# Patient Record
Sex: Male | Born: 1935
Health system: Southern US, Community
[De-identification: ages and names within clinical notes are randomized; demographics above are authoritative.]

## PROBLEM LIST (undated history)

## (undated) DIAGNOSIS — I495 Sick sinus syndrome: Secondary | ICD-10-CM

## (undated) DIAGNOSIS — R112 Nausea with vomiting, unspecified: Secondary | ICD-10-CM

## (undated) DIAGNOSIS — L02219 Cutaneous abscess of trunk, unspecified: Secondary | ICD-10-CM

## (undated) DIAGNOSIS — I10 Essential (primary) hypertension: Secondary | ICD-10-CM

## (undated) DIAGNOSIS — M1990 Unspecified osteoarthritis, unspecified site: Secondary | ICD-10-CM

## (undated) DIAGNOSIS — J309 Allergic rhinitis, unspecified: Secondary | ICD-10-CM

## (undated) DIAGNOSIS — K219 Gastro-esophageal reflux disease without esophagitis: Secondary | ICD-10-CM

## (undated) DIAGNOSIS — I4821 Permanent atrial fibrillation: Secondary | ICD-10-CM

## (undated) DIAGNOSIS — Z9889 Other specified postprocedural states: Secondary | ICD-10-CM

## (undated) DIAGNOSIS — D126 Benign neoplasm of colon, unspecified: Secondary | ICD-10-CM

## (undated) DIAGNOSIS — K5790 Diverticulosis of intestine, part unspecified, without perforation or abscess without bleeding: Secondary | ICD-10-CM

## (undated) DIAGNOSIS — E119 Type 2 diabetes mellitus without complications: Secondary | ICD-10-CM

## (undated) DIAGNOSIS — I2510 Atherosclerotic heart disease of native coronary artery without angina pectoris: Secondary | ICD-10-CM

## (undated) DIAGNOSIS — C4490 Unspecified malignant neoplasm of skin, unspecified: Secondary | ICD-10-CM

## (undated) DIAGNOSIS — E785 Hyperlipidemia, unspecified: Secondary | ICD-10-CM

## (undated) DIAGNOSIS — L03319 Cellulitis of trunk, unspecified: Secondary | ICD-10-CM

## (undated) DIAGNOSIS — I499 Cardiac arrhythmia, unspecified: Secondary | ICD-10-CM

## (undated) DIAGNOSIS — Z950 Presence of cardiac pacemaker: Secondary | ICD-10-CM

## (undated) DIAGNOSIS — L570 Actinic keratosis: Secondary | ICD-10-CM

## (undated) HISTORY — DX: Cutaneous abscess of trunk, unspecified: L02.219

## (undated) HISTORY — DX: Benign neoplasm of colon, unspecified: D12.6

## (undated) HISTORY — DX: Diverticulosis of intestine, part unspecified, without perforation or abscess without bleeding: K57.90

## (undated) HISTORY — DX: Type 2 diabetes mellitus without complications: E11.9

## (undated) HISTORY — DX: Permanent atrial fibrillation: I48.21

## (undated) HISTORY — DX: Allergic rhinitis, unspecified: J30.9

## (undated) HISTORY — DX: Essential (primary) hypertension: I10

## (undated) HISTORY — DX: Atherosclerotic heart disease of native coronary artery without angina pectoris: I25.10

## (undated) HISTORY — DX: Actinic keratosis: L57.0

## (undated) HISTORY — DX: Sick sinus syndrome: I49.5

## (undated) HISTORY — DX: Hyperlipidemia, unspecified: E78.5

## (undated) HISTORY — DX: Unspecified osteoarthritis, unspecified site: M19.90

## (undated) HISTORY — DX: Unspecified malignant neoplasm of skin, unspecified: C44.90

## (undated) HISTORY — PX: CYSTECTOMY: SUR359

## (undated) HISTORY — DX: Cellulitis of trunk, unspecified: L03.319

---

## 1997-11-15 ENCOUNTER — Encounter: Admission: RE | Admit: 1997-11-15 | Discharge: 1998-02-13 | Payer: Self-pay | Admitting: Neurosurgery

## 1998-06-04 HISTORY — PX: BACK SURGERY: SHX140

## 2002-11-12 ENCOUNTER — Encounter: Payer: Self-pay | Admitting: Cardiology

## 2002-11-12 ENCOUNTER — Ambulatory Visit (HOSPITAL_COMMUNITY): Admission: RE | Admit: 2002-11-12 | Discharge: 2002-11-13 | Payer: Self-pay | Admitting: Cardiology

## 2004-04-06 ENCOUNTER — Ambulatory Visit: Payer: Self-pay | Admitting: Internal Medicine

## 2004-04-21 ENCOUNTER — Ambulatory Visit: Payer: Self-pay | Admitting: Internal Medicine

## 2004-05-11 ENCOUNTER — Ambulatory Visit: Payer: Self-pay | Admitting: Internal Medicine

## 2004-06-07 ENCOUNTER — Ambulatory Visit: Payer: Self-pay | Admitting: Internal Medicine

## 2004-07-10 ENCOUNTER — Ambulatory Visit: Payer: Self-pay | Admitting: Internal Medicine

## 2004-08-03 ENCOUNTER — Ambulatory Visit: Payer: Self-pay | Admitting: Internal Medicine

## 2004-08-21 ENCOUNTER — Ambulatory Visit: Payer: Self-pay | Admitting: Internal Medicine

## 2004-09-21 ENCOUNTER — Ambulatory Visit: Payer: Self-pay | Admitting: Internal Medicine

## 2004-10-19 ENCOUNTER — Ambulatory Visit: Payer: Self-pay | Admitting: Internal Medicine

## 2004-11-20 ENCOUNTER — Ambulatory Visit: Payer: Self-pay | Admitting: Internal Medicine

## 2004-11-29 ENCOUNTER — Ambulatory Visit: Payer: Self-pay | Admitting: Internal Medicine

## 2004-12-19 ENCOUNTER — Ambulatory Visit: Payer: Self-pay | Admitting: Internal Medicine

## 2005-01-19 ENCOUNTER — Ambulatory Visit: Payer: Self-pay | Admitting: Internal Medicine

## 2005-02-12 ENCOUNTER — Ambulatory Visit: Payer: Self-pay | Admitting: Internal Medicine

## 2005-03-14 ENCOUNTER — Ambulatory Visit: Payer: Self-pay | Admitting: Internal Medicine

## 2005-04-16 ENCOUNTER — Ambulatory Visit: Payer: Self-pay | Admitting: Internal Medicine

## 2005-05-15 ENCOUNTER — Ambulatory Visit: Payer: Self-pay | Admitting: Internal Medicine

## 2005-06-18 ENCOUNTER — Ambulatory Visit: Payer: Self-pay | Admitting: Internal Medicine

## 2005-07-20 ENCOUNTER — Ambulatory Visit: Payer: Self-pay | Admitting: Internal Medicine

## 2005-08-22 ENCOUNTER — Ambulatory Visit: Payer: Self-pay | Admitting: Internal Medicine

## 2005-08-31 ENCOUNTER — Ambulatory Visit: Payer: Self-pay | Admitting: Internal Medicine

## 2005-09-18 ENCOUNTER — Ambulatory Visit: Payer: Self-pay | Admitting: Internal Medicine

## 2005-10-18 ENCOUNTER — Ambulatory Visit: Payer: Self-pay | Admitting: Internal Medicine

## 2005-11-05 ENCOUNTER — Ambulatory Visit: Payer: Self-pay | Admitting: Gastroenterology

## 2005-11-14 ENCOUNTER — Encounter (INDEPENDENT_AMBULATORY_CARE_PROVIDER_SITE_OTHER): Payer: Self-pay | Admitting: Specialist

## 2005-11-14 ENCOUNTER — Ambulatory Visit: Payer: Self-pay | Admitting: Gastroenterology

## 2005-11-15 ENCOUNTER — Ambulatory Visit: Payer: Self-pay | Admitting: Internal Medicine

## 2005-11-30 ENCOUNTER — Ambulatory Visit: Payer: Self-pay | Admitting: Internal Medicine

## 2005-12-31 ENCOUNTER — Ambulatory Visit: Payer: Self-pay | Admitting: Internal Medicine

## 2006-01-31 ENCOUNTER — Ambulatory Visit: Payer: Self-pay | Admitting: Internal Medicine

## 2006-02-28 ENCOUNTER — Ambulatory Visit (HOSPITAL_COMMUNITY): Admission: RE | Admit: 2006-02-28 | Discharge: 2006-02-28 | Payer: Self-pay | Admitting: Ophthalmology

## 2006-02-28 ENCOUNTER — Encounter: Payer: Self-pay | Admitting: Vascular Surgery

## 2006-03-04 ENCOUNTER — Ambulatory Visit: Payer: Self-pay | Admitting: Internal Medicine

## 2006-04-04 ENCOUNTER — Ambulatory Visit: Payer: Self-pay | Admitting: Internal Medicine

## 2006-05-30 ENCOUNTER — Ambulatory Visit: Payer: Self-pay | Admitting: Internal Medicine

## 2006-06-04 HISTORY — PX: LEAD REVISION: SHX5945

## 2006-06-04 HISTORY — PX: CORONARY ANGIOPLASTY WITH STENT PLACEMENT: SHX49

## 2006-06-04 HISTORY — PX: PACEMAKER INSERTION: SHX728

## 2006-07-01 ENCOUNTER — Ambulatory Visit: Payer: Self-pay | Admitting: Internal Medicine

## 2006-07-30 ENCOUNTER — Ambulatory Visit: Payer: Self-pay | Admitting: Internal Medicine

## 2006-08-29 ENCOUNTER — Ambulatory Visit: Payer: Self-pay | Admitting: Internal Medicine

## 2006-10-03 ENCOUNTER — Ambulatory Visit: Payer: Self-pay | Admitting: Internal Medicine

## 2006-11-07 ENCOUNTER — Ambulatory Visit: Payer: Self-pay | Admitting: Internal Medicine

## 2006-12-02 DIAGNOSIS — I10 Essential (primary) hypertension: Secondary | ICD-10-CM

## 2006-12-02 DIAGNOSIS — K219 Gastro-esophageal reflux disease without esophagitis: Secondary | ICD-10-CM | POA: Insufficient documentation

## 2006-12-02 DIAGNOSIS — J309 Allergic rhinitis, unspecified: Secondary | ICD-10-CM

## 2006-12-09 ENCOUNTER — Ambulatory Visit: Payer: Self-pay | Admitting: Internal Medicine

## 2007-01-09 ENCOUNTER — Ambulatory Visit: Payer: Self-pay | Admitting: Internal Medicine

## 2007-01-09 LAB — CONVERTED CEMR LAB: Prothrombin Time: 16.8 s

## 2007-02-11 ENCOUNTER — Ambulatory Visit: Payer: Self-pay | Admitting: Internal Medicine

## 2007-02-11 DIAGNOSIS — I4821 Permanent atrial fibrillation: Secondary | ICD-10-CM | POA: Insufficient documentation

## 2007-02-11 DIAGNOSIS — I4891 Unspecified atrial fibrillation: Secondary | ICD-10-CM

## 2007-02-24 ENCOUNTER — Ambulatory Visit: Payer: Self-pay | Admitting: Internal Medicine

## 2007-02-24 ENCOUNTER — Telehealth: Payer: Self-pay | Admitting: *Deleted

## 2007-02-24 DIAGNOSIS — R079 Chest pain, unspecified: Secondary | ICD-10-CM | POA: Insufficient documentation

## 2007-02-24 LAB — CONVERTED CEMR LAB: Prothrombin Time: 21.6 s

## 2007-03-04 ENCOUNTER — Encounter: Payer: Self-pay | Admitting: Internal Medicine

## 2007-03-04 ENCOUNTER — Ambulatory Visit: Payer: Self-pay | Admitting: Cardiology

## 2007-03-04 ENCOUNTER — Inpatient Hospital Stay (HOSPITAL_COMMUNITY): Admission: AD | Admit: 2007-03-04 | Discharge: 2007-03-17 | Payer: Self-pay | Admitting: Cardiology

## 2007-03-04 ENCOUNTER — Ambulatory Visit: Payer: Self-pay

## 2007-03-11 DIAGNOSIS — Z950 Presence of cardiac pacemaker: Secondary | ICD-10-CM | POA: Insufficient documentation

## 2007-03-12 ENCOUNTER — Encounter: Payer: Self-pay | Admitting: Cardiology

## 2007-03-20 ENCOUNTER — Ambulatory Visit: Payer: Self-pay | Admitting: Cardiology

## 2007-03-20 ENCOUNTER — Ambulatory Visit: Payer: Self-pay

## 2007-03-20 LAB — CONVERTED CEMR LAB
CO2: 29 meq/L (ref 19–32)
Chloride: 103 meq/L (ref 96–112)
Creatinine, Ser: 1.3 mg/dL (ref 0.4–1.5)
Eosinophils Absolute: 0.3 10*3/uL (ref 0.0–0.6)
Eosinophils Relative: 2.5 % (ref 0.0–5.0)
GFR calc non Af Amer: 58 mL/min
Glucose, Bld: 116 mg/dL — ABNORMAL HIGH (ref 70–99)
HCT: 36.4 % — ABNORMAL LOW (ref 39.0–52.0)
MCV: 90.5 fL (ref 78.0–100.0)
Neutrophils Relative %: 70.6 % (ref 43.0–77.0)
RBC: 4.03 M/uL — ABNORMAL LOW (ref 4.22–5.81)
Sodium: 139 meq/L (ref 135–145)
WBC: 10.6 10*3/uL — ABNORMAL HIGH (ref 4.5–10.5)

## 2007-03-21 ENCOUNTER — Observation Stay (HOSPITAL_COMMUNITY): Admission: RE | Admit: 2007-03-21 | Discharge: 2007-03-22 | Payer: Self-pay | Admitting: Internal Medicine

## 2007-03-21 ENCOUNTER — Ambulatory Visit: Payer: Self-pay | Admitting: Internal Medicine

## 2007-03-25 ENCOUNTER — Ambulatory Visit: Payer: Self-pay | Admitting: Cardiovascular Disease

## 2007-03-25 ENCOUNTER — Ambulatory Visit: Payer: Self-pay

## 2007-03-25 ENCOUNTER — Encounter: Payer: Self-pay | Admitting: Cardiology

## 2007-03-31 ENCOUNTER — Ambulatory Visit: Payer: Self-pay | Admitting: Cardiology

## 2007-03-31 ENCOUNTER — Encounter: Payer: Self-pay | Admitting: Cardiology

## 2007-04-16 ENCOUNTER — Ambulatory Visit: Payer: Self-pay | Admitting: Cardiology

## 2007-04-30 ENCOUNTER — Ambulatory Visit: Payer: Self-pay

## 2007-04-30 ENCOUNTER — Ambulatory Visit: Payer: Self-pay | Admitting: Cardiology

## 2007-04-30 ENCOUNTER — Encounter: Payer: Self-pay | Admitting: Cardiology

## 2007-05-14 ENCOUNTER — Ambulatory Visit: Payer: Self-pay | Admitting: Cardiovascular Disease

## 2007-05-15 ENCOUNTER — Telehealth: Payer: Self-pay | Admitting: Internal Medicine

## 2007-05-22 ENCOUNTER — Ambulatory Visit: Payer: Self-pay | Admitting: Cardiology

## 2007-06-02 ENCOUNTER — Ambulatory Visit: Payer: Self-pay | Admitting: Cardiology

## 2007-06-12 ENCOUNTER — Ambulatory Visit: Payer: Self-pay | Admitting: Cardiology

## 2007-06-23 ENCOUNTER — Ambulatory Visit: Payer: Self-pay | Admitting: Cardiology

## 2007-06-30 ENCOUNTER — Ambulatory Visit: Payer: Self-pay

## 2007-06-30 ENCOUNTER — Ambulatory Visit: Payer: Self-pay | Admitting: Cardiology

## 2007-06-30 ENCOUNTER — Encounter: Payer: Self-pay | Admitting: Cardiology

## 2007-07-09 ENCOUNTER — Ambulatory Visit: Payer: Self-pay | Admitting: Cardiology

## 2007-07-09 LAB — CONVERTED CEMR LAB
ALT: 21 units/L (ref 0–53)
Alkaline Phosphatase: 67 units/L (ref 39–117)
LDL Cholesterol: 43 mg/dL (ref 0–99)
Total CHOL/HDL Ratio: 3.2
Total Protein: 6.9 g/dL (ref 6.0–8.3)
VLDL: 21 mg/dL (ref 0–40)

## 2007-08-06 ENCOUNTER — Ambulatory Visit: Payer: Self-pay | Admitting: Internal Medicine

## 2007-09-03 ENCOUNTER — Ambulatory Visit: Payer: Self-pay | Admitting: Cardiology

## 2007-09-23 ENCOUNTER — Ambulatory Visit: Payer: Self-pay | Admitting: Cardiology

## 2007-10-01 ENCOUNTER — Ambulatory Visit: Payer: Self-pay | Admitting: Cardiology

## 2007-10-29 ENCOUNTER — Ambulatory Visit: Payer: Self-pay | Admitting: Cardiology

## 2007-11-19 ENCOUNTER — Ambulatory Visit: Payer: Self-pay | Admitting: Cardiology

## 2007-12-10 ENCOUNTER — Ambulatory Visit: Payer: Self-pay | Admitting: Cardiovascular Disease

## 2007-12-22 ENCOUNTER — Telehealth (INDEPENDENT_AMBULATORY_CARE_PROVIDER_SITE_OTHER): Payer: Self-pay | Admitting: *Deleted

## 2007-12-25 ENCOUNTER — Ambulatory Visit: Payer: Self-pay | Admitting: Cardiology

## 2008-01-07 ENCOUNTER — Ambulatory Visit: Payer: Self-pay | Admitting: Internal Medicine

## 2008-02-04 ENCOUNTER — Ambulatory Visit: Payer: Self-pay | Admitting: Cardiology

## 2008-03-08 ENCOUNTER — Ambulatory Visit: Payer: Self-pay | Admitting: Internal Medicine

## 2008-03-10 ENCOUNTER — Ambulatory Visit: Payer: Self-pay | Admitting: Internal Medicine

## 2008-03-10 DIAGNOSIS — E1169 Type 2 diabetes mellitus with other specified complication: Secondary | ICD-10-CM | POA: Insufficient documentation

## 2008-03-10 DIAGNOSIS — E785 Hyperlipidemia, unspecified: Secondary | ICD-10-CM

## 2008-03-10 DIAGNOSIS — T887XXA Unspecified adverse effect of drug or medicament, initial encounter: Secondary | ICD-10-CM

## 2008-03-10 LAB — CONVERTED CEMR LAB
AST: 23 units/L (ref 0–37)
Alkaline Phosphatase: 74 units/L (ref 39–117)
BUN: 14 mg/dL (ref 6–23)
Bilirubin, Direct: 0.2 mg/dL (ref 0.0–0.3)
Chloride: 109 meq/L (ref 96–112)
Eosinophils Absolute: 0.3 10*3/uL (ref 0.0–0.7)
Eosinophils Relative: 3.8 % (ref 0.0–5.0)
GFR calc Af Amer: 85 mL/min
GFR calc non Af Amer: 70 mL/min
HCT: 43.1 % (ref 39.0–52.0)
HDL: 34.5 mg/dL — ABNORMAL LOW (ref >39.0)
MCV: 91.9 fL (ref 78.0–100.0)
Monocytes Absolute: 0.6 10*3/uL (ref 0.1–1.0)
Monocytes Relative: 8.7 % (ref 3.0–12.0)
Neutrophils Relative %: 62.2 % (ref 43.0–77.0)
Platelets: 169 10*3/uL (ref 150–400)
Potassium: 4.6 meq/L (ref 3.5–5.1)
RDW: 12.9 % (ref 11.5–14.6)
Sodium: 148 meq/L — ABNORMAL HIGH (ref 135–145)
Total CHOL/HDL Ratio: 3.3
Triglycerides: 109 mg/dL (ref 0–149)
VLDL: 22 mg/dL (ref 0–40)
WBC: 7.1 10*3/uL (ref 4.5–10.5)

## 2008-03-15 ENCOUNTER — Ambulatory Visit: Payer: Self-pay | Admitting: Cardiology

## 2008-04-07 ENCOUNTER — Ambulatory Visit: Payer: Self-pay | Admitting: Internal Medicine

## 2008-04-07 ENCOUNTER — Ambulatory Visit: Payer: Self-pay | Admitting: Cardiology

## 2008-04-07 ENCOUNTER — Encounter: Payer: Self-pay | Admitting: Internal Medicine

## 2008-04-07 DIAGNOSIS — L919 Hypertrophic disorder of the skin, unspecified: Secondary | ICD-10-CM

## 2008-04-07 DIAGNOSIS — C44300 Unspecified malignant neoplasm of skin of unspecified part of face: Secondary | ICD-10-CM | POA: Insufficient documentation

## 2008-04-07 DIAGNOSIS — L570 Actinic keratosis: Secondary | ICD-10-CM

## 2008-04-07 DIAGNOSIS — Z85828 Personal history of other malignant neoplasm of skin: Secondary | ICD-10-CM

## 2008-04-07 DIAGNOSIS — L909 Atrophic disorder of skin, unspecified: Secondary | ICD-10-CM | POA: Insufficient documentation

## 2008-04-08 ENCOUNTER — Telehealth: Payer: Self-pay | Admitting: Internal Medicine

## 2008-05-05 ENCOUNTER — Ambulatory Visit: Payer: Self-pay | Admitting: Cardiology

## 2008-06-02 ENCOUNTER — Ambulatory Visit: Payer: Self-pay | Admitting: Cardiology

## 2008-06-30 ENCOUNTER — Encounter: Payer: Self-pay | Admitting: Cardiology

## 2008-06-30 ENCOUNTER — Ambulatory Visit: Payer: Self-pay | Admitting: Cardiology

## 2008-07-01 ENCOUNTER — Encounter: Payer: Self-pay | Admitting: Cardiology

## 2008-07-02 ENCOUNTER — Ambulatory Visit: Payer: Self-pay | Admitting: Cardiology

## 2008-07-02 LAB — CONVERTED CEMR LAB
AST: 19 units/L (ref 0–37)
Alkaline Phosphatase: 59 units/L (ref 39–117)
Basophils Relative: 0.2 % (ref 0.0–3.0)
Bilirubin, Direct: 0.2 mg/dL (ref 0.0–0.3)
Eosinophils Absolute: 0.3 10*3/uL (ref 0.0–0.7)
Eosinophils Relative: 3.5 % (ref 0.0–5.0)
HDL: 27.4 mg/dL — ABNORMAL LOW (ref >39.0)
LDL Cholesterol: 49 mg/dL (ref 0–99)
Lymphocytes Relative: 25.7 % (ref 12.0–46.0)
MCV: 91.5 fL (ref 78.0–100.0)
Monocytes Relative: 7.9 % (ref 3.0–12.0)
Neutrophils Relative %: 62.7 % (ref 43.0–77.0)
Platelets: 160 10*3/uL (ref 150–400)
RBC: 4.67 M/uL (ref 4.22–5.81)
Total CHOL/HDL Ratio: 3.6
Total Protein: 6.6 g/dL (ref 6.0–8.3)
VLDL: 23 mg/dL (ref 0–40)
WBC: 7.8 10*3/uL (ref 4.5–10.5)

## 2008-08-02 ENCOUNTER — Ambulatory Visit: Payer: Self-pay | Admitting: Cardiovascular Disease

## 2008-09-01 ENCOUNTER — Ambulatory Visit: Payer: Self-pay | Admitting: Cardiology

## 2008-09-10 ENCOUNTER — Ambulatory Visit: Payer: Self-pay | Admitting: Internal Medicine

## 2008-09-13 LAB — CONVERTED CEMR LAB
HDL: 30.1 mg/dL — ABNORMAL LOW (ref >39.00)
Total CHOL/HDL Ratio: 4

## 2008-09-27 ENCOUNTER — Ambulatory Visit: Payer: Self-pay | Admitting: Cardiology

## 2008-09-27 DIAGNOSIS — I2510 Atherosclerotic heart disease of native coronary artery without angina pectoris: Secondary | ICD-10-CM

## 2008-10-06 ENCOUNTER — Ambulatory Visit: Payer: Self-pay | Admitting: Cardiology

## 2008-11-03 ENCOUNTER — Ambulatory Visit: Payer: Self-pay | Admitting: Cardiology

## 2008-11-03 ENCOUNTER — Encounter: Payer: Self-pay | Admitting: *Deleted

## 2008-11-03 LAB — CONVERTED CEMR LAB
POC INR: 2.2
Protime: 18.1

## 2008-12-01 ENCOUNTER — Ambulatory Visit: Payer: Self-pay | Admitting: Cardiology

## 2008-12-01 LAB — CONVERTED CEMR LAB: POC INR: 3

## 2008-12-08 ENCOUNTER — Encounter: Payer: Self-pay | Admitting: *Deleted

## 2008-12-29 ENCOUNTER — Ambulatory Visit: Payer: Self-pay | Admitting: Cardiovascular Disease

## 2009-01-13 ENCOUNTER — Encounter: Payer: Self-pay | Admitting: Cardiology

## 2009-01-19 ENCOUNTER — Encounter (INDEPENDENT_AMBULATORY_CARE_PROVIDER_SITE_OTHER): Payer: Self-pay | Admitting: *Deleted

## 2009-01-26 ENCOUNTER — Ambulatory Visit: Payer: Self-pay | Admitting: Cardiovascular Disease

## 2009-02-23 ENCOUNTER — Ambulatory Visit: Payer: Self-pay | Admitting: Cardiology

## 2009-02-23 ENCOUNTER — Ambulatory Visit: Payer: Self-pay | Admitting: Cardiovascular Disease

## 2009-02-23 LAB — CONVERTED CEMR LAB: POC INR: 2.6

## 2009-03-16 ENCOUNTER — Ambulatory Visit: Payer: Self-pay | Admitting: Internal Medicine

## 2009-03-24 ENCOUNTER — Ambulatory Visit: Payer: Self-pay | Admitting: Cardiology

## 2009-03-24 ENCOUNTER — Ambulatory Visit: Payer: Self-pay | Admitting: Cardiovascular Disease

## 2009-03-24 LAB — CONVERTED CEMR LAB: INR: 2.7

## 2009-04-21 ENCOUNTER — Ambulatory Visit: Payer: Self-pay | Admitting: Internal Medicine

## 2009-04-21 LAB — CONVERTED CEMR LAB: POC INR: 2.1

## 2009-05-23 ENCOUNTER — Ambulatory Visit: Payer: Self-pay | Admitting: Cardiology

## 2009-05-23 LAB — CONVERTED CEMR LAB: POC INR: 2.3

## 2009-06-20 ENCOUNTER — Ambulatory Visit: Payer: Self-pay | Admitting: Cardiology

## 2009-06-20 ENCOUNTER — Encounter (INDEPENDENT_AMBULATORY_CARE_PROVIDER_SITE_OTHER): Payer: Self-pay | Admitting: Cardiology

## 2009-06-20 LAB — CONVERTED CEMR LAB: POC INR: 2.3

## 2009-07-18 ENCOUNTER — Ambulatory Visit: Payer: Self-pay | Admitting: Internal Medicine

## 2009-07-18 LAB — CONVERTED CEMR LAB: POC INR: 2

## 2009-08-15 ENCOUNTER — Ambulatory Visit: Payer: Self-pay | Admitting: Internal Medicine

## 2009-09-01 ENCOUNTER — Telehealth: Payer: Self-pay | Admitting: Internal Medicine

## 2009-09-05 ENCOUNTER — Ambulatory Visit: Payer: Self-pay | Admitting: Internal Medicine

## 2009-09-05 DIAGNOSIS — B356 Tinea cruris: Secondary | ICD-10-CM | POA: Insufficient documentation

## 2009-09-05 DIAGNOSIS — L02219 Cutaneous abscess of trunk, unspecified: Secondary | ICD-10-CM

## 2009-09-05 DIAGNOSIS — L03319 Cellulitis of trunk, unspecified: Secondary | ICD-10-CM

## 2009-09-12 ENCOUNTER — Ambulatory Visit: Payer: Self-pay | Admitting: Internal Medicine

## 2009-10-13 ENCOUNTER — Ambulatory Visit: Payer: Self-pay | Admitting: Cardiology

## 2009-10-13 ENCOUNTER — Ambulatory Visit: Payer: Self-pay | Admitting: Internal Medicine

## 2009-11-07 ENCOUNTER — Ambulatory Visit: Payer: Self-pay | Admitting: Internal Medicine

## 2009-11-15 ENCOUNTER — Ambulatory Visit: Payer: Self-pay | Admitting: Internal Medicine

## 2009-11-15 ENCOUNTER — Ambulatory Visit: Payer: Self-pay | Admitting: Cardiology

## 2009-11-15 LAB — CONVERTED CEMR LAB: POC INR: 2.3

## 2009-11-29 LAB — CONVERTED CEMR LAB
ALT: 21 units/L (ref 0–53)
Cholesterol: 113 mg/dL (ref 0–200)
HDL: 31.3 mg/dL — ABNORMAL LOW (ref >39.00)
Total Protein: 6.7 g/dL (ref 6.0–8.3)
VLDL: 18.6 mg/dL (ref 0.0–40.0)

## 2009-12-14 ENCOUNTER — Ambulatory Visit: Payer: Self-pay | Admitting: Cardiology

## 2009-12-14 LAB — CONVERTED CEMR LAB: POC INR: 2.6

## 2010-01-11 ENCOUNTER — Ambulatory Visit: Payer: Self-pay | Admitting: Cardiology

## 2010-01-11 LAB — CONVERTED CEMR LAB: POC INR: 2.8

## 2010-02-08 ENCOUNTER — Ambulatory Visit: Payer: Self-pay | Admitting: Cardiovascular Disease

## 2010-03-08 ENCOUNTER — Ambulatory Visit: Payer: Self-pay | Admitting: Cardiovascular Disease

## 2010-03-29 ENCOUNTER — Encounter: Payer: Self-pay | Admitting: Internal Medicine

## 2010-03-29 ENCOUNTER — Ambulatory Visit: Payer: Self-pay | Admitting: Cardiology

## 2010-03-30 ENCOUNTER — Telehealth (INDEPENDENT_AMBULATORY_CARE_PROVIDER_SITE_OTHER): Payer: Self-pay | Admitting: *Deleted

## 2010-04-03 ENCOUNTER — Encounter: Payer: Self-pay | Admitting: Cardiology

## 2010-04-05 ENCOUNTER — Ambulatory Visit: Payer: Self-pay | Admitting: Cardiology

## 2010-04-05 ENCOUNTER — Ambulatory Visit: Payer: Self-pay | Admitting: Internal Medicine

## 2010-05-03 ENCOUNTER — Ambulatory Visit: Payer: Self-pay | Admitting: Cardiology

## 2010-05-26 ENCOUNTER — Ambulatory Visit: Payer: Self-pay | Admitting: Cardiology

## 2010-05-26 ENCOUNTER — Ambulatory Visit: Payer: Self-pay | Admitting: Internal Medicine

## 2010-06-04 HISTORY — PX: CATARACT EXTRACTION W/ INTRAOCULAR LENS  IMPLANT, BILATERAL: SHX1307

## 2010-06-22 ENCOUNTER — Ambulatory Visit: Admission: RE | Admit: 2010-06-22 | Discharge: 2010-06-22 | Payer: Self-pay | Source: Home / Self Care

## 2010-07-03 ENCOUNTER — Telehealth: Payer: Self-pay | Admitting: Internal Medicine

## 2010-07-04 NOTE — Medication Information (Signed)
Summary: rov/eac  Anticoagulant Therapy  Managed by: Bethena Midget, RN, BSN Referring MD: Lewayne Bunting PCP: Stacie Glaze MD Supervising MD: Tenny Craw MD, Gunnar Fusi Indication 1: Atrial Fibrillation (ICD-427.31) Lab Used: [facility] Site: [facility] INR POC 2.1 INR RANGE 2.0-3.0  Dietary changes: no    Health status changes: no    Bleeding/hemorrhagic complications: no    Recent/future hospitalizations: no    Any changes in medication regimen? no    Recent/future dental: no  Any missed doses?: no       Is patient compliant with meds? yes       Allergies (verified): No Known Drug Allergies  Anticoagulation Management History:      The patient is taking warfarin and comes in today for a routine follow up visit.  Positive risk factors for bleeding include an age of 74 years or older.  The bleeding index is 'intermediate risk'.  Positive CHADS2 values include History of HTN.  Negative CHADS2 values include Age > 39 years old.  The start date was 03/11/2007.  His last INR was 2.7.  Anticoagulation responsible provider: Tenny Craw MD, Gunnar Fusi.  INR POC: 2.1.  Cuvette Lot#: [id number].  Exp: 10/2010.    Anticoagulation Management Assessment/Plan:      The patient's current anticoagulation dose is Coumadin 2.5 mg tabs: Use as directed by anticoagulation clinic..  The target INR is 2.0-3.0.  The next INR is due 09/12/2009.  Anticoagulation instructions were given to patient.  Results were reviewed/authorized by Bethena Midget, RN, BSN.  He was notified by Bethena Midget, RN, BSN.         Prior Anticoagulation Instructions: INR 2.0  Continue current dosing schedule.  Take 2 tablets on Monday, Wednesday and Friday, and take 1 tablet all other days. Return to clinic in 4 weeks.   Current Anticoagulation Instructions: INR 2.1 Continue 2.5mg s daily except 5mg s on Mondays, Wednesdays and Fridays. Recheck in 4 weeks.

## 2010-07-04 NOTE — Medication Information (Signed)
Summary: Shawn [name]  Anticoagulant Therapy  Managed by: Weston Brass, PharmD Referring MD: Lewayne Bunting PCP: Stacie Glaze MD Supervising MD: Johney Frame MD, Fayrene Fearing Indication 1: Atrial Fibrillation (ICD-427.31) Lab Used: [facility] Site: [facility] INR POC 2.7 INR RANGE 2.0-3.0  Dietary changes: no    Health status changes: no    Bleeding/hemorrhagic complications: no    Recent/future hospitalizations: no    Any changes in medication regimen? no    Recent/future dental: no  Any missed doses?: no       Is patient compliant with meds? yes       Allergies: No Known Drug Allergies  Anticoagulation Management History:      The patient is taking warfarin and comes in today for a routine follow up visit.  Positive risk factors for bleeding include an age of 74 years or older.  The bleeding index is 'intermediate risk'.  Positive CHADS2 values include History of HTN.  Negative CHADS2 values include Age > 74 years old.  The start date was 03/11/2007.  His last INR was 2.7.  Anticoagulation responsible provider: Aryam Zhan MD, Fayrene Fearing.  INR POC: 2.7.  Cuvette Lot#: [id number].  Exp: 01/2011.    Anticoagulation Management Assessment/Plan:      The patient's current anticoagulation dose is Coumadin 2.5 mg tabs: Use as directed by anticoagulation clinic..  The target INR is 2.0-3.0.  The next INR is due 11/15/2009.  Anticoagulation instructions were given to patient.  Results were reviewed/authorized by Weston Brass, PharmD.  He was notified by Weston Brass PharmD.         Prior Anticoagulation Instructions: INR 2.3  Continue on same dosage 1 tablet daily except 2 tablets on Mondays, Wednesdays, and Fridays.  Recheck in 4 weeks.    Current Anticoagulation Instructions: INR 2.7  Continue same dose of 1 tablet every day except 2 tablets on Monday, Wednesday and Friday

## 2010-07-04 NOTE — Progress Notes (Signed)
  Phone Note Other Incoming   Caller: Marijean Niemann Reason for Call: Get patient information Request: Send information Action Taken: Information Sent Summary of Call: Recieved a call from [facility] requesting the last ekg, stress test, echo and office note. All requested records were sent. Initial call taken by: Dixie Dials

## 2010-07-04 NOTE — Medication Information (Signed)
Summary: rov/sp  Anticoagulant Therapy  Managed by: Weston Brass, PharmD Referring MD: Lewayne Bunting PCP: Stacie Glaze MD Supervising MD: Shirlee Latch MD, Dalton Indication 1: Atrial Fibrillation (ICD-427.31) Lab Used: [facility] Site: [facility] INR POC 2.6 INR RANGE 2.0-3.0  Dietary changes: no    Health status changes: no    Bleeding/hemorrhagic complications: no    Recent/future hospitalizations: no    Any changes in medication regimen? no    Recent/future dental: no  Any missed doses?: no       Is patient compliant with meds? yes       Allergies: No Known Drug Allergies  Anticoagulation Management History:      The patient is taking warfarin and comes in today for a routine follow up visit.  Positive risk factors for bleeding include an age of 74 years or older.  The bleeding index is 'intermediate risk'.  Positive CHADS2 values include History of HTN.  Negative CHADS2 values include Age > 74 years old.  The start date was 03/11/2007.  His last INR was 2.7.  Anticoagulation responsible provider: Shirlee Latch MD, Dalton.  INR POC: 2.6.  Cuvette Lot#: [id number].  Exp: 02/2011.    Anticoagulation Management Assessment/Plan:      The patient's current anticoagulation dose is Coumadin 2.5 mg tabs: Use as directed by anticoagulation clinic..  The target INR is 2.0-3.0.  The next INR is due 01/11/2010.  Anticoagulation instructions were given to patient.  Results were reviewed/authorized by Weston Brass, PharmD.  He was notified by Dillard Cannon.         Prior Anticoagulation Instructions: INR 2.3  Continue same dose of 1 tablet every day except 2 tablets on Monday, Wednesday and Friday   Current Anticoagulation Instructions: INR 2.6  Continue same regimen of 2 tabs on Monday, Wednesday, and Friday and 1 tab on Sunday, Tuesday, Thursday, and Saturday.  Re-check INR in 4 weeks.

## 2010-07-04 NOTE — Assessment & Plan Note (Signed)
Summary: pt has pacer maker implanted by dr hochrien needs to est with...   Visit Type:  Follow-up Primary Provider:  Stacie Glaze MD   History of Present Illness: The patient presents today for routine pacemaker followup. He previously had a pacemaker implanted by Dr Antoine Poche for tachycardia- bradycardia syndrome.  He is followed by Dr Antoine Poche for his pacemaker and by Dr Riley Kill for routine cardiology.  He reports doing very well since last being seen in our clinic.  His exercise tolerance is preserved.  He plays golf and mowes his own lawn frequently.   The patient denies symptoms of palpitations, chest pain, shortness of breath, orthopnea, PND, lower extremity edema, dizziness, presyncope, syncope, or neurologic sequela. The patient is tolerating medications without difficulties and is otherwise without complaint today.   Current Medications (verified): 1)  Baby Aspirin 81 Mg  Chew (Aspirin) .... .qd 2)  Fluticasone Propionate 50 Mcg/act Susp (Fluticasone Propionate) .... Two Sprys in Each Nostril Daily 3)  Flomax 0.4 Mg  Cp24 (Tamsulosin Hcl) .... One Daily 4)  Metoprolol Succinate 50 Mg Xr24h-Tab (Metoprolol Succinate) .... Take 1 and 1/2 Tablet Daily 5)  Cardizem La 240 Mg Xr24h-Tab (Diltiazem Hcl Coated Beads) .Marland Kitchen.. 1 Once Daily 6)  Prilosec 20 Mg Cpdr (Omeprazole) .Marland Kitchen.. 1 Once Daily As Needed 7)  Multivitamins  Caps (Multiple Vitamin) .Marland Kitchen.. 1 Once Daily 8)  Clotrimazole-Betamethasone 1-0.05 % Crea (Clotrimazole-Betamethasone) .... Appply To Rash Prn 9)  Lipitor 20 Mg Tabs (Atorvastatin Calcium) .... Take 1 Tablet By Mouth Once A Day 10)  Coumadin 2.5 Mg Tabs (Warfarin Sodium) .... Use As Directed By Anticoagulation Clinic. 11)  Mobic 7.5 Mg Tabs (Meloxicam) .... Daily 12)  Nitrostat 0.4 Mg Subl (Nitroglycerin) .Marland Kitchen.. 1 Tablet Under Tongue At Onset of Chest Pain; You May Repeat Every 5 Minutes For Up To 3 Doses.  Allergies (verified): No Known Drug Allergies  Past History:  Past  Medical History: Allergic rhinitis GERD Hypertension BPH Hyperlipidemia Persistent (probably permanent) Atrial fibrillation Sick sinus syndrome with bradycardia S/P AV pacemaker implant CAD (LAD 70% stenosis, AV circumflex free of     disease, right coronary artery had a previous stent, 30-40%     narrowing followed by subtotal occlusion, 95% mid vessel stenosis.     The vessel was stented with a non-drug-eluting stent by Dr.     Riley Kill).  Past Surgical History: Reviewed history from 02/23/2009 and no changes required. Pacemaker implant with lead revision and microperforation. Prior back surgery Cyst removed from R face  Family History: Reviewed history from 12/02/2006 and no changes required. Family History Other cancer-Colon  Social History: Retired and lives in [place] Married Never Smoked Alcohol use-no 2 children  Review of Systems       All systems are reviewed and negative except as listed in the HPI.   Vital Signs:  Patient profile:   76 year old male Height:      68 inches Weight:      203 pounds BMI:     30.98 Pulse rate:   63 / minute BP sitting:   112 / 70  (left arm)  Vitals Entered By: Laurance Flatten CMA (November 07, 2009 2:41 PM)  Physical Exam  General:  Well developed, well nourished, in no acute distress. Head:  normocephalic and atraumatic Eyes:  PERRLA/EOM intact; conjunctiva and lids normal. Mouth:  Teeth, gums and palate normal. Oral mucosa normal. Neck:  supple,  JVP 8cm Chest Wall:  pacemaker site is well healed Lungs:  Clear bilaterally to auscultation and percussion. Heart:  PMI non displaced.  Normal S1 and S2.  Reg rhythm (paced).  No rub. Abdomen:  Bowel sounds positive; abdomen soft and non-tender without masses, organomegaly, or hernias noted. No hepatosplenomegaly. Msk:  Back normal, normal gait. Muscle strength and tone normal. Pulses:  pulses normal in all 4 extremities Extremities:  No clubbing or cyanosis. Neurologic:   Alert and oriented x 3. Skin:  Intact without lesions or rashes. Psych:  Normal affect.   PPM Specifications Following MD:  Rollene Rotunda, MD     PPM Vendor:  [employer]     PPM Model Number:  [id number]     PPM Serial Number:  [id number] PPM DOI:  03/11/2007     PPM Implanting MD:  Rollene Rotunda, MD  Lead 1    Location: [facility]     DOI: 03/11/2007     Model #: [id number]     Serial #: [id number]     Status: active Lead 2    Location: RV     DOI: 03/11/2007     Model #: [id number]     Serial #: [id number]     Status: active  Magnet Response Rate:  BOL 85 ERI 65  Indications:  TACHY BRADY SYNDROME   PPM Follow Up Remote Check?  No Battery Voltage:  2.97 V     Pacer Dependent:  Yes       PPM Device Measurements Atrium  Amplitude: 0.3 mV, Impedance: 480 ohms,  Right Ventricle  Amplitude: 4.9 mV, Impedance: 465 ohms, Threshold: 1.0 V at 0.4 msec  Episodes Percent Mode Switch:  98.4%     Coumadin:  Yes Ventricular High Rate:  6     Atrial Pacing:  1.6%     Ventricular Pacing:  39.6%  Parameters Mode:  DDIR     Lower Rate Limit:  60     Upper Rate Limit:  130 Paced AV Delay:  180     Sensed AV Delay:  150 Next Cardiology Appt Due:  05/04/2010 Tech Comments:  Atrial lead impedence variable, lifetime range 200-794 ohms.  Changed DDIR today because of persistant afib.  Pt prefers office visits to [facility].  ROV 6 months JH. Vella Kohler  November 07, 2009 2:54 PM  MD Comments:  agree,  heart rates appear controlled  Impression & Recommendations:  Problem # 1:  PACEMAKER, PERMANENT (ICD-V45.01) S/p PPM implanted by Dr Antoine Poche for tachycardia/ bradycardia syndrome.  He is followed by Dr Antoine Poche for his pacemaker.  His pacemaker function appears to be stable today.  Though his atrial lead impedance is labile, he appears to have permanent atrial fibrillation.  He is reprogrammed DDIR today.  Problem # 2:  ATRIAL FIBRILLATION (ICD-427.31) stable rate controlled continue coumadin for chronic  anticoagulation we discussed pradaxa as an alternative to coumadin today.  Given stable INRs, he wishes to continue coumadin at this time  Problem # 3:  CAD, NATIVE VESSEL (ICD-414.01) stable no changes today  Patient Instructions: 1)  Your physician recommends that you schedule a follow-up appointment in: [facility] IN 6 MONTHS

## 2010-07-04 NOTE — Medication Information (Signed)
Summary: rov/sp  Anticoagulant Therapy  Managed by: Weston Brass, PharmD Referring MD: Lewayne Bunting PCP: Stacie Glaze MD Supervising MD: Gala Romney MD, Reuel Boom Indication 1: Atrial Fibrillation (ICD-427.31) Lab Used: [facility] Site: [facility] INR POC 2.3 INR RANGE 2.0-3.0  Dietary changes: no    Health status changes: no    Bleeding/hemorrhagic complications: no    Recent/future hospitalizations: no    Any changes in medication regimen? no    Recent/future dental: no  Any missed doses?: no       Is patient compliant with meds? yes       Allergies: No Known Drug Allergies  Anticoagulation Management History:      The patient is taking warfarin and comes in today for a routine follow up visit.  Positive risk factors for bleeding include an age of 74 years or older.  The bleeding index is 'intermediate risk'.  Positive CHADS2 values include History of HTN.  Negative CHADS2 values include Age > 3 years old.  The start date was 03/11/2007.  His last INR was 2.7.  Anticoagulation responsible provider: Bensimhon MD, Reuel Boom.  INR POC: 2.3.  Cuvette Lot#: [id number].  Exp: 01/2011.    Anticoagulation Management Assessment/Plan:      The patient's current anticoagulation dose is Coumadin 2.5 mg tabs: Use as directed by anticoagulation clinic..  The target INR is 2.0-3.0.  The next INR is due 12/12/2009.  Anticoagulation instructions were given to patient.  Results were reviewed/authorized by Weston Brass, PharmD.  He was notified by Weston Brass PharmD.         Prior Anticoagulation Instructions: INR 2.7  Continue same dose of 1 tablet every day except 2 tablets on Monday, Wednesday and Friday   Current Anticoagulation Instructions: INR 2.3  Continue same dose of 1 tablet every day except 2 tablets on Monday, Wednesday and Friday

## 2010-07-04 NOTE — Medication Information (Signed)
Summary: rov/sel  Anticoagulant Therapy  Managed by: Weston Brass, PharmD Referring MD: Lewayne Bunting PCP: Stacie Glaze MD Supervising MD: nahser Indication 1: Atrial Fibrillation (ICD-427.31) Lab Used: [facility] Site: [facility] INR POC 2.4 INR RANGE 2.0-3.0  Dietary changes: no    Health status changes: no    Bleeding/hemorrhagic complications: no    Recent/future hospitalizations: no    Any changes in medication regimen? no    Recent/future dental: yes     Details: Cataract surgery on 31st  Any missed doses?: no       Is patient compliant with meds? yes       Allergies: No Known Drug Allergies  Anticoagulation Management History:      Positive risk factors for bleeding include an age of 70 years or older.  The bleeding index is 'intermediate risk'.  Positive CHADS2 values include History of HTN.  Negative CHADS2 values include Age > 17 years old.  The start date was 03/11/2007.  His last INR was 2.7.  Anticoagulation responsible provider: nahser.  INR POC: 2.4.  Cuvette Lot#: [id number].  Exp: 03/2011.    Anticoagulation Management Assessment/Plan:      The patient's current anticoagulation dose is Coumadin 2.5 mg tabs: Use as directed by anticoagulation clinic..  The target INR is 2.0-3.0.  The next INR is due 04/05/2010.  Anticoagulation instructions were given to patient.  Results were reviewed/authorized by Weston Brass, PharmD.  He was notified by Ilean Skill D candidate.         Prior Anticoagulation Instructions: INR 2.3  Continue taking 1 tablet every day except take 2 tablets on Mon, Wed, and Fri. Re-check INR in 4 weeks.   Current Anticoagulation Instructions: INR 2.4  Continue taking 1 tablet everyday except 2 tablets on Monday, Wednesday, and Friday. Recheck in 4 weeks.

## 2010-07-04 NOTE — Medication Information (Signed)
Summary: Shawn [name]  Anticoagulant Therapy  Managed by: Cloyde Reams, RN, BSN Referring MD: Lewayne Bunting PCP: Stacie Glaze MD Supervising MD: Tenny Craw MD, Gunnar Fusi Indication 1: Atrial Fibrillation (ICD-427.31) Lab Used: [facility] Site: [facility] INR POC 2.3 INR RANGE 2.0-3.0  Dietary changes: no    Health status changes: no    Bleeding/hemorrhagic complications: no    Recent/future hospitalizations: no    Any changes in medication regimen? no    Recent/future dental: no  Any missed doses?: no       Is patient compliant with meds? yes       Allergies (verified): No Known Drug Allergies  Anticoagulation Management History:      The patient is taking warfarin and comes in today for a routine follow up visit.  Positive risk factors for bleeding include an age of 74 years or older.  The bleeding index is 'intermediate risk'.  Positive CHADS2 values include History of HTN.  Negative CHADS2 values include Age > 39 years old.  The start date was 03/11/2007.  His last INR was 2.7.  Anticoagulation responsible provider: Tenny Craw MD, Gunnar Fusi.  INR POC: 2.3.  Cuvette Lot#: [id number].  Exp: 10/2010.    Anticoagulation Management Assessment/Plan:      The patient's current anticoagulation dose is Coumadin 2.5 mg tabs: Use as directed by anticoagulation clinic..  The target INR is 2.0-3.0.  The next INR is due 10/13/2009.  Anticoagulation instructions were given to patient.  Results were reviewed/authorized by Cloyde Reams, RN, BSN.  He was notified by Cloyde Reams RN.         Prior Anticoagulation Instructions: INR 2.1 Continue 2.5mg s daily except 5mg s on Mondays, Wednesdays and Fridays. Recheck in 4 weeks.   Current Anticoagulation Instructions: INR 2.3  Continue on same dosage 1 tablet daily except 2 tablets on Mondays, Wednesdays, and Fridays.  Recheck in 4 weeks.

## 2010-07-04 NOTE — Medication Information (Signed)
Summary: rov/tm  Anticoagulant Therapy  Managed by: Eda Keys, PharmD Referring MD: Lewayne Bunting PCP: Stacie Glaze MD Supervising MD: Shirlee Latch MD, Kenyata Guess Indication 1: Atrial Fibrillation (ICD-427.31) Lab Used: [facility] Site: [facility] INR POC 2.0 INR RANGE 2.0-3.0  Dietary changes: no    Health status changes: no    Bleeding/hemorrhagic complications: no    Recent/future hospitalizations: no    Any changes in medication regimen? no    Recent/future dental: no  Any missed doses?: no       Is patient compliant with meds? yes       Allergies: No Known Drug Allergies  Anticoagulation Management History:      The patient is taking warfarin and comes in today for a routine follow up visit.  Positive risk factors for bleeding include an age of 14 years or older.  The bleeding index is 'intermediate risk'.  Positive CHADS2 values include History of HTN.  Negative CHADS2 values include Age > 43 years old.  The start date was 03/11/2007.  His last INR was 2.7.  Anticoagulation responsible provider: Shirlee Latch MD, Mazal Ebey.  INR POC: 2.0.  Cuvette Lot#: [id number].  Exp: 09/2010.    Anticoagulation Management Assessment/Plan:      The patient's current anticoagulation dose is Coumadin 2.5 mg tabs: Use as directed by anticoagulation clinic..  The target INR is 2.0-3.0.  The next INR is due 08/15/2009.  Anticoagulation instructions were given to patient.  Results were reviewed/authorized by Eda Keys, PharmD.  He was notified by Eda Keys.         Prior Anticoagulation Instructions: INR 2.3  Continue 1 tab daily except 2 tabs each Monday, Wednesday, and Friday.   Recheck in 4 weeks.    Current Anticoagulation Instructions: INR 2.0  Continue current dosing schedule.  Take 2 tablets on Monday, Wednesday and Friday, and take 1 tablet all other days. Return to clinic in 4 weeks.

## 2010-07-04 NOTE — Medication Information (Signed)
Summary: rov/sel  Anticoagulant Therapy  Managed by: Weston Brass, PharmD Referring MD: Lewayne Bunting PCP: Stacie Glaze MD Supervising MD: Shirlee Latch MD, Dalton Indication 1: Atrial Fibrillation (ICD-427.31) Lab Used: [facility] Site: [facility] INR POC 2.4 INR RANGE 2.0-3.0  Dietary changes: no    Health status changes: yes       Details: catarac removed on 04/03/10  Bleeding/hemorrhagic complications: no    Recent/future hospitalizations: no    Any changes in medication regimen? no    Recent/future dental: no  Any missed doses?: no       Is patient compliant with meds? yes       Allergies: No Known Drug Allergies  Anticoagulation Management History:      The patient is taking warfarin and comes in today for a routine follow up visit.  Positive risk factors for bleeding include an age of 74 years or older.  The bleeding index is 'intermediate risk'.  Positive CHADS2 values include History of HTN.  Negative CHADS2 values include Age > 74 years old.  The start date was 03/11/2007.  His last INR was 2.7.  Anticoagulation responsible provider: Shirlee Latch MD, Dalton.  INR POC: 2.4.  Cuvette Lot#: [id number].  Exp: 04/2011.    Anticoagulation Management Assessment/Plan:      The patient's current anticoagulation dose is Coumadin 2.5 mg tabs: Use as directed by anticoagulation clinic..  The target INR is 2.0-3.0.  The next INR is due 05/03/2010.  Anticoagulation instructions were given to patient.  Results were reviewed/authorized by Weston Brass, PharmD.  He was notified by Hoy Register, PharmD Candidate.         Prior Anticoagulation Instructions: INR 2.4  Continue taking 1 tablet everyday except 2 tablets on Monday, Wednesday, and Friday. Recheck in 4 weeks.   Current Anticoagulation Instructions: INR 2.4  Continue previous dose of 1 tablet on Sunday, Tuesday, Thursday and Saturday and 2 tablets on Monday, Wednesday and Friday.  Recheck INR in 4 weeks.

## 2010-07-04 NOTE — Medication Information (Signed)
Summary: rov/jj  Anticoagulant Therapy  Managed by: Eda Keys, PharmD Referring MD: Lewayne Bunting PCP: Stacie Glaze MD Supervising MD: Eden Emms MD, Theron Arista Indication 1: Atrial Fibrillation (ICD-427.31) Lab Used: [facility] Site: [facility] INR POC 2.3 INR RANGE 2.0-3.0  Dietary changes: no    Health status changes: no    Bleeding/hemorrhagic complications: no    Recent/future hospitalizations: no    Any changes in medication regimen? no    Recent/future dental: no  Any missed doses?: no       Is patient compliant with meds? yes       Allergies: No Known Drug Allergies  Anticoagulation Management History:      The patient is taking warfarin and comes in today for a routine follow up visit.  Positive risk factors for bleeding include an age of 74 years or older.  The bleeding index is 'intermediate risk'.  Positive CHADS2 values include History of HTN.  Negative CHADS2 values include Age > 25 years old.  The start date was 03/11/2007.  His last INR was 2.7.  Anticoagulation responsible provider: Eden Emms MD, Theron Arista.  INR POC: 2.3.  Cuvette Lot#: [id number].  Exp: 03/2011.    Anticoagulation Management Assessment/Plan:      The patient's current anticoagulation dose is Coumadin 2.5 mg tabs: Use as directed by anticoagulation clinic..  The target INR is 2.0-3.0.  The next INR is due 03/08/2010.  Anticoagulation instructions were given to patient.  Results were reviewed/authorized by Eda Keys, PharmD.  He was notified by Harrel Carina, PharmD canidate.         Prior Anticoagulation Instructions: INR 2.8  Continue taking 1 tablet (2.5mg ) every day except take 2 tablets (5mg ) on Monday, Wednesday, and Friday. Recheck in 4 weeks.   Current Anticoagulation Instructions: INR 2.3  Continue taking 1 tablet every day except take 2 tablets on Mon, Wed, and Fri. Re-check INR in 4 weeks.

## 2010-07-04 NOTE — Letter (Signed)
Summary: [facility]   Imported By: Marylou Mccoy 04/17/2010 16:18:35  _____________________________________________________________________  External Attachment:    Type:   Image     Comment:   External Document

## 2010-07-04 NOTE — Assessment & Plan Note (Signed)
Summary: 6 month rov   Visit Type:  6 months follow up Primary Provider:  Stacie Glaze MD  CC:  No complains.  History of Present Illness: Overall doing well .  Planting a garden , mowing grass, playing golf.  Has a bad back.  Has had previous surgery.  Patient stable on coumdain.  Current Medications (verified): 1)  Baby Aspirin 81 Mg  Chew (Aspirin) .... .qd 2)  Fluticasone Propionate 50 Mcg/act Susp (Fluticasone Propionate) .... Two Sprys in Each Nostril Daily 3)  Flomax 0.4 Mg  Cp24 (Tamsulosin Hcl) .... One Daily 4)  Metoprolol Succinate 50 Mg Xr24h-Tab (Metoprolol Succinate) .... Take 1 and 1/2 Tablet Daily 5)  Cardizem La 240 Mg Xr24h-Tab (Diltiazem Hcl Coated Beads) .Marland Kitchen.. 1 Once Daily 6)  Prilosec 20 Mg Cpdr (Omeprazole) .Marland Kitchen.. 1 Once Daily As Needed 7)  Multivitamins  Caps (Multiple Vitamin) .Marland Kitchen.. 1 Once Daily 8)  Clotrimazole-Betamethasone 1-0.05 % Crea (Clotrimazole-Betamethasone) .... Appply To Rash Prn 9)  Lipitor 20 Mg Tabs (Atorvastatin Calcium) .... Take 1 Tablet By Mouth Once A Day 10)  Coumadin 2.5 Mg Tabs (Warfarin Sodium) .... Use As Directed By Anticoagulation Clinic. 11)  Mobic 7.5 Mg Tabs (Meloxicam) .... Daily  Allergies (verified): No Known Drug Allergies  Vital Signs:  Patient profile:   76 year old male Height:      68 inches Weight:      204 pounds BMI:     31.13 Pulse rate:   69 / minute Pulse rhythm:   irregular Resp:     18 per minute BP sitting:   100 / 74  (left arm) Cuff size:   large  Vitals Entered By: Vikki Ports (Oct 13, 2009 11:43 AM) CC: No complains Comments INR today 2.7   Physical Exam  General:  Well developed, well nourished, in no acute distress. Head:  normocephalic and atraumatic Eyes:  PERRLA/EOM intact; conjunctiva and lids normal. Lungs:  Clear bilaterally to auscultation and percussion. Heart:  PMI non displaced.  Normal S1 and S2.  Reg rhythm.  No rub. Abdomen:  Bowel sounds positive; abdomen soft and non-tender  without masses, organomegaly, or hernias noted. No hepatosplenomegaly. Pulses:  pulses normal in all 4 extremities Extremities:  No clubbing or cyanosis. trace edema. Neurologic:  Alert and oriented x 3.   EKG  Procedure date:  10/13/2009  Findings:      atrial fib baseline.  Paced rhtyhm.  Some native beats without acute changes.  PPM Specifications Following MD:  Rollene Rotunda, MD     PPM Vendor:  Med[employer]     PPM Model Number:  [id number]     PPM Serial Number:  [id number] PPM DOI:  03/11/2007     PPM Implanting MD:  Rollene Rotunda, MD  Lead 1    Location: [facility]     DOI: 03/11/2007     Model #: 4098     Serial #: [id number]     Status: active Lead 2    Location: [facility]     DOI: 03/11/2007     Model #: 6213     Serial #: [id number]     Status: active  Magnet Response Rate:  BOL 85 ERI 65  Indications:  TACHY BRADY SYNDROME   PPM Follow Up Pacer Dependent:  Yes      Episodes Coumadin:  Yes  Parameters Mode:  DDD+     Lower Rate Limit:  60     Upper Rate Limit:  130 Paced AV Delay:  180     Sensed AV Delay:  150  Impression & Recommendations:  Problem # 1:  CAD, NATIVE VESSEL (ICD-414.01)  stable at present.  No recurrent symptoms.  Very active. His updated medication list for this problem includes:    Baby Aspirin 81 Mg Chew (Aspirin) ..... Marland Kitchenqd    Metoprolol Succinate 50 Mg Xr24h-tab (Metoprolol succinate) .Marland Kitchen... Take 1 and 1/2 tablet daily    Cardizem La 240 Mg Xr24h-tab (Diltiazem hcl coated beads) .Marland Kitchen... 1 once daily    Coumadin 2.5 Mg Tabs (Warfarin sodium) ..... Use as directed by anticoagulation clinic.    Nitrostat 0.4 Mg Subl (Nitroglycerin) .Marland Kitchen... 1 tablet under tongue at onset of chest pain; you may repeat every 5 minutes for up to 3 doses.  His updated medication list for this problem includes:    Baby Aspirin 81 Mg Chew (Aspirin) ..... Marland Kitchenqd    Metoprolol Succinate 50 Mg Xr24h-tab (Metoprolol succinate) .Marland Kitchen... Take 1 and 1/2 tablet daily    Cardizem La 240 Mg Xr24h-tab  (Diltiazem hcl coated beads) .Marland Kitchen... 1 once daily    Coumadin 2.5 Mg Tabs (Warfarin sodium) ..... Use as directed by anticoagulation clinic.  Orders: EKG w/ Interpretation (93000) EP Referral (Cardiology EP Ref )  Problem # 2:  PACEMAKER, PERMANENT (ICD-V45.01) Needs EP followup.  Problem # 3:  ATRIAL FIBRILLATION (ICD-427.31)  baseline with pacing.  On warfarin.  His updated medication list for this problem includes:    Baby Aspirin 81 Mg Chew (Aspirin) ..... Marland Kitchenqd    Metoprolol Succinate 50 Mg Xr24h-tab (Metoprolol succinate) .Marland Kitchen... Take 1 and 1/2 tablet daily    Coumadin 2.5 Mg Tabs (Warfarin sodium) ..... Use as directed by anticoagulation clinic.  His updated medication list for this problem includes:    Baby Aspirin 81 Mg Chew (Aspirin) ..... Marland Kitchenqd    Metoprolol Succinate 50 Mg Xr24h-tab (Metoprolol succinate) .Marland Kitchen... Take 1 and 1/2 tablet daily    Coumadin 2.5 Mg Tabs (Warfarin sodium) ..... Use as directed by anticoagulation clinic.  Orders: EKG w/ Interpretation (93000) EP Referral (Cardiology EP Ref )  Problem # 4:  HYPERLIPIDEMIA (ICD-272.4)  overdue for lipid and liver profile. His updated medication list for this problem includes:    Lipitor 20 Mg Tabs (Atorvastatin calcium) .Marland Kitchen... Take 1 tablet by mouth once a day  His updated medication list for this problem includes:    Lipitor 20 Mg Tabs (Atorvastatin calcium) .Marland Kitchen... Take 1 tablet by mouth once a day  Patient Instructions: 1)  Your physician recommends that you return for a FASTING LIPID and LIVER Profile (414.01, 427.31, 272.0)--Nothing to eat or drink after midnight  2)  Your physician recommends that you continue on your current medications as directed. Please refer to the Current Medication list given to you today. 3)  Your physician wants you to follow-up in:  1 YEAR.  You will receive a reminder letter in the mail two months in advance. If you don't receive a letter, please call our office to schedule the follow-up  appointment. 4)  You have been referred to EP for Pacemaker follow-up.   Prescriptions: NITROSTAT 0.4 MG SUBL (NITROGLYCERIN) 1 tablet under tongue at onset of chest pain; you may repeat every 5 minutes for up to 3 doses.  #25 x 2   Entered by:   Julieta Gutting, RN, BSN   Authorized by:   Ronaldo Miyamoto, MD, [facility]   Signed by:   Julieta Gutting, RN, BSN on 10/13/2009   Method used:  Electronically to        [facility] [phone]* (retail)       [address]       [place], [place]  [address]       Ph: [phone] or [phone]       Fax: [phone]   RxID:   [id number]

## 2010-07-04 NOTE — Letter (Signed)
Summary: Handout Printed  Printed Handout:  - Coumadin Instructions-w/out Meds 

## 2010-07-04 NOTE — Cardiovascular Report (Signed)
Summary: [facility]  [facility]   Imported By: Kassie Mends 06/16/2009 12:01:47  _____________________________________________________________________  External Attachment:    Type:   Image     Comment:   External Document

## 2010-07-04 NOTE — Medication Information (Signed)
Summary: rov/nb  Anticoagulant Therapy  Managed by: Weston Brass, PharmD Referring MD: Lewayne Bunting PCP: Stacie Glaze MD Supervising MD: Shirlee Latch MD, Dalton Indication 1: Atrial Fibrillation (ICD-427.31) Lab Used: [facility]  Site: [facility] INR POC 2.6 INR RANGE 2.0-3.0  Dietary changes: no    Health status changes: no    Bleeding/hemorrhagic complications: no    Recent/future hospitalizations: no    Any changes in medication regimen? no    Recent/future dental: no  Any missed doses?: no       Is patient compliant with meds? yes       Allergies: No Known Drug Allergies  Anticoagulation Management History:      The patient is taking warfarin and comes in today for a routine follow up visit.  Positive risk factors for bleeding include an age of 74 years or older.  The bleeding index is 'intermediate risk'.  Positive CHADS2 values include History of HTN.  Negative CHADS2 values include Age > 74 years old.  The start date was 03/11/2007.  His last INR was 2.7.  Anticoagulation responsible provider: Shirlee Latch MD, [place].  INR POC: 2.6.  Cuvette Lot#: [id number].  Exp: 05/2011.    Anticoagulation Management Assessment/Plan:      The patient's current anticoagulation dose is Coumadin 2.5 mg tabs: Use as directed by anticoagulation clinic..  The target INR is 2.0-3.0.  The next INR is due 05/26/2010.  Anticoagulation instructions were given to patient.  Results were reviewed/authorized by Weston Brass, PharmD.  He was notified by Hoy Register, PharmD Candidate.         Prior Anticoagulation Instructions: INR 2.4  Continue previous dose of 1 tablet on Sunday, Tuesday, Thursday and Saturday and 2 tablets on Monday, Wednesday and Friday.  Recheck INR in 4 weeks.   Current Anticoagulation Instructions: INR 2.6 Contine previous dose of 1 tablet everyday except 2 tablets on Monday, Wednesday, and Friday Recheck INR in 4 weeks

## 2010-07-04 NOTE — Procedures (Signed)
Summary: pacer check/medtronic   Current Medications (verified): 1)  Baby Aspirin 81 Mg  Chew (Aspirin) .... .qd 2)  Fluticasone Propionate 50 Mcg/act Susp (Fluticasone Propionate) .... Two Sprys in Each Nostril Daily 3)  Flomax 0.4 Mg  Cp24 (Tamsulosin Hcl) .... One Daily 4)  Metoprolol Succinate 50 Mg Xr24h-Tab (Metoprolol Succinate) .... Take 1 and 1/2 Tablet Daily 5)  Cardizem La 240 Mg Xr24h-Tab (Diltiazem Hcl Coated Beads) .Marland Kitchen.. 1 Once Daily 6)  Prilosec 20 Mg Cpdr (Omeprazole) .Marland Kitchen.. 1 Once Daily As Needed 7)  Multivitamins  Caps (Multiple Vitamin) .Marland Kitchen.. 1 Once Daily 8)  Clotrimazole-Betamethasone 1-0.05 % Crea (Clotrimazole-Betamethasone) .... Appply To Rash Prn 9)  Lipitor 20 Mg Tabs (Atorvastatin Calcium) .... Take 1 Tablet By Mouth Once A Day 10)  Coumadin 2.5 Mg Tabs (Warfarin Sodium) .... Use As Directed By Anticoagulation Clinic. 11)  Mobic 7.5 Mg Tabs (Meloxicam) .... Daily 12)  Nitrostat 0.4 Mg Subl (Nitroglycerin) .Marland Kitchen.. 1 Tablet Under Tongue At Onset of Chest Pain; You May Repeat Every 5 Minutes For Up To 3 Doses.  Allergies (verified): No Known Drug Allergies  PPM Specifications Following MD:  Rollene Rotunda, MD     PPM Vendor:  [employer]     PPM Model Number:  [id number]     PPM Serial Number:  [id number] PPM DOI:  03/11/2007     PPM Implanting MD:  Rollene Rotunda, MD  Lead 1    Location: [facility]     DOI: 03/11/2007     Model #: 4098     Serial #: [id number]     Status: active Lead 2    Location: [facility]     DOI: 03/11/2007     Model #: 6213     Serial #: [id number]     Status: active  Magnet Response Rate:  BOL 85 ERI 65  Indications:  TACHY BRADY SYNDROME   PPM Follow Up Remote Check?  No Battery Voltage:  2.97 V     Pacer Dependent:  No       PPM Device Measurements Atrium  Amplitude: 1.3 mV, Impedance: 448 ohms,  Right Ventricle  Amplitude: 4.2 mV, Impedance: 440 ohms, Threshold: 1.0 V at 0.4 msec  Episodes MS Episodes:  1     Percent Mode Switch:  100%     Coumadin:   Yes Atrial Pacing:  3.5%     Ventricular Pacing:  49%  Parameters Mode:  DDIR     Lower Rate Limit:  60     Upper Rate Limit:  130 Paced AV Delay:  180     Sensed AV Delay:  150 Next Cardiology Appt Due:  05/26/2010 Tech Comments:  No parameter changes.  Device function normal.  A-fib, + coumadin.  He was seen today for pre - cataract surgery scheduled for 04/03/10. Faxed to Soy[facility]   [phone].   Follow up scheduled with Dr. Johney Frame 05/26/10. Altha Harm, LPN  March 29, 2010 2:35 PM

## 2010-07-04 NOTE — Assessment & Plan Note (Signed)
Summary: flu shot/njr pt in route/ok per bonnie/njr  Nurse Visit   Allergies: No Known Drug Allergies  Review of Systems       Flu Vaccine Consent Questions     Do you have a history of severe allergic reactions to this vaccine? no    Any prior history of allergic reactions to egg and/or gelatin? no    Do you have a sensitivity to the preservative Thimersol? no    Do you have a past history of Guillan-Barre Syndrome? no    Do you currently have an acute febrile illness? no    Have you ever had a severe reaction to latex? no    Vaccine information given and explained to patient? yes    Are you currently pregnant? no    Lot Number:[id number]   Exp Date:12/02/2010   Site Given  Left Deltoid IM    Orders Added: 1)  Flu Vaccine 6yrs + MEDICARE PATIENTS [Q2039] 2)  Administration Flu vaccine - MCR [G0008]

## 2010-07-04 NOTE — Medication Information (Signed)
Summary: rov.mlw  Anticoagulant Therapy  Managed by: Shelby Dubin, PharmD, BCPS, CPP Referring MD: Lewayne Bunting PCP: Stacie Glaze MD Supervising MD: Jens Som MD, Arlys John Indication 1: Atrial Fibrillation (ICD-427.31) Lab Used: [facility] Site: [facility] INR POC 2.3 INR RANGE 2.0-3.0  Dietary changes: no    Health status changes: no    Bleeding/hemorrhagic complications: no    Recent/future hospitalizations: no    Any changes in medication regimen? no    Recent/future dental: no  Any missed doses?: no       Is patient compliant with meds? yes       Current Medications (verified): 1)  Baby Aspirin 81 Mg  Chew (Aspirin) .... .qd 2)  Fluticasone Propionate 50 Mcg/act Susp (Fluticasone Propionate) .... Two Sprys in Each Nostril Daily 3)  Flomax 0.4 Mg  Cp24 (Tamsulosin Hcl) .... One Daily 4)  Metoprolol Succinate 50 Mg Xr24h-Tab (Metoprolol Succinate) .... Take 1 and 1/2 Tablet Daily 5)  Cardizem La 240 Mg Xr24h-Tab (Diltiazem Hcl Coated Beads) .Marland Kitchen.. 1 Once Daily 6)  Prilosec 20 Mg Cpdr (Omeprazole) .Marland Kitchen.. 1 Once Daily As Needed 7)  Multivitamins  Caps (Multiple Vitamin) .Marland Kitchen.. 1 Once Daily 8)  Clotrimazole-Betamethasone 1-0.05 % Crea (Clotrimazole-Betamethasone) .... Appply To Rash Prn 9)  Lipitor 20 Mg Tabs (Atorvastatin Calcium) .... Take 1 Tablet By Mouth Once A Day 10)  Coumadin 2.5 Mg Tabs (Warfarin Sodium) .... Use As Directed By Anticoagulation Clinic. 11)  Mobic 7.5 Mg Tabs (Meloxicam) .... Daily  Allergies (verified): No Known Drug Allergies  Anticoagulation Management History:      The patient is taking warfarin and comes in today for a routine follow up visit.  Positive risk factors for bleeding include an age of 74 years or older.  The bleeding index is 'intermediate risk'.  Positive CHADS2 values include History of HTN.  Negative CHADS2 values include Age > 74 years old.  The start date was 03/11/2007.  His last INR was 2.7.  Anticoagulation responsible provider:  Jens Som MD, Arlys John.  INR POC: 2.3.  Cuvette Lot#: [id number].  Exp: 09/2010.    Anticoagulation Management Assessment/Plan:      The patient's current anticoagulation dose is Coumadin 2.5 mg tabs: Use as directed by anticoagulation clinic..  The target INR is 2.0-3.0.  The next INR is due 07/18/2009.  Anticoagulation instructions were given to patient.  Results were reviewed/authorized by Shelby Dubin, PharmD, BCPS, CPP.  He was notified by Shelby Dubin PharmD, BCPS, CPP.         Prior Anticoagulation Instructions: INR 2.3   Continue same regimen of 2 tabs (5 mg) on Mon, Wed, and Fri and 1 tab (2.5 mg) on Tues, Thurs, Sat, and Sun.   Check in 4 weeks.     Current Anticoagulation Instructions: INR 2.3  Continue 1 tab daily except 2 tabs each Monday, Wednesday, and Friday.   Recheck in 4 weeks.

## 2010-07-04 NOTE — Progress Notes (Signed)
Summary: hernia check  Phone Note Call from Patient Call back at Home Phone [phone] Call back at [phone]   Reason for Call: Acute Illness Summary of Call: wANTS APPT TOM OR mON for hernia navel belt area.  Has a little fever & is a little red.  Is a little sore.  No pain, stings certain way he moves.  Hasn't checked temperature.  Noticed it on Fri.  New & getting more swollen & red each day since Mon. (?confusion) Only bothers him when he belts pants or moves.  1 1/2 -2 by 1. Initial call taken by: Rudy Jew, RN,  September 01, 2009 10:44 AM  Follow-up for Phone Call        please give him an ov in the same day spot on monday-thanks Follow-up by: Willy Eddy, LPN,  September 01, 2009 11:27 AM  Additional Follow-up for Phone Call Additional follow up Details #1::        I tried called pt, but there was no answer at home number and no vm. I will try again later.  Additional Follow-up by: Lucy Antigua,  September 01, 2009 12:01 PM    Additional Follow-up for Phone Call Additional follow up Details #2::     appt 09-05-2009 315pm Follow-up by: Heron Sabins,  September 01, 2009 12:25 PM

## 2010-07-04 NOTE — Cardiovascular Report (Signed)
Summary: Office Visit   Office Visit   Imported By: Roderic Ovens 04/07/2010 09:46:43  _____________________________________________________________________  External Attachment:    Type:   Image     Comment:   External Document

## 2010-07-04 NOTE — Assessment & Plan Note (Signed)
Summary: ?hernia/njr   Vital Signs:  Patient profile:   76 year old male Height:      68 inches Weight:      202 pounds BMI:     30.83 Temp:     98.2 degrees F oral Pulse rate:   72 / minute Resp:     14 per minute BP sitting:   140 / 80  (left arm)  Vitals Entered By: Willy Eddy, LPN (September 06, 2954 3:40 PM) CC: c/o possible inguinal hernia with stinging at times   Primary Care Provider:  Stacie Glaze MD  CC:  c/o possible inguinal hernia with stinging at times.  History of Present Illness: pt has a mass on abd wall that he though might be a hernia. 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  (ICD-173.3) 4)  Actinic Keratosis  (ICD-702.0) 5)  Skin Tag  (ICD-701.9) 6)  Uns Advrs Eff Uns Rx Medicinal&biological Sbstnc  (ICD-995.20) 7)  Hyperlipidemia  (ICD-272.4) 8)  Chest Pain, Acute  (ICD-786.50) 9)  Atrial Fibrillation  (ICD-427.31) 10)  Hypertension  (ICD-401.9) 11)  Gerd  (ICD-530.81) 12)  Allergic Rhinitis  (ICD-477.9)  Current Problems (verified): 1)  Pacemaker, Permanent  (ICD-V45.01) 2)  Cad, Native Vessel  (ICD-414.01) 3)  Oth Malig Neoplasm Skin Oth&unspec Parts Face  (ICD-173.3) 4)  Actinic Keratosis  (ICD-702.0) 5)  Skin Tag  (ICD-701.9) 6)  Uns Advrs Eff Uns Rx Medicinal&biological Sbstnc  (ICD-995.20) 7)  Hyperlipidemia  (ICD-272.4) 8)  Chest Pain, Acute  (ICD-786.50) 9)  Atrial Fibrillation  (ICD-427.31) 10)  Hypertension  (ICD-401.9) 11)  Gerd  (ICD-530.81) 12)  Allergic Rhinitis  (ICD-477.9)  Medications Prior to Update: 1)  Baby Aspirin 81 Mg  Chew (Aspirin) .... .qd 2)   Fluticasone Propionate 50 Mcg/act Susp (Fluticasone Propionate) .... Two Sprys in Each Nostril Daily 3)  Flomax 0.4 Mg  Cp24 (Tamsulosin Hcl) .... One Daily 4)  Metoprolol Succinate 50 Mg Xr24h-Tab (Metoprolol Succinate) .... Take 1 and 1/2 Tablet Daily 5)  Cardizem La 240 Mg Xr24h-Tab (Diltiazem Hcl Coated Beads) .Marland Kitchen.. 1 Once Daily 6)  Prilosec 20 Mg Cpdr (Omeprazole) .Marland Kitchen.. 1 Once Daily As Needed 7)  Multivitamins  Caps (Multiple Vitamin) .Marland Kitchen.. 1 Once Daily 8)  Clotrimazole-Betamethasone 1-0.05 % Crea (Clotrimazole-Betamethasone) .... Appply To Rash Prn 9)  Lipitor 20 Mg Tabs (Atorvastatin Calcium) .... Take 1 Tablet By Mouth Once A Day 10)  Coumadin 2.5 Mg Tabs (Warfarin Sodium) .... Use As Directed By Anticoagulation Clinic. 11)  Mobic 7.5 Mg Tabs (Meloxicam) .... Daily  Current Medications (verified): 1)  Baby Aspirin 81 Mg  Chew (Aspirin) .... .qd 2)  Fluticasone Propionate 50 Mcg/act Susp (Fluticasone Propionate) .... Two Sprys in Each Nostril Daily 3)  Flomax 0.4 Mg  Cp24 (Tamsulosin Hcl) .... One Daily 4)  Metoprolol Succinate 50 Mg Xr24h-Tab (Metoprolol Succinate) .... Take 1 and 1/2 Tablet Daily 5)  Cardizem La 240 Mg Xr24h-Tab (Diltiazem Hcl Coated Beads) .Marland Kitchen.. 1 Once Daily 6)  Prilosec 20 Mg Cpdr (Omeprazole) .Marland Kitchen.. 1 Once Daily As Needed 7)  Multivitamins  Caps (Multiple Vitamin) .Marland Kitchen.. 1 Once Daily  8)  Clotrimazole-Betamethasone 1-0.05 % Crea (Clotrimazole-Betamethasone) .... Appply To Rash Prn 9)  Lipitor 20 Mg Tabs (Atorvastatin Calcium) .... Take 1 Tablet By Mouth Once A Day 10)  Coumadin 2.5 Mg Tabs (Warfarin Sodium) .... Use As Directed By Anticoagulation Clinic. 11)  Mobic 7.5 Mg Tabs (Meloxicam) .... Daily 12)  Cephalexin 500 Mg Tabs (Cephalexin) .... One Po Four Times A Day For 7 Days  Allergies (verified): No Known Drug Allergies  Past History:  Family History: Last updated: 12/02/2006 Family History Other cancer-Colon  Social History: Last updated:  07/04/2008 Retired Married Never Smoked Alcohol use-no 2 children  Risk Factors: Smoking Status: never (09/05/2009)  Past medical, surgical, family and social histories (including risk factors) reviewed, and no changes noted (except as noted below).  Past Medical History: Reviewed history from 02/23/2009 and no changes required. Allergic rhinitis GERD Hypertension BPH Hyperlipidemia Atrial fibrillation Sick sinus syndrome with bradycardia S/P AV pacemaker implant CAD (LAD 70% stenosis, AV circumflex free of     disease, right coronary artery had a previous stent, 30-40%     narrowing followed by subtotal occlusion, 95% mid vessel stenosis.     The vessel was stented with a non-drug-eluting stent by Dr.     Riley Kill).  Past Surgical History: Reviewed history from 02/23/2009 and no changes required. Pacemaker implant with lead revision and microperforation. Prior back surgery Cyst removed from R face  Family History: Reviewed history from 12/02/2006 and no changes required. Family History Other cancer-Colon  Social History: Reviewed history from 07/04/2008 and no changes required. Retired Married Never Smoked Alcohol use-no 2 children  Review of Systems  The patient denies anorexia, fever, weight loss, weight gain, vision loss, decreased hearing, hoarseness, chest pain, syncope, dyspnea on exertion, peripheral edema, prolonged cough, headaches, hemoptysis, abdominal pain, melena, hematochezia, severe indigestion/heartburn, hematuria, incontinence, genital sores, muscle weakness, suspicious skin lesions, transient blindness, difficulty walking, depression, unusual weight change, abnormal bleeding, enlarged lymph nodes, angioedema, and breast masses.    Physical Exam  General:  Well developed, well nourished, in no acute distress. Head:  atraumatic and male-pattern balding.   Eyes:  pupils equal and pupils round.   Ears:  R ear normal and L ear normal.   Mouth:   Teeth, gums and palate normal. Oral mucosa normal. Neck:  Neck supple, no JVD. No masses, thyromegaly or abnormal cervical nodes. Lungs:  Clear bilaterally to auscultation and percussion. Heart:  Irregularly, irregular.  No murmur.   Abdomen:  Bowel sounds positive; abdomen soft and 3cm cyst at base of right abdominla wall tender, red with crusting over surfacewithout masses, organomegaly, or hernias noted. No hepatosplenomegaly. Skin:  tinea cruis Cervical Nodes:  No lymphadenopathy noted Axillary Nodes:  No palpable lymphadenopathy   Impression & Recommendations:  Problem # 1:  CELLULITIS AND ABSCESS OF TRUNK (ICD-682.2) Assessment New  warm compresses and take all antibiotic consider lancing if enlarges His updated medication list for this problem includes:    Cephalexin 500 Mg Tabs (Cephalexin) ..... One po four times a day for 7 days  Elevate affected area. Warm moist compresses for 20 minutes every 2 hours while awake. Take antibiotics as directed and take acetaminophen as needed. To be seen in 48-72 hours if no improvement, sooner if worse.  Problem # 2:  TINEA CRURIS (ICD-110.3) Assessment: New lotrimen ultra to site two times a day  Complete Medication List: 1)  Baby Aspirin 81 Mg Chew (Aspirin) .... .qd 2)  Fluticasone Propionate 50 Mcg/act Susp (Fluticasone propionate) .Marland KitchenMarland KitchenMarland Kitchen  Two sprys in each nostril daily 3)  Flomax 0.4 Mg Cp24 (Tamsulosin hcl) .... One daily 4)  Metoprolol Succinate 50 Mg Xr24h-tab (Metoprolol succinate) .... Take 1 and 1/2 tablet daily 5)  Cardizem La 240 Mg Xr24h-tab (Diltiazem hcl coated beads) .Marland Kitchen.. 1 once daily 6)  Prilosec 20 Mg Cpdr (Omeprazole) .Marland Kitchen.. 1 once daily as needed 7)  Multivitamins Caps (Multiple vitamin) .Marland Kitchen.. 1 once daily 8)  Clotrimazole-betamethasone 1-0.05 % Crea (Clotrimazole-betamethasone) .... Appply to rash prn 9)  Lipitor 20 Mg Tabs (Atorvastatin calcium) .... Take 1 tablet by mouth once a day 10)  Coumadin 2.5 Mg Tabs  (Warfarin sodium) .... Use as directed by anticoagulation clinic. 11)  Mobic 7.5 Mg Tabs (Meloxicam) .... Daily 12)  Cephalexin 500 Mg Tabs (Cephalexin) .... One po four times a day for 7 days  Patient Instructions: 1)  Lotrimen ultra cream apply to groin every night befor bed 2)  for the cyst warm compressed twice a day to site and take the antibiotic 3)  Take your antibiotic as prescribed until ALL of it is gone, but stop if you develop a rash or swelling and contact our office as soon as possible. Prescriptions: CEPHALEXIN 500 MG TABS (CEPHALEXIN) one po four times a day for 7 days  #28 x 0   Entered and Authorized by:   Stacie Glaze MD   Signed by:   Stacie Glaze MD on 09/05/2009   Method used:   Print then Give to Patient   RxID:   [id number] LIPITOR 20 MG TABS (ATORVASTATIN CALCIUM) Take 1 tablet by mouth once a day  #90 x 3   Entered by:   Willy Eddy, LPN   Authorized by:   Stacie Glaze MD   Signed by:   Willy Eddy, LPN on 30/86/5784   Method used:   Electronically to        [facility]* (mail-order)             ,          Ph: [phone]       Fax: [phone]   RxID:   [id number] CARDIZEM LA 240 MG XR24H-TAB (DILTIAZEM HCL COATED BEADS) 1 once daily  #90 x 3   Entered by:   Willy Eddy, LPN   Authorized by:   Stacie Glaze MD   Signed by:   Willy Eddy, LPN on 38/75/6433   Method used:   Electronically to        [facility]* (mail-order)             ,          Ph: [phone]       Fax: [phone]   RxID:   [id number]

## 2010-07-04 NOTE — Medication Information (Signed)
Summary: rov/ln  Anticoagulant Therapy  Managed by: Weston Brass, PharmD Referring MD: Lewayne Bunting PCP: Stacie Glaze MD Supervising MD: Shirlee Latch MD, [place] Indication 1: Atrial Fibrillation (ICD-427.31) Lab Used: [facility] Site: [facility] INR POC 2.8 INR RANGE 2.0-3.0  Dietary changes: no    Health status changes: no    Bleeding/hemorrhagic complications: no    Recent/future hospitalizations: no    Any changes in medication regimen? no    Recent/future dental: no  Any missed doses?: no       Is patient compliant with meds? yes       Allergies: No Known Drug Allergies  Anticoagulation Management History:      The patient is taking warfarin and comes in today for a routine follow up visit.  Positive risk factors for bleeding include an age of 27 years or older.  The bleeding index is 'intermediate risk'.  Positive CHADS2 values include History of HTN.  Negative CHADS2 values include Age > 61 years old.  The start date was 03/11/2007.  His last INR was 2.7.  Anticoagulation responsible provider: Shirlee Latch MD, [place].  INR POC: 2.8.  Cuvette Lot#: [id number].  Exp: 02/2011.    Anticoagulation Management Assessment/Plan:      The patient's current anticoagulation dose is Coumadin 2.5 mg tabs: Use as directed by anticoagulation clinic..  The target INR is 2.0-3.0.  The next INR is due 02/08/2010.  Anticoagulation instructions were given to patient.  Results were reviewed/authorized by Weston Brass, PharmD.  He was notified by Gweneth Fritter, PharmD [place].         Prior Anticoagulation Instructions: INR 2.6  Continue same regimen of 2 tabs on Monday, Wednesday, and Friday and 1 tab on Sunday, Tuesday, Thursday, and Saturday.  Re-check INR in 4 weeks.  Current Anticoagulation Instructions: INR 2.8  Continue taking 1 tablet (2.5mg ) every day except take 2 tablets (5mg ) on Monday, Wednesday, and Friday. Recheck in 4 weeks.

## 2010-07-06 NOTE — Assessment & Plan Note (Signed)
Summary: PER CHECK OUT/SF   Primary Provider:  Stacie Glaze MD   History of Present Illness: The patient presents today for routine pacemaker followup. He previously had a pacemaker implanted by Dr Antoine Poche for tachycardia- bradycardia syndrome.  He reports doing very well since last being seen in our clinic.  His exercise tolerance is preserved.  He plays golf and mowes his own lawn frequently.   The patient denies symptoms of palpitations, chest pain, shortness of breath, orthopnea, PND, lower extremity edema, dizziness, presyncope, syncope, or neurologic sequela. The patient is tolerating medications without difficulties and is otherwise without complaint today.   Current Medications (verified): 1)  Baby Aspirin 81 Mg  Chew (Aspirin) .... .qd 2)  Fluticasone Propionate 50 Mcg/act Susp (Fluticasone Propionate) .... Two Sprys in Each Nostril Daily 3)  Flomax 0.4 Mg  Cp24 (Tamsulosin Hcl) .... One Daily 4)  Metoprolol Succinate 50 Mg Xr24h-Tab (Metoprolol Succinate) .... Take 1 and 1/2 Tablet Daily 5)  Cardizem La 240 Mg Xr24h-Tab (Diltiazem Hcl Coated Beads) .Marland Kitchen.. 1 Once Daily 6)  Prilosec 20 Mg Cpdr (Omeprazole) .Marland Kitchen.. 1 Once Daily As Needed 7)  Multivitamins  Caps (Multiple Vitamin) .Marland Kitchen.. 1 Once Daily 8)  Clotrimazole-Betamethasone 1-0.05 % Crea (Clotrimazole-Betamethasone) .... Appply To Rash Prn 9)  Lipitor 20 Mg Tabs (Atorvastatin Calcium) .... Take 1 Tablet By Mouth Once A Day 10)  Coumadin 2.5 Mg Tabs (Warfarin Sodium) .... Use As Directed By Anticoagulation Clinic. 11)  Mobic 7.5 Mg Tabs (Meloxicam) .... Daily 12)  Nitrostat 0.4 Mg Subl (Nitroglycerin) .Marland Kitchen.. 1 Tablet Under Tongue At Onset of Chest Pain; You May Repeat Every 5 Minutes For Up To 3 Doses.  Allergies (verified): No Known Drug Allergies  Past History:  Past Medical History: Allergic rhinitis GERD Hypertension BPH Hyperlipidemia Permanent Atrial fibrillation Sick sinus syndrome with bradycardia S/P AV pacemaker  implant CAD (LAD 70% stenosis, AV circumflex free of     disease, right coronary artery had a previous stent, 30-40%     narrowing followed by subtotal occlusion, 95% mid vessel stenosis.     The vessel was stented with a non-drug-eluting stent by Dr.     Riley Kill).  Past Surgical History: Reviewed history from 02/23/2009 and no changes required. Pacemaker implant with lead revision and microperforation. Prior back surgery Cyst removed from R face  Vital Signs:  Patient profile:   76 year old male Height:      68 inches Weight:      206 pounds BMI:     31.44 Pulse rate:   64 / minute BP sitting:   120 / 70  (left arm)  Vitals Entered By: Laurance Flatten CMA (May 26, 2010 9:27 AM)  Physical Exam  General:  Well developed, well nourished, in no acute distress. Head:  normocephalic and atraumatic Eyes:  PERRLA/EOM intact; conjunctiva and lids normal. Mouth:  Teeth, gums and palate normal. Oral mucosa normal. Neck:  supple,  JVP 8cm Chest Wall:  pacemaker site is well healed Lungs:  Clear bilaterally to auscultation and percussion. Heart:  PMI non displaced.  Normal S1 and S2.  Reg rhythm (paced).  No rub. Abdomen:  Bowel sounds positive; abdomen soft and non-tender without masses, organomegaly, or hernias noted. No hepatosplenomegaly. Msk:  Back normal, normal gait. Muscle strength and tone normal. Extremities:  No clubbing or cyanosis. Neurologic:  Alert and oriented x 3.   PPM Specifications Following MD:  Rollene Rotunda, MD     PPM Vendor:  [employer]  PPM Model Number:  [id number]     PPM Serial Number:  [id number] PPM DOI:  03/11/2007     PPM Implanting MD:  Rollene Rotunda, MD  Lead 1    Location: [facility]     DOI: 03/11/2007     Model #: [id number]     Serial #: [id number]     Status: active Lead 2    Location: RV     DOI: 03/11/2007     Model #: [id number]     Serial #: [id number]     Status: active  Magnet Response Rate:  BOL 85 ERI 65  Indications:  TACHY BRADY SYNDROME   PPM  Follow Up Remote Check?  No Battery Voltage:  2.97 V     Pacer Dependent:  No       PPM Device Measurements Atrium  Amplitude: 2.0 mV, Impedance: 528 ohms,  Right Ventricle  Amplitude: 6.9 mV, Impedance: 440 ohms, Threshold: 0.5 V at 0.4 msec  Episodes Percent Mode Switch:  100%     Coumadin:  Yes Atrial Pacing:  1.9%     Ventricular Pacing:  41.1%  Parameters Mode:  DDIR     Lower Rate Limit:  60     Upper Rate Limit:  130 Paced AV Delay:  180     Sensed AV Delay:  150 Next Cardiology Appt Due:  11/03/2010 Tech Comments:  No parameter changes.  Device function normal.  A-fib 100%, + coumadin.  ROV 6 months clinic. Altha Harm, LPN  May 26, 2010 9:53 AM  MD Comments:  agree  Impression & Recommendations:  Problem # 1:  ATRIAL FIBRILLATION (ICD-427.31) stable and well rate controlled continue coumadin (goal INR 2-3)  Problem # 2:  PACEMAKER, PERMANENT (ICD-V45.01) bradycardia is controlled no changes today  Patient Instructions: 1)  Your physician wants you to follow-up in:  6 months with device clinic You will receive a reminder letter in the mail two months in advance. If you don't receive a letter, please call our office to schedule the follow-up appointment.

## 2010-07-06 NOTE — Medication Information (Signed)
Summary: rov/nb  Anticoagulant Therapy  Managed by: Weston Brass, PharmD Referring MD: Lewayne Bunting PCP: Stacie Glaze MD Supervising MD: Myrtis Ser MD, Tinnie Gens Indication 1: Atrial Fibrillation (ICD-427.31) Lab Used: [facility] Site: [facility] INR POC 1.9 INR RANGE 2.0-3.0  Dietary changes: no    Health status changes: no    Bleeding/hemorrhagic complications: no    Recent/future hospitalizations: no    Any changes in medication regimen? no    Recent/future dental: no  Any missed doses?: no       Is patient compliant with meds? yes       Allergies: No Known Drug Allergies  Anticoagulation Management History:      The patient is taking warfarin and comes in today for a routine follow up visit.  Positive risk factors for bleeding include an age of 74 years or older.  The bleeding index is 'intermediate risk'.  Positive CHADS2 values include History of HTN.  Negative CHADS2 values include Age > 74 years old.  The start date was 03/11/2007.  His last INR was 2.7.  Anticoagulation responsible provider: Myrtis Ser MD, Tinnie Gens.  INR POC: 1.9.  Cuvette Lot#: [id number].  Exp: 07/2011.    Anticoagulation Management Assessment/Plan:      The patient's current anticoagulation dose is Coumadin 2.5 mg tabs: Use as directed by anticoagulation clinic..  The target INR is 2.0-3.0.  The next INR is due 06/23/2010.  Anticoagulation instructions were given to patient.  Results were reviewed/authorized by Weston Brass, PharmD.  He was notified by Weston Brass PharmD.         Prior Anticoagulation Instructions: INR 2.6 Contine previous dose of 1 tablet everyday except 2 tablets on Monday, Wednesday, and Friday Recheck INR in 4 weeks  Current Anticoagulation Instructions: INR 1.9  Take 2 tablets today and tomorrow then resume same dose of 1 tablet every day except 2 tablets on Monday, Wednesday and Friday.  Recheck INR in 4 weeks.

## 2010-07-06 NOTE — Medication Information (Signed)
Summary: rov/sp  Anticoagulant Therapy  Managed by: Bethena Midget, RN, BSN Referring MD: Lewayne Bunting PCP: Stacie Glaze MD Supervising MD: Tenny Craw MD, Gunnar Fusi Indication 1: Atrial Fibrillation (ICD-427.31) Lab Used: [facility] Site: [facility] INR POC 2.5 INR RANGE 2.0-3.0  Dietary changes: no    Health status changes: no    Bleeding/hemorrhagic complications: no    Recent/future hospitalizations: no    Any changes in medication regimen? no    Recent/future dental: no  Any missed doses?: no       Is patient compliant with meds? yes       Allergies: No Known Drug Allergies  Anticoagulation Management History:      The patient is taking warfarin and comes in today for a routine follow up visit.  Positive risk factors for bleeding include an age of 74 years or older.  The bleeding index is 'intermediate risk'.  Positive CHADS2 values include History of HTN.  Negative CHADS2 values include Age > 74 years old.  The start date was 03/11/2007.  His last INR was 2.7.  Anticoagulation responsible provider: Tenny Craw MD, Gunnar Fusi.  INR POC: 2.5.  Cuvette Lot#: [id number].  Exp: 07/2011.    Anticoagulation Management Assessment/Plan:      The patient's current anticoagulation dose is Coumadin 2.5 mg tabs: Use as directed by anticoagulation clinic..  The target INR is 2.0-3.0.  The next INR is due 07/20/2010.  Anticoagulation instructions were given to patient.  Results were reviewed/authorized by Bethena Midget, RN, BSN.  He was notified by Bethena Midget, RN, BSN.         Prior Anticoagulation Instructions: INR 1.9  Take 2 tablets today and tomorrow then resume same dose of 1 tablet every day except 2 tablets on Monday, Wednesday and Friday.  Recheck INR in 4 weeks.   Current Anticoagulation Instructions: INR 2.5 Continue 2.5mg s everyday except 5mg s on Mondays, Wednesdays and Fridays. Recheck in 4 weeks.

## 2010-07-10 ENCOUNTER — Other Ambulatory Visit: Payer: Self-pay | Admitting: Internal Medicine

## 2010-07-10 ENCOUNTER — Telehealth: Payer: Self-pay | Admitting: Internal Medicine

## 2010-07-10 DIAGNOSIS — I4891 Unspecified atrial fibrillation: Secondary | ICD-10-CM

## 2010-07-10 MED ORDER — WARFARIN SODIUM 2.5 MG PO TABS: 2.5000 mg | ORAL_TABLET | Freq: Every day | ORAL | Status: DC

## 2010-07-10 NOTE — Telephone Encounter (Signed)
Pt called and said that his pharmacy sent a refill req over to Dr Lovell Sheehan this morning for pts coumedin. Pls call in to [facility] [phone].  Pt out of med.

## 2010-07-11 NOTE — Telephone Encounter (Signed)
meds called into pharmacy

## 2010-07-12 NOTE — Progress Notes (Signed)
Summary: Refill  Phone Note Refill Request Message from:  Patient  Refills Requested: Medication #1:  FLOMAX 0.4 MG  CP24 one daily Please send to [facility] [phone]  Initial call taken by: Georgian Co,  July 03, 2010 10:46 AM    Prescriptions: FLOMAX 0.4 MG  CP24 (TAMSULOSIN HCL) one daily  #90 x 3   Entered by:   Willy Eddy, LPN   Authorized by:   Stacie Glaze MD   Signed by:   Willy Eddy, LPN on 91/47/8295   Method used:   Telephoned to ...       [facility] (retail)       [address]       [place], [place]  [address]       Ph: [phone]       Fax: [phone]   RxID:   [id number]

## 2010-07-19 ENCOUNTER — Other Ambulatory Visit: Payer: Self-pay | Admitting: Internal Medicine

## 2010-07-20 ENCOUNTER — Encounter (INDEPENDENT_AMBULATORY_CARE_PROVIDER_SITE_OTHER): Payer: Medicare Other

## 2010-07-20 ENCOUNTER — Encounter: Payer: Self-pay | Admitting: Cardiology

## 2010-07-20 DIAGNOSIS — Z7901 Long term (current) use of anticoagulants: Secondary | ICD-10-CM

## 2010-07-20 DIAGNOSIS — I4891 Unspecified atrial fibrillation: Secondary | ICD-10-CM

## 2010-07-26 NOTE — Medication Information (Signed)
Summary: ccr/tmj  Anticoagulant Therapy  Managed by: Windell Hummingbird, RN Referring MD: Lewayne Bunting PCP: Stacie Glaze MD Supervising MD: Myrtis Ser MD, Tinnie Gens Indication 1: Atrial Fibrillation (ICD-427.31) Lab Used: [facility] Site: [facility] INR RANGE 2.0-3.0  Dietary changes: no    Health status changes: no    Bleeding/hemorrhagic complications: no    Recent/future hospitalizations: no    Any changes in medication regimen? no    Recent/future dental: no  Any missed doses?: no       Is patient compliant with meds? yes       Allergies: No Known Drug Allergies  Anticoagulation Management History:      The patient is taking warfarin and comes in today for a routine follow up visit.  Positive risk factors for bleeding include an age of 74 years or older.  The bleeding index is 'intermediate risk'.  Positive CHADS2 values include History of HTN.  Negative CHADS2 values include Age > 58 years old.  The start date was 03/11/2007.  His last INR was 2.7.  Anticoagulation responsible provider: Myrtis Ser MD, Tinnie Gens.  Cuvette Lot#: [id number].  Exp: 06/2011.    Anticoagulation Management Assessment/Plan:      The patient's current anticoagulation dose is Coumadin 2.5 mg tabs: Use as directed by anticoagulation clinic..  The target INR is 2.0-3.0.  The next INR is due 08/17/2010.  Anticoagulation instructions were given to patient.  Results were reviewed/authorized by Windell Hummingbird, RN.  He was notified by Windell Hummingbird, RN.         Prior Anticoagulation Instructions: INR 2.5 Continue 2.5mg s everyday except 5mg s on Mondays, Wednesdays and Fridays. Recheck in 4 weeks.   Current Anticoagulation Instructions: INR 3.1 Skip today's dose.  Then continue taking 1 tablet everyday, except take 2 tablets on Mondays, Wednesdays, and Fridays. Recheck in 4 weeks.

## 2010-08-15 ENCOUNTER — Encounter: Payer: Self-pay | Admitting: Cardiology

## 2010-08-15 DIAGNOSIS — I4891 Unspecified atrial fibrillation: Secondary | ICD-10-CM

## 2010-08-17 ENCOUNTER — Encounter: Payer: Self-pay | Admitting: Internal Medicine

## 2010-08-17 ENCOUNTER — Encounter (INDEPENDENT_AMBULATORY_CARE_PROVIDER_SITE_OTHER): Payer: Medicare Other

## 2010-08-17 DIAGNOSIS — I4891 Unspecified atrial fibrillation: Secondary | ICD-10-CM

## 2010-08-17 DIAGNOSIS — Z7901 Long term (current) use of anticoagulants: Secondary | ICD-10-CM

## 2010-08-17 LAB — CONVERTED CEMR LAB: POC INR: 2.8

## 2010-08-22 NOTE — Medication Information (Signed)
Summary: rov/pc  Anticoagulant Therapy  Managed by: Cloyde Reams, RN, BSN Referring MD: Lewayne Bunting PCP: Stacie Glaze MD Supervising MD: Tenny Craw MD, Gunnar Fusi Indication 1: Atrial Fibrillation (ICD-427.31) Lab Used: [facility] Site: [facility] INR POC 2.8 INR RANGE 2.0-3.0  Dietary changes: no    Health status changes: no    Bleeding/hemorrhagic complications: no    Recent/future hospitalizations: no    Any changes in medication regimen? no    Recent/future dental: no  Any missed doses?: no       Is patient compliant with meds? yes       Allergies: No Known Drug Allergies  Anticoagulation Management History:      The patient is taking warfarin and comes in today for a routine follow up visit.  Positive risk factors for bleeding include an age of 74 years or older.  The bleeding index is 'intermediate risk'.  Positive CHADS2 values include History of HTN.  Negative CHADS2 values include Age > 29 years old.  The start date was 03/11/2007.  His last INR was 2.7.  Anticoagulation responsible provider: Tenny Craw MD, Gunnar Fusi.  INR POC: 2.8.  Cuvette Lot#: [id number].  Exp: 08/2011.    Anticoagulation Management Assessment/Plan:      The patient's current anticoagulation dose is Coumadin 2.5 mg tabs: Use as directed by anticoagulation clinic..  The target INR is 2.0-3.0.  The next INR is due 09/14/2010.  Anticoagulation instructions were given to patient.  Results were reviewed/authorized by Cloyde Reams, RN, BSN.  He was notified by Cloyde Reams RN.         Prior Anticoagulation Instructions: INR 3.1 Skip today's dose.  Then continue taking 1 tablet everyday, except take 2 tablets on Mondays, Wednesdays, and Fridays. Recheck in 4 weeks.  Current Anticoagulation Instructions: INR 2.8  Continue on same dosage 1 tablet daily except 2 tablets on Mondays, Wednesdays, and Fridays.  Recheck in 4 weeks.

## 2010-09-14 ENCOUNTER — Ambulatory Visit (INDEPENDENT_AMBULATORY_CARE_PROVIDER_SITE_OTHER): Payer: Medicare Other | Admitting: *Deleted

## 2010-09-14 DIAGNOSIS — Z7901 Long term (current) use of anticoagulants: Secondary | ICD-10-CM

## 2010-09-14 DIAGNOSIS — I4891 Unspecified atrial fibrillation: Secondary | ICD-10-CM

## 2010-09-14 LAB — POCT INR: INR: 2.6

## 2010-10-08 ENCOUNTER — Encounter: Payer: Self-pay | Admitting: Cardiology

## 2010-10-10 ENCOUNTER — Ambulatory Visit (INDEPENDENT_AMBULATORY_CARE_PROVIDER_SITE_OTHER): Payer: Medicare Other | Admitting: *Deleted

## 2010-10-10 ENCOUNTER — Encounter: Payer: Self-pay | Admitting: Cardiology

## 2010-10-10 ENCOUNTER — Ambulatory Visit (INDEPENDENT_AMBULATORY_CARE_PROVIDER_SITE_OTHER): Payer: Medicare Other | Admitting: Cardiology

## 2010-10-10 ENCOUNTER — Other Ambulatory Visit: Payer: Self-pay | Admitting: Cardiology

## 2010-10-10 DIAGNOSIS — I4891 Unspecified atrial fibrillation: Secondary | ICD-10-CM

## 2010-10-10 DIAGNOSIS — E7800 Pure hypercholesterolemia, unspecified: Secondary | ICD-10-CM

## 2010-10-10 DIAGNOSIS — I2510 Atherosclerotic heart disease of native coronary artery without angina pectoris: Secondary | ICD-10-CM

## 2010-10-10 DIAGNOSIS — Z950 Presence of cardiac pacemaker: Secondary | ICD-10-CM

## 2010-10-10 DIAGNOSIS — E785 Hyperlipidemia, unspecified: Secondary | ICD-10-CM

## 2010-10-10 LAB — POCT INR: INR: 2.6

## 2010-10-10 NOTE — Progress Notes (Signed)
Pacer check in clinic  

## 2010-10-10 NOTE — Patient Instructions (Signed)
Your physician wants you to follow-up in: 1 YEAR.  You will receive a reminder letter in the mail two months in advance. If you don't receive a letter, please call our office to schedule the follow-up appointment.  Your physician recommends that you continue on your current medications as directed. Please refer to the Current Medication list given to you today.  Your physician recommends that you return for a FASTING lipid profile and liver profile in 1 MONTH (427.31, 272.0, 414.01)--nothing to eat or drink after midnight, lab opens at 8:30

## 2010-10-11 NOTE — Assessment & Plan Note (Signed)
Remains on warfarin without difficulty.

## 2010-10-11 NOTE — Assessment & Plan Note (Signed)
Under the care of Dr Antoine Poche who placed.

## 2010-10-11 NOTE — Assessment & Plan Note (Signed)
Currently on lipitor followed by  Dr. Lovell Sheehan.

## 2010-10-11 NOTE — Progress Notes (Signed)
HPI:  He is doing great.  No major complaints at present.  He feels good and denies any chest pain at the present time.  Not short of breath.  No bleeding issues.   Current Outpatient Prescriptions  Medication Sig Dispense Refill  . aspirin 81 MG tablet Take 81 mg by mouth daily.        Marland Kitchen atorvastatin (LIPITOR) 20 MG tablet TAKE 1 TABLET ONCE DAILY  90 tablet  2  . fluticasone (FLONASE) 50 MCG/ACT nasal spray 2 sprays by Nasal route as directed.        Marland Kitchen MATZIM LA 240 MG 24 hr tablet TAKE 1 TABLET DAILY  90 tablet  3  . meloxicam (MOBIC) 7.5 MG tablet Take 7.5 mg by mouth daily.        . metoprolol (TOPROL-XL) 50 MG 24 hr tablet Take 75 mg by mouth daily.        . nitroGLYCERIN (NITROSTAT) 0.4 MG SL tablet Place 0.4 mg under the tongue every 5 (five) minutes as needed.        Marland Kitchen omeprazole (PRILOSEC) 20 MG capsule Take 20 mg by mouth daily.        . Tamsulosin HCl (FLOMAX) 0.4 MG CAPS Take 0.4 mg by mouth daily.        Marland Kitchen warfarin (COUMADIN) 2.5 MG tablet Take by mouth as directed.          No Known Allergies  Past Medical History  Diagnosis Date  . Actinic keratosis   . ALLERGIC RHINITIS   . Atrial fibrillation   . CAD, NATIVE VESSEL   . Cellulitis and abscess of trunk   . CHEST PAIN, ACUTE   . HYPERLIPIDEMIA   . HYPERTENSION   . OTH MALIG NEOPLASM SKIN OTH&UNSPEC PARTS FACE   . OTH MALIG NEOPLASM SKIN OTH\T\UNSPEC PARTS FACE   . PACEMAKER, PERMANENT     Past Surgical History  Procedure Date  . Insert / replace / remove pacemaker     with lead rvision and microperforation  . Back surgery   . Cystectomy     from right side of face.    Family History  Problem Relation Age of Onset  . Colon cancer Other     History   Social History  . Marital Status: Married    Spouse Name: N/A    Number of Children: N/A  . Years of Education: N/A   Occupational History  . Not on file.   Social History Main Topics  . Smoking status: Never Smoker   . Smokeless tobacco: Not on  file  . Alcohol Use: No  . Drug Use: No  . Sexually Active: Not on file   Other Topics Concern  . Not on file   Social History Narrative  . No narrative on file    ROS: Please see the HPI.  All other systems reviewed and negative.  PHYSICAL EXAM:  BP 124/68  Pulse 72  Resp 18  Ht 5\' 6"  (1.676 m)  Wt 203 lb 6.4 oz (92.262 kg)  BMI 32.83 kg/m2  General: Well developed, well nourished, in no acute distress. Head:  Normocephalic and atraumatic. Neck: no JVD.  Pacer site looks good. Lungs: Clear to auscultation and percussion. Heart: Normal S1 and S2.  No murmur, rubs or gallops.  Abdomen:  Normal bowel sounds; soft; non tender; no organomegaly Pulses: Pulses normal in all 4 extremities. Extremities: No clubbing or cyanosis. No edema. Neurologic: Alert and oriented x 3.SUMMARY   -  Overall left ventricular systolic function was normal. Left         ventricular ejection fraction was estimated to be 60 %. There         was no diagnostic evidence of left ventricular regional wall         motion abnormalities.   -  The pulmonary veins were grossly normal.   -  The right ventricle was mildly dilated. Right ventricular         systolic function was mildly reduced. There was the         appearance of a catheter or pacing wire in the right         ventricle.   -  There was a trivial pericardial effusion posterior to the heart.   EKG:  atiral fib and v pacing.  ECHO:  2009   ASSESSMENT AND PLAN:

## 2010-10-11 NOTE — Assessment & Plan Note (Signed)
No recurrent angina 

## 2010-10-17 NOTE — Cardiovascular Report (Signed)
NAME:  Shawn [name], Shawn [name] NO.:  [id number]   MEDICAL RECORD NO.:  [id number]          PATIENT TYPE:  INP   LOCATION:  3708                         FACILITY:  [facility]   PHYSICIAN:  Arturo Morton. Riley Kill, MD, FACCDATE OF BIRTH:  11-25-1934   DATE OF PROCEDURE:  03/07/2007  DATE OF DISCHARGE:                            CARDIAC CATHETERIZATION   INDICATIONS:  Mr. Balingit is a 76 year old gentleman who presents with  an episode of chest discomfort about 2 weeks ago.  Radionuclide imaging  study demonstrated inferior ischemia.  He has had two prior stents  placed in the right coronary artery.  He also had an EKG that suggested  an out-of-hospital MI.  Dr. Daleen Squibb admitted him and set him up for cardiac  catheterization.  Shortly after hospital admission, he was noted to be  bradycardic on his combination of Toprol and diltiazem.  The diltiazem  dose was reduced.  He developed recurrent atrial fibrillation with a  modestly fast ventricular response.  His Coumadin was withheld.  He was  started on a heparin drip.  He was brought to the laboratory today for  further evaluation.   DESCRIPTION OF PROCEDURE:  1. Left heart catheterization.  2. Selective coronary arteriography.  3. Selective left ventriculography.   DESCRIPTION OF PROCEDURE:  The patient was brought to the cath lab and  prepped and draped in usual fashion.  Shortly after going on the table,  the patient did have some left upper quadrant discomfort.  The plan was  to proceed with the procedure, and actually catheterization was  performed without complication.  The discomfort in the left upper  quadrant persisted.  The patient had been coming off of his  anticoagulation for atrial fib.  Whether this represented some sort of  embolic event was unclear, such as a bowel and/or spleen.  As a result,  any percutaneous intervention was deferred.  The patient was taken off  the table and into the holding area.  Sheath was  removed.  The upper  quadrant discomfort completely resolved.  The patient was stable without  recurrent symptoms.  The case was then reviewed with Dr. Graciela Husbands in  detail.  The patient has tachy-brady syndrome with a bradycardia, on  fairly low-dose medication, and rapid ventricular response, with slight  reduction in the dose.  The patient also has two drug-eluting stents in  the right coronary artery but has a new lesion in the mid-right  coronary.  He and I discussed the case.  Tentatively, the plan would be  for a non drug-eluting stent in the mid-right coronary artery, followed  by implantation of permanent pacemaker the following day.  After this,  the patient to be restarted on warfarin anticoagulation, with good rate  control medications.  He then can be treated with aspirin and Plavix for  4 weeks.  This will be the potential plan.   HEMODYNAMIC DATA:  1. Central aortic pressure 150/84, mean 114.  2. Left ventricular pressure 154/15.  3. No gradient pullback across the aortic valve.   ANGIOGRAPHIC DATA:  1. The left main was free of critical disease.  2. The LAD has about a 60% to 70% area of focal stenosis, after the      takeoff of the intermediate vessel.  Importantly, this area appears      to be hypodense in the LAO view, suggesting that the lesion may be      a pancake lesion and tighter than appears in all other views.  It      slightly progressed from the previous study.  The mid and distal      LAD are without critical disease.  The large first intermediate or      diagonal branch is also without critical narrowing.  3. The AV circumflex provides basically one marginal branch and is      free of critical disease.  4. The right coronary artery is stented at the ostium.  There is no      significant residual stenosis.  The vessel opens up, and there is      30% to 40% narrowing and a sub-total occlusion of 95% the mid-      vessel.  The distal vessel was stented as  well.  There is antegrade      flow into the posterior descending and posterolateral branch.  On      left injections, there is some faint collateralization of the      distal right coronary.  5. Ventriculography in the RAO projection reveals preserved global      systolic function with only hypokinesis of the inferior segment.   CONCLUSIONS:  1. Continued patency of the RCA stents with a new high-grade lesion in      the mid-right coronary and question recent hospital MI.  2. Recurrent atrial fibrillation with relatively rapid ventricular      response at the present time, with heart rates just over 100.  3. Left upper quadrant pain during the procedure, subsequently      resolved after taking him off the table.  4. Bradycardia, on combination Toprol, diltiazem.   PLAN:  1. We will start aspirin and Plavix.  2. Within approximately 12 hours, we will restart his heparin      anticoagulation over the weekend.  3. Percutaneous stenting of the right coronary artery on Monday,      accompanied by intravascular ultrasound of the LAD and possible      stenting of the LAD, as well.  4. Permanent pacemaker will be planned for Tuesday, followed by      resumption of Coumadin anticoagulation, shortly thereafter.   This has been explained to the patient and family.  They are agreeable  to proceed.      Arturo Morton. Riley Kill, MD, [facility]  Electronically Signed     TDS/MEDQ  D:  03/07/2007  T:  03/09/2007  Job:  [id number]   cc:   Arturo Morton. Riley Kill, MD, [facility]  Duke Salvia, MD, [facility]  CV Laboratory  Patient Medical Record

## 2010-10-17 NOTE — H&P (Signed)
NAME:  Shawn [name], Shawn [name] NO.:  [id number]   MEDICAL RECORD NO.:  [id number]          PATIENT TYPE:  INP   LOCATION:  3737                         FACILITY:  [facility]   PHYSICIAN:  Jesse Sans. Wall, MD, FACCDATE OF BIRTH:  09/09/1934   DATE OF ADMISSION:  03/04/2007  DATE OF DISCHARGE:                              HISTORY & PHYSICAL   CHIEF COMPLAINT:  Chest discomfort and breaking out in a sweat.   HISTORY OF PRESENT ILLNESS:  Shawn [name] is a 76 year old,  married, white male who came to the office today for a stress Myoview.  His EKG showed some new changes in the inferior leads, with T-wave  inversion in leads III and AVF since our last visit with him in 2004.  He did not report any active chest discomfort.   With the adenosine, he had no symptoms other than feeling tired.  His EF  was 63%, and he has inferior lateral ischemia on his preliminary scan.  I was asked to see him by the nuclear staff.   Talking to his wife and him today, this event occurred a week ago  Monday.  He had aching in his epigastric area with nausea and breaking  out in a sweat.  It lasted for several hours.  He did not seek medical  attention, and his wife said it scared her to death.   Since then, he has had a couple of episodes of epigastric discomfort.   PAST MEDICAL HISTORY:  Significant for coronary disease.  He has had two  stents placed in the right coronary artery in 2004.  These were drug-  eluting Taxus stents.  At that time, he had nonobstructive disease in  the LAD and the ramus intermedius.  His EF was 65%.  He has not been  seen since then.   He also has a history of hypertension, obesity, and paroxysmal atrial  flutter.  Of note, he is on Coumadin.   ALLERGIES:  His past medical history is significant also for no known  drug allergies.   CURRENT MEDICATIONS:  1. Mobic 7.5 mg a day.  2. Diltiazem 240 mg a day.  3. Coumadin as directed.  4. Toprol-XL 50 mg a  day.  5. Lipitor 80 mg a day.  6. Flomax 0.4 mg a day.  7. Multivitamin.  8. Prilosec 20 mg over the counter daily.  9. Aspirin 81 mg a day.   SOCIAL HISTORY:  His past medical history is also significant for no  tobacco use, no alcohol use.  He enjoys playing golf, mowing and working  in his garden.  He is retired.   PAST SURGICAL HISTORY:  He has had previous back surgery in 2000.  Removal of a cyst on the right side of his face in 1985.   FAMILY HISTORY:  Negative for premature coronary disease.   SOCIAL HISTORY:  Retired.  He is married.  He has 2 children.   REVIEW OF SYSTEMS:  Negative other than the HPI.   PHYSICAL EXAMINATION:  GENERAL:  A very pleasant gentleman, no acute  distress.  VITAL SIGNS:  His blood pressure is 136/78, his pulse is 56 and regular.  Weight 198.  HEENT:  Normocephalic, atraumatic.  PERRLA.  Extraocular movements are  intact.  Sclerae clear.  Facial symmetry is normal.  His teeth are in  good shape.  NECK:  Carotid upstrokes are equal bilaterally without bruits.  No JVD.  Thyroid is not enlarged.  Trachea is midline.  LUNGS:  Clear.  HEART:  Reveals a soft S1, S2, without murmurs, rubs or gallops.  ABDOMEN:  Protuberant, good bowel sounds.  EXTREMITIES:  With no clubbing, cyanosis or edema.  Pulses are intact.  NEURO:  Intact.  SKIN:  Unremarkable.   ASSESSMENT:  1. Recurrent epigastric discomfort with nausea and diaphoresis, now      with new EKG changes inferiorly and a positive stress Myoview      showing inferior lateral wall ischemia.  Rule out obstructive      coronary disease.  2. Known coronary disease status post Taxus stents x2 in the right      coronary artery in 2004.  3. Other risk factors as mentioned above.   PLAN:  He is on Coumadin.  We will check an INR here in the clinic.  I  am going to admit him anyway with the EKG changes and the rest of his  symptoms.  He may have to wait several days until his Coumadin clears to   have a cath.  He understands the indication, risks, potential benefits  of pursuing admission and intervention.  He and his wife agree to  proceed.      Thomas C. Daleen Squibb, MD, [facility]  Electronically Signed     TCW/MEDQ  D:  03/04/2007  T:  03/04/2007  Job:  [id number]   cc:   Stacie Glaze, MD

## 2010-10-17 NOTE — Op Note (Signed)
NAME:  Shawn [name], Shawn [name] NO.:  [id number]   MEDICAL RECORD NO.:  [id number]          PATIENT TYPE:  OBV   LOCATION:  4731                         FACILITY:  [facility]   PHYSICIAN:  Doylene Canning. Ladona Ridgel, MD    DATE OF BIRTH:  1935-01-08   DATE OF PROCEDURE:  03/21/2007  DATE OF DISCHARGE:  03/22/2007                               OPERATIVE REPORT   PROCEDURE PERFORMED:  Pacemaker atrial and ventricular lead revision.   INDICATIONS:  Status post pacemaker implantation approximately 11 days  ago complicated by microperforation (lead unclear) now referred for  pacemaker lead revision as the patient has has enlarging pericardial  effusion.   INTRODUCTION:  The patient is a 76 year old male with coronary artery  disease status post recent percutaneous intervention placed on Plavix  who had symptomatic tachy-brady syndrome and underwent permanent  pacemaker insertion approximately two weeks ago.  The patient was stable  but was noted initially after his procedure to have pleuritic chest pain  and subsequently has developed a very gradually enlarging pericardial  effusion.  His pleuritic pain has resolved, but because of the need for  Plavix for the next month and because of a concern about worsening  pericardial effusion, he is now referred for pacemaker lead revision.   PROCEDURE:  After informed consent was obtained, the patient was taken  to the diagnostic EP lab in a fasting state.  After the usual  preparation and draping, intravenous fentanyl and midazolam was given  for sedation.  30 mL lidocaine was infiltrated in the old pacemaker  insertion site and a 6 cm incision was carried out over this region.  Electrocautery was utilized to dissect down to the fascial plane.  With  entering the pocket, there was a serous fluid that drained out,  approximately 15-20 mL.  This was suctioned out.  The generator and  leads were removed from their silk suture.  A stylet was advanced  in  both into the atrial and ventricular leads and the helix of each lead  was retracted into the body of the lead.  The leads were both placed  back in the right atrium.  A curved stylet was then advanced into the  ventricular lead which, on advancement of the lead across the tricuspid  valve, placed the lead on the RV septum.  At this location, the R-waves  measured between 5 and 7 mV and there was a nice injury current. The  impedance was 680 ohms and threshold 0.4 volts of 0.5 milliseconds.  10  volts pacing not stimulate diaphragm.  With the ventricular lead in  satisfactory position, attention was then turned to placement of the  atrial lead which was placed in the anterolateral portion of the right  atrial appendage where P-waves measured 5 mV and the pacing threshold  was 1.5 volt at 0.5 milliseconds.  There was a very large injury current  here. The impedance was 600 ohms.  10 volts pacing volts in the atrium  and the ventricle did not stimulate the diaphragm.  With both the atrial  and ventricular leads in satisfactory position, they  were secured to the  subpectoralis fascia with a silk suture.  The sewing sleeve was secured  with a silk suture.  Electrocautery was utilized to enlarge the  subcutaneous pocket.  Kanamycin irrigation was utilized to irrigate the  pocket and electrocautery utilized to assure hemostasis.  The previously  detached Medtronic dual chamber pacemaker was then connected to the  atrial and ventricular leads (this was an Enrhythm device) and placed  back in the subcutaneous pocket.  Additional kanamycin was then utilized  to irrigate the pocket.  The incision was closed with a layer of 2-0  Vicryl followed by a layer of 3-0 Vicryl.  Benzoin was painted on the  skin, Steri-Strips were applied, a pressure dressing was placed.  The  patient was returned to his room in satisfactory condition.   COMPLICATIONS:  There were no immediate procedure  complications.   RESULTS:  This demonstrates successful removal and insertion of a  Medtronic dual chamber pacemaker with both atrial and ventricular lead  revision secondary to microperforation (question which lead).      Doylene Canning. Ladona Ridgel, MD  Electronically Signed     GWT/MEDQ  D:  03/21/2007  T:  03/22/2007  Job:  [id number]   cc:   Rollene Rotunda, MD, [facility]  Stacie Glaze, MD

## 2010-10-17 NOTE — Discharge Summary (Signed)
NAME:  Shawn [name], PAYEUR NO.:  [id number]   MEDICAL RECORD NO.:  [id number]          PATIENT TYPE:  OBV   LOCATION:  4731                         FACILITY:  [facility]   PHYSICIAN:  Doylene Canning. Ladona Ridgel, MD    DATE OF BIRTH:  05/18/1935   DATE OF ADMISSION:  03/21/2007  DATE OF DISCHARGE:  03/22/2007                               DISCHARGE SUMMARY   FINAL DIAGNOSES:  1. Discharging day 1, status post revision of right atrial and right      ventricular pacemaker leads.  2. Day #11, status post implant of a Medtronic EnRhythm dual-chamber      pacemaker.  Postprocedure enlarging pericardial effusion secondary      to a suspected microperforation.   SECONDARY DIAGNOSES:  1. Abnormal Myoview study March 04, 2007.  Ejection fraction of      63% with positive inferolateral ischemia.  2. Left heart catheterization, March 07, 2007.      a.     The left anterior descending 60% to 70% after the ramus       intermediate.  Medical therapy going forward.      b.     Right coronary artery, the ostial stent is patent.  The       distal stent is patent, but there is a mid 0.95% stenosis.      c.     Percutaneous coronary intervention (bare-metal stent) to the       mid right coronary artery, March 11, 2007.  3. Recurrent atrial fibrillation.  Tachybrady syndrome with the      patient bradycardic on low dose of AV node modulators  4. The left anterior descending is not stented, March 11, 2007,      secondary to a negative Myoview in the left anterior descending      distribution.  5. Hypertension  6. Obesity.  7. Echocardiogram, March 12, 2007, ejection fraction 55% to 60%.      Small pericardial effusion.  8. Echocardiogram, March 13, 2007, ejection fraction 55% to 60%.      Moderate pericardial effusion.  No right ventricular compromise.  9. Echocardiogram, March 20, 2007, large pericardial effusion.  Mild      right atrial chamber collapse, mild right ventricular  chamber      collapse.   PROCEDURE:  March 21, 2007, pacemaker lead revision.  Both right  atrial and right ventricular leads were removed and repositioned in a  pacemaker with microperforation and pericardial effusion; Dr. Lewayne Bunting.   BRIEF HISTORY:  [name] is a 76 year old male.  He visited the  office at [facility] on October 16th to follow up on a  pericardial effusion with an echocardiogram.  He had a pacemaker placed  on October 7th without apparent complications.  However, the day  following, he had slight her pleuritic pain.  An echocardiogram did show  a small pericardial effusion.  An echocardiogram on the next day,  October 8th, showed that it was perhaps slightly larger or stable.  The  patient had normal pacing thresholds and was having no symptoms.  He had  no shortness of breath and no particular chest discomfort.  He had no  hypertension and no pulsus paradoxus.  He was discharge from the  hospital.   During that hospitalization, he had a bare-metal stent placed and was on  Plavix.  He also had a pacemaker implanted for tachybrady syndrome.  The  patient had recurrent atrial fibrillation at the time of the  hospitalization for an abnormal Myoview followed by left heart  catheterization.  The patient was discharged from that hospitalization  without Coumadin being restarted.   The patient presents for echocardiogram, October 16th, and followup, and  clearly has a larger pericardial effusion.  It is bordering on large.  It is circumflex.  The patient has no clear respiratory variation across  the mitral valve.  There is some mild right ventricular diastolic  collapse.  The patient is not having any symptoms whatsoever.  He denies  shortness of breath.  He does not have PND or orthopnea.  He has not  been having chest discomfort.  Pacer thresholds were once again re-  interrogated and found to be stable.   The patient has an enlarging pericardial  effusion.  This is presumed to  be secondary to microperforation which has not sealed itself and was  likely exacerbated by Plavix therapy.  The patient will have his leads  repositioned.  Dr. Antoine Poche has discuss this with Dr. Ladona Ridgel.  This will  be done on the morning of October 17th in the electrophysiology lab.  The risks and benefits have been described to the patient.  He wishes to  proceed.   HOSPITAL COURSE:  The patient presented October 17th after an office  visit which showed, by echocardiogram, an enlarging pericardial  effusion.  Under Dr. Lubertha Basque care, both the right atrial and right  ventricular leads were removed and repositioned with successful capture.  The patient then had resuturing of his pacemaker incision.  He has not  noticed, by the way, any pleuritic type pain in the chest over on his  right side as he did after the original pacemaker implantation.  The  patient will have his Plavix held for 48 hours and resumed it on Monday,  October 20th.  He will have Coumadin held for 5 days, and resumed it on  Thursday, October 23rd.  At the time of discharge, the incision is  without hematoma and healing nicely.  The patient has mild discomfort at  the incision site which response to oral analgesics.  He has been  advised to keep his incision dry for the next 7 days, to sponge-bathe  until Friday, October 24th.  He will go home with a prescription for  Keflex 1 tablet 1/2 hour before meals and at bedtime for the next 5  days.  He will also have a prescription for Darvocet-N 100 one to two  tabs every 4-6 hours as needed for pain.  His other medications are as  follows:  1. Plavix 75 mg daily starting Monday, October 20th, but not before.      He will be finished with Plavix on November 7th.  2. Enteric-coated aspirin 325 mg daily.  3. Coumadin to start 5 mg alternating with 2.5 mg every other day      starting Thursday, October 23rd, but not before.  4. Cardizem 240 mg  daily.  5. Toprol XL 75 mg daily, which is one and one-half 50-mg tablets.  6. Lipitor 80 mg daily at bedtime.  7.  Flomax 0.4 mg daily.  8. Prilosec OTC 20 mg daily.  9. Multivitamin daily.  10.Nitroglycerin tabs 0.4 mg 1 tablet under the tongue every 5 minutes      x3 doses as needed for chest pain.   FOLLOWUP:  [facility], [address].  Our office  will call to have him come for an echocardiogram either Monday afternoon  or Tuesday sometime after 9:30 the morning.  The pacer clinic will see  him Monday, October 27th, at 9:20.  He will see Dr. Riley Kill Monday,  October 27th, at 10:00, and he will have a Coumadin Clinic visit Monday,  October 27th, after he sees Dr. Riley Kill.  He will also keep his  appointment with Dr. Antoine Poche ,June 12, 2007, at 11:30.   Laboratory studies this admission include complete blood count which is  hemoglobin 12, hematocrit 34.9, white cells 10.1, platelets at 161.  His  pro time on October 13th was 13.3, INR 1.      Maple Mirza, PA      Doylene Canning. Ladona Ridgel, MD  Electronically Signed    GM/MEDQ  D:  03/21/2007  T:  03/24/2007  Job:  [id number]   cc:   Stacie Glaze, MD  Arturo Morton. Riley Kill, MD, [facility]  Rollene Rotunda, MD, [facility]  Lewayne Bunting, MD

## 2010-10-17 NOTE — Assessment & Plan Note (Signed)
[facility]                            CARDIOLOGY OFFICE NOTE   Shawn [name], Shawn [name]                    MRN:          [id number]  DATE:03/15/2008                            DOB:          [birth year]    PRIMARY CARE PHYSICIAN:  Stacie Glaze, MD   REASON FOR THE PRESENTATION:  Evaluate the patient with pacemaker.   HISTORY OF PRESENT ILLNESS:  The patient is a pleasant 76 year old  gentleman with pacemaker placement with tachybrady syndrome.  This was  complicated by microperforation with pericardial effusion.  He had to  have the pacemaker repositioned.  He did well subsequently.  His last  echo was in January 2009, and there was no significant effusions.  He  has done well from a cardiovascular standpoint.  He denies any  palpitation, presyncope, or syncope.  He is active in his yard, though  he is not exercising as much as I would like.  He does do some walking.  He has not had any chest discomfort, neck or arm discomfort.  Denies any  shortness of breath.   PAST MEDICAL HISTORY:  1. Tachybrady syndrome.  2. Status post pacemaker placement (lead revision, atrial lead for      probable microperforation by Dr. Ladona Ridgel).  3. Paroxysmal atrial fibrillation.  4. Mild anemia.  5. Hypertension.  6. Coronary artery disease (LAD 70% stenosis, AV circumflex free of      disease, right coronary artery had a previous stent, 30-40%      narrowing followed by subtotal occlusion, 95% mid vessel stenosis.      The vessel was stented with a non-drug-eluting stent by Dr.      Riley Kill).  7. Back surgery.  8. Cyst removed from the right side of his face.   ALLERGIES:  None.   MEDICATIONS:  1. Diltiazem 240 mg daily.  2. Coumadin.  3. Lipitor 40 mg daily.  4. Flomax 0.4 mg daily.  5. Multivitamin.  6. Prilosec.  7. Toprol 75 mg daily.  8. Aspirin.  9. Lipitor 40 mg daily.   REVIEW OF SYSTEMS:  As stated in the HPI and otherwise negative for  other  systems.   PHYSICAL EXAMINATION:  GENERAL:  The patient is in no distress.  VITAL SIGNS:  Blood pressure 142/75, heart rate 60 and regular, weight  102 pounds, body mass index 30.  HEENT:  Eyes unremarkable, pupils equal, round, and reactive to light,  fundi not visualized, oral mucosa unremarkable.  NECK:  No jugular venous distention at 45 degrees, carotid upstroke  brisk and symmetrical.  No bruits, no thyromegaly.  LYMPHATICS:  No cervical, axillary, inguinal adenopathy.  LUNGS:  Clear to auscultation bilaterally.  CHEST:  Well-healed left upper ICD pocket.  HEART:  PMI not displaced or sustained, S1 and S2 within normal limits.  No S3, no S4, no clicks, no rubs, no murmurs.  ABDOMEN:  Flat, obese, positive bowel sounds normal in frequency and  pitch, no bruits, no rebound, no guarding, no midline pulsatile mass, no  organomegaly.  SKIN:  No rashes, no nodules.  EXTREMITIES:  Pulses 2+,  no edema.   EKG sinus rhythm, rate 76, occasional atrial pacing.   PACEMAKER INTERROGATION:  The atrial lead amplitude was 4.7, impedance  512, threshold 1 V at 0.4 msec.  The right ventricular amplitude was 5.2  mV, the impedance 464 ohms, threshold 1 V at 0.4 msec, battery voltage  2.97 V.  He had mode switching about 30% of the time.  He was A pacing  46% of the time, V pacing 10% of the time.  There were no changes made.   ASSESSMENT AND PLAN:  1. Tachybrady syndrome.  The patient's pacemaker seems to be      functioning well.  We made no changes.  He will be seen in the      Pacemaker Clinic in 6 months and set up for home monitoring.  I      will see him again in 1 year for his office visit.  2. Atrial fibrillation.  He remains on his Coumadin.  He seems have      reasonable rate control.  Further management per Dr. Riley Kill.  3. Dyslipidemia.  I took this opportunity to review his recent labs      with him.  His LDL is excellent.  His HDL remains low, and we      discussed exercise to  bring that up.  4. Hypertension.  Blood pressure is slightly elevated.  Again, we      discussed need for weight loss and exercise which should bring him      to target.  5. Coronary disease per Dr. Riley Kill.  6. Followup.  We will see him back in 1 year or sooner if needed.     Rollene Rotunda, MD, [facility]  Electronically Signed    JH/MedQ  DD: 03/15/2008  DT: 03/16/2008  Job #: [id number]   cc:   Stacie Glaze, MD

## 2010-10-17 NOTE — Assessment & Plan Note (Signed)
[facility]                         ELECTROPHYSIOLOGY OFFICE NOTE   Shawn, [name]                    MRN:          [id number]  DATE:03/20/2007                            DOB:          1934/09/25    Shawn [name] was seen today as a nonscheduled postoperative visit for  followup of his Medtronic Adapta pacemaker.  His device was implanted by  Dr. Antoine Poche on 03/11/2007 for tachy-brady syndrome.  Interrogation of  his device demonstrates P-waves of 1.4 millivolts  with an atrial  impedance  of 464 ohms and a threshold of 0.5 volts at 0.4 milliseconds.  His R-waves measured 6.2 millivolts  with RV impedance  of 512 ohms and  a threshold of 0.5 volts at 0.4 milliseconds.  His battery voltage was  3.07 volts.  He was in normal sinus rhythm today.  He had had one mode  switch episode which he has a history of.  He was on chronic Coumadin  therapy until after his implant and this is on hold currently secondary  to a pericardial effusion.  He is presently MVP with a lower rate of 60,  upper rate at 130 with a paced AV of 180 msec and a sensed AV of 150  msec.  He will return to clinic in 3 months time with Dr. Antoine Poche. His  steri-strips were removed today.  His wound was without redness or  swelling.      Gypsy Balsam, RN,BSN  Electronically Signed      Rollene Rotunda, MD, [facility]  Electronically Signed   AS/MedQ  DD: 03/20/2007  DT: 03/21/2007  Job #: [id number]

## 2010-10-17 NOTE — Cardiovascular Report (Signed)
NAME:  OLUWADARASIMI, REDMON NO.:  [id number]   MEDICAL RECORD NO.:  [id number]          PATIENT TYPE:  INP   LOCATION:  6522                         FACILITY:  [facility]   PHYSICIAN:  Arturo Morton. Riley Kill, MD, FACCDATE OF BIRTH:  1935/02/25   DATE OF PROCEDURE:  03/11/2007  DATE OF DISCHARGE:                            CARDIAC CATHETERIZATION   INDICATIONS:  Mr. Valenza is a very delightful 76 year old who presents  with marked inferior ischemia. After being hospitalized with an abnormal  myocardial perfusion study, the patient developed paroxysmal atrial  fibrillation.  He is on Coumadin anticoagulation, and this has been  withheld.  Importantly, the patient has had a tachybrady form, with  substantial bradycardia on combination diltiazem and metoprolol.  In  addition, when the diltiazem was reduced in dose, the patient developed  atrial fibrillation that was not well controlled.  Dr. Graciela Husbands and I  discussed his situation in some detail.  He has a critical stenosis in  the right coronary artery and a moderate stenosis in the LAD.  I have  carefully reviewed the films in the LAD, and we will plan to relook at  this with a guiding catheter at the time of percutaneous intervention.   PROCEDURE:  1. Percutaneous stenting of the right coronary artery.  2. Left coronary angiography.   DESCRIPTION OF PROCEDURE:  The patient was brought to the  catheterization lab and prepped and draped in usual fashion after  informed consent.  He was in sinus rhythm at this time. He had been on  Plavix and aspirin.  Through an anterior puncture, the left femoral  artery was entered, and a 6-French sheath was placed.  Following this, a  JR-4 guiding catheter with side holes was utilized to intubate the right  coronary. Scout views were obtained. Bivalirudin was given according to  protocol, and ACT checked and found to be appropriate for PCI.  Following this, a wire was placed down the right  coronary artery.  We  used a traverse wire.  Predilatation was done with a 2.25 x 15 Maverick  balloon.  Stenting was then accomplished using a 2.75 x 24 Liberte  stent. Because of the need for Coumadin anticoagulation, and non-drug-  eluting platform was placed.  This was taken to 15 atmospheres and post  dilated throughout with a 3.25 Quantum Maverick balloon up to 15  atmospheres throughout the stent. The edges were dilated to about 8-10  atmospheres.  There was marked improvement in the appearance of the  artery.  After initial predilatation, there was slow flow associated  with significant ST elevation, but this was resolved with stenting.  Following an angiographic followup, catheter was removed and replaced by  a left guide.  A JL-3 left guide was utilized to intubate the left  coronary. Multiple views of the left coronary artery were then obtained  in multiple angiographic projections, and careful attention to detailed  discussed.  After thorough investigation, we elected to not proceed on  with the LAD in light of the patient's negative perfusion study in this  area.  All catheters and then subsequently removed,  the femoral sheath  was sewn into place, and he was taken to the holding area in  satisfactory clinical condition. Bivalirudin was discontinued.   ANGIOGRAPHIC DATA:  1. The right coronary artery has been previously stented, both at the      ostium and distally. As noted in the diagnostic study, there is a      critical stenosis proximal to the acute margin.  Following      stenting, this is reduced from 99 to 0% with smooth edges and no      evidence of edge tear.  There was zero residual stenosis.  The      distal vessel runoff was excellent.  In the proximal vessel, there      was about a 50-60% area of narrowing, but it appeared to be an      adequate lumen, and we did not feel this should be stented.  2. The left anterior descending artery demonstrates a  previously      demonstrated 70% stenosis.  In the LAO cranial view, this appears      to be somewhat less than this.  In the RAO cranial view, its      appears to be about 70%.  Importantly, there was a side branch that      comes out at the lesion site which represents of small to moderate      diagonal.  There is some ostial narrowing here.   CONCLUSION:  1. Successful percutaneous stenting of the right coronary artery.  2. Angiography of the left anterior descending artery.   DISPOSITION:  The patient will be treated with aspirin and Plavix.  He  will need Coumadin.  Permanent pacing is planned for tomorrow.  We will  reassess the LAD at some point in the near future, either by myocardial  perfusion imaging or possibly catheterization after he is stabilized.  This has been discussed with the family in detail.      Arturo Morton. Riley Kill, MD, [facility]  Electronically Signed     TDS/MEDQ  D:  03/11/2007  T:  03/11/2007  Job:  [id number]

## 2010-10-17 NOTE — Assessment & Plan Note (Signed)
[facility]                            CARDIOLOGY OFFICE NOTE   ALIM, CATTELL                    MRN:          [id number]  DATE:09/23/2007                            DOB:          18-May-1935    Mr. Saville is in for follow-up.  He really feels quite well.  He has  not been having any ongoing chest pain.  He has no dizziness.   His medications include:  1. Diltiazem 240 mg daily.  2. Coumadin.  3. Lipitor 80 mg daily.  4. Flomax 0.4 daily.  5. Multivitamin.  6. Prilosec.  7. Toprol XL 50 mg 1/2 tablet daily.  8. Aspirin 81 mg daily.   Most recent lipid profile in February revealed a total cholesterol of 93  with an LDL of 43.   Overall, the patient is doing well from a clinical standpoint.  His  numbers are excellent, and his liver functions are normal.  EKG today  reveals an irregular rhythm, and he has evidence of ventricular pacing  with underlying atrial fibrillation.  Interrogation of pacemaker, he is  in atrial fibrillation about 20% of the time.  He has a known history of  paroxysmal atrial fibrillation and mode switches at that time.   IMPRESSION:  1. Coronary disease status post percutaneous coronary intervention      with residual left anterior descending artery disease.  2. Hypercholesterolemia and lipid-lowering therapy.  3. Paroxysmal atrial fibrillation, controlled.  4. Status post permanent pacer implantation with perforation,      resolving.  5. Obesity.   PLAN:  1. Return to clinic in 3 months to reassess his pacing status.  2. Continue current medical regimen.  3. Continue follow-up with Dr. Darryll Capers.  4. Continue follow-up with Coumadin Clinic.     Arturo Morton. Riley Kill, MD, [facility]  Electronically Signed    TDS/MedQ  DD: 09/23/2007  DT: 09/23/2007  Job #: [id number]

## 2010-10-17 NOTE — Op Note (Signed)
NAME:  Shawn [name], Shawn [name] NO.:  [id number]   MEDICAL RECORD NO.:  [id number]          PATIENT TYPE:  INP   LOCATION:  6522                         FACILITY:  [facility]   PHYSICIAN:  Rollene Rotunda, MD, FACCDATE OF BIRTH:  1934-08-29   DATE OF PROCEDURE:  03/11/2007  DATE OF DISCHARGE:                               OPERATIVE REPORT   PRIMARY:  Dr. Darryll Capers.   CARDIOLOGIST:  Dr. Bonnee Quin.   PREOPERATIVE DIAGNOSIS:  Tachybrady syndrome.   POSTOPERATIVE DIAGNOSIS:  Tachybrady syndrome.   PROCEDURE:  Implantation of a dual-chamber permanent transvenous  pacemaker.   PROCEDURE NOTE:  Appropriate informed consent was obtained after I  explained all the risks and benefits of the procedure to the patient in  detail.  He was brought to the EP lab.  The right subclavian area was  prepped and draped in a sterile fashion.  The subcutaneous tissue was  anesthetized with 1% lidocaine 25 mL.  A 5 cm incision was made through  the skin.  The incision was carried down to the prepectoral fascia with  predominantly blunt dissection.  A pocket was formed above the  prepectoral fascia.  The pocket was inspected for hemostasis and there  was no frank bleeding though with his Plavix there was some mild diffuse  oozing.  Attention was turned to cannulating the subclavian vein.  This  was done under fluoroscopy.  I did perform a venography which  demonstrated a patent subclavian vein emptying into the superior vena  cava.  Under fluoroscopic guidance the subclavian vein was cannulated  using an anterior wall puncture.  I used two separate punctures for a  retained wire technique.  The wires were inserted modified Seldinger  technique.  Safe sheaths were utilized and placed via the modified  Seldinger technique.  Attention was first turned to the ventricular  lead.  This was a Medtronic 5076, 58 cm lead [id number].  This was  placed under fluoroscopic guidance into the right  ventricular apex.  The  measurements demonstrated ventricular voltage 10.5 mV, impedance 1005  ohms, threshold was 0.9 volts of 0.5 milliseconds, current was 1.0 mA.  There was no diaphragmatic stimulation at 10 volts.  Attention was then  turned to the atrial lead which was inserted into the right atrial  appendage under fluoroscopic guidance.  The P-wave measured 8.34 mV.  Impedance was 937 ohms.  Threshold was 0.8 volts at 0.5 milliseconds.  Current 1.2 mA.  There was no diaphragmatic stimulation at 10 volts.  The entire system was again visualized fluoroscopically with excellent  lead placement.  I then sutured the leads to the prepectoral fascia  using the sewing sleeves and pop-off silk sutures.  I irrigated the  pocket copiously with Kanamycin solution.  Visually there were no frank  bleeders though there was diffuse oozing from the subcutaneous tissue.  I did use Surgicel for hemostasis.  I then attached the leads to an  Enrhythm Medtronic P1501 DR generator, serial number [id number].  This  was demonstrated to function properly.  The generator was then sutured  to the prepectoral fascia using silk  pop-off sutures.  Again I inspected  the pocket and so only superficial oozing.  This was not copious and  looked like it would achieve excellent hemostasis once the pocket was  securely sutured.  I then turned my attention towards suturing the  pocket in three layers.  First was a 2-0 Vicryl in a running  vertical mattress.  The second was the same Vicryl in a running  horizontal mattress.  The third layer subcutaneous was 4-0 Vicryl in a  running horizontal mattress.  The wound was then coated with Benzoin and  Steri-Strips and pressure packing placed.  There were no apparent  complications.  The patient left the lab in stable condition.      Rollene Rotunda, MD, [facility]  Electronically Signed     JH/MEDQ  D:  03/11/2007  T:  03/11/2007  Job:  [id number]   cc:   Stacie Glaze, MD   Arturo Morton. Riley Kill, MD, [facility]

## 2010-10-17 NOTE — Assessment & Plan Note (Signed)
[facility]                            CARDIOLOGY OFFICE NOTE   Shawn, Jeune THEADOR [name]                    MRN:          [id number]  DATE:03/31/2007                            DOB:          11/06/1934    [name] is in for followup.  Last week he had one little episode of  dizziness, but overall he has been feeling well.  He denies any chest  pain, he denies any inspiratory chest pain.  He underwent percutaneous  stenting using a non drug-eluting platform.  Importantly, he then had a  pacemaker placed as backup for bradycardia in the setting of underlying  atrial arrhythmias.  This was placed by Dr. Antoine Poche.  He was thought to  have a microperforation, had a pericardial effusion.  He has gradually  improved.  He unfortunately is on longterm Coumadin anti-coagulation for  atrial fibrillation, and is on triple drug therapy including aspirin,  clopidogrel and Coumadin.   CURRENT MEDICATIONS:  1. Toprol XL 50 mg 1-1/2 tablets daily.  2. Aspirin 325 mg daily.  3. Plavix 75 mg daily.  4. Prilosec 20 mg daily.  5. Multivitamin 1 daily.  6. Flomax 0.4 mg daily.  7. Lipitor 80 mg daily.  8. Coumadin as directed.  9. Diltiazem 240 mg daily.   PHYSICAL:  The blood pressure is 116/60, the pulse is 62.  LUNGS:  Fields are clear.  CARDIAC:  Rhythm is regular.  There is no rub.  There is no extremity edema.  The jugular veins are not distended.   The electrocardiogram demonstrates normal sinus rhythm, no acute changes  are noted.  The T waves are flat.   IMPRESSION:  1. Coronary artery disease with subsequent percutaneous coronary      intervention to the right coronary artery using a non drug-eluting      platform.  2. Coumadin anticoagulation, due to atrial fibrillation.  3. Sick sinus syndrome, status post permanent pacemaker implantation.  4. Microperforation.     Arturo Morton. Riley Kill, MD, [facility]  Electronically Signed    TDS/MedQ  DD:  03/31/2007  DT: 03/31/2007  Job #: [id number]

## 2010-10-17 NOTE — Discharge Summary (Signed)
NAME:  Shawn [name], Shawn [name] NO.:  [id number]   MEDICAL RECORD NO.:  [id number]          PATIENT TYPE:  INP   LOCATION:  3740                         FACILITY:  [facility]   PHYSICIAN:  Rollene Rotunda, MD, FACCDATE OF BIRTH:  1935/01/29   DATE OF ADMISSION:  03/04/2007  DATE OF DISCHARGE:  03/17/2007                               DISCHARGE SUMMARY   PROCEDURES:  1. Cardiac catheterization.  2. Coronary arteriogram.  3. Left ventriculogram.  4. Recatheterization with PTCA and bare metal stent to the RCA.  5. Implantation of a dual-chamber permanent transvenous pacemaker.  6. A 2-D echocardiogram.  7. Relook echocardiogram (follow up pericardial effusion).   FINAL PRIMARY DISCHARGE DIAGNOSIS:  Unstable anginal pain.   SECONDARY DIAGNOSES:  1. Paroxysmal atrial fibrillation with tachy-brady syndrome.  2. Pericardial effusion.  3. Anticoagulation with Coumadin on hold secondary to pericardial      effusion.  4. Mild anemia with a hemoglobin of 12.0 and hematocrit of 34.9 this      admission.  5. Status post PTCA and stent x2 to the RCA in 2004 (Taxus stent).  6. Hypertension.  7. Obesity.  8. History of back surgery and a cyst to the right side of his face      removed.   TIME OF DISCHARGE:  Forty seven minutes.   HOSPITAL COURSE:  Shawn [name] is a 76 year old male with a history of  coronary artery disease.  He came to the hospital on March 04, 2007,  with chest pain associated with diaphoresis and nausea.  He had an  outpatient adenosine Myoview and on the Myoview, he had inferolateral  ischemia.  He was evaluated in the office and admitted for  catheterization and further treatment.   His INR on admission was 2.4.  It was allowed to trend down as he was  not in acute distress.  His INR was subtherapeutic by March 07, 2007,  then he had a cardiac catheterization showing a 60-70% LAD, 95% mid RCA  and patent distal stent.  He had some inferior hypokinesis on  the left  ventriculogram with preserved global systolic function.  Because of some  abdominal pain, the etiology of which was unclear, Dr. Riley Kill felt that  intervention should be staged.  The intervention was performed on  March 11, 2007, with a bare metal stent reducing the stenosis from 99%  to zero.  The other vessels were felt not to be flow limiting and could  be treated medically.   Shawn [name] has a history of paroxysmal atrial fibrillation.  He was  noted to have issues with bradycardia at times and with tachycardia.  He  was evaluated by Dr. Graciela Husbands who felt that a pacemaker was indicated.  A  dual lead Medtronic and rhythm pacemaker was inserted on March 11, 2007, with no apparent complications.  He was on aspirin and Plavix, but  the heparin and Coumadin were held post pacemaker.   A 2-D echo was performed and showed a pericardial effusion.  It was felt  that he did not have Tamponade, and no pericardial drain or window was  indicated.  He had no chest pain or shortness of breath, and no rub was  audible on the heart sounds.  It was felt that he needed to be observed.   Shawn [name] did well over the next 48 hours.  His pacemaker was  rechecked on March 17, 2007 and showed normal device function with no  changes needed.  His pacer site remained nontender and stable.  Dr.  Antoine Poche evaluated him on March 17, 2007, and felt he could be  discharged home with close outpatient follow-up.   DISCHARGE INSTRUCTIONS:  His activity level is to be increased per the  activity sheet.  He is to call our office for any problems with the  incision.  He is to get an echocardiogram on Thursday at 1:00 p.m.  October 15 and get a CBC at that time.  He is to get a pacer check on  October 22 at 9:20 and see Dr. Riley Kill on October 22 at 10:00 a.m..  He  will follow up with Dr. Antoine Poche as well and will be transferred to the  [facility] office for follow-up at his request.   DISCHARGE  MEDICATIONS:  1. Enteric-coated aspirin 325 mg daily for 30 days.  2. Plavix 75 mg daily for 30 days.  3. Coumadin is on hold for now.  4. Lipitor 80 mg q.h.s.  5. Flomax 0.4 mg daily.  6. Centrum Silver daily.  7. Prilosec OTC daily.  8. Cardizem CD 240 mg daily.  9. Toprol XL 50 mg daily.  10.Sublingual nitroglycerin p.r.n.Marland Kitchen  11.Digoxin 0.25 mg q. day.      Theodore Demark, PA-C      Rollene Rotunda, MD, [facility]  Electronically Signed    RB/MEDQ  D:  03/17/2007  T:  03/18/2007  Job:  [id number]   cc:   Stacie Glaze, MD

## 2010-10-17 NOTE — Assessment & Plan Note (Signed)
[facility]                            CARDIOLOGY OFFICE NOTE   Shawn [name], Shawn [name]                    MRN:          [id number]  DATE:06/30/2008                            DOB:          1935-05-26    [name] is in for followup.  In general, he is really doing quite  well.  His INR today is 2.0.  He denies any current or progressive chest  pain.  His last echocardiographic study was in January 2009, at which  time it eventually had shown resolution of his pericardial effusion.  There was mild right ventricular dilatation.  The patient continues to  do well.  Fortunately, his coronary was treated with a 2.75 x 24 Liberte  stent, and thus nondrug-eluting because he is on Coumadin  anticoagulation for his atrial arrhythmia.   Medications include:  1. Diltiazem 240 mg daily.  2. Coumadin as directed.  3. Flomax 0.4 mg daily.  4. Multivitamin daily.  5. Prilosec 20 mg daily.  6. Toprol-XL 50 mg one and a half daily.  7. Aspirin 81 mg daily.  8. Lipitor 40 mg daily.   On physical, he is alert and oriented in no distress today.  The blood  pressure is down a bit to 100/70, pulse is 72.  The lung fields are  clear, and the cardiac rhythm is regular.  When rechecked by me, the  blood pressure was 110 systolic.  The lung fields are actually quite  clear.   EKG reveals atrial fibrillation underlying his ventricular pacing.  This  is different from his last EKG in October, but not unlike what he had in  July.  He was also in atrial fib in April.   Fortunately, his rate is well maintained and he is not all that  symptomatic with this.  He has a long history of paroxysmal atrial  fibrillation and currently this remains stable.  Because the pressure is  down, it would will be worthwhile to recheck a CBC to make sure that he  is not significantly anemic.  Hemoglobin by Dr. Antoine Poche late in 2008  did reveal a hemoglobin of 12.7, so this will need to be  rechecked.     Arturo Morton. Riley Kill, MD, [facility]  Electronically Signed    TDS/MedQ  DD: 07/05/2008  DT: 07/05/2008  Job #: [id number]

## 2010-10-17 NOTE — Assessment & Plan Note (Signed)
[facility]                            CARDIOLOGY OFFICE NOTE   Klein, Willcox PHUC KLUTTZ                    MRN:          [id number]  DATE:05/22/2007                            DOB:          1935-01-09    [name] is in for a followup visit.  He is really doing quite well.  He has not been having any chest pain.  Occasionally he will have a real  sharp, quick chest pain, but nothing that sounds ischemic.   MEDICATIONS:  1. Diltiazem 240 mg daily.  2. Coumadin as directed.  3. Lipitor 80 mg daily.  4. Flomax 0.4 mg daily.  5. Multivitamin daily.  6. Prilosec 20 mg daily.  7. Aspirin 81 mg daily.  8. Toprol XL 50 mg 1-1/2 tablets daily.   PHYSICAL:  He is alert and oriented in no distress.  Blood pressure 110/70, pulse 72.  There is no paradox.  There is no significant jugular venous distension.  I do not appreciate a rub on exam.  The cardiac rhythm is regular with  normal first and second heart sounds.  The lung fields are clear to auscultation and percussion.   At the present time, Shawn [name] is stable from a cardiac standpoint.  I  see no specific suggestion of tamponade at the present time.  We plan to  see him back in followup in about 1 month.  At that time, we will get a  chest x-ray and a repeat 2D echocardiogram to hopefully demonstrate  complete resolution of his pericardial effusion.  Should he have any  problems in the interim, he is to call me.     Arturo Morton. Riley Kill, MD, [facility]  Electronically Signed    TDS/MedQ  DD: 05/22/2007  DT: 05/22/2007  Job #: [id number]

## 2010-10-17 NOTE — Assessment & Plan Note (Signed)
[facility]                            CARDIOLOGY OFFICE NOTE   JARTAVIOUS, MCKIMMY                    MRN:          [id number]  DATE:09/23/2007                            DOB:          01-29-1935    Mr. Klingberg is in for followup.  He generally is feeling quite well.  He  denies any chest pain or significant shortness of breath.   MEDICATIONS:  1. Diltiazem 240 mg daily.  2. Coumadin as directed.  3. Lipitor 80 mg daily.  4. Flomax 0.4 mg daily.  5. Prilosec 20 mg daily.  6. Toprol-XL 50 mg one and a half tablets daily.  7. Aspirin 81 mg daily.   PHYSICAL EXAMINATION:  His weight is 198 pounds.  Blood pressure 127/70  and pulse is 80.  Lung fields clear.  No rub is noted.   Underlying EKG reveals atrial fibrillation about 21% of the time, but  with a ventricular response, which is well controlled.   He will continue on his current medical regimen.  He remains well  controlled on the current drug therapy, and does not go too fast,  despite mode switching.  He is relatively asymptomatic with all of this.  We will continue to follow him.     Arturo Morton. Riley Kill, MD, [facility]  Electronically Signed    TDS/MedQ  DD: 09/27/2007  DT: 09/27/2007  Job #: [id number]

## 2010-10-17 NOTE — Assessment & Plan Note (Signed)
[facility]                            CARDIOLOGY OFFICE NOTE   ROCKLIN, SODERQUIST                    MRN:          [id number]  DATE:04/16/2007                            DOB:          [birth year]    [name] is in for followup, he really is doing quite well.  The  inspiratory chest pain that he had previously is essentially gone.  He  is able to walk up the hill now without significant shortness of breath.  He does have, what he describes, a crick in his neck, and this may be  from arthritis.   EXAMINATION:  Weight is 193, blood pressure 136/70, pulse is 60.  There  is absolutely no jugular venous distention presently.  LUNGS:  Fields are clear to auscultation and percussion.  I can appreciate the heart sounds.  EXTREMITIES:  Do not reveal significant edema.   We talked about getting an echocardiogram next week but the patient  wants to go to the beach.  There is no signs of hemodynamic compromise  at present and his effusion was smaller.  He has been clinically  improved.  We will therefore have him come in 2 weeks when he returns.  I will see him back in followup in about 1 month.  He will continue to  follow up with Dr. Lovell Sheehan.  We will see him in 1-2 months.     Shawn [name]. Riley Kill, MD, [facility]  Electronically Signed    TDS/MedQ  DD: 04/16/2007  DT: 04/17/2007  Job #: [id number]

## 2010-10-17 NOTE — H&P (Signed)
NAME:  ROSSI, BURDO NO.:  [id number]   MEDICAL RECORD NO.:  [id number]          PATIENT TYPE:  INP   LOCATION:  3740                         FACILITY:  [facility]   PHYSICIAN:  Rollene Rotunda, MD, FACCDATE OF BIRTH:  1934/10/04   DATE OF ADMISSION:  03/04/2007  DATE OF DISCHARGE:  03/17/2007                              HISTORY & PHYSICAL   PRIMARY CARE PHYSICIAN:  Dr. Darryll Capers.   REASON FOR PRESENTATION:  Evaluate patient with pericardial effusion  status post pacemaker placement.   HISTORY OF PRESENT ILLNESS:  The patient came back today to have follow-  up of a pericardial effusion with an echocardiogram.  Had a pacemaker  placed on October 7 without apparent complications.  However, the day  following, he had a slight pleuritic pain.  An echocardiogram did  demonstrate small pericardial effusion.  Follow-up echocardiogram the  next day suggested that this was perhaps slightly larger or stable.  He  had normal pacing thresholds, and by that time had had no symptoms.  He  denied any shortness of breath.  He had no chest discomfort.  He was  having no hypertension and no pulses paradox.  Was discharged from the  hospital.  Incidentally, he was on Plavix because during his  hospitalization he had bare mental stent placed.  Indication for the  pacemaker was tachybrady syndrome.  He was in atrial fibrillation with  controlled rate at the time of discharge.  Coumadin was not restarted.  Today he was brought back in for an echocardiogram and clearly has a  larger pericardial effusion.  It is bordering on large.  It is  circumferential.  There is no clear respiratory variation across the  mitral valve.  There is some mild right ventricular diastolic collapse.  However, again, the patient is not having any symptoms.  He has no  pulses paradoxus on physical exam.  He denies any shortness of breath.  Does not have any PND or orthopnea.  He has not been having any  chest  discomfort.  The pacemaker thresholds were again interrogated today and  found to be stable compared to the thresholds at implantation.   PAST MEDICAL HISTORY:  Paroxysmal atrial fibrillation with tachybrady  syndrome, status post total chamber Medtronic pacemaker placement with  active fixation leads and apparent microperforation with resultant  pericardial effusion, mild anemia, hypertension, coronary artery disease  (LAD had a 70% stenosis.  AV circumflex was free of critical disease,  right coronary artery had a previous stent.  There was 30-40% narrowing  followed by subtotal occlusion of 95% in the mid vessel.  The distal  vessel was stented and patent.  The patient had a non-drug eluding stent  per Dr. Riley Kill.)   PAST SURGICAL HISTORY:  1. Back surgery.  2. Cyst removed from right side of the face.   ALLERGIES:  None.   MEDICATIONS:  1. Aspirin 325 mg daily.  2. Plavix 75 mg daily.  3. Lipitor 80 mg q.h.s.  4. Flomax 0.4 mg daily.  5. Centrum Silver.  6. Prilosec.  7. Cardizem CD 240 mg daily.  8. Toprol 50 mg daily.  9. Sublingual nitroglycerin.  10 . Digoxin 0.25 mg daily.   SOCIAL HISTORY:  The patient is married.  He is retired.  He has two  children.   FAMILY HISTORY:  Is noncontributory for early coronary disease.   REVIEW OF SYSTEMS:  As stated in HPI, otherwise, negative for other  systems.   PHYSICAL EXAMINATION:  GENERAL:  The patient is in no distress.  VITAL SIGNS:  Blood pressure 105/70, heart rate 60 and regular,  afebrile.  HEENT:  Eyes unremarkable.  Pupils equal, round, reactive to light.  Fundi not visualized.  Mucosa unremarkable.  NECK:  No jugular distention at 45 days, carotid upstroke brisk and  symmetric, no bruits, thyromegaly.  LUNGS:  Clear to auscultation bilaterally.  BACK:  No costovertebral angle tenderness.  CHEST:  Well-healed pacemaker pocket.  HEART:  S1-S2 within normal.  No clicks, rubs, murmurs.  ABDOMEN:  Obese,  positive bowel sounds, normal in frequency and pitch.  No bruits, rebound, guarding or midline pulsatile mass.  No  organomegaly.  SKIN:  No rashes.  EXTREMITIES:  Pulses 2+, no edema.   ASSESSMENT/PLAN:  1. Pericardial effusion.  The patient has now an enlarging pericardial      effusion.  It is clear that this non-presumed microperforation is      not sealing itself, most likely exacerbated by Plavix.  It is      unclear which lead this might be.  Therefore, he will need to have      a leads repositioned.  I have discussed this with Dr. Ladona Ridgel and      Dr. Graciela Husbands.  Dr. Ladona Ridgel will be available to do this tomorrow      morning in the lab.  The patient understands the risks, benefits      and agrees to proceed.  The fusion we will watch.  If he indeed has      no enlargement of this, we might be able to watch this expectantly      for slow resolution.  There is a slight possibility he may need a      pericardiocentesis.  2. Coronary disease, he will continue on Plavix.  3. Atrial fibrillation, he is back in sinus rhythm.  For now, we will      hold on the Coumadin.      Rollene Rotunda, MD, [facility]  Electronically Signed     JH/MEDQ  D:  03/20/2007  T:  03/21/2007  Job:  [id number]   cc:   Stacie Glaze, MD

## 2010-10-17 NOTE — Letter (Signed)
December 25, 2007    Stacie Glaze, MD  [address] [place], [place] [address]   RE:  DAELAN, GATT  MRN:  [id number]  /  DOB:  Aug 19, 1934   Dear Jonny Ruiz,   I had the pleasure of seeing Mr. Pals in the office today in  followup.  In general, he is doing extremely well.  He mows his grass,  plays golf, and has very little limitation of what he is doing.  He  denies any ongoing chest pain.  He has not had any recurrent symptoms.  As you know in October 2008, he underwent percutaneous stenting of the  right coronary artery.  He does not notice going in and out of atrial  fibrillation.   His medications currently include,  1. Diltiazem 240 mg daily.  2. Coumadin as directed.  3. Lipitor 80 mg daily.  4. Flomax 0.4 daily.  5. Multivitamin daily.  6. Prilosec 20 mg daily.  7. Toprol-XL 50 mg 1-1/2 tablets daily.  8. Aspirin 81 mg daily.   On physical exam, he is alert and oriented in no distress.  Blood  pressure is 122/76 and pulse is 71.  The lung fields are clear.  The  cardiac rhythm is regular without significant rub or murmur.   His EKG reveals atrial fibrillation with controlled ventricular  response, rate 71.  There is occasional ventricular pacing.   Mr. Shuey goes in and out of atrial fibrillation and currently he is  in atrial fib.  In addition, he has recently had his prescription  refilled from your office which was refill as Lipitor 40 as opposed to  80.  In retrospect, I think this will be satisfactory as his LDL  cholesterol on his last check was 43.  Moreover, he is out now almost 2  years from his cardiac event and also he is on diltiazem which makes the  lower dose of Lipitor possibly preferable.  He is doing great, and I  would simply recheck his lipid profile on this as your office has  already called in this prescription.  I confirmed with him today that he  could go down to Lipitor 40.  We will see him back in Cardiology in 6  months' time.  I  appreciate the opportunity of sharing in his care.    Sincerely,      Arturo Morton. Riley Kill, MD, [facility]  Electronically Signed    TDS/MedQ  DD: 12/25/2007  DT: 12/26/2007  Job #: [id number]

## 2010-10-17 NOTE — Assessment & Plan Note (Signed)
[facility]                            CARDIOLOGY OFFICE NOTE   TRUTH, BAROT                    MRN:          [id number]  DATE:06/12/2007                            DOB:          03/22/1935    PRIMARY:  Dr. Darryll Capers   REASON FOR ADMISSION:  Patient with pacemaker and subsequent pericardial  effusion.   HISTORY OF PRESENT ILLNESS:  The patient is a very pleasant, 76 year old  gentleman who had a pacemaker placed on March 11, 2007.  He  subsequently was found to have pericardial effusion and I suspect a  microperforation.  This was placed because of tachybrady syndrome.  He  was on Plavix following a PCI.  He subsequently had to have lead  revision.  He remained asymptomatic with his effusion except for initial  chest discomfort the day after the procedure.   He returns for followup.  He has been doing quite well.  Last ejection  fraction on April 30, 2007 demonstrated a small pericardial effusion  circumferential to the heart.   He is not having any symptoms.  He denies any chest discomfort, neck or  arm discomfort.  He is having no palpitations, presyncope, or syncope.  He has had no fevers, or chills, or cough.   PAST MEDICAL HISTORY:  Tachybrady syndrome.  Status post pacemaker  placement (lead revision per Dr. Ladona Ridgel), paroxysmal atrial  fibrillation, mild anemia, hypertension, coronary artery disease (LAD  70% stenosis, AV circumflex free of disease, right coronary artery had a  previous stent, 30% to 40% narrowing followed by subtotal occlusion, 95%  in the mid vessel.  The vessel was stented with a non drug-eluting stent  per Dr. Riley Kill ), back surgery, cyst removed from the right side of his  face.   ALLERGIES:  NONE.   MEDICATIONS:  1. Diltiazem 240 mg daily.  2. Coumadin.  3. Lipitor 80 mg daily.  4. Flomax.  5. Multivitamins.  6. Prilosec.  7. Aspirin 81 mg daily.  8. Toprol 75 mg daily.   REVIEW OF  SYSTEMS:  As stated in the HPI and otherwise negative for  other systems.   PHYSICAL EXAMINATION:  The patient is no distress.  Blood pressure  133/69.  Heart rate 60 and regular.  HEENT:  Eyes unremarkable.  Pupils equal, round, react to light.  Fundi  not visualized.  Mucosa unremarkable.  NECK:  No jugular venous distension at 45 degrees, carotid upstroke  brisk and symmetric.  No bruits, thyromegaly.  LYMPHATICS:  No nodes.  LUNGS:  Clear to auscultation without wheezing or dullness to  percussion.  CHEST:  Well-healed pacemaker pocket.  HEART:  PMI not displaced or sustained.  S1 and S2 within normal limits.  No S3,  no S4, no clicks, no rubs, no murmurs.  ABDOMEN:  Obese.  Positive bowel sounds.  Normal in frequency, pitch.  No bruits, no rebound.  No guarding.  No midline pulsatile mass, no  organomegaly.  SKIN:  No rash, no nodules.  EXTREMITIES:  2+ pulses, no edema.   EKG, atrial-paced rhythm.   PACEMAKER INTERROGATION:  Atrial  amplitude is 4.5, impedance 472,  threshold 1 volt at 0.4 milliseconds.  The right ventricular amplitude  is 4.5, impedance 488, threshold 0.4 volts at 0.4 milliseconds.  The  patient has mode switching 92% of the time with atrial fibrillation.  The programming changes, the atrial voltage was reduced to 2, the  ventricular voltage reduced to 2.5.   ASSESSMENT AND PLAN:  1. Tachybrady syndrome.  The patient's pacemaker seems to be      functioning well.  We made the changes as above.  He has had no      symptoms related to his pericardial effusion.  It was small at the      last echocardiogram.  He has another echocardiogram ordered by Dr.      Riley Kill.  He is to see Dr. Riley Kill later this month.  I will make      no change to his regimen.  2. Atrial fibrillation.  Remains on the Coumadin.  He has appropriate      mode functioning.  3. Coronary artery disease per Dr. Riley Kill.  4. Followup:  I will see him back in October at the 1 year  anniversary      of his pacemaker.  We will arrange transtelephonic monitoring.     Rollene Rotunda, MD, [facility]  Electronically Signed    JH/MedQ  DD: 06/12/2007  DT: 06/13/2007  Job #: [id number]   cc:   Stacie Glaze, MD

## 2010-10-17 NOTE — Assessment & Plan Note (Signed)
[facility]                            CARDIOLOGY OFFICE NOTE   SLATON, REASER                    MRN:          [id number]  DATE:06/30/2007                            DOB:          June 29, 1934    Mr. Eskew is in for follow-up.  He really is quite stable.  He saw Dr.  Antoine Poche recently had a 2-D echocardiogram today, this does not  demonstrate a significant pericardial effusion.  The study was read by  Dr. Myrtis Ser, and the report has been given to the patient.  In general, the  patient feels well.  He denies chest pain or shortness of breath.  He  had a fair amount of atrial fibrillation noted.   Today the weight is 196 pounds.  Blood pressure 128/78, pulse 64.  The lung fields are actually quite clear.  The cardiac rhythm is regular.  There is not a definite significant  murmur noted.  Importantly, one can appreciate some mild TR in jugular  veins, and this is noted on the 2-D echocardiogram was well along the  pacemaker site.  The extremities do not reveal significant edema.   IMPRESSION:  1. Tachybrady syndrome status post permanent pacer implantation      recently readjusted by Dr. Antoine Poche.  2. Atrial fibrillation under controlled ventricular response.  3. Prior percutaneous coronary intervention of the right coronary      artery.   PLAN:  1. Return to clinic in 4 months.  2. Continued follow-up with Dr. Darryll Capers.     Arturo Morton. Riley Kill, MD, [facility]  Electronically Signed    TDS/MedQ  DD: 06/30/2007  DT: 06/30/2007  Job #: [id number]   cc:   Stacie Glaze, MD

## 2010-10-20 NOTE — Discharge Summary (Signed)
NAME:  Shawn [name], Shawn [name] NO.:  [id number]   MEDICAL RECORD NO.:  [id number]                   PATIENT TYPE:  OIB   LOCATION:  6533                                 FACILITY:  [facility]   PHYSICIAN:  Salvadore Farber, M.D.             DATE OF BIRTH:  March 29, 1935   DATE OF ADMISSION:  11/12/2002  DATE OF DISCHARGE:  11/13/2002                           DISCHARGE SUMMARY - REFERRING   DISCHARGE DIAGNOSES:  1. One vessel coronary artery disease.     A. Status post Taxis stenting to the right coronary artery x2.     B. Residual disease; 25% proximal left main, 25% proximal left anterior        descending. Left ventriculogram with no wall motion abnormalities, no        mitral regurgitation, and ejection fraction of 65%. Distal aortogram        normal.  2. Paroxysmal atrial fibrillation/flutter.  3. Chronic Coumadin therapy.  4. Treated hyperlipidemia.  5. Obesity.   PROCEDURE:  1. Cardiac catheterization by Vida Roller, M.D. on November 12, 2002.  2. Percutaneous coronary intervention by Salvadore Farber, M.D. on November 12, 2002.   HOSPITAL COURSE:  Please see the dictated note by E. Graceann Congress, M.D. on  November 11, 2002, for complete details.  Briefly, this 76 year old gentleman  with a history of paroxysmal atrial fibrillation/flutter was evaluated for  chest discomfort with radiation to his left arm with a Cardiolite. This was  abnormal and he was set up for cardiac catheterization.  Toprol 50 mg and  Lipitor 40 mg were added on June 9.  He reported to [facility] on  November 12, 2002, for cardiac catheterization.  He underwent the procedures  named above. He tolerated the procedures well and had no immediate  complications.  He did have some minor gum-bleeding and he was treated post  catheterization with the Integrelin.  On the morning of November 13, 2002, his  right groin was stable without any hematomas or bruits.  Post  catheterization CK-MB  was negative.  Post catheterization EKG was negative.  He will be discharged to home in stable condition.  His Coumadin will be  restarted on the night of discharge. This will be rechecked with Dr. Lovell Sheehan  early next week.  Once his INR is therapeutic, he can decrease his aspirin  back to 80 mg daily.  He will be on Plavix for at least six months.   LABORATORY DATA:  Hemoglobin 13.5, hematocrit 38.6, platelet count 176,000,  white count 10,500. Sodium 138, potassium 4, BUN 13, creatinine 1.2, glucose  115, chloride 106, CO2 28, INR 1.2.   Admission chest x-ray negative.   DISCHARGE MEDICATIONS:  1. Enteric-coated aspirin 325 mg daily.  2. Plavix 75 mg daily for at least six months.  3. Toprol XL 50 mg daily.  4. Flomax 0.4 mg daily.  5. Ranitidine 150  mg daily.  6. Lipitor 40 mg q.h.s.  7. Critia XT 240 mg daily.  8. Coumadin to be restarted tonight.  9. Nitroglycerin p.r.n. chest pain.   PAIN MANAGEMENT:  Tylenol as needed and nitroglycerin as needed for chest  pain.   He is to call our office or 911 with recurrent chest pain.   ACTIVITY:  No driving, heavy lifting, exertion, sex, or work for three days.  Slowly advance as tolerated.   DIET:  Low fat, low sodium.   WOUND CARE:  The patient is to call our office in [facility] for any groin  swelling, bleeding, or bruising.   FOLLOW UP:  The patient will have his PT/INR checked at Dr. Lovell Sheehan' office  on Monday June 14, at 2 p.m. at the [place] office. He will follow up  with E. Graceann Congress, M.D. on June 22, at 12 noon at the [facility]  office.     Tereso Newcomer, P.A.                        Salvadore Farber, M.D.    SW/MEDQ  D:  11/13/2002  T:  11/13/2002  Job:  [id number]   cc:   Cecil Cranker, M.D.   Stacie Glaze, M.D. [facility]

## 2010-10-20 NOTE — Cardiovascular Report (Signed)
NAME:  Shawn [name], ASHE NO.:  [id number]   MEDICAL RECORD NO.:  [id number]                   PATIENT TYPE:  OIB   LOCATION:  2866                                 FACILITY:  [facility]   PHYSICIAN:  Salvadore Farber, M.D.             DATE OF BIRTH:  1935-01-26   DATE OF PROCEDURE:  11/12/2002  DATE OF DISCHARGE:                              CARDIAC CATHETERIZATION   PROCEDURE:  Stent x2 to the right coronary artery.   INDICATIONS:  The patient is a 76 year old gentleman presenting with recent  exertional angina.  Cardiolite demonstrated inferior ischemia.  He was  referred for cardiac catheterization performed by Vida Roller, M.D.  This  demonstrated mild to moderate disease of the left system.  In the proximal  RCA there was a 70% stenosis.  In the mid portion of the RCA there was a  separate 80% stenosis.  I was asked to perform percutaneous  revascularization.   PROCEDURAL TECHNIQUE:  Informed consent was obtained.  Via the preexisting 6-  French right femoral arterial sheath a JR4 guide was advanced over a wire  and engaged in the ostium of the RCA.  Anticoagulation was initiated with  heparin and eptifibatide to achieve an ACT of greater than 200 seconds.  Further angiography after the administration of nitroglycerin confirmed the  severity of the proximal lesion and ruled out spasm.  A Luge wire was  advanced to the distal RCA without difficulty.  The more distal lesion was  directly stented using a 3.0 x 16 mm Taxus.  It was then post dilated to  3.25 mm using a 3.25 x 15 mm PowerSail at 14 atmospheres.  Attention was  then turned to the lesion in the proximal vessel.  This was directly stented  using a 3.0 x 12 mm Taxus.  The proximal portion of the stent was then post  dilated using a 3.25 x 8 mm PowerSail.  The distal portion of the stent sat  in a slightly aneurysmal segment of the vessel.  It was therefore post  dilated using a 3.75 x 8 mm  PowerSail at 16 atmospheres to flare it to 3.9  mm.  Final angiograms demonstrated no residual stenosis, complete stent  apposition, no dissection, and TIMI 3 flow to the distal vessel.   COMPLICATIONS:  None.   IMPRESSION/RECOMMENDATIONS:  Successful stenting to right coronary artery  lesions resulting in no residual stenosis and TIMI 3 flow to the distal  vasculature.  Sheaths to be removed when the ACT is less than 150 seconds.  Eptifibatide will be continued for 18 hours.  Plavix should be continued for  a minimum of six months given the drug eluding stents utilized.  Aspirin  should be continued indefinitely.  Salvadore Farber, M.D.    WED/MEDQ  D:  11/12/2002  T:  11/13/2002  Job:  [id number]   cc:   Cecil Cranker, M.D.   Stacie Glaze, M.D. [facility]

## 2010-10-20 NOTE — Cardiovascular Report (Signed)
NAME:  Shawn [name], Shawn [name] NO.:  [id number]   MEDICAL RECORD NO.:  [id number]                   PATIENT TYPE:  OIB   LOCATION:  2866                                 FACILITY:  [facility]   PHYSICIAN:  Vida Roller, M.D.                DATE OF BIRTH:  09/24/1934   DATE OF PROCEDURE:  11/12/2002  DATE OF DISCHARGE:                              CARDIAC CATHETERIZATION   PRIMARY CARE Eldonna Neuenfeldt:  Dr. Lovell Sheehan.   PRIMARY CARDIOLOGIST:  Cecil Cranker, M.D.   HISTORY OF PRESENT ILLNESS:  Mr. Lord is a 76 year old male with  hypertension, hyperlipidemia, typical chest pain, and an abnormal perfusion  study with inferior ischemia and normal left ventricular function.  He was  taken to the catheterization lab electively.   DETAILS OF THE PROCEDURE:  After obtaining informed consent, the patient was  brought to the cardiac catheterization laboratory in the fasting state.  There he was prepped and draped in the usual sterile manner.  Local  anesthetic was obtained over the right groin using 1% lidocaine without  epinephrine.  The right femoral artery was cannulated using modified  Seldinger technique with a 6 French 10 cm sheath and left heart  catheterization was performed using a 6 French Judkins left #4 and a 5  [place] Judkins right #4.  Left ventriculography was performed with a 6  French angle pigtail catheter and the pigtail catheter was used also for the  distal aortography.  At the conclusion of the procedure, the sheath was sewn  in place and the patient underwent a percutaneous coronary intervention to  his right coronary artery under the direction of Salvadore Farber, M.D.   RESULTS:  The aortic pressure was measured at 132/68 with a mean arterial  pressure of 96 mmHg.   The left ventricular pressure was measured at 138/10 with an end-diastolic  pressure of 24 mmHg.   CORONARY ANATOMY:  The left main coronary artery was a normal sized vessel  that has mild plaquing in its proximal portion with approximately a 25%  ostial obstruction.   The left anterior descending coronary artery is a moderate caliber vessel  that has a 25% stenosis in its proximal portion with a few small diagonals.   The ramus intermedius artery has 25% stenosis in its proximal portion.   The circumflex coronary artery is a small to moderate caliber vessel that  has a small obtuse marginal and is free of disease.   The right coronary artery is a moderate caliber to small caliber dominant  vessel with a moderate caliber posterior descending coronary artery.  There  are two lesions in the right coronary artery.  There is a proximal 75%  stenosis and a distal 75% stenosis just prior to the crux.   The left ventriculogram reveals normal left ventricular function estimated  at 65% with no mitral regurgitation and no wall motion abnormalities.  ASSESSMENT:  1. Single-vessel coronary artery disease in the appropriate distribution for     the perfusion abnormality that is seen on the perfusion study.  2. Normal renal arteries.  3. Normal left ventricular function.   PLAN:  Proceed with percutaneous coronary intervention of the right coronary  artery.                                               Vida Roller, M.D.    JH/MEDQ  D:  11/12/2002  T:  11/13/2002  Job:  [id number]

## 2010-11-08 ENCOUNTER — Ambulatory Visit (INDEPENDENT_AMBULATORY_CARE_PROVIDER_SITE_OTHER): Payer: Medicare Other | Admitting: *Deleted

## 2010-11-08 ENCOUNTER — Other Ambulatory Visit: Payer: Medicare Other | Admitting: *Deleted

## 2010-11-08 DIAGNOSIS — I4891 Unspecified atrial fibrillation: Secondary | ICD-10-CM

## 2010-11-08 LAB — POCT INR: INR: 2.8

## 2010-11-09 ENCOUNTER — Other Ambulatory Visit (INDEPENDENT_AMBULATORY_CARE_PROVIDER_SITE_OTHER): Payer: Medicare Other | Admitting: *Deleted

## 2010-11-09 DIAGNOSIS — I2510 Atherosclerotic heart disease of native coronary artery without angina pectoris: Secondary | ICD-10-CM

## 2010-11-09 DIAGNOSIS — E7800 Pure hypercholesterolemia, unspecified: Secondary | ICD-10-CM

## 2010-11-09 LAB — LIPID PANEL
HDL: 35 mg/dL — ABNORMAL LOW (ref >39.00)
Triglycerides: 103 mg/dL (ref 0.0–149.0)
VLDL: 20.6 mg/dL (ref 0.0–40.0)

## 2010-11-09 LAB — HEPATIC FUNCTION PANEL
ALT: 28 U/L (ref 0–53)
AST: 23 U/L (ref 0–37)
Albumin: 4.2 g/dL (ref 3.5–5.2)
Total Bilirubin: 0.8 mg/dL (ref 0.3–1.2)
Total Protein: 7.3 g/dL (ref 6.0–8.3)

## 2010-12-07 ENCOUNTER — Ambulatory Visit (INDEPENDENT_AMBULATORY_CARE_PROVIDER_SITE_OTHER): Payer: Medicare Other | Admitting: *Deleted

## 2010-12-07 DIAGNOSIS — I4891 Unspecified atrial fibrillation: Secondary | ICD-10-CM

## 2010-12-07 LAB — POCT INR: INR: 2.9

## 2010-12-15 ENCOUNTER — Other Ambulatory Visit: Payer: Self-pay | Admitting: Internal Medicine

## 2011-01-01 ENCOUNTER — Other Ambulatory Visit: Payer: Self-pay | Admitting: Internal Medicine

## 2011-01-01 MED ORDER — FLUTICASONE PROPIONATE 50 MCG/ACT NA SUSP: 2.0000 | NASAL | Status: DC

## 2011-01-01 NOTE — Telephone Encounter (Signed)
Pt req refill of fluticasone (FLONASE) 50 MCG/ACT nasal spray to [facility] order.

## 2011-01-04 ENCOUNTER — Ambulatory Visit (INDEPENDENT_AMBULATORY_CARE_PROVIDER_SITE_OTHER): Payer: Medicare Other | Admitting: *Deleted

## 2011-01-04 DIAGNOSIS — I4891 Unspecified atrial fibrillation: Secondary | ICD-10-CM

## 2011-01-04 LAB — POCT INR: INR: 2.7

## 2011-01-15 ENCOUNTER — Encounter: Payer: Self-pay | Admitting: Gastroenterology

## 2011-02-02 ENCOUNTER — Ambulatory Visit (INDEPENDENT_AMBULATORY_CARE_PROVIDER_SITE_OTHER): Payer: Medicare Other | Admitting: *Deleted

## 2011-02-02 DIAGNOSIS — I4891 Unspecified atrial fibrillation: Secondary | ICD-10-CM

## 2011-03-01 ENCOUNTER — Ambulatory Visit (INDEPENDENT_AMBULATORY_CARE_PROVIDER_SITE_OTHER): Payer: Medicare Other | Admitting: *Deleted

## 2011-03-01 DIAGNOSIS — I4891 Unspecified atrial fibrillation: Secondary | ICD-10-CM

## 2011-03-14 LAB — CBC
HCT: 32.8 — ABNORMAL LOW
Platelets: 225
RDW: 13.4

## 2011-03-15 LAB — BASIC METABOLIC PANEL
BUN: 10
BUN: 12
BUN: 15
BUN: 13
CO2: 31
Calcium: 8.8
Calcium: 9
Calcium: 9
Calcium: 9
Chloride: 106
Creatinine, Ser: 1.09
Creatinine, Ser: 1.17
Creatinine, Ser: 1.2
Creatinine, Ser: 1.23
GFR calc Af Amer: >60
GFR calc Af Amer: >60
GFR calc non Af Amer: 58 — ABNORMAL LOW
GFR calc non Af Amer: 60 — ABNORMAL LOW
GFR calc non Af Amer: >60
GFR calc non Af Amer: >60
GFR calc non Af Amer: >60
GFR calc non Af Amer: >60
Glucose, Bld: 106 — ABNORMAL HIGH
Glucose, Bld: 123 — ABNORMAL HIGH
Glucose, Bld: 92
Potassium: 3.7
Potassium: 3.8
Potassium: 3.8
Potassium: 4.1
Sodium: 141
Sodium: 142
Sodium: 145

## 2011-03-15 LAB — CK TOTAL AND CKMB (NOT AT ARMC)
CK, MB: 11 — ABNORMAL HIGH
Relative Index: 9.5 — ABNORMAL HIGH
Total CK: 116

## 2011-03-15 LAB — LIPID PANEL
Triglycerides: 130
VLDL: 26

## 2011-03-15 LAB — PROTIME-INR
INR: 0.9
INR: 1
INR: 1
INR: 1
INR: 1
INR: 1
INR: 1
INR: 1
INR: 1.2
INR: 1.9 — ABNORMAL HIGH
INR: 2.4 — ABNORMAL HIGH
Prothrombin Time: 12.7
Prothrombin Time: 13.2
Prothrombin Time: 13.3
Prothrombin Time: 13.4
Prothrombin Time: 13.5
Prothrombin Time: 13.5
Prothrombin Time: 15.8 — ABNORMAL HIGH
Prothrombin Time: 22.3 — ABNORMAL HIGH
Prothrombin Time: 23.8 — ABNORMAL HIGH

## 2011-03-15 LAB — CBC
HCT: 34.9 — ABNORMAL LOW
HCT: 38 — ABNORMAL LOW
HCT: 39
HCT: 45.1
Hemoglobin: 12 — ABNORMAL LOW
Hemoglobin: 12.9 — ABNORMAL LOW
Hemoglobin: 13.4
Hemoglobin: 13.5
Hemoglobin: 13.6
Hemoglobin: 15.5
MCHC: 34.5
MCV: 91.6
Platelets: 150
Platelets: 160
Platelets: 161
Platelets: 175
Platelets: 194
RBC: 4.12 — ABNORMAL LOW
RBC: 4.25
RBC: 4.27
RBC: 4.28
RBC: 4.85
RDW: 12.9
RDW: 13.1
RDW: 13.3
RDW: 13.3
RDW: 13.1
WBC: 10.1
WBC: 10.1
WBC: 10.7 — ABNORMAL HIGH
WBC: 11 — ABNORMAL HIGH
WBC: 7.1
WBC: 8.2
WBC: 8.8

## 2011-03-15 LAB — DIFFERENTIAL
Basophils Absolute: 0
Eosinophils Relative: 2
Lymphocytes Relative: 18
Lymphs Abs: 1.5
Neutro Abs: 6.3
Neutrophils Relative %: 71

## 2011-03-15 LAB — CARDIAC PANEL(CRET KIN+CKTOT+MB+TROPI)
CK, MB: 2
Total CK: 113

## 2011-03-15 LAB — HEPARIN LEVEL (UNFRACTIONATED)
Heparin Unfractionated: 0.17 — ABNORMAL LOW
Heparin Unfractionated: 0.29 — ABNORMAL LOW
Heparin Unfractionated: <0.1 — ABNORMAL LOW

## 2011-03-15 LAB — APTT: aPTT: 43 — ABNORMAL HIGH

## 2011-03-23 ENCOUNTER — Other Ambulatory Visit: Payer: Self-pay | Admitting: Internal Medicine

## 2011-03-26 ENCOUNTER — Telehealth: Payer: Self-pay | Admitting: Internal Medicine

## 2011-03-26 MED ORDER — OMEPRAZOLE 20 MG PO CPDR: 20.0000 mg | DELAYED_RELEASE_CAPSULE | Freq: Every day | ORAL | Status: DC

## 2011-03-26 NOTE — Telephone Encounter (Signed)
Refill prilosec mail order. Please fax to [phone]. Thanks.

## 2011-03-26 NOTE — Telephone Encounter (Signed)
rx sent in electronically 

## 2011-03-29 ENCOUNTER — Ambulatory Visit (INDEPENDENT_AMBULATORY_CARE_PROVIDER_SITE_OTHER): Payer: MEDICARE | Admitting: *Deleted

## 2011-03-29 DIAGNOSIS — I4891 Unspecified atrial fibrillation: Secondary | ICD-10-CM

## 2011-04-16 ENCOUNTER — Encounter: Payer: Self-pay | Admitting: Cardiology

## 2011-04-16 ENCOUNTER — Ambulatory Visit (INDEPENDENT_AMBULATORY_CARE_PROVIDER_SITE_OTHER): Payer: MEDICARE | Admitting: Cardiology

## 2011-04-16 DIAGNOSIS — I4891 Unspecified atrial fibrillation: Secondary | ICD-10-CM

## 2011-04-16 DIAGNOSIS — I2510 Atherosclerotic heart disease of native coronary artery without angina pectoris: Secondary | ICD-10-CM

## 2011-04-16 DIAGNOSIS — Z950 Presence of cardiac pacemaker: Secondary | ICD-10-CM

## 2011-04-16 LAB — PACEMAKER DEVICE OBSERVATION
ATRIAL PACING PM: 3.21
BATTERY VOLTAGE: 2.96 V
BRDY-0002RV: 60 {beats}/min
BRDY-0004RV: 130 {beats}/min
VENTRICULAR PACING PM: 47.23

## 2011-04-16 NOTE — Patient Instructions (Signed)
The current medical regimen is effective;  continue present plan and medications.  Follow up in 1 year with Dr Hochrein.  You will receive a letter in the mail 2 months before you are due.  Please call us when you receive this letter to schedule your follow up appointment.  

## 2011-04-16 NOTE — Progress Notes (Signed)
HPI The patient presents for yearly pacemaker follow up.  He is doing well.  He is not exercising as much as I would like.  He does not feel his heart racing or skipping.  He has no presyncope or syncope.  The patient denies any new symptoms such as chest discomfort, neck or arm discomfort. There has been no new shortness of breath, PND or orthopnea.   No Known Allergies  Current Outpatient Prescriptions  Medication Sig Dispense Refill  . aspirin 81 MG tablet Take 81 mg by mouth daily.        Marland Kitchen atorvastatin (LIPITOR) 20 MG tablet TAKE 1 TABLET ONCE DAILY  90 tablet  1  . fluticasone (FLONASE) 50 MCG/ACT nasal spray Place 2 sprays into the nose as directed.  48 g  3  . MATZIM LA 240 MG 24 hr tablet TAKE 1 TABLET DAILY  90 tablet  3  . meloxicam (MOBIC) 7.5 MG tablet Take 7.5 mg by mouth daily.        . metoprolol (TOPROL-XL) 50 MG 24 hr tablet TAKE ONE AND ONE-HALF (1 & 1/2) TABLETS DAILY  135 tablet  1  . nitroGLYCERIN (NITROSTAT) 0.4 MG SL tablet Place 0.4 mg under the tongue every 5 (five) minutes as needed.        Marland Kitchen omeprazole (PRILOSEC) 20 MG capsule Take 1 capsule (20 mg total) by mouth daily.  90 capsule  3  . Tamsulosin HCl (FLOMAX) 0.4 MG CAPS Take 0.4 mg by mouth daily.        Marland Kitchen warfarin (COUMADIN) 2.5 MG tablet Take by mouth as directed.          Past Medical History  Diagnosis Date  . Actinic keratosis   . ALLERGIC RHINITIS   . Atrial fibrillation   . CAD, NATIVE VESSEL   . Cellulitis and abscess of trunk   . CHEST PAIN, ACUTE   . HYPERLIPIDEMIA   . HYPERTENSION   . OTH MALIG NEOPLASM SKIN OTH&UNSPEC PARTS FACE   . OTH MALIG NEOPLASM SKIN OTH\T\UNSPEC PARTS FACE   . PACEMAKER, PERMANENT     Past Surgical History  Procedure Date  . Insert / replace / remove pacemaker     with lead rvision and microperforation  . Back surgery   . Cystectomy     from right side of face.    ROS:  As stated in the HPI and negative for all other systems.  PHYSICAL EXAM BP 126/78   Pulse 84  Ht 5\' 8"  (1.727 m)  Wt 206 lb (93.441 kg)  BMI 31.32 kg/m2 GENERAL:  Well appearing HEENT:  Pupils equal round and reactive, fundi not visualized, oral mucosa unremarkable NECK:  No jugular venous distention, waveform within normal limits, carotid upstroke brisk and symmetric, no bruits, no thyromegaly LYMPHATICS:  No cervical, inguinal adenopathy LUNGS:  Clear to auscultation bilaterally BACK:  No CVA tenderness CHEST:  Pacer scar HEART:  PMI not displaced or sustained,S1 and S2 within normal limits, no S3, no S4, no clicks, no rubs, no murmurs ABD:  Flat, positive bowel sounds normal in frequency in pitch, no bruits, no rebound, no guarding, no midline pulsatile mass, no hepatomegaly, no splenomegaly EXT:  2 plus pulses throughout, no edema, no cyanosis no clubbing SKIN:  No rashes no nodules NEURO:  Cranial nerves II through XII grossly intact, motor grossly intact throughout PSYCH:  Cognitively intact, oriented to person place and time   ASSESSMENT AND PLAN

## 2011-04-16 NOTE — Assessment & Plan Note (Addendum)
Per Dr. Riley Kill.  I asked him to do more walking.  We will continue with aggressive risk reduction and meds as listed.  Of note I sent a message to Dr. Riley Kill to discuss whether or not to continue the ASA.

## 2011-04-16 NOTE — Assessment & Plan Note (Signed)
The patient  tolerates this rhythm and rate control and anticoagulation. We will continue with the meds as listed.  He is in fib 100% of the time.

## 2011-04-16 NOTE — Assessment & Plan Note (Signed)
This was evaluated today and he has normal pacer function.  We will check him in the office again in one year.

## 2011-04-17 ENCOUNTER — Other Ambulatory Visit: Payer: Self-pay | Admitting: *Deleted

## 2011-04-17 ENCOUNTER — Telehealth: Payer: Self-pay | Admitting: Internal Medicine

## 2011-04-17 MED ORDER — OMEPRAZOLE 20 MG PO CPDR: 20.0000 mg | DELAYED_RELEASE_CAPSULE | Freq: Every day | ORAL | Status: DC

## 2011-04-17 NOTE — Telephone Encounter (Signed)
No ov since 4-11--pt given #30 [facility] adn needs ov- ov made

## 2011-04-17 NOTE — Telephone Encounter (Signed)
Pt  Requesting refill on omeprazole (PRILOSEC) 20 MG capsule  [facility]

## 2011-04-24 ENCOUNTER — Encounter: Payer: Self-pay | Admitting: Internal Medicine

## 2011-04-24 ENCOUNTER — Ambulatory Visit (INDEPENDENT_AMBULATORY_CARE_PROVIDER_SITE_OTHER): Payer: MEDICARE | Admitting: Internal Medicine

## 2011-04-24 ENCOUNTER — Other Ambulatory Visit: Payer: Self-pay | Admitting: *Deleted

## 2011-04-24 VITALS — BP 124/80 | HR 80 | Temp 98.2°F | Resp 16 | Ht 68.0 in | Wt 202.0 lb

## 2011-04-24 DIAGNOSIS — K219 Gastro-esophageal reflux disease without esophagitis: Secondary | ICD-10-CM

## 2011-04-24 DIAGNOSIS — I209 Angina pectoris, unspecified: Secondary | ICD-10-CM

## 2011-04-24 DIAGNOSIS — I208 Other forms of angina pectoris: Secondary | ICD-10-CM

## 2011-04-24 DIAGNOSIS — I2510 Atherosclerotic heart disease of native coronary artery without angina pectoris: Secondary | ICD-10-CM

## 2011-04-24 DIAGNOSIS — Z9912 Encounter for respirator [ventilator] dependence during power failure: Secondary | ICD-10-CM

## 2011-04-24 MED ORDER — OMEPRAZOLE 20 MG PO CPDR: 20.0000 mg | DELAYED_RELEASE_CAPSULE | Freq: Every day | ORAL | Status: DC

## 2011-04-24 NOTE — Patient Instructions (Addendum)
The patient is instructed to continue all medications as prescribed. Schedule followup with check out clerk upon leaving the clinic You will be called to schedule a stress test

## 2011-04-24 NOTE — Progress Notes (Signed)
Subjective:    Patient ID: Shawn [name], male    DOB: 05/12/1935, 76 y.o.   MRN: [id number]  HPI Stable blood pressure Blood pressure Walking and gets a full feeling in stomach that resolves with rest. The pressure is reproducible Has not had any stress testing in "a right good while"  The patient [place] this discomfort [place] had a tight full feeling in the lower abdomen and the feeling went away when he rested.  This feeling has been reproducible he does have a history of CAD and atrial fibrillation and should be evaluated by a rest stress Myoview   Review of Systems  Constitutional: Negative for fever and fatigue.  HENT: Negative for hearing loss, congestion, neck pain and postnasal drip.   Eyes: Negative for discharge, redness and visual disturbance.  Respiratory: Positive for chest tightness and shortness of breath. Negative for cough and wheezing.   Cardiovascular: Negative for leg swelling.  Gastrointestinal: Negative for abdominal pain, constipation and abdominal distention.  Genitourinary: Negative for urgency and frequency.  Musculoskeletal: Negative for joint swelling and arthralgias.  Skin: Negative for color change and rash.  Neurological: Negative for weakness and light-headedness.  Hematological: Negative for adenopathy.  Psychiatric/Behavioral: Negative for behavioral problems.   Past Medical History  Diagnosis Date  . Actinic keratosis   . ALLERGIC RHINITIS   . Atrial fibrillation   . CAD, NATIVE VESSEL   . Cellulitis and abscess of trunk   . CHEST PAIN, ACUTE   . HYPERLIPIDEMIA   . HYPERTENSION   . OTH MALIG NEOPLASM SKIN OTH&UNSPEC PARTS FACE   . OTH MALIG NEOPLASM SKIN OTH\T\UNSPEC PARTS FACE   . PACEMAKER, PERMANENT     History   Social History  . Marital Status: Married    Spouse Name: N/A    Number of Children: N/A  . Years of Education: N/A   Occupational History  . Not on file.   Social History  Main Topics  . Smoking status: Never Smoker   . Smokeless tobacco: Not on file  . Alcohol Use: No  . Drug Use: No  . Sexually Active: Not on file   Other Topics Concern  . Not on file   Social History Narrative  . No narrative on file    Past Surgical History  Procedure Date  . Insert / replace / remove pacemaker     with lead rvision and microperforation  . Back surgery   . Cystectomy     from right side of face.    Family History  Problem Relation Age of Onset  . Colon cancer Other     No Known Allergies  Current Outpatient Prescriptions on File Prior to Visit  Medication Sig Dispense Refill  . aspirin 81 MG tablet Take 81 mg by mouth daily.        Marland Kitchen atorvastatin (LIPITOR) 20 MG tablet TAKE 1 TABLET ONCE DAILY  90 tablet  1  . fluticasone (FLONASE) 50 MCG/ACT nasal spray Place 2 sprays into the nose as directed.  48 g  3  . MATZIM LA 240 MG 24 hr tablet TAKE 1 TABLET DAILY  90 tablet  3  . meloxicam (MOBIC) 7.5 MG tablet Take 7.5 mg by mouth daily.        . metoprolol (TOPROL-XL) 50 MG 24 hr tablet TAKE ONE AND ONE-HALF (1 & 1/2) TABLETS DAILY  135 tablet  1  . nitroGLYCERIN (NITROSTAT) 0.4 MG SL tablet Place  0.4 mg under the tongue every 5 (five) minutes as needed.        . Tamsulosin HCl (FLOMAX) 0.4 MG CAPS Take 0.4 mg by mouth daily.        Marland Kitchen warfarin (COUMADIN) 2.5 MG tablet Take by mouth as directed.          BP 124/80  Pulse 80  Temp 98.2 F (36.8 C)  Resp 16  Ht 5\' 8"  (1.727 m)  Wt 202 lb (91.627 kg)  BMI 30.71 kg/m2       Objective:   Physical Exam  Nursing note and vitals reviewed. Constitutional: He appears well-developed and well-nourished.  HENT:  Head: Normocephalic and atraumatic.  Eyes: Conjunctivae are normal. Pupils are equal, round, and reactive to light.  Neck: Normal range of motion. Neck supple.  Cardiovascular: Normal rate and regular rhythm.   Pulmonary/Chest: Effort normal and breath sounds normal.  Abdominal: Soft. Bowel  sounds are normal.          Assessment & Plan:  We will schedule a nonemergent rest stress Myoview to evaluate this atypical anginal presentation I believe this is significant since his history for hypertension hyperlipidemia and previous CAD puts him at high risk for development of CAD.  If the rest stress is negative will consider a gastric etiology I would recommend that he increase the Prilosec temporarily to one twice a day and see if that affects the pain

## 2011-05-03 ENCOUNTER — Encounter: Payer: Self-pay | Admitting: Speech Pathology

## 2011-05-03 ENCOUNTER — Encounter (HOSPITAL_COMMUNITY): Payer: MEDICARE | Admitting: Radiology

## 2011-05-04 ENCOUNTER — Other Ambulatory Visit: Payer: Self-pay

## 2011-05-04 ENCOUNTER — Telehealth: Payer: Self-pay

## 2011-05-04 ENCOUNTER — Emergency Department (HOSPITAL_COMMUNITY)
Admission: EM | Admit: 2011-05-04 | Discharge: 2011-05-04 | Disposition: A | Discharge status: Home / Self Care | Payer: MEDICARE | Attending: Emergency Medicine | Admitting: Emergency Medicine

## 2011-05-04 ENCOUNTER — Encounter (HOSPITAL_COMMUNITY): Payer: Self-pay | Admitting: Emergency Medicine

## 2011-05-04 DIAGNOSIS — Z7901 Long term (current) use of anticoagulants: Secondary | ICD-10-CM | POA: Insufficient documentation

## 2011-05-04 DIAGNOSIS — I208 Other forms of angina pectoris: Secondary | ICD-10-CM

## 2011-05-04 DIAGNOSIS — I4891 Unspecified atrial fibrillation: Secondary | ICD-10-CM | POA: Insufficient documentation

## 2011-05-04 DIAGNOSIS — I10 Essential (primary) hypertension: Secondary | ICD-10-CM | POA: Insufficient documentation

## 2011-05-04 DIAGNOSIS — Z950 Presence of cardiac pacemaker: Secondary | ICD-10-CM | POA: Insufficient documentation

## 2011-05-04 DIAGNOSIS — Z79899 Other long term (current) drug therapy: Secondary | ICD-10-CM | POA: Insufficient documentation

## 2011-05-04 DIAGNOSIS — I2510 Atherosclerotic heart disease of native coronary artery without angina pectoris: Secondary | ICD-10-CM | POA: Insufficient documentation

## 2011-05-04 DIAGNOSIS — E785 Hyperlipidemia, unspecified: Secondary | ICD-10-CM | POA: Insufficient documentation

## 2011-05-04 DIAGNOSIS — Z7982 Long term (current) use of aspirin: Secondary | ICD-10-CM | POA: Insufficient documentation

## 2011-05-04 DIAGNOSIS — R079 Chest pain, unspecified: Secondary | ICD-10-CM | POA: Insufficient documentation

## 2011-05-04 DIAGNOSIS — I209 Angina pectoris, unspecified: Secondary | ICD-10-CM | POA: Insufficient documentation

## 2011-05-04 DIAGNOSIS — Z85820 Personal history of malignant melanoma of skin: Secondary | ICD-10-CM | POA: Insufficient documentation

## 2011-05-04 LAB — POCT I-STAT, CHEM 8
Calcium, Ion: 1.17 mmol/L (ref 1.12–1.32)
Glucose, Bld: 107 mg/dL — ABNORMAL HIGH (ref 70–99)
HCT: 47 % (ref 39.0–52.0)
Hemoglobin: 16 g/dL (ref 13.0–17.0)
Potassium: 4.4 mEq/L (ref 3.5–5.1)

## 2011-05-04 LAB — PROTIME-INR
INR: 2.12 — ABNORMAL HIGH (ref 0.00–1.49)
Prothrombin Time: 24.1 seconds — ABNORMAL HIGH (ref 11.6–15.2)

## 2011-05-04 LAB — CBC
MCH: 32.3 pg (ref 26.0–34.0)
Platelets: 166 10*3/uL (ref 150–400)
RBC: 4.93 MIL/uL (ref 4.22–5.81)

## 2011-05-04 LAB — DIFFERENTIAL
Basophils Relative: 1 % (ref 0–1)
Eosinophils Absolute: 0.3 10*3/uL (ref 0.0–0.7)
Lymphs Abs: 2.6 10*3/uL (ref 0.7–4.0)
Neutro Abs: 6.1 10*3/uL (ref 1.7–7.7)
Neutrophils Relative %: 60 % (ref 43–77)

## 2011-05-04 LAB — APTT: aPTT: 38 seconds — ABNORMAL HIGH (ref 24–37)

## 2011-05-04 NOTE — ED Notes (Signed)
Pt discharged home. Had no further questions regarding follow up. Vital signs stable. Ambulatory to discharge.

## 2011-05-04 NOTE — ED Notes (Signed)
Vital signs stable. 

## 2011-05-04 NOTE — ED Notes (Signed)
Pt c/o intermittent CP in epigastric area with exertion x several weeks; pt sts hx of CAD and stents; pt denies SOB or nausea; pt sts may be worse after eating

## 2011-05-04 NOTE — ED Notes (Signed)
Family at bedside. 

## 2011-05-04 NOTE — Telephone Encounter (Signed)
Called pt and he states he has SOB when walking; it hurts at his rib cage. Pt is having chest discomfort and leg swelling with some fluid.  Per Dr. Clent Ridges with pt's history of heart problems pt instructed to go to ER.  Pt is aware.

## 2011-05-04 NOTE — ED Notes (Signed)
MD at bedside. 

## 2011-05-04 NOTE — ED Provider Notes (Signed)
History     CSN: [id number] Arrival date & time: 05/04/2011  6:56 PM   First MD Initiated Contact with Patient 05/04/11 2051      Chief Complaint  Patient presents with  . Chest Pain    (Consider location/radiation/quality/duration/timing/severity/associated sxs/prior treatment) Patient is a 76 y.o. male presenting with chest pain. The history is provided by the patient and the spouse.  Chest Pain Pertinent negatives for primary symptoms include no fever, no shortness of breath, no cough, no nausea and no vomiting.  Pertinent negatives for associated symptoms include no weakness.    the patient is a 76 year old male, with known coronary artery disease.  He has 2 stents.  He complains of chest pain, when he goes for a walk.  The chest.  Pain lasts up to about 5 minutes after he rests.  It does not occur every time.  He walks, but it has been occurring frequently for the past 3 weeks.  He saw Dr. Lovell Sheehan about this.  Dr. Lovell Sheehan was concerned that it could be his heart and ask when he had his last treadmill.  He has not had a treadmill and a significant amount of time to Dr. Lovell Sheehan sent up a treadmill test for this coming Monday at 7:45 in the morning.  The patient denies any shortness of breath, nausea, vomiting, or diaphoresis.  When he has his chest.  Pain.  He is asymptomatic now.  He took aspirin today.  A few weeks ago.  He had to take 2 nitroglycerin for his pain, but he rarely takes nitroglycerin.    Past Medical History  Diagnosis Date  . Actinic keratosis   . ALLERGIC RHINITIS   . Atrial fibrillation   . CAD, NATIVE VESSEL   . Cellulitis and abscess of trunk   . CHEST PAIN, ACUTE   . HYPERLIPIDEMIA   . HYPERTENSION   . OTH MALIG NEOPLASM SKIN OTH&UNSPEC PARTS FACE   . OTH MALIG NEOPLASM SKIN OTH\T\UNSPEC PARTS FACE   . PACEMAKER, PERMANENT     Past Surgical History  Procedure Date  . Insert / replace / remove pacemaker     with lead rvision and microperforation  .  Back surgery   . Cystectomy     from right side of face.    Family History  Problem Relation Age of Onset  . Colon cancer Other     History  Substance Use Topics  . Smoking status: Never Smoker   . Smokeless tobacco: Not on file  . Alcohol Use: No      Review of Systems  Constitutional: Negative for fever and chills.  HENT: Negative for congestion.   Eyes: Negative for redness.  Respiratory: Negative for cough and shortness of breath.   Cardiovascular: Positive for chest pain.  Gastrointestinal: Negative for nausea and vomiting.  Musculoskeletal: Negative for back pain.  Skin: Negative for rash.  Neurological: Negative for weakness.  Psychiatric/Behavioral: Negative for confusion.    Allergies  Review of patient's allergies indicates no known allergies.  Home Medications   Current Outpatient Rx  Name Route Sig Dispense Refill  . ASPIRIN 81 MG PO TABS Oral Take 81 mg by mouth daily.      . ATORVASTATIN CALCIUM 20 MG PO TABS Oral Take 20 mg by mouth at bedtime.      Marland Kitchen DILTIAZEM HCL ER COATED BEADS 240 MG PO TB24 Oral Take 240 mg by mouth daily.      Marland Kitchen FLUTICASONE PROPIONATE 50 MCG/ACT NA SUSP  Nasal Place 2 sprays into the nose daily.      . MELOXICAM 7.5 MG PO TABS Oral Take 7.5 mg by mouth daily.     Marland Kitchen METOPROLOL SUCCINATE ER 50 MG PO TB24 Oral Take 75 mg by mouth daily.      Marland Kitchen NITROGLYCERIN 0.4 MG SL SUBL Sublingual Place 0.4 mg under the tongue every 5 (five) minutes as needed.      Marland Kitchen OMEPRAZOLE 20 MG PO CPDR Oral Take 20 mg by mouth daily.      Marland Kitchen TAMSULOSIN HCL 0.4 MG PO CAPS Oral Take 0.4 mg by mouth daily.      . WARFARIN SODIUM 2.5 MG PO TABS Oral Take 2.5-5 mg by mouth daily. Take 5 mg on Monday, Wednesday, and Friday. Take 2.5 mg Tuesday, Thursday, and the weekend      BP 144/78  Pulse 66  Temp(Src) 98.2 F (36.8 C) (Oral)  Resp 16  SpO2 100%  Physical Exam  Constitutional: He is oriented to person, place, and time. He appears well-developed and  well-nourished. No distress.  HENT:  Head: Normocephalic and atraumatic.  Eyes: EOM are normal. Pupils are equal, round, and reactive to light.  Neck: Normal range of motion. Neck supple.  Cardiovascular: Normal rate, regular rhythm and normal heart sounds.   No murmur heard. Pulmonary/Chest: Effort normal and breath sounds normal. No respiratory distress. He has no wheezes. He has no rales.  Abdominal: Soft. Bowel sounds are normal. He exhibits no distension and no mass. There is no tenderness. There is no rebound and no guarding.  Musculoskeletal: Normal range of motion. He exhibits no edema and no tenderness.  Neurological: He is alert and oriented to person, place, and time. No cranial nerve deficit.  Skin: Skin is warm and dry. He is not diaphoretic.  Psychiatric: He has a normal mood and affect. His behavior is normal.    ED Course  Procedures (including critical care time)  Stable angina.  No evidence of damage to his heart.    Labs Reviewed  DIFFERENTIAL - Abnormal; Notable for the following:    Monocytes Absolute 1.1 (*)    All other components within normal limits  PROTIME-INR - Abnormal; Notable for the following:    Prothrombin Time 24.1 (*)    INR 2.12 (*)    All other components within normal limits  APTT - Abnormal; Notable for the following:    aPTT 38 (*)    All other components within normal limits  POCT I-STAT, CHEM 8 - Abnormal; Notable for the following:    Glucose, Bld 107 (*)    All other components within normal limits  CBC  POCT I-STAT TROPONIN I  I-STAT, CHEM 8  I-STAT TROPONIN I   No results found.   stable angina I spoke with the cardiologist on call.  He agrees with discharging the patient to home.  The patient has nitroglycerin at home.  He takes aspirin every day.  He already has an appointment at 0 745 for the patient to have a treadmill test.  He understands that he should come back to the hospital immediately if he has refractory  pain.   MDM   stable angina          Nicholes Stairs, MD 05/04/11 2311

## 2011-05-05 ENCOUNTER — Ambulatory Visit: Payer: MEDICARE | Admitting: Family Medicine

## 2011-05-07 ENCOUNTER — Ambulatory Visit (HOSPITAL_COMMUNITY): Payer: MEDICARE | Attending: Internal Medicine | Admitting: Radiology

## 2011-05-07 DIAGNOSIS — R079 Chest pain, unspecified: Secondary | ICD-10-CM | POA: Insufficient documentation

## 2011-05-07 DIAGNOSIS — I2510 Atherosclerotic heart disease of native coronary artery without angina pectoris: Secondary | ICD-10-CM | POA: Insufficient documentation

## 2011-05-07 DIAGNOSIS — I10 Essential (primary) hypertension: Secondary | ICD-10-CM | POA: Insufficient documentation

## 2011-05-07 DIAGNOSIS — R61 Generalized hyperhidrosis: Secondary | ICD-10-CM | POA: Insufficient documentation

## 2011-05-07 DIAGNOSIS — E669 Obesity, unspecified: Secondary | ICD-10-CM | POA: Insufficient documentation

## 2011-05-07 DIAGNOSIS — R0609 Other forms of dyspnea: Secondary | ICD-10-CM | POA: Insufficient documentation

## 2011-05-07 DIAGNOSIS — R5381 Other malaise: Secondary | ICD-10-CM | POA: Insufficient documentation

## 2011-05-07 DIAGNOSIS — R0989 Other specified symptoms and signs involving the circulatory and respiratory systems: Secondary | ICD-10-CM | POA: Insufficient documentation

## 2011-05-07 DIAGNOSIS — R5383 Other fatigue: Secondary | ICD-10-CM | POA: Insufficient documentation

## 2011-05-07 DIAGNOSIS — I4891 Unspecified atrial fibrillation: Secondary | ICD-10-CM

## 2011-05-07 DIAGNOSIS — Z8249 Family history of ischemic heart disease and other diseases of the circulatory system: Secondary | ICD-10-CM | POA: Insufficient documentation

## 2011-05-07 DIAGNOSIS — R000 Tachycardia, unspecified: Secondary | ICD-10-CM | POA: Insufficient documentation

## 2011-05-07 DIAGNOSIS — K219 Gastro-esophageal reflux disease without esophagitis: Secondary | ICD-10-CM

## 2011-05-07 DIAGNOSIS — I208 Other forms of angina pectoris: Secondary | ICD-10-CM

## 2011-05-07 DIAGNOSIS — E785 Hyperlipidemia, unspecified: Secondary | ICD-10-CM | POA: Insufficient documentation

## 2011-05-07 MED ORDER — TECHNETIUM TC 99M TETROFOSMIN IV KIT
33.0000 | PACK | Freq: Once | INTRAVENOUS | Status: AC | PRN
  Administered 2011-05-07: 33 via INTRAVENOUS

## 2011-05-07 MED ORDER — TECHNETIUM TC 99M TETROFOSMIN IV KIT
11.0000 | PACK | Freq: Once | INTRAVENOUS | Status: AC | PRN
  Administered 2011-05-07: 11 via INTRAVENOUS

## 2011-05-07 MED ORDER — ADENOSINE (DIAGNOSTIC) 3 MG/ML IV SOLN
0.5600 mg/kg | Freq: Once | INTRAVENOUS | Status: AC
  Administered 2011-05-07: 51.9 mg via INTRAVENOUS

## 2011-05-07 NOTE — Progress Notes (Signed)
[facility] SITE 3 NUCLEAR MED [address] [place] [address] [phone]  Cardiology Nuclear Med Study  Shawn [name] is a 76 y.o. male [id number] [birth year]   Nuclear Med Background Indication for Stress Test:  Evaluation for Ischemia and PTCA/Stent Patency  History:  9/08 OZH:[id number] ischemia, EF=63%>Cath=PTCA/Stent-RCA>PTVP; '09 Echo:EF=60%; h/o PAF with RVR Cardiac Risk Factors: Family History - CAD, Hypertension, Lipids and Obesity  Symptoms:  Chest Pressure with and without Exertion (last episode of chest discomfort: none since discharge), Diaphoresis, DOE, Fatigue and Rapid HR   Nuclear Pre-Procedure Caffeine/Decaff Intake:  None NPO After: 10:00pm   Lungs:  Clear.  O2 SAT 96% on RA. IV 0.9% NS with Angio Cath:  22g  IV Site: L Forearm  IV Started by:  Bonnita Levan, RN  Chest Size (in):  44 Cup Size: N/A  Height: 5\' 8"  (1.727 m)  Weight:  204 lb (92.534 kg)  BMI:  Body mass index is 31.02 kg/(m^2). Tech Comments:  Patient held all meds, including Toprol, today    Nuclear Med Study 1 or 2 day study: 1 day  Stress Test Type:  Adenosine  Reading MD: Olga Millers, MD  Order Authorizing Provider:  Darryll Capers, MD  Resting Radionuclide: Technetium 32m Tetrofosmin  Resting Radionuclide Dose: 11.0 mCi   Stress Radionuclide:  Technetium 75m Tetrofosmin  Stress Radionuclide Dose: 33.0 mCi           Stress Protocol Rest HR: 84 Stress HR: 125  Rest BP: 139/79 Stress BP: 137/77  Exercise Time (min): n/a METS: n/a   Predicted Max HR: 146 bpm % Max HR: 85.62 bpm Rate Pressure Product: 96295   Dose of Adenosine (mg):  51.9 Dose of Lexiscan: n/a mg  Dose of Atropine (mg): n/a Dose of Dobutamine: n/a mcg/kg/min (at max HR)  Stress Test Technologist: Smiley Houseman, CMA-N  Nuclear Technologist:  Domenic Polite, CNMT     Rest Procedure:  Myocardial perfusion imaging was performed at rest 45 minutes following the intravenous administration of  Technetium 46m Tetrofosmin.  Rest ECG: Atrial Fibrilliation with occasional PVC's.  Stress Procedure:  The patient received IV adenosine at 140 mcg/kg/min for 4 minutes.  There were no significant changes with infusion, rare PVC.  Technetium 61m Tetrofosmin was injected at the 2 minute mark and quantitative spect images were obtained after a 45 minute delay.  Stress ECG: No significant ST segment change suggestive of ischemia; patient in atrial fibrillation during the study.  QPS Raw Data Images:  Acquisition technically good; normal left ventricular size. Stress Images:  There is decreased uptake in the inferior wall. Rest Images:  There is decreased uptake in the inferior wall, more prominent compared to the stress images. Subtraction (SDS):  These findings are consistent with small prior inferobasal infarct and moderate to severe inferior ischemia. Transient Ischemic Dilatation (Normal <1.22):  1.06 Lung/Heart Ratio (Normal <0.45):  0.42  Quantitative Gated Spect Images QGS EDV:  59 ml QGS ESV:  17 ml QGS cine images:  NL LV Function; NL Wall Motion QGS EF: 71%  Impression Exercise Capacity:  Adenosine study with no exercise. BP Response:  Normal blood pressure response. Clinical Symptoms:  No chest pain. ECG Impression:  No significant ST segment change suggestive of ischemia. Comparison with Prior Nuclear Study: No images to compare  Overall Impression:  Abnormal stress nuclear study with a large, partially reversible inferior defect consistent with small prior inferobasal infarct and moderate to severe inferior ischemia. Olga Millers

## 2011-05-09 ENCOUNTER — Ambulatory Visit (INDEPENDENT_AMBULATORY_CARE_PROVIDER_SITE_OTHER): Payer: MEDICARE | Admitting: Cardiology

## 2011-05-09 ENCOUNTER — Encounter: Payer: Self-pay | Admitting: Cardiology

## 2011-05-09 DIAGNOSIS — I10 Essential (primary) hypertension: Secondary | ICD-10-CM

## 2011-05-09 DIAGNOSIS — I4891 Unspecified atrial fibrillation: Secondary | ICD-10-CM

## 2011-05-09 DIAGNOSIS — I2510 Atherosclerotic heart disease of native coronary artery without angina pectoris: Secondary | ICD-10-CM

## 2011-05-09 LAB — CBC WITH DIFFERENTIAL/PLATELET
Basophils Absolute: 0 10*3/uL (ref 0.0–0.1)
Lymphocytes Relative: 23 % (ref 12.0–46.0)
Monocytes Relative: 7.5 % (ref 3.0–12.0)
Neutrophils Relative %: 66.3 % (ref 43.0–77.0)
Platelets: 169 10*3/uL (ref 150.0–400.0)
RDW: 13.4 % (ref 11.5–14.6)

## 2011-05-09 LAB — PROTIME-INR: Prothrombin Time: 24.2 s — ABNORMAL HIGH (ref 10.2–12.4)

## 2011-05-09 LAB — BASIC METABOLIC PANEL
BUN: 15 mg/dL (ref 6–23)
Calcium: 9.1 mg/dL (ref 8.4–10.5)
GFR: 68.06 mL/min (ref >60.00)
Glucose, Bld: 101 mg/dL — ABNORMAL HIGH (ref 70–99)

## 2011-05-09 MED ORDER — NITROGLYCERIN 0.4 MG SL SUBL: 0.4000 mg | SUBLINGUAL_TABLET | SUBLINGUAL | Status: DC | PRN

## 2011-05-09 NOTE — Assessment & Plan Note (Signed)
Well controlled 

## 2011-05-09 NOTE — Assessment & Plan Note (Signed)
Controlled on warfarin, and rate control.  Backup pacing.

## 2011-05-09 NOTE — Patient Instructions (Signed)
Your physician recommends that you have lab work today: BMP, CBC, PT/INR  Your physician has requested that you have a cardiac catheterization. Cardiac catheterization is used to diagnose and/or treat various heart conditions. Doctors may recommend this procedure for a number of different reasons. The most common reason is to evaluate chest pain. Chest pain can be a symptom of coronary artery disease (CAD), and cardiac catheterization can show whether plaque is narrowing or blocking your heart's arteries. This procedure is also used to evaluate the valves, as well as measure the blood flow and oxygen levels in different parts of your heart. For further information please visit [email or web address]. Please follow instruction sheet, as given.  Your physician recommends that you continue on your current medications as directed. Please refer to the Current Medication list given to you today.   

## 2011-05-09 NOTE — Progress Notes (Signed)
HPI:  Patient is here for follow up.  For the past month he has had exertional chest tightness.  This has not occurred at rest.  He was seen in the ER at [facility] over the weekend and underwent nuclear imaging.  This demonstrates inferior ischemia, in the area of prior stenting.  He has known LAD disease of moderate degree, and has two prior DES prox and distal in 2004 that were patent in 2008.  He had non DES in 2008 as he had new atrial fib requiring warfarin.  He also got a pacer.  He is in afib, and that was stopped yesterday.  I reviewed this in detail, and recommended cath.  He has not had rest pain, and I told him to come to the ER.  We plan cath as soon as possible when INR is less than 1.6    Current Outpatient Prescriptions  Medication Sig Dispense Refill  . aspirin 81 MG tablet Take 81 mg by mouth daily.        Marland Kitchen atorvastatin (LIPITOR) 20 MG tablet Take 20 mg by mouth at bedtime.        Marland Kitchen diltiazem (MATZIM LA) 240 MG 24 hr tablet Take 240 mg by mouth daily.        . fluticasone (FLONASE) 50 MCG/ACT nasal spray Place 2 sprays into the nose daily.        . meloxicam (MOBIC) 7.5 MG tablet Take 7.5 mg by mouth daily.       . metoprolol (TOPROL-XL) 50 MG 24 hr tablet Take 75 mg by mouth daily.        . nitroGLYCERIN (NITROSTAT) 0.4 MG SL tablet Place 0.4 mg under the tongue every 5 (five) minutes as needed.        Marland Kitchen omeprazole (PRILOSEC) 20 MG capsule Take 20 mg by mouth daily.        . Tamsulosin HCl (FLOMAX) 0.4 MG CAPS Take 0.4 mg by mouth daily.        Marland Kitchen warfarin (COUMADIN) 2.5 MG tablet Take 2.5-5 mg by mouth daily. Take 5 mg on Monday, Wednesday, and Friday. Take 2.5 mg Tuesday, Thursday, and the weekend        No Known Allergies  Past Medical History  Diagnosis Date  . Actinic keratosis   . ALLERGIC RHINITIS   . Atrial fibrillation   . CAD, NATIVE VESSEL   . Cellulitis and abscess of trunk   . CHEST PAIN, ACUTE   . HYPERLIPIDEMIA   . HYPERTENSION   . OTH MALIG NEOPLASM SKIN  OTH&UNSPEC PARTS FACE   . OTH MALIG NEOPLASM SKIN OTH\T\UNSPEC PARTS FACE   . PACEMAKER, PERMANENT     Past Surgical History  Procedure Date  . Insert / replace / remove pacemaker     with lead rvision and microperforation  . Back surgery   . Cystectomy     from right side of face.    Family History  Problem Relation Age of Onset  . Colon cancer Other     History   Social History  . Marital Status: Married    Spouse Name: N/A    Number of Children: N/A  . Years of Education: N/A   Occupational History  . Not on file.   Social History Main Topics  . Smoking status: Never Smoker   . Smokeless tobacco: Not on file  . Alcohol Use: No  . Drug Use: No  . Sexually Active: Not on file   Other  Topics Concern  . Not on file   Social History Narrative  . No narrative on file    ROS: Please see the HPI.  All other systems reviewed and negative.  PHYSICAL EXAM:  BP 128/64  Pulse 70  Ht 5\' 8"  (1.727 m)  Wt 92.08 kg (203 lb)  BMI 30.87 kg/m2  General: Well developed, well nourished, in no acute distress. Head:  Normocephalic and atraumatic. Neck: no JVD Lungs: Clear to auscultation and percussion. Heart: Normal S1 and S2.  No murmur, rubs or gallops.  Abdomen:  Normal bowel sounds; soft; non tender; no organomegaly Pulses: Pulses normal in all 4 extremities. Extremities: No clubbing or cyanosis. No edema. Neurologic: Alert and oriented x 3.  EKG:  Atrial fib with controlled ventricular response.  Low voltage QRS.  Cath:  Mar 11, 2007   ANGIOGRAPHIC DATA:   1. The right coronary artery has been previously stented, both at the       ostium and distally. As noted in the diagnostic study, there is a       critical stenosis proximal to the acute margin.  Following       stenting, this is reduced from 99 to 0% with smooth edges and no       evidence of edge tear.  There was zero residual stenosis.  The       distal vessel runoff was excellent.  In the proximal  vessel, there       was about a 50-60% area of narrowing, but it appeared to be an       adequate lumen, and we did not feel this should be stented.   2. The left anterior descending artery demonstrates a previously       demonstrated 70% stenosis.  In the LAO cranial view, this appears       to be somewhat less than this.  In the RAO cranial view, its       appears to be about 70%.  Importantly, there was a side branch that       comes out at the lesion site which represents of small to moderate       diagonal.  There is some ostial narrowing here.      CONCLUSION:   1. Successful percutaneous stenting of the right coronary artery.   2. Angiography of the left anterior descending artery.      DISPOSITION:  The patient will be treated with aspirin and Plavix.  He   will need Coumadin.  Permanent pacing is planned for tomorrow.  We will   reassess the LAD at some point in the near future, either by myocardial   perfusion imaging or possibly catheterization after he is stabilized.   This has been discussed with the family in detail.   Myocardial Perfusion Imaging  (05/07/2011)  Quantitative Gated Spect Images  QGS EDV: 59 ml  QGS ESV: 17 ml  QGS cine images: NL LV Function; NL Wall Motion  QGS EF: 71%  Impression  Exercise Capacity: Adenosine study with no exercise.  BP Response: Normal blood pressure response.  Clinical Symptoms: No chest pain.  ECG Impression: No significant ST segment change suggestive of ischemia.  Comparison with Prior Nuclear Study: No images to compare  Overall Impression: Abnormal stress nuclear study with a large, partially reversible inferior defect consistent with small prior inferobasal infarct and moderate to severe inferior ischemia.  Olga Millers         ASSESSMENT AND PLAN:

## 2011-05-09 NOTE — Assessment & Plan Note (Signed)
He has ischemia by nuclear imaging, and symptoms consistent.  He will need recath when INR is down.   I told him to come to the ER if any rest pain, and he is aware.  We will get labs today, and cath either Friday or Monday.  He will need fresh NTG, and I have instructed him not to drive.  His wife will.  Careful instructions given.

## 2011-05-09 NOTE — Progress Notes (Signed)
Patient ID: Shawn [name], male   DOB: [birth year], 76 y.o.   MRN: [id number]

## 2011-05-10 ENCOUNTER — Ambulatory Visit (INDEPENDENT_AMBULATORY_CARE_PROVIDER_SITE_OTHER): Payer: MEDICARE | Admitting: *Deleted

## 2011-05-10 ENCOUNTER — Encounter: Payer: MEDICARE | Admitting: *Deleted

## 2011-05-10 DIAGNOSIS — I4891 Unspecified atrial fibrillation: Secondary | ICD-10-CM

## 2011-05-10 LAB — POCT INR: INR: 1.8

## 2011-05-11 ENCOUNTER — Encounter (HOSPITAL_COMMUNITY): Payer: Self-pay

## 2011-05-11 NOTE — H&P (Signed)
HPI: Patient is here for follow up. For the past month he has had exertional chest tightness. This has not occurred at rest. He was seen in the ER at [facility] over the weekend and underwent nuclear imaging. This demonstrates inferior ischemia, in the area of prior stenting. He has known LAD disease of moderate degree, and has two prior DES prox and distal in 2004 that were patent in 2008. He had non DES in 2008 as he had new atrial fib requiring warfarin. He also got a pacer. He is in afib, and that was stopped yesterday. I reviewed this in detail, and recommended cath. He has not had rest pain, and I told him to come to the ER. We plan cath as soon as possible when INR is less than 1.6  Current Outpatient Prescriptions   Medication  Sig  Dispense  Refill   .  aspirin 81 MG tablet  Take 81 mg by mouth daily.     Marland Kitchen  atorvastatin (LIPITOR) 20 MG tablet  Take 20 mg by mouth at bedtime.     Marland Kitchen  diltiazem (MATZIM LA) 240 MG 24 hr tablet  Take 240 mg by mouth daily.     .  fluticasone (FLONASE) 50 MCG/ACT nasal spray  Place 2 sprays into the nose daily.     .  meloxicam (MOBIC) 7.5 MG tablet  Take 7.5 mg by mouth daily.     .  metoprolol (TOPROL-XL) 50 MG 24 hr tablet  Take 75 mg by mouth daily.     .  nitroGLYCERIN (NITROSTAT) 0.4 MG SL tablet  Place 0.4 mg under the tongue every 5 (five) minutes as needed.     Marland Kitchen  omeprazole (PRILOSEC) 20 MG capsule  Take 20 mg by mouth daily.     .  Tamsulosin HCl (FLOMAX) 0.4 MG CAPS  Take 0.4 mg by mouth daily.     Marland Kitchen  warfarin (COUMADIN) 2.5 MG tablet  Take 2.5-5 mg by mouth daily. Take 5 mg on Monday, Wednesday, and Friday. Take 2.5 mg Tuesday, Thursday, and the weekend      No Known Allergies  Past Medical History   Diagnosis  Date   .  Actinic keratosis    .  ALLERGIC RHINITIS    .  Atrial fibrillation    .  CAD, NATIVE VESSEL    .  Cellulitis and abscess of trunk    .  CHEST PAIN, ACUTE    .  HYPERLIPIDEMIA    .  HYPERTENSION    .  OTH MALIG NEOPLASM SKIN  OTH&UNSPEC PARTS FACE    .  OTH MALIG NEOPLASM SKIN OTH\T\UNSPEC PARTS FACE    .  PACEMAKER, PERMANENT     Past Surgical History   Procedure  Date   .  Insert / replace / remove pacemaker      with lead rvision and microperforation   .  Back surgery    .  Cystectomy      from right side of face.    Family History   Problem  Relation  Age of Onset   .  Colon cancer  Other     History    Social History   .  Marital Status:  Married     Spouse Name:  N/A     Number of Children:  N/A   .  Years of Education:  N/A    Occupational History   .  Not on file.    Social  History Main Topics   .  Smoking status:  Never Smoker   .  Smokeless tobacco:  Not on file   .  Alcohol Use:  No   .  Drug Use:  No   .  Sexually Active:  Not on file    Other Topics  Concern   .  Not on file    Social History Narrative   .  No narrative on file    ROS:  Please see the HPI. All other systems reviewed and negative.  PHYSICAL EXAM:  BP 128/64  Pulse 70  Ht 5\' 8"  (1.727 m)  Wt 92.08 kg (203 lb)  BMI 30.87 kg/m2  General: Well developed, well nourished, in no acute distress.  Head: Normocephalic and atraumatic.  Neck: no JVD  Lungs: Clear to auscultation and percussion.  Heart: Normal S1 and S2. No murmur, rubs or gallops.  Abdomen: Normal bowel sounds; soft; non tender; no organomegaly  Pulses: Pulses normal in all 4 extremities.  Extremities: No clubbing or cyanosis. No edema.  Neurologic: Alert and oriented x 3.  EKG: Atrial fib with controlled ventricular response. Low voltage QRS.  Cath: Mar 11, 2007  ANGIOGRAPHIC DATA:  1. The right coronary artery has been previously stented, both at the  ostium and distally. As noted in the diagnostic study, there is a  critical stenosis proximal to the acute margin. Following  stenting, this is reduced from 99 to 0% with smooth edges and no  evidence of edge tear. There was zero residual stenosis. The  distal vessel runoff was excellent. In  the proximal vessel, there  was about a 50-60% area of narrowing, but it appeared to be an  adequate lumen, and we did not feel this should be stented.  2. The left anterior descending artery demonstrates a previously  demonstrated 70% stenosis. In the LAO cranial view, this appears  to be somewhat less than this. In the RAO cranial view, its  appears to be about 70%. Importantly, there was a side branch that  comes out at the lesion site which represents of small to moderate  diagonal. There is some ostial narrowing here.  CONCLUSION:  1. Successful percutaneous stenting of the right coronary artery.  2. Angiography of the left anterior descending artery.  DISPOSITION: The patient will be treated with aspirin and Plavix. He  will need Coumadin. Permanent pacing is planned for tomorrow. We will  reassess the LAD at some point in the near future, either by myocardial  perfusion imaging or possibly catheterization after he is stabilized.  This has been discussed with the family in detail.  Myocardial Perfusion Imaging (05/07/2011)  Quantitative Gated Spect Images  QGS EDV: 59 ml  QGS ESV: 17 ml  QGS cine images: NL LV Function; NL Wall Motion  QGS EF: 71%  Impression  Exercise Capacity: Adenosine study with no exercise.  BP Response: Normal blood pressure response.  Clinical Symptoms: No chest pain.  ECG Impression: No significant ST segment change suggestive of ischemia.  Comparison with Prior Nuclear Study: No images to compare  Overall Impression: Abnormal stress nuclear study with a large, partially reversible inferior defect consistent with small prior inferobasal infarct and moderate to severe inferior ischemia.  Olga Millers    See Encounter Problems.  \   I have seen and recommended cardiac cath.  Risks and alternatives have been explained to the patient in detail.  He is agreeable to proceed.  We cannot approach the r radial because  of prior surgeries.  Will approach from  the R femoral.  All questions answered.  [name] 05/14/2011 3:45 PM

## 2011-05-14 ENCOUNTER — Encounter (HOSPITAL_COMMUNITY)
Admission: RE | Disposition: A | Discharge status: Home / Self Care | Payer: Self-pay | Source: Ambulatory Visit | Attending: Cardiology

## 2011-05-14 ENCOUNTER — Ambulatory Visit (HOSPITAL_COMMUNITY)
Admission: RE | Admit: 2011-05-14 | Discharge: 2011-05-15 | Disposition: A | Discharge status: Home / Self Care | Payer: MEDICARE | Source: Ambulatory Visit | Attending: Cardiology | Admitting: Cardiology

## 2011-05-14 ENCOUNTER — Encounter (HOSPITAL_COMMUNITY): Payer: Self-pay | Admitting: Cardiology

## 2011-05-14 DIAGNOSIS — I209 Angina pectoris, unspecified: Secondary | ICD-10-CM | POA: Insufficient documentation

## 2011-05-14 DIAGNOSIS — I4891 Unspecified atrial fibrillation: Secondary | ICD-10-CM | POA: Insufficient documentation

## 2011-05-14 DIAGNOSIS — I2510 Atherosclerotic heart disease of native coronary artery without angina pectoris: Secondary | ICD-10-CM | POA: Insufficient documentation

## 2011-05-14 DIAGNOSIS — I10 Essential (primary) hypertension: Secondary | ICD-10-CM | POA: Insufficient documentation

## 2011-05-14 HISTORY — PX: CORONARY ANGIOPLASTY WITH STENT PLACEMENT: SHX49

## 2011-05-14 HISTORY — DX: Gastro-esophageal reflux disease without esophagitis: K21.9

## 2011-05-14 HISTORY — PX: LEFT HEART CATHETERIZATION WITH CORONARY ANGIOGRAM: SHX5451

## 2011-05-14 LAB — POCT ACTIVATED CLOTTING TIME: Activated Clotting Time: 452 seconds

## 2011-05-14 LAB — PROTIME-INR: Prothrombin Time: 13.3 seconds (ref 11.6–15.2)

## 2011-05-14 SURGERY — LEFT HEART CATHETERIZATION WITH CORONARY ANGIOGRAM
Anesthesia: LOCAL

## 2011-05-14 MED ORDER — METOPROLOL SUCCINATE ER 50 MG PO TB24
50.0000 mg | ORAL_TABLET | Freq: Every day | ORAL | Status: DC
  Filled 2011-05-14: qty 1

## 2011-05-14 MED ORDER — CLOPIDOGREL BISULFATE 300 MG PO TABS
ORAL_TABLET | ORAL | Status: AC
  Administered 2011-05-15: 75 mg via ORAL
  Filled 2011-05-14: qty 2

## 2011-05-14 MED ORDER — FENTANYL CITRATE 0.05 MG/ML IJ SOLN
INTRAMUSCULAR | Status: AC
  Filled 2011-05-14: qty 2

## 2011-05-14 MED ORDER — ASPIRIN 81 MG PO CHEW
81.0000 mg | CHEWABLE_TABLET | Freq: Every day | ORAL | Status: DC
  Filled 2011-05-14: qty 1

## 2011-05-14 MED ORDER — DILTIAZEM HCL ER COATED BEADS 120 MG PO TB24
240.0000 mg | ORAL_TABLET | Freq: Every day | ORAL | Status: DC
  Filled 2011-05-14: qty 2

## 2011-05-14 MED ORDER — NITROGLYCERIN 0.2 MG/ML ON CALL CATH LAB
INTRAVENOUS | Status: AC
  Filled 2011-05-14: qty 1

## 2011-05-14 MED ORDER — ASPIRIN 81 MG PO CHEW
324.0000 mg | CHEWABLE_TABLET | ORAL | Status: AC
  Administered 2011-05-14: 324 mg via ORAL
  Filled 2011-05-14: qty 4

## 2011-05-14 MED ORDER — CLOPIDOGREL BISULFATE 75 MG PO TABS
75.0000 mg | ORAL_TABLET | Freq: Every day | ORAL | Status: DC
  Administered 2011-05-15: 75 mg via ORAL
  Filled 2011-05-14: qty 1

## 2011-05-14 MED ORDER — SODIUM CHLORIDE 0.9 % IV SOLN
INTRAVENOUS | Status: DC
  Administered 2011-05-14: 11:00:00 via INTRAVENOUS

## 2011-05-14 MED ORDER — ACETAMINOPHEN 325 MG PO TABS: 650.0000 mg | ORAL_TABLET | ORAL | Status: DC | PRN

## 2011-05-14 MED ORDER — ONDANSETRON HCL 4 MG/2ML IJ SOLN: 4.0000 mg | Freq: Four times a day (QID) | INTRAMUSCULAR | Status: DC | PRN

## 2011-05-14 MED ORDER — HEPARIN (PORCINE) IN NACL 2-0.9 UNIT/ML-% IJ SOLN
INTRAMUSCULAR | Status: AC
  Filled 2011-05-14: qty 2000

## 2011-05-14 MED ORDER — PANTOPRAZOLE SODIUM 40 MG PO TBEC
40.0000 mg | DELAYED_RELEASE_TABLET | Freq: Every day | ORAL | Status: DC
  Administered 2011-05-15: 40 mg via ORAL
  Filled 2011-05-14: qty 1

## 2011-05-14 MED ORDER — SODIUM CHLORIDE 0.9 % IV SOLN
INTRAVENOUS | Status: AC
  Administered 2011-05-14: 23:00:00 via INTRAVENOUS

## 2011-05-14 MED ORDER — LIDOCAINE HCL (PF) 1 % IJ SOLN
INTRAMUSCULAR | Status: AC
  Filled 2011-05-14: qty 30

## 2011-05-14 MED ORDER — BIVALIRUDIN 250 MG IV SOLR
INTRAVENOUS | Status: AC
  Filled 2011-05-14: qty 250

## 2011-05-14 MED ORDER — MIDAZOLAM HCL 2 MG/2ML IJ SOLN
INTRAMUSCULAR | Status: AC
  Filled 2011-05-14: qty 2

## 2011-05-14 MED ORDER — DIAZEPAM 5 MG PO TABS
5.0000 mg | ORAL_TABLET | ORAL | Status: AC
  Administered 2011-05-14: 5 mg via ORAL
  Filled 2011-05-14: qty 1

## 2011-05-14 NOTE — Op Note (Signed)
Cardiac Catheterization Procedure Note  Name: ARLYNN MCDERMID MRN: [id number] DOB: 1935/04/16  Procedure: Left Heart Cath, Selective Coronary Angiography, LV angiography,  PTCA/Stent of RCA  Indication: The patient had recent onset of recurrent angina. He has 3 prior stents in the right coronary artery. Exercise testing revealed a significant perfusion defect in the inferior myocardial segment. He is stopped his warfarin, and his INR is now adequate for cardiac catheterization. Risks, benefits, and alternatives were explained to the patient and he consented to proceed. All questions were answered.    Diagnostic Procedure Details: The right groin was prepped, draped, and anesthetized with 1% lidocaine. Using the modified Seldinger technique, a 5 French sheath was introduced into the right femoral artery. Standard Judkins catheters were used for selective coronary angiography and left ventriculography. Catheter exchanges were performed over a wire.  The diagnostic procedure was well-tolerated without immediate complications.  PROCEDURAL FINDINGS Hemodynamics: AO 135/72  (97) LV 142/16  Coronary angiography: Coronary dominance: right  Left mainstem: The left main coronary artery is segmentally plaqued at the ostium of about 30% narrowing similar to the prior study.  Left anterior descending (LAD): The left anterior descending artery has a calcified diagonal branch which comes off proximally. There is about 50% segmental narrowing throughout the proximal area. The LAD itself has about 60-70% narrowing just past the takeoff of the diagonal. This was seen on prior studies. The remainder of the LAD is without critical narrowing.  The large diagonal branch, bifurcates distally and has some diffuse luminal irregularities in both branches with a 50-60% area of narrowing in its inferior subbranch.  Left circumflex (LCx): The AV circumflex courses posteriorly and supplies a fairly small posterolateral  segment. There is collateralization of the distal right coronary circulation from this vessel.  Right coronary artery (RCA): The right coronary artery is been previously stented. There is a stent proximally which is widely patent. Just after the bend, there is a 90% E. centric stenosis with reduced flow distally. There is a previously placed stent in the mid vessel and one placed also in the distal vessel. These remain widely patent there is competitive flow noted in the PDA and posterolateral system as the vessels are collateralized distally.  Left ventriculography: Left ventricular systolic function is normal, LVEF is estimated at 55-65%, there is no significant mitral regurgitation   PCI Procedure Note:  Following the diagnostic procedure, the decision was made to proceed with PCI. The sheath was upsized to a 6 [place]. Weight-based bivalirudin was given for anticoagulation. Once a therapeutic ACT was achieved, a 6 [place] JR4 [facility] guide catheter was inserted.  A traverse coronary guidewire was used to cross the lesion.  The lesion was predilated with a 2.25 by 12 BSX Apex balloon.  The artery was then stent  with a BSX 2.22mm by 24 mm Veriflex non DESstent.  The stent was postdilated with a 3.0  noncompliant balloon.  Following PCI, there was 0% residual stenosis and TIMI-3 flow. Final angiography confirmed an excellent result. Femoral sheath was sewn into placed.  The patient tolerated the PCI procedure well. There were no immediate procedural complications.  The patient was transferred to the post catheterization recovery area for further monitoring.  PCI Data: Vessel - RCA/Segment - prox/mid Percent Stenosis (pre)  99 TIMI-flow 2 Stent 2.75 mm by 24mm Veriflex non DES Percent Stenosis (post) 0 TIMI-flow (post) 3  The RCA has been previously stented.  The 3 stents, including one non DES, are widely patent.  There  is a new lesion between the two proximal stents.  The new stent was placed between the  two, with an excellent angiographic result.  Non DES was placed in due to need for chronic warfarin therapy for atrial fibrillation.  His ischemia was inferior by nuclear imaging, so hopefully his symptoms will be controlled.  Continued medical therapy is recommended.  Final Conclusions:    1.  Successful PCI of the RCA for a new lesion with non DES secondary to need for warfarin 2.  Preserved LV function. 3.  Moderate disease of the LCA as noted in the text  Recommendations: ASA/clopidogrel for 2 weeks, resume warfarin in two weeks with clopidogrel alone.    [name] 05/14/2011, 6:03 PM

## 2011-05-14 NOTE — Progress Notes (Signed)
Patient ID: Shawn [name], male   DOB: 1934-11-16, 76 y.o.   MRN: [id number] Stable post PCI.  Sheath coming out.  Plan dc in the am.    [name]' 8:58 PM 05/14/2011

## 2011-05-15 ENCOUNTER — Other Ambulatory Visit: Payer: Self-pay

## 2011-05-15 DIAGNOSIS — I2510 Atherosclerotic heart disease of native coronary artery without angina pectoris: Secondary | ICD-10-CM

## 2011-05-15 LAB — CBC
Platelets: 133 10*3/uL — ABNORMAL LOW (ref 150–400)
RBC: 4.75 MIL/uL (ref 4.22–5.81)
WBC: 8.4 10*3/uL (ref 4.0–10.5)

## 2011-05-15 LAB — BASIC METABOLIC PANEL
CO2: 22 mEq/L (ref 19–32)
Chloride: 107 mEq/L (ref 96–112)
Sodium: 139 mEq/L (ref 135–145)

## 2011-05-15 MED ORDER — MELOXICAM 7.5 MG PO TABS: 7.5000 mg | ORAL_TABLET | Freq: Every day | ORAL | Status: DC

## 2011-05-15 MED ORDER — METOPROLOL SUCCINATE ER 50 MG PO TB24: 50.0000 mg | ORAL_TABLET | Freq: Every day | ORAL | Status: DC

## 2011-05-15 MED ORDER — PANTOPRAZOLE SODIUM 40 MG PO TBEC: 40.0000 mg | DELAYED_RELEASE_TABLET | Freq: Every day | ORAL | Status: DC

## 2011-05-15 MED ORDER — CLOPIDOGREL BISULFATE 75 MG PO TABS: 75.0000 mg | ORAL_TABLET | Freq: Every day | ORAL | Status: DC

## 2011-05-15 MED FILL — Dextrose Inj 5%: INTRAVENOUS | Qty: 50 | Status: AC

## 2011-05-15 NOTE — Progress Notes (Signed)
Site area: right groin  Site Prior to Removal:  Level 0  Pressure Applied For 25 MINUTES    Minutes Beginning at 2050  Manual:   yes  Patient Status During Pull:  stable  Post Pull Groin Site:  Level 1  Post Pull Instructions Given:  yes  Post Pull Pulses Present:  yes  Dressing Applied:  yes  Comments:  Pt tolerated procedure well

## 2011-05-15 NOTE — Progress Notes (Signed)
CARDIAC REHAB PHASE I   PRE:  Rate/Rhythm: 76 Afib POD  BP:  Supine: 134/83  Sitting:   Standing:   SaO2:   MODE:  Ambulation: 680 ft   POST:  Rate/Rhythem: 121 pacing  BP:  Supine:   Sitting: 136/73  Standing:    SaO2:  0755-0850  Tolerated ambulation well without c/o of cp or SOB. Completed discharge education with pt. He voices understanding. He agrees to [employer]. CRP in [place], will send referral.  Shawn [name]

## 2011-05-15 NOTE — Progress Notes (Signed)
Patient Name: Shawn [name] Date of Encounter: 05/15/2011, 6:58 AM     Subjective  Feels great. No pain at cath site. No CP/SOB.   Objective   Telemetry: atrial fib rates ~70's intermittent V pacing  Physical Exam: Filed Vitals:   05/15/11 0516  BP: 114/69  Pulse: 73  Temp: 98.2 F (36.8 C)  Resp:    General: Well developed, well nourished, in no acute distress. Head: Normocephalic, atraumatic, sclera non-icteric, no xanthomas, nares are without discharge.  Neck: Negative for carotid bruits. JVD not elevated. Lungs: Clear bilaterally to auscultation without wheezes, rales, or rhonchi. Breathing is unlabored. Heart: Irregular with intermittent regularity, S1 S2 without murmurs, rubs, or gallops.  Abdomen: Soft, non-tender, non-distended with normoactive bowel sounds. No hepatomegaly. No rebound/guarding. No obvious abdominal masses. Msk:  Strength and tone appear normal for age. Extremities: No clubbing or cyanosis. No edema.  Distal pedal pulses are 2+ and equal bilaterally. Right groin without any bleeding, suppuration, hematoma or bruit Neuro: Alert and oriented X 3. Moves all extremities spontaneously. Psych:  Responds to questions appropriately with a normal affect.   Intake/Output Summary (Last 24 hours) at 05/15/11 0658 Last data filed at 05/15/11 0455  Gross per 24 hour  Intake   1150 ml  Output    975 ml  Net    175 ml    Labs:  Basename 05/15/11 0430  NA 139  K 3.8  CL 107  CO2 22  GLUCOSE 108*  BUN 12  CREATININE 1.02  CALCIUM 9.0  MG --  PHOS --    Basename 05/15/11 0430  WBC 8.4  NEUTROABS --  HGB 14.9  HCT 42.3  MCV 89.1  PLT 133*   No results found for this basename: CKTOTAL:4,CKMB:4,TROPONINI:4 in the last 72 hours  Radiology/Studies:  1. Cath, see below.  PMH also significant for HTN, HL, actinic keratosis, allergic rhinitis, pericardial effusion in 2008.   Assessment and Plan  1. Chest pain, likely unstable angina 2. CAD  with abnormal nuc 05/07/11 - s/p successful PCI of the RCA for a new lesion with non-DES secondary to need for warfarin - s/p DES prox & distal RCA in 2004 - s/p BMS to RCA 2008 - normal LV function with EF 55-65% by cath 05/14/11 Continue ASA, BB, Plavix. Consider addition of statin. Plan is for ASA/Plavix x 2 weeks then resume Coumadin at that time and d/c ASA. Likely able to be D/C'd today.  2. Atrial fibrillation with tachybrady syndrome s/p Medtronic EnRhythm PPM 2008 Coumadin as above. Continue BB/diltiazem.  3. HTN - controlled   Signed, Ronie Spies PA-C  Patient seen and examined.  Doing well at present.  No chest pain.  Feels good.  Groin looks good.   Our plan is to restart warfarin at two weeks, and we will use warfarin and clopidogrel after that.  He will see me next week.  He should remain on atorvastatin.  Greater than 30 minutes combined dc time.    Shawn [name] 05/15/2011 8:41 AM

## 2011-05-15 NOTE — Discharge Summary (Signed)
Discharge Summary   Patient ID: Shawn [name] MRN: [id number], DOB/AGE: June 18, 1934 76 y.o. Admit date: 05/14/2011 D/C date:     05/15/2011   Primary Discharge Diagnoses:  1. Chest pain, likely unstable angina  2. CAD with abnormal nuc 05/07/11  - s/p successful PCI of the RCA for a new lesion with non-DES secondary to need for warfarin  - s/p DES prox & distal RCA in 2004  - s/p BMS to RCA 2008  - normal LV function with EF 55-65% by cath 05/14/11  3. Atrial fibrillation  - s/p tachybrady syndrome s/p Medtronic EnRhythm PPM 2008  - Coumadin on hold until seen by Dr. Riley Kill (appointment pending by office schedulers)  Secondary Discharge Diagnoses:  1. HTN - controlled 2. HL 3. Actinic keratosis 4. Allergic rhinitis 5. Pericardial effusion in 2008 following pacemaker implant  Hospital Course: Shawn [name] is a 76 y/o M with a hx of CAD, HTN, afib who presented to [facility] with complaints of chest pain. He underwent nuclear testing which demonstrated inferior ischemia in the area of prior stenting. His coumadin was held in anticipation for cath, which he underwent yesterday and ultimately had successful PCI of the RCA for a new lesion with non-DES secondary to need for warfarin. Dr. Riley Kill would like to continue him on ASA/Plavix x 2 weeks with tentative plans to resume Coumadin in 2 weeks, but he will decide this when he sees the patient in follow-up. Shawn [name] tolerated the procedure well. Dr. Riley Kill has seen & examined him today and feels he is stable for discharge.   Discharge Vitals: Blood pressure 134/83, pulse 77, temperature 97.7 F (36.5 C), temperature source Oral, resp. rate 20, height 5\' 8"  (1.727 m), weight 197 lb 12 oz (89.7 kg), SpO2 96.00%.  Labs: Lab Results  Component Value Date   WBC 8.4 05/15/2011   HGB 14.9 05/15/2011   HCT 42.3 05/15/2011   MCV 89.1 05/15/2011   PLT 133* 05/15/2011    Lab 05/15/11 0430  NA 139  K 3.8  CL 107  CO2 22  BUN 12    CREATININE 1.02  CALCIUM 9.0  PROT --  BILITOT --  ALKPHOS --  ALT --  AST --  GLUCOSE 108*    Lab Results  Component Value Date   CHOL 122 11/09/2010   HDL 35.00* 11/09/2010   LDLCALC 66 11/09/2010   TRIG 103.0 11/09/2010   Diagnostic Studies/Procedures:  1. Cardiac catheterization this admission, please see full report and above for summary.  Discharge Medications   Current Discharge Medication List    START taking these medications   Details  clopidogrel (PLAVIX) 75 MG tablet Take 1 tablet (75 mg total) by mouth daily with breakfast. Qty: 30 tablet, Refills: 1    pantoprazole (PROTONIX) 40 MG tablet Take 1 tablet (40 mg total) by mouth daily. Some studies suggest that omeprazole (Prilosec) interacts with Plavix. For less chance of interaction, take this medicine instead of omeprazole. Qty: 30 tablet, Refills: 2      CONTINUE these medications which have CHANGED   Details  meloxicam (MOBIC) 7.5 MG tablet Take 1 tablet (7.5 mg total) by mouth daily.  *the patient was instructed to talk to prescribing provider before restarting this medicine due to increased risk of stomach bleeding given antiplt/anticoag meds  metoprolol (TOPROL-XL) 50 MG 24 hr tablet Take 1 tablet (50 mg total) by mouth daily. Qty: 30 tablet, Refills: 6      CONTINUE these medications which have  NOT CHANGED   Details  aspirin 81 MG tablet Take 81 mg by mouth daily.      atorvastatin (LIPITOR) 20 MG tablet Take 20 mg by mouth at bedtime.      diltiazem (MATZIM LA) 240 MG 24 hr tablet Take 240 mg by mouth daily.      fluticasone (FLONASE) 50 MCG/ACT nasal spray Place 2 sprays into the nose daily.      nitroGLYCERIN (NITROSTAT) 0.4 MG SL tablet Place 1 tablet (0.4 mg total) under the tongue every 5 (five) minutes as needed. Qty: 25 tablet, Refills: 2    Tamsulosin HCl (FLOMAX) 0.4 MG CAPS Take 0.4 mg by mouth daily.        STOP taking these medications     omeprazole (PRILOSEC) 20 MG capsule       warfarin (COUMADIN) 2.5 MG tablet         Disposition   The patient will be discharged in stable condition to home. Discharge Orders    Future Appointments: Provider: Department: Dept Phone: Center:   06/26/2011 9:45 AM Carrie Mew [facility] [phone] [facility]   07/19/2011 9:05 AM Amber Caryl Bis, RN [facility] [phone] [facility]     Future Orders Please Complete By Expires   Diet - low sodium heart healthy      Increase activity slowly      Comments:   No driving for 2 days. No lifting over 5 lbs for 1 week. No sexual activity for 1 week.     Discharge wound care:      Comments:   Keep procedure site clean & dry. If you notice increased pain, swelling, bleeding or pus, call/return!  You may shower, but no soaking baths/hot tubs/pools for 1 week.     Discharge instructions      Comments:   You will stop taking Coumadin for now. Dr. Riley Kill will tell you when to resume it when you see him in follow-up.     Follow-up Information    Follow up with [name], MD. (Our office will call you)    Contact information:   [address] [place] [address] [phone]            Duration of Discharge Encounter: Greater than 30 minutes including physician and PA time.  Signed, Ronie Spies PA-C 05/15/2011, 9:55 AM

## 2011-05-16 ENCOUNTER — Encounter (HOSPITAL_COMMUNITY): Payer: MEDICARE | Admitting: Radiology

## 2011-05-23 ENCOUNTER — Ambulatory Visit (INDEPENDENT_AMBULATORY_CARE_PROVIDER_SITE_OTHER): Payer: MEDICARE | Admitting: Cardiology

## 2011-05-23 ENCOUNTER — Encounter: Payer: Self-pay | Admitting: Cardiology

## 2011-05-23 VITALS — BP 128/70 | HR 79 | Ht 68.0 in | Wt 201.0 lb

## 2011-05-23 DIAGNOSIS — Z950 Presence of cardiac pacemaker: Secondary | ICD-10-CM

## 2011-05-23 DIAGNOSIS — I2510 Atherosclerotic heart disease of native coronary artery without angina pectoris: Secondary | ICD-10-CM

## 2011-05-23 DIAGNOSIS — E785 Hyperlipidemia, unspecified: Secondary | ICD-10-CM

## 2011-05-23 DIAGNOSIS — I4891 Unspecified atrial fibrillation: Secondary | ICD-10-CM

## 2011-05-23 NOTE — Patient Instructions (Signed)
Your physician recommends that you schedule a follow-up appointment in: 2 WEEKS  Your physician recommends that you continue on your current medications as directed. Please refer to the Current Medication list given to you today.   

## 2011-05-30 NOTE — Progress Notes (Signed)
HPI:  Shawn [name] is in for follow up.  He underwent repeat angio which demonstrated a new high grade lesion in an unstented portion of the RCA, and he underwent implantation of a new stent.  This was uncomplicated.  A non DES was placed, as on similar occurences, due to his need for chronic warfarin anticoagulation.  He is markedly improved, and his angina, which was noticeable and prominent, is completely resolved at this point.  Denies other symptoms.    Current Outpatient Prescriptions  Medication Sig Dispense Refill  . aspirin 81 MG tablet Take 81 mg by mouth daily.        Marland Kitchen atorvastatin (LIPITOR) 20 MG tablet Take 20 mg by mouth at bedtime.        . clopidogrel (PLAVIX) 75 MG tablet Take 1 tablet (75 mg total) by mouth daily with breakfast.  30 tablet  1  . diltiazem (MATZIM LA) 240 MG 24 hr tablet Take 240 mg by mouth daily.        . fluticasone (FLONASE) 50 MCG/ACT nasal spray Place 2 sprays into the nose daily.        . meloxicam (MOBIC) 7.5 MG tablet Take 1 tablet (7.5 mg total) by mouth daily.      . metoprolol (TOPROL-XL) 50 MG 24 hr tablet Take 1 tablet (50 mg total) by mouth daily.  30 tablet  6  . nitroGLYCERIN (NITROSTAT) 0.4 MG SL tablet Place 1 tablet (0.4 mg total) under the tongue every 5 (five) minutes as needed.  25 tablet  2  . pantoprazole (PROTONIX) 40 MG tablet Take 1 tablet (40 mg total) by mouth daily. Some studies suggest that omeprazole (Prilosec) interacts with Plavix. For less chance of interaction, take this medicine instead of omeprazole.  30 tablet  2  . Tamsulosin HCl (FLOMAX) 0.4 MG CAPS Take 0.4 mg by mouth daily.          No Known Allergies  Past Medical History  Diagnosis Date  . Actinic keratosis   . ALLERGIC RHINITIS   . Atrial fibrillation   . CAD, NATIVE VESSEL   . Cellulitis and abscess of trunk   . CHEST PAIN, ACUTE   . HYPERLIPIDEMIA   . HYPERTENSION   . OTH MALIG NEOPLASM SKIN OTH&UNSPEC PARTS FACE     "?from sunburns"  . OTH MALIG  NEOPLASM SKIN OTH\T\UNSPEC PARTS FACE   . PACEMAKER, PERMANENT 2008  . Myocardial infarction   . Angina   . GERD (gastroesophageal reflux disease)     Past Surgical History  Procedure Date  . Cystectomy     from right side of face.  . Cataract extraction w/ intraocular lens  implant, bilateral 2012  . Back surgery 2000    "for bulging discs"  . Insert / replace / remove pacemaker 2008    with lead rvision and microperforation  . Coronary angioplasty with stent placement 2008    "3"  . Coronary angioplasty with stent placement 05/14/11    "1"    Family History  Problem Relation Age of Onset  . Colon cancer Other     History   Social History  . Marital Status: Married    Spouse Name: N/A    Number of Children: N/A  . Years of Education: N/A   Occupational History  . Not on file.   Social History Main Topics  . Smoking status: Never Smoker   . Smokeless tobacco: Never Used  . Alcohol Use: Yes  05/14/11 "last occasional drink was in 1990's"  . Drug Use: No  . Sexually Active: Yes   Other Topics Concern  . Not on file   Social History Narrative  . No narrative on file    ROS: Please see the HPI.  All other systems reviewed and negative.  PHYSICAL EXAM:  BP 128/70  Pulse 79  Ht 5\' 8"  (1.727 m)  Wt 91.173 kg (201 lb)  BMI 30.56 kg/m2  General: Well developed, well nourished, in no acute distress. Head:  Normocephalic and atraumatic. Neck: no JVD Lungs: Clear to auscultation and percussion. Heart: Normal S1 and S2.  No murmur, rubs or gallops.  Abdomen:  Normal bowel sounds; soft; non tender; no organomegaly Pulses: Pulses normal in all 4 extremities. Extremities: No clubbing or cyanosis. No edema. Neurologic: Alert and oriented x 3.  EKG:  Atrial fib.  Controlled ventricular response.  Nonspecific ST and T wave changes.  CATH  Cardiac Catheterization Procedure Note  Name: Shawn [name]  MRN: [id number]  DOB: [birth year]  Procedure: Left  Heart Cath, Selective Coronary Angiography, LV angiography, PTCA/Stent of RCA  Indication: The patient had recent onset of recurrent angina. He has 3 prior stents in the right coronary artery. Exercise testing revealed a significant perfusion defect in the inferior myocardial segment. He is stopped his warfarin, and his INR is now adequate for cardiac catheterization. Risks, benefits, and alternatives were explained to the patient and he consented to proceed. All questions were answered.  Diagnostic Procedure Details: The right groin was prepped, draped, and anesthetized with 1% lidocaine. Using the modified Seldinger technique, a 5 French sheath was introduced into the right femoral artery. Standard Judkins catheters were used for selective coronary angiography and left ventriculography. Catheter exchanges were performed over a wire. The diagnostic procedure was well-tolerated without immediate complications.  PROCEDURAL FINDINGS  Hemodynamics:  AO 135/72 (97)  LV 142/16  Coronary angiography:  Coronary dominance: right  Left mainstem: The left main coronary artery is segmentally plaqued at the ostium of about 30% narrowing similar to the prior study.  Left anterior descending (LAD): The left anterior descending artery has a calcified diagonal branch which comes off proximally. There is about 50% segmental narrowing throughout the proximal area. The LAD itself has about 60-70% narrowing just past the takeoff of the diagonal. This was seen on prior studies. The remainder of the LAD is without critical narrowing. The large diagonal branch, bifurcates distally and has some diffuse luminal irregularities in both branches with a 50-60% area of narrowing in its inferior subbranch.  Left circumflex (LCx): The AV circumflex courses posteriorly and supplies a fairly small posterolateral segment. There is collateralization of the distal right coronary circulation from this vessel.  Right coronary artery (RCA):  The right coronary artery is been previously stented. There is a stent proximally which is widely patent. Just after the bend, there is a 90% E. centric stenosis with reduced flow distally. There is a previously placed stent in the mid vessel and one placed also in the distal vessel. These remain widely patent there is competitive flow noted in the PDA and posterolateral system as the vessels are collateralized distally.  Left ventriculography: Left ventricular systolic function is normal, LVEF is estimated at 55-65%, there is no significant mitral regurgitation  PCI Procedure Note: Following the diagnostic procedure, the decision was made to proceed with PCI. The sheath was upsized to a 6 [place]. Weight-based bivalirudin was given for anticoagulation. Once a therapeutic ACT was achieved,  a 6 [place] JR4 [facility] guide catheter was inserted. A traverse coronary guidewire was used to cross the lesion. The lesion was predilated with a 2.25 by 12 BSX Apex balloon. The artery was then stent with a BSX 2.16mm by 24 mm Veriflex non DESstent. The stent was postdilated with a 3.0 noncompliant balloon. Following PCI, there was 0% residual stenosis and TIMI-3 flow. Final angiography confirmed an excellent result. Femoral sheath was sewn into placed. The patient tolerated the PCI procedure well. There were no immediate procedural complications. The patient was transferred to the post catheterization recovery area for further monitoring.  PCI Data:  Vessel - RCA/Segment - prox/mid  Percent Stenosis (pre) 99  TIMI-flow 2  Stent 2.75 mm by 24mm Veriflex non DES  Percent Stenosis (post) 0  TIMI-flow (post) 3  The RCA has been previously stented. The 3 stents, including one non DES, are widely patent. There is a new lesion between the two proximal stents. The new stent was placed between the two, with an excellent angiographic result. Non DES was placed in due to need for chronic warfarin therapy for atrial fibrillation. His  ischemia was inferior by nuclear imaging, so hopefully his symptoms will be controlled. Continued medical therapy is recommended.  Final Conclusions:  1. Successful PCI of the RCA for a new lesion with non DES secondary to need for warfarin  2. Preserved LV function.  3. Moderate disease of the LCA as noted in the text  Recommendations: ASA/clopidogrel for 2 weeks, resume warfarin in two weeks with clopidogrel alone.  [name]  05/14/2011, 6:03 PM      ASSESSMENT AND PLAN:

## 2011-06-03 NOTE — Assessment & Plan Note (Signed)
Well-controlled on current regimen. ?

## 2011-06-03 NOTE — Assessment & Plan Note (Signed)
His symptoms are entirely resolved at this point.  He is doing well.  We will proceed to transition his DAPT over to combination of ?plavix and warfarin at the time of transition.  TS

## 2011-06-03 NOTE — Assessment & Plan Note (Signed)
Ventricular rate is controlled at present.

## 2011-06-03 NOTE — Assessment & Plan Note (Signed)
stable °

## 2011-06-07 ENCOUNTER — Other Ambulatory Visit: Payer: Self-pay | Admitting: Internal Medicine

## 2011-06-12 ENCOUNTER — Encounter: Payer: Self-pay | Admitting: Cardiology

## 2011-06-12 ENCOUNTER — Ambulatory Visit (INDEPENDENT_AMBULATORY_CARE_PROVIDER_SITE_OTHER): Payer: MEDICARE | Admitting: Cardiology

## 2011-06-12 DIAGNOSIS — I10 Essential (primary) hypertension: Secondary | ICD-10-CM

## 2011-06-12 DIAGNOSIS — I4891 Unspecified atrial fibrillation: Secondary | ICD-10-CM

## 2011-06-12 DIAGNOSIS — D696 Thrombocytopenia, unspecified: Secondary | ICD-10-CM

## 2011-06-12 DIAGNOSIS — E785 Hyperlipidemia, unspecified: Secondary | ICD-10-CM

## 2011-06-12 DIAGNOSIS — I2510 Atherosclerotic heart disease of native coronary artery without angina pectoris: Secondary | ICD-10-CM

## 2011-06-12 NOTE — Patient Instructions (Addendum)
Your physician has recommended you make the following change in your medication: STOP Plavix, Restart Warfarin at 5mg  Monday, Wednesday and Friday and 2.5mg  all other days, STOP Protonix---you can resume your Prilosec from the PCP  Please have an INR checked in the office in 1 WEEK in the Coumadin Clinic  Your physician recommends that you schedule a follow-up appointment in: 6 WEEKS

## 2011-06-17 DIAGNOSIS — Z862 Personal history of diseases of the blood and blood-forming organs and certain disorders involving the immune mechanism: Secondary | ICD-10-CM | POA: Insufficient documentation

## 2011-06-17 NOTE — Assessment & Plan Note (Signed)
Currently controlled.  No changes.

## 2011-06-17 NOTE — Assessment & Plan Note (Signed)
Last LDL was at target.   

## 2011-06-17 NOTE — Assessment & Plan Note (Signed)
Very mild, and noted after procedure.  Off of plavix, currently, and will recheck at six weeks follow up.

## 2011-06-17 NOTE — Assessment & Plan Note (Signed)
He is now out from his procedure 4 weeks with non DES.  Needs to restart warfarin. Will stop plavix at this point, and continue low dose ASA in combo with warfarin.  He has done well on this for many years.  Continue.  We did consider plavix as opposed to ASA, but since he has been so successful with this will continue.

## 2011-06-17 NOTE — Progress Notes (Signed)
HPI:  He is doing extremely well.  Denies any chest pain.  The last procedure was associated with resolution.  He is still off of warfarin.   Current Outpatient Prescriptions  Medication Sig Dispense Refill  . aspirin 81 MG tablet Take 81 mg by mouth daily.        Marland Kitchen atorvastatin (LIPITOR) 20 MG tablet Take 20 mg by mouth at bedtime.        Marland Kitchen diltiazem (MATZIM LA) 240 MG 24 hr tablet Take 240 mg by mouth daily.        . fluticasone (FLONASE) 50 MCG/ACT nasal spray Place 2 sprays into the nose daily.        . meloxicam (MOBIC) 7.5 MG tablet Take 1 tablet (7.5 mg total) by mouth daily.      . metoprolol (TOPROL-XL) 50 MG 24 hr tablet TAKE ONE AND ONE-HALF (1 & 1/2) TABLETS DAILY  135 tablet  0  . nitroGLYCERIN (NITROSTAT) 0.4 MG SL tablet Place 1 tablet (0.4 mg total) under the tongue every 5 (five) minutes as needed.  25 tablet  2  . Tamsulosin HCl (FLOMAX) 0.4 MG CAPS Take 0.4 mg by mouth daily.        Marland Kitchen omeprazole (PRILOSEC) 20 MG capsule Take 1 capsule (20 mg total) by mouth daily.      Marland Kitchen warfarin (COUMADIN) 5 MG tablet Take as directed        No Known Allergies  Past Medical History  Diagnosis Date  . Actinic keratosis   . ALLERGIC RHINITIS   . Atrial fibrillation   . CAD, NATIVE VESSEL   . Cellulitis and abscess of trunk   . CHEST PAIN, ACUTE   . HYPERLIPIDEMIA   . HYPERTENSION   . OTH MALIG NEOPLASM SKIN OTH&UNSPEC PARTS FACE     "?from sunburns"  . OTH MALIG NEOPLASM SKIN OTH\T\UNSPEC PARTS FACE   . PACEMAKER, PERMANENT 2008  . Myocardial infarction   . Angina   . GERD (gastroesophageal reflux disease)     Past Surgical History  Procedure Date  . Cystectomy     from right side of face.  . Cataract extraction w/ intraocular lens  implant, bilateral 2012  . Back surgery 2000    "for bulging discs"  . Insert / replace / remove pacemaker 2008    with lead rvision and microperforation  . Coronary angioplasty with stent placement 2008    "3"  . Coronary angioplasty  with stent placement 05/14/11    "1"    Family History  Problem Relation Age of Onset  . Colon cancer Other     History   Social History  . Marital Status: Married    Spouse Name: N/A    Number of Children: N/A  . Years of Education: N/A   Occupational History  . Not on file.   Social History Main Topics  . Smoking status: Never Smoker   . Smokeless tobacco: Never Used  . Alcohol Use: Yes     05/14/11 "last occasional drink was in 1990's"  . Drug Use: No  . Sexually Active: Yes   Other Topics Concern  . Not on file   Social History Narrative  . No narrative on file    ROS: Please see the HPI.  All other systems reviewed and negative.  PHYSICAL EXAM:  BP 128/60  Pulse 60  Ht 5\' 8"  (1.727 m)  Wt 94.257 kg (207 lb 12.8 oz)  BMI 31.60 kg/m2  General: Well  developed, well nourished, in no acute distress. Head:  Normocephalic and atraumatic. Neck: no JVD Lungs: Clear to auscultation and percussion. Heart: Normal S1 and S2.  No murmur, rubs or gallops.  Abdomen:  Normal bowel sounds; soft; non tender; no organomegaly Pulses: Pulses normal in all 4 extremities. Extremities: No clubbing or cyanosis. No edema. Neurologic: Alert and oriented x 3.  EKG:  ASSESSMENT AND PLAN:

## 2011-06-18 ENCOUNTER — Other Ambulatory Visit: Payer: Self-pay | Admitting: Internal Medicine

## 2011-06-21 ENCOUNTER — Ambulatory Visit (INDEPENDENT_AMBULATORY_CARE_PROVIDER_SITE_OTHER): Payer: MEDICARE | Admitting: *Deleted

## 2011-06-21 DIAGNOSIS — I4891 Unspecified atrial fibrillation: Secondary | ICD-10-CM

## 2011-06-21 LAB — POCT INR: INR: 1.7

## 2011-06-26 ENCOUNTER — Ambulatory Visit (INDEPENDENT_AMBULATORY_CARE_PROVIDER_SITE_OTHER): Payer: MEDICARE | Admitting: Internal Medicine

## 2011-06-26 ENCOUNTER — Other Ambulatory Visit: Payer: Self-pay | Admitting: *Deleted

## 2011-06-26 ENCOUNTER — Encounter: Payer: Self-pay | Admitting: Internal Medicine

## 2011-06-26 VITALS — BP 120/72 | HR 64 | Temp 98.0°F | Resp 16 | Ht 68.0 in | Wt 202.0 lb

## 2011-06-26 DIAGNOSIS — K219 Gastro-esophageal reflux disease without esophagitis: Secondary | ICD-10-CM

## 2011-06-26 DIAGNOSIS — E785 Hyperlipidemia, unspecified: Secondary | ICD-10-CM

## 2011-06-26 DIAGNOSIS — I10 Essential (primary) hypertension: Secondary | ICD-10-CM

## 2011-06-26 DIAGNOSIS — D696 Thrombocytopenia, unspecified: Secondary | ICD-10-CM

## 2011-06-26 MED ORDER — TAMSULOSIN HCL 0.4 MG PO CAPS: 0.4000 mg | ORAL_CAPSULE | Freq: Every day | ORAL | Status: DC

## 2011-06-26 NOTE — Patient Instructions (Signed)
The patient is instructed to continue all medications as prescribed. Schedule followup with check out clerk upon leaving the clinic  

## 2011-06-26 NOTE — Progress Notes (Signed)
Subjective:    Patient ID: Shawn [name], male    DOB: 1934/09/06, 77 y.o.   MRN: [id number]  HPI  This is a 77 year old male who is followed for hypertension hyperlipidemia gastroesophageal reflux he has recently been treated with a stent placement he was on Plavix for a brief period of time and now he is on his Coumadin and aspirin.  He also has atrial fibrillation he is in a controlled ventricular response atrial fibrillation with a heart rate between 60 and 70.  He feels that he is doing well he is compliant with his medications he is to follow up with cardiology and he has additional followup scheduled for next month with Dr. [name]  Review of Systems  Constitutional: Negative for fever and fatigue.  HENT: Negative for hearing loss, congestion, neck pain and postnasal drip.   Eyes: Negative for discharge, redness and visual disturbance.  Respiratory: Negative for cough, shortness of breath and wheezing.   Cardiovascular: Negative for leg swelling.  Gastrointestinal: Negative for abdominal pain, constipation and abdominal distention.  Genitourinary: Negative for urgency and frequency.  Musculoskeletal: Negative for joint swelling and arthralgias.  Skin: Negative for color change and rash.  Neurological: Negative for weakness and light-headedness.  Hematological: Negative for adenopathy.  Psychiatric/Behavioral: Negative for behavioral problems.   Past Medical History  Diagnosis Date  . Actinic keratosis   . ALLERGIC RHINITIS   . Atrial fibrillation   . CAD, NATIVE VESSEL   . Cellulitis and abscess of trunk   . CHEST PAIN, ACUTE   . HYPERLIPIDEMIA   . HYPERTENSION   . OTH MALIG NEOPLASM SKIN OTH&UNSPEC PARTS FACE     "?from sunburns"  . OTH MALIG NEOPLASM SKIN OTH\T\UNSPEC PARTS FACE   . PACEMAKER, PERMANENT 2008  . Myocardial infarction   . Angina   . GERD (gastroesophageal reflux disease)     History   Social History  . Marital Status: Married   Spouse Name: N/A    Number of Children: N/A  . Years of Education: N/A   Occupational History  . Not on file.   Social History Main Topics  . Smoking status: Never Smoker   . Smokeless tobacco: Never Used  . Alcohol Use: Yes     05/14/11 "last occasional drink was in 1990's"  . Drug Use: No  . Sexually Active: Yes   Other Topics Concern  . Not on file   Social History Narrative  . No narrative on file    Past Surgical History  Procedure Date  . Cystectomy     from right side of face.  . Cataract extraction w/ intraocular lens  implant, bilateral 2012  . Back surgery 2000    "for bulging discs"  . Insert / replace / remove pacemaker 2008    with lead rvision and microperforation  . Coronary angioplasty with stent placement 2008    "3"  . Coronary angioplasty with stent placement 05/14/11    "1"    Family History  Problem Relation Age of Onset  . Colon cancer Other     No Known Allergies  Current Outpatient Prescriptions on File Prior to Visit  Medication Sig Dispense Refill  . aspirin 81 MG tablet Take 81 mg by mouth daily.        Marland Kitchen atorvastatin (LIPITOR) 20 MG tablet Take 20 mg by mouth at bedtime.        . fluticasone (FLONASE) 50 MCG/ACT nasal spray Place 2 sprays into the nose daily.        Marland Kitchen  MATZIM LA 240 MG 24 hr tablet TAKE 1 TABLET DAILY  90 tablet  2  . meloxicam (MOBIC) 7.5 MG tablet Take 1 tablet (7.5 mg total) by mouth daily.      . metoprolol (TOPROL-XL) 50 MG 24 hr tablet TAKE ONE AND ONE-HALF (1 & 1/2) TABLETS DAILY  135 tablet  0  . nitroGLYCERIN (NITROSTAT) 0.4 MG SL tablet Place 1 tablet (0.4 mg total) under the tongue every 5 (five) minutes as needed.  25 tablet  2  . omeprazole (PRILOSEC) 20 MG capsule Take 1 capsule (20 mg total) by mouth daily.      Marland Kitchen warfarin (COUMADIN) 5 MG tablet Take as directed        BP 120/72  Pulse 64  Temp 98 F (36.7 C)  Resp 16  Ht 5\' 8"  (1.727 m)  Wt 202 lb (91.627 kg)  BMI 30.71 kg/m2         Objective:   Physical Exam  Nursing note and vitals reviewed. Constitutional: He appears well-developed and well-nourished.  HENT:  Head: Normocephalic and atraumatic.  Eyes: Conjunctivae are normal. Pupils are equal, round, and reactive to light.  Neck: Normal range of motion. Neck supple.  Cardiovascular: Normal rate and regular rhythm.   Pulmonary/Chest: Effort normal and breath sounds normal.  Abdominal: Soft. Bowel sounds are normal.          Assessment & Plan:  Visit ablation has ventricular response controlled he is on Coumadin his INR was slightly low at his last check and adjustment of the Coumadin was made and he has a followup appointment with the Coumadin clinic.  His blood pressure is well controlled on his current medications.  He is without chest pain after stent placement.  His cholesterol is at goal but he'll require a Medicare wellness examination and at that time we will do fasting blood work including a lipid panel.

## 2011-07-05 ENCOUNTER — Ambulatory Visit (INDEPENDENT_AMBULATORY_CARE_PROVIDER_SITE_OTHER): Payer: MEDICARE | Admitting: *Deleted

## 2011-07-05 DIAGNOSIS — I4891 Unspecified atrial fibrillation: Secondary | ICD-10-CM

## 2011-07-05 LAB — POCT INR: INR: 2.2

## 2011-07-19 ENCOUNTER — Encounter: Payer: Self-pay | Admitting: Cardiology

## 2011-07-19 ENCOUNTER — Ambulatory Visit (INDEPENDENT_AMBULATORY_CARE_PROVIDER_SITE_OTHER): Payer: MEDICARE | Admitting: *Deleted

## 2011-07-19 DIAGNOSIS — I495 Sick sinus syndrome: Secondary | ICD-10-CM

## 2011-07-22 LAB — REMOTE PACEMAKER DEVICE
AL IMPEDENCE PM: 376 Ohm
RV LEAD IMPEDENCE PM: 424 Ohm

## 2011-07-23 ENCOUNTER — Other Ambulatory Visit: Payer: Self-pay | Admitting: *Deleted

## 2011-07-23 DIAGNOSIS — I2510 Atherosclerotic heart disease of native coronary artery without angina pectoris: Secondary | ICD-10-CM

## 2011-07-23 DIAGNOSIS — I4891 Unspecified atrial fibrillation: Secondary | ICD-10-CM

## 2011-07-23 MED ORDER — WARFARIN SODIUM 2.5 MG PO TABS: 2.5000 mg | ORAL_TABLET | Freq: Every day | ORAL | Status: DC

## 2011-07-27 ENCOUNTER — Ambulatory Visit (INDEPENDENT_AMBULATORY_CARE_PROVIDER_SITE_OTHER): Payer: MEDICARE | Admitting: Pharmacist

## 2011-07-27 ENCOUNTER — Ambulatory Visit (INDEPENDENT_AMBULATORY_CARE_PROVIDER_SITE_OTHER): Payer: MEDICARE | Admitting: Cardiology

## 2011-07-27 ENCOUNTER — Encounter: Payer: Self-pay | Admitting: Cardiology

## 2011-07-27 VITALS — BP 128/78 | HR 89 | Ht 69.0 in | Wt 201.0 lb

## 2011-07-27 DIAGNOSIS — I10 Essential (primary) hypertension: Secondary | ICD-10-CM

## 2011-07-27 DIAGNOSIS — M79609 Pain in unspecified limb: Secondary | ICD-10-CM

## 2011-07-27 DIAGNOSIS — M79676 Pain in unspecified toe(s): Secondary | ICD-10-CM

## 2011-07-27 DIAGNOSIS — E785 Hyperlipidemia, unspecified: Secondary | ICD-10-CM

## 2011-07-27 DIAGNOSIS — I4891 Unspecified atrial fibrillation: Secondary | ICD-10-CM

## 2011-07-27 DIAGNOSIS — I2510 Atherosclerotic heart disease of native coronary artery without angina pectoris: Secondary | ICD-10-CM

## 2011-07-27 LAB — POCT INR: INR: 2.3

## 2011-07-27 NOTE — Assessment & Plan Note (Addendum)
Rate is well controlled.  Continue current meds.  Remains on warfarin.

## 2011-07-27 NOTE — Assessment & Plan Note (Signed)
At target.  Checked last June.

## 2011-07-27 NOTE — Assessment & Plan Note (Signed)
Well controlled 

## 2011-07-27 NOTE — Assessment & Plan Note (Signed)
No angina since stent was placed.

## 2011-07-27 NOTE — Assessment & Plan Note (Signed)
This could be early gout.  There is no erythema.  I encouraged him to get an appointment with Dr. Lovell Sheehan should this persist or worsen.  I reviewed the possibilities with him.  He knows to seek prompt attention if it gets erythematous or increasingly painful.

## 2011-07-27 NOTE — Patient Instructions (Signed)
Your physician recommends that you schedule a follow-up appointment in: 4 MONTHS  Your physician recommends that you continue on your current medications as directed. Please refer to the Current Medication list given to you today.   

## 2011-07-27 NOTE — Progress Notes (Signed)
PPM remote 

## 2011-07-27 NOTE — Progress Notes (Signed)
HPI:  He is doing well, and has not had any recurrent angina.  For the past two days he has some discomfort in the R big toe.  It is slightly worse with motion.  It is not erythematous.  He is on an antiinflammatory.  He wonders if this is gout.  No chest pain and tolerating things well.    Current Outpatient Prescriptions  Medication Sig Dispense Refill  . aspirin 81 MG tablet Take 81 mg by mouth daily.        Marland Kitchen atorvastatin (LIPITOR) 20 MG tablet Take 20 mg by mouth at bedtime.        . fluticasone (FLONASE) 50 MCG/ACT nasal spray Place 2 sprays into the nose daily.        Marland Kitchen MATZIM LA 240 MG 24 hr tablet TAKE 1 TABLET DAILY  90 tablet  2  . meloxicam (MOBIC) 7.5 MG tablet Take 1 tablet (7.5 mg total) by mouth daily.      . metoprolol (TOPROL-XL) 50 MG 24 hr tablet TAKE ONE AND ONE-HALF (1 & 1/2) TABLETS DAILY  135 tablet  0  . nitroGLYCERIN (NITROSTAT) 0.4 MG SL tablet Place 1 tablet (0.4 mg total) under the tongue every 5 (five) minutes as needed.  25 tablet  2  . omeprazole (PRILOSEC) 20 MG capsule Take 1 capsule (20 mg total) by mouth daily.      . Tamsulosin HCl (FLOMAX) 0.4 MG CAPS Take 1 capsule (0.4 mg total) by mouth daily.  90 capsule  3  . warfarin (COUMADIN) 2.5 MG tablet Take 1 tablet (2.5 mg total) by mouth daily.  50 tablet  11  . warfarin (COUMADIN) 5 MG tablet Take as directed        No Known Allergies  Past Medical History  Diagnosis Date  . Actinic keratosis   . ALLERGIC RHINITIS   . Atrial fibrillation   . CAD, NATIVE VESSEL   . Cellulitis and abscess of trunk   . CHEST PAIN, ACUTE   . HYPERLIPIDEMIA   . HYPERTENSION   . OTH MALIG NEOPLASM SKIN OTH&UNSPEC PARTS FACE     "?from sunburns"  . OTH MALIG NEOPLASM SKIN OTH\T\UNSPEC PARTS FACE   . PACEMAKER, PERMANENT 2008  . Myocardial infarction   . Angina   . GERD (gastroesophageal reflux disease)     Past Surgical History  Procedure Date  . Cystectomy     from right side of face.  . Cataract extraction  w/ intraocular lens  implant, bilateral 2012  . Back surgery 2000    "for bulging discs"  . Insert / replace / remove pacemaker 2008    with lead rvision and microperforation  . Coronary angioplasty with stent placement 2008    "3"  . Coronary angioplasty with stent placement 05/14/11    "1"    Family History  Problem Relation Age of Onset  . Colon cancer Other     History   Social History  . Marital Status: Married    Spouse Name: N/A    Number of Children: N/A  . Years of Education: N/A   Occupational History  . Not on file.   Social History Main Topics  . Smoking status: Never Smoker   . Smokeless tobacco: Never Used  . Alcohol Use: Yes     05/14/11 "last occasional drink was in 1990's"  . Drug Use: No  . Sexually Active: Yes   Other Topics Concern  . Not on file  Social History Narrative  . No narrative on file    ROS: Please see the HPI.  All other systems reviewed and negative.  PHYSICAL EXAM:  BP 128/78  Pulse 89  Ht 5\' 9"  (1.753 m)  Wt 201 lb (91.173 kg)  BMI 29.68 kg/m2  General: Well developed, well nourished, in no acute distress. Head:  Normocephalic and atraumatic. Neck: no JVD Lungs: Clear to auscultation and percussion. Heart: irregularly irregular rhythm without murmur.   Pulses: Pulses normal in all 4 extremities. Extremities: No clubbing or cyanosis. Slight tenderness of motion of R great toe without erythema.   Neurologic: Alert and oriented x 3.  EKG:  Atrial fibrillation with controlled ventricular response.    ASSESSMENT AND PLAN:

## 2011-07-30 ENCOUNTER — Ambulatory Visit (INDEPENDENT_AMBULATORY_CARE_PROVIDER_SITE_OTHER): Payer: MEDICARE | Admitting: Family

## 2011-07-30 ENCOUNTER — Encounter: Payer: Self-pay | Admitting: Family

## 2011-07-30 VITALS — BP 120/70 | Temp 98.6°F | Ht 68.0 in | Wt 201.0 lb

## 2011-07-30 DIAGNOSIS — M79671 Pain in right foot: Secondary | ICD-10-CM

## 2011-07-30 DIAGNOSIS — M109 Gout, unspecified: Secondary | ICD-10-CM

## 2011-07-30 DIAGNOSIS — M79609 Pain in unspecified limb: Secondary | ICD-10-CM

## 2011-07-30 MED ORDER — METHYLPREDNISOLONE ACETATE 40 MG/ML IJ SUSP
80.0000 mg | Freq: Once | INTRAMUSCULAR | Status: AC
  Administered 2011-07-30: 80 mg via INTRAMUSCULAR

## 2011-07-30 MED ORDER — HYDROCODONE-ACETAMINOPHEN 5-500 MG PO TABS: 1.0000 | ORAL_TABLET | Freq: Three times a day (TID) | ORAL | Status: AC | PRN

## 2011-07-30 MED ORDER — PREDNISONE 20 MG PO TABS: 20.0000 mg | ORAL_TABLET | Freq: Every day | ORAL | Status: AC

## 2011-07-30 NOTE — Progress Notes (Signed)
Subjective:    Patient ID: Shawn [name], male    DOB: 02/28/1935, 77 y.o.   MRN: [id number]  HPI 77 year old white male, nonsmoker, patient of Dr. Salley Scarlet is in today with complaints of pain, redness, and swelling in his right great toe the present for 2 days. The pain has worsened. Pain is about a 8/10 at its worse with walking. He has not taken any medication for relief at this point. Denies a high protein diet. He has never had a flare of arthritis or gout in this foot.  Review of Systems  Constitutional: Negative.   Respiratory: Negative.   Cardiovascular: Negative.   Musculoskeletal: Positive for joint swelling and arthralgias.       Right great toe pain, swelling, redness  Neurological: Negative.   Hematological: Negative.   Psychiatric/Behavioral: Negative.    Past Medical History  Diagnosis Date  . Actinic keratosis   . ALLERGIC RHINITIS   . Atrial fibrillation   . CAD, NATIVE VESSEL   . Cellulitis and abscess of trunk   . CHEST PAIN, ACUTE   . HYPERLIPIDEMIA   . HYPERTENSION   . OTH MALIG NEOPLASM SKIN OTH&UNSPEC PARTS FACE     "?from sunburns"  . OTH MALIG NEOPLASM SKIN OTH\T\UNSPEC PARTS FACE   . PACEMAKER, PERMANENT 2008  . Myocardial infarction   . Angina   . GERD (gastroesophageal reflux disease)     History   Social History  . Marital Status: Married    Spouse Name: N/A    Number of Children: N/A  . Years of Education: N/A   Occupational History  . Not on file.   Social History Main Topics  . Smoking status: Never Smoker   . Smokeless tobacco: Never Used  . Alcohol Use: Yes     05/14/11 "last occasional drink was in 1990's"  . Drug Use: No  . Sexually Active: Yes   Other Topics Concern  . Not on file   Social History Narrative  . No narrative on file    Past Surgical History  Procedure Date  . Cystectomy     from right side of face.  . Cataract extraction w/ intraocular lens  implant, bilateral 2012  . Back surgery 2000    "for  bulging discs"  . Insert / replace / remove pacemaker 2008    with lead rvision and microperforation  . Coronary angioplasty with stent placement 2008    "3"  . Coronary angioplasty with stent placement 05/14/11    "1"    Family History  Problem Relation Age of Onset  . Colon cancer Other     No Known Allergies  Current Outpatient Prescriptions on File Prior to Visit  Medication Sig Dispense Refill  . aspirin 81 MG tablet Take 81 mg by mouth daily.        Marland Kitchen atorvastatin (LIPITOR) 20 MG tablet Take 20 mg by mouth at bedtime.        . fluticasone (FLONASE) 50 MCG/ACT nasal spray Place 2 sprays into the nose daily.        Marland Kitchen MATZIM LA 240 MG 24 hr tablet TAKE 1 TABLET DAILY  90 tablet  2  . meloxicam (MOBIC) 7.5 MG tablet Take 1 tablet (7.5 mg total) by mouth daily.      . metoprolol (TOPROL-XL) 50 MG 24 hr tablet TAKE ONE AND ONE-HALF (1 & 1/2) TABLETS DAILY  135 tablet  0  . nitroGLYCERIN (NITROSTAT) 0.4 MG SL tablet Place 1 tablet (0.4  mg total) under the tongue every 5 (five) minutes as needed.  25 tablet  2  . omeprazole (PRILOSEC) 20 MG capsule Take 1 capsule (20 mg total) by mouth daily.      . Tamsulosin HCl (FLOMAX) 0.4 MG CAPS Take 1 capsule (0.4 mg total) by mouth daily.  90 capsule  3  . warfarin (COUMADIN) 2.5 MG tablet Take 1 tablet (2.5 mg total) by mouth daily.  50 tablet  11  . warfarin (COUMADIN) 5 MG tablet Take as directed       No current facility-administered medications on file prior to visit.    BP 120/70  Temp(Src) 98.6 F (37 C) (Oral)  Ht 5\' 8"  (1.727 m)  Wt 201 lb (91.173 kg)  BMI 30.56 kg/m2chart    Objective:   Physical Exam  Constitutional: He is oriented to person, place, and time. He appears well-developed and well-nourished.  Neck: Normal range of motion. Neck supple.  Cardiovascular: Normal rate, regular rhythm and normal heart sounds.   Pulmonary/Chest: Effort normal and breath sounds normal.  Musculoskeletal: He exhibits edema and  tenderness.       Redness, increased warmth, and swelling noted from the right great toe. Pain elicited to palpation.  Neurological: He is alert and oriented to person, place, and time.  Skin: Skin is warm and dry.  Psychiatric: He has a normal mood and affect.      Depo-Medrol 80 mg IM x1 given    Assessment & Plan:  Assessment: Gout, right great toe pain  Plan: Since patient is on Coumadin, prednisone 20 mg once daily x5 days. Vicodin one tablet every 8 hours when necessary pain. In shock she was given regarding gout. Uric acid level sent. She'll call the office if symptoms worsen or persist. Recheck as scheduled, and when necessary.

## 2011-07-30 NOTE — Patient Instructions (Signed)
Gout Gout is an inflammatory condition (arthritis) caused by a buildup of uric acid crystals in the joints. Uric acid is a chemical that is normally present in the blood. Under some circumstances, uric acid can form into crystals in your joints. This causes joint redness, soreness, and swelling (inflammation). Repeat attacks are common. Over time, uric acid crystals can form into masses (tophi) near a joint, causing disfigurement. Gout is treatable and often preventable. CAUSES  The disease begins with elevated levels of uric acid in the blood. Uric acid is produced by your body when it breaks down a naturally found substance called purines. This also happens when you eat certain foods such as meats and fish. Causes of an elevated uric acid level include:  Being passed down from parent to child (heredity).   Diseases that cause increased uric acid production (obesity, psoriasis, some cancers).   Excessive alcohol use.   Diet, especially diets rich in meat and seafood.   Medicines, including certain cancer-fighting drugs (chemotherapy), diuretics, and aspirin.   Chronic kidney disease. The kidneys are no longer able to remove uric acid well.   Problems with metabolism.  Conditions strongly associated with gout include:  Obesity.   High blood pressure.   High cholesterol.   Diabetes.  Not everyone with elevated uric acid levels gets gout. It is not understood why some people get gout and others do not. Surgery, joint injury, and eating too much of certain foods are some of the factors that can lead to gout. SYMPTOMS   An attack of gout comes on quickly. It causes intense pain with redness, swelling, and warmth in a joint.   Fever can occur.   Often, only one joint is involved. Certain joints are more commonly involved:   Base of the big toe.   Knee.   Ankle.   Wrist.   Finger.  Without treatment, an attack usually goes away in a few days to weeks. Between attacks, you  usually will not have symptoms, which is different from many other forms of arthritis. DIAGNOSIS  Your caregiver will suspect gout based on your symptoms and exam. Removal of fluid from the joint (arthrocentesis) is done to check for uric acid crystals. Your caregiver will give you a medicine that numbs the area (local anesthetic) and use a needle to remove joint fluid for exam. Gout is confirmed when uric acid crystals are seen in joint fluid, using a special microscope. Sometimes, blood, urine, and X-ray tests are also used. TREATMENT  There are 2 phases to gout treatment: treating the sudden onset (acute) attack and preventing attacks (prophylaxis). Treatment of an Acute Attack  Medicines are used. These include anti-inflammatory medicines or steroid medicines.   An injection of steroid medicine into the affected joint is sometimes necessary.   The painful joint is rested. Movement can worsen the arthritis.   You may use warm or cold treatments on painful joints, depending which works best for you.   Discuss the use of coffee, vitamin C, or cherries with your caregiver. These may be helpful treatment options.  Treatment to Prevent Attacks After the acute attack subsides, your caregiver may advise prophylactic medicine. These medicines either help your kidneys eliminate uric acid from your body or decrease your uric acid production. You may need to stay on these medicines for a very long time. The early phase of treatment with prophylactic medicine can be associated with an increase in acute gout attacks. For this reason, during the first few months   of treatment, your caregiver may also advise you to take medicines usually used for acute gout treatment. Be sure you understand your caregiver's directions. You should also discuss dietary treatment with your caregiver. Certain foods such as meats and fish can increase uric acid levels. Other foods such as dairy can decrease levels. Your caregiver  can give you a list of foods to avoid. HOME CARE INSTRUCTIONS   Do not take aspirin to relieve pain. This raises uric acid levels.   Only take over-the-counter or prescription medicines for pain, discomfort, or fever as directed by your caregiver.   Rest the joint as much as possible. When in bed, keep sheets and blankets off painful areas.   Keep the affected joint raised (elevated).   Use crutches if the painful joint is in your leg.   Drink enough water and fluids to keep your urine clear or pale yellow. This helps your body get rid of uric acid. Do not drink alcoholic beverages. They slow the passage of uric acid.   Follow your caregiver's dietary instructions. Pay careful attention to the amount of protein you eat. Your daily diet should emphasize fruits, vegetables, whole grains, and fat-free or low-fat milk products.   Maintain a healthy body weight.  SEEK MEDICAL CARE IF:   You have an oral temperature above 102 F (38.9 C).   You develop diarrhea, vomiting, or any side effects from medicines.   You do not feel better in 24 hours, or you are getting worse.  SEEK IMMEDIATE MEDICAL CARE IF:   Your joint becomes suddenly more tender and you have:   Chills.   An oral temperature above 102 F (38.9 C), not controlled by medicine.  MAKE SURE YOU:   Understand these instructions.   Will watch your condition.   Will get help right away if you are not doing well or get worse.  Document Released: 05/18/2000 Document Revised: 01/31/2011 Document Reviewed: 08/29/2009 [employer] Patient Information 2012 [employer]. 

## 2011-07-31 LAB — CBC
HCT: 45.3 % (ref 39.0–52.0)
RBC: 4.86 Mil/uL (ref 4.22–5.81)
RDW: 13.2 % (ref 11.5–14.6)
WBC: 11.1 10*3/uL — ABNORMAL HIGH (ref 4.5–10.5)

## 2011-08-07 ENCOUNTER — Encounter: Payer: Self-pay | Admitting: *Deleted

## 2011-08-24 ENCOUNTER — Ambulatory Visit (INDEPENDENT_AMBULATORY_CARE_PROVIDER_SITE_OTHER): Payer: MEDICARE | Admitting: *Deleted

## 2011-08-24 DIAGNOSIS — I4891 Unspecified atrial fibrillation: Secondary | ICD-10-CM

## 2011-08-24 LAB — POCT INR: INR: 3

## 2011-08-27 ENCOUNTER — Other Ambulatory Visit: Payer: Self-pay | Admitting: Internal Medicine

## 2011-09-17 ENCOUNTER — Telehealth: Payer: Self-pay | Admitting: Internal Medicine

## 2011-09-17 MED ORDER — DILTIAZEM HCL ER COATED BEADS 240 MG PO TB24: 240.0000 mg | ORAL_TABLET | Freq: Every day | ORAL | Status: DC

## 2011-09-17 NOTE — Telephone Encounter (Signed)
PT REQUESTING REFILL ON MATZIM LA 240 MG 24 hr tablet   [facility]

## 2011-09-20 ENCOUNTER — Telehealth: Payer: Self-pay | Admitting: Internal Medicine

## 2011-09-20 NOTE — Telephone Encounter (Signed)
Patient's spouse called stating that her husband's matzim should have been called into [facility] instead of [facility]. Please assist.

## 2011-09-20 NOTE — Telephone Encounter (Signed)
Pt has 3 pills left. Pt wife is aware MD out of office until tomorrow.

## 2011-09-21 MED ORDER — DILTIAZEM HCL ER COATED BEADS 240 MG PO TB24: 240.0000 mg | ORAL_TABLET | Freq: Every day | ORAL | Status: DC

## 2011-09-21 NOTE — Telephone Encounter (Signed)
rx sent in electronically 

## 2011-10-08 ENCOUNTER — Ambulatory Visit (INDEPENDENT_AMBULATORY_CARE_PROVIDER_SITE_OTHER): Payer: MEDICARE | Admitting: *Deleted

## 2011-10-08 DIAGNOSIS — I4891 Unspecified atrial fibrillation: Secondary | ICD-10-CM

## 2011-10-08 LAB — POCT INR: INR: 2.8

## 2011-10-18 ENCOUNTER — Ambulatory Visit (INDEPENDENT_AMBULATORY_CARE_PROVIDER_SITE_OTHER): Payer: MEDICARE | Admitting: *Deleted

## 2011-10-18 ENCOUNTER — Encounter: Payer: Self-pay | Admitting: Cardiology

## 2011-10-18 DIAGNOSIS — Z950 Presence of cardiac pacemaker: Secondary | ICD-10-CM

## 2011-10-18 DIAGNOSIS — I4891 Unspecified atrial fibrillation: Secondary | ICD-10-CM

## 2011-10-22 LAB — REMOTE PACEMAKER DEVICE
AL IMPEDENCE PM: 384 Ohm
ATRIAL PACING PM: 0
BRDY-0002RV: 60 {beats}/min
RV LEAD AMPLITUDE: 4.2 mv
RV LEAD IMPEDENCE PM: 448 Ohm

## 2011-11-05 ENCOUNTER — Encounter: Payer: Self-pay | Admitting: Internal Medicine

## 2011-11-05 ENCOUNTER — Ambulatory Visit (INDEPENDENT_AMBULATORY_CARE_PROVIDER_SITE_OTHER): Payer: MEDICARE | Admitting: Internal Medicine

## 2011-11-05 VITALS — BP 132/74 | HR 80 | Temp 98.0°F | Resp 16 | Ht 68.0 in | Wt 198.0 lb

## 2011-11-05 DIAGNOSIS — I10 Essential (primary) hypertension: Secondary | ICD-10-CM

## 2011-11-05 DIAGNOSIS — E785 Hyperlipidemia, unspecified: Secondary | ICD-10-CM

## 2011-11-05 DIAGNOSIS — Z0000 Encounter for general adult medical examination without abnormal findings: Secondary | ICD-10-CM

## 2011-11-05 DIAGNOSIS — I2510 Atherosclerotic heart disease of native coronary artery without angina pectoris: Secondary | ICD-10-CM

## 2011-11-05 DIAGNOSIS — Z23 Encounter for immunization: Secondary | ICD-10-CM

## 2011-11-05 DIAGNOSIS — I4891 Unspecified atrial fibrillation: Secondary | ICD-10-CM

## 2011-11-05 LAB — POCT URINALYSIS DIPSTICK
Bilirubin, UA: NEGATIVE
Glucose, UA: NEGATIVE
Leukocytes, UA: NEGATIVE
Nitrite, UA: NEGATIVE

## 2011-11-05 LAB — CBC WITH DIFFERENTIAL/PLATELET
Basophils Relative: 0.5 % (ref 0.0–3.0)
Eosinophils Absolute: 0.3 10*3/uL (ref 0.0–0.7)
Eosinophils Relative: 3.7 % (ref 0.0–5.0)
HCT: 46.5 % (ref 39.0–52.0)
Hemoglobin: 15.4 g/dL (ref 13.0–17.0)
Lymphs Abs: 1.7 10*3/uL (ref 0.7–4.0)
MCHC: 33 g/dL (ref 30.0–36.0)
MCV: 94.5 fl (ref 78.0–100.0)
Monocytes Absolute: 0.6 10*3/uL (ref 0.1–1.0)
Neutro Abs: 5.8 10*3/uL (ref 1.4–7.7)
Neutrophils Relative %: 68.5 % (ref 43.0–77.0)
RBC: 4.92 Mil/uL (ref 4.22–5.81)
WBC: 8.5 10*3/uL (ref 4.5–10.5)

## 2011-11-05 LAB — LIPID PANEL
Cholesterol: 102 mg/dL (ref 0–200)
VLDL: 20.2 mg/dL (ref 0.0–40.0)

## 2011-11-05 LAB — HEPATIC FUNCTION PANEL
ALT: 21 U/L (ref 0–53)
Bilirubin, Direct: 0.1 mg/dL (ref 0.0–0.3)
Total Bilirubin: 0.9 mg/dL (ref 0.3–1.2)

## 2011-11-05 LAB — BASIC METABOLIC PANEL
Chloride: 108 mEq/L (ref 96–112)
Creatinine, Ser: 1.1 mg/dL (ref 0.4–1.5)
GFR: 68.68 mL/min (ref >60.00)
Potassium: 4.3 mEq/L (ref 3.5–5.1)

## 2011-11-05 MED ORDER — ATORVASTATIN CALCIUM 20 MG PO TABS: 20.0000 mg | ORAL_TABLET | Freq: Every day | ORAL | Status: DC

## 2011-11-05 MED ORDER — NITROGLYCERIN 0.4 MG SL SUBL: 0.4000 mg | SUBLINGUAL_TABLET | SUBLINGUAL | Status: DC | PRN

## 2011-11-05 NOTE — Progress Notes (Signed)
Subjective:    Patient ID: Shawn [name], male    DOB: [birth year], 78 y.o.   MRN: [id number]  HPI Presents for Medicare wellness annual examination as well as for a followup visit for hyperlipidemia hypertension a history of native vessel coronary artery disease and a history of atrial fibrillation. His current medication includes Coumadin for anticoagulation the Toprol for rate control.  He uses Flonase for allergies he is on Lipitor for hyperlipidemia his blood pressures but well-controlled he has not had any chest pains or abnormal shortness of breath     Review of Systems  Constitutional: Negative for fever and fatigue.  HENT: Negative for hearing loss, congestion, neck pain and postnasal drip.   Eyes: Negative for discharge, redness and visual disturbance.  Respiratory: Negative for cough, shortness of breath and wheezing.   Cardiovascular: Negative for leg swelling.  Gastrointestinal: Negative for abdominal pain, constipation and abdominal distention.  Genitourinary: Negative for urgency and frequency.  Musculoskeletal: Negative for joint swelling and arthralgias.  Skin: Negative for color change and rash.  Neurological: Negative for weakness and light-headedness.  Hematological: Negative for adenopathy.  Psychiatric/Behavioral: Negative for behavioral problems.   Past Medical History  Diagnosis Date  . Actinic keratosis   . ALLERGIC RHINITIS   . Atrial fibrillation   . CAD, NATIVE VESSEL   . Cellulitis and abscess of trunk   . CHEST PAIN, ACUTE   . HYPERLIPIDEMIA   . HYPERTENSION   . OTH MALIG NEOPLASM SKIN OTH&UNSPEC PARTS FACE     "?from sunburns"  . OTH MALIG NEOPLASM SKIN OTH\T\UNSPEC PARTS FACE   . PACEMAKER, PERMANENT 2008  . Myocardial infarction   . Angina   . GERD (gastroesophageal reflux disease)     History   Social History  . Marital Status: Married    Spouse Name: N/A    Number of Children: N/A  . Years of Education: N/A   Occupational  History  . Not on file.   Social History Main Topics  . Smoking status: Never Smoker   . Smokeless tobacco: Never Used  . Alcohol Use: Yes     05/14/11 "last occasional drink was in 1990's"  . Drug Use: No  . Sexually Active: Yes   Other Topics Concern  . Not on file   Social History Narrative  . No narrative on file    Past Surgical History  Procedure Date  . Cystectomy     from right side of face.  . Cataract extraction w/ intraocular lens  implant, bilateral 2012  . Back surgery 2000    "for bulging discs"  . Insert / replace / remove pacemaker 2008    with lead rvision and microperforation  . Coronary angioplasty with stent placement 2008    "3"  . Coronary angioplasty with stent placement 05/14/11    "1"    Family History  Problem Relation Age of Onset  . Colon cancer Other     No Known Allergies  Current Outpatient Prescriptions on File Prior to Visit  Medication Sig Dispense Refill  . aspirin 81 MG tablet Take 81 mg by mouth daily.        Marland Kitchen diltiazem (MATZIM LA) 240 MG 24 hr tablet Take 1 tablet (240 mg total) by mouth daily.  30 tablet  0  . fluticasone (FLONASE) 50 MCG/ACT nasal spray Place 2 sprays into the nose daily.        . meloxicam (MOBIC) 7.5 MG tablet Take 1 tablet (7.5 mg total)  by mouth daily.      . metoprolol (TOPROL-XL) 50 MG 24 hr tablet TAKE ONE AND ONE-HALF (1 & 1/2) TABLETS DAILY  135 tablet  0  . omeprazole (PRILOSEC) 20 MG capsule Take 1 capsule (20 mg total) by mouth daily.      . Tamsulosin HCl (FLOMAX) 0.4 MG CAPS Take 1 capsule (0.4 mg total) by mouth daily.  90 capsule  3  . warfarin (COUMADIN) 2.5 MG tablet Take 1 tablet (2.5 mg total) by mouth daily.  50 tablet  11  . warfarin (COUMADIN) 5 MG tablet Take as directed      . DISCONTD: atorvastatin (LIPITOR) 20 MG tablet TAKE 1 TABLET ONCE DAILY  90 tablet  0  . DISCONTD: nitroGLYCERIN (NITROSTAT) 0.4 MG SL tablet Place 1 tablet (0.4 mg total) under the tongue every 5 (five) minutes  as needed.  25 tablet  2    BP 132/74  Pulse 80  Temp 98 F (36.7 C)  Resp 16  Ht 5\' 8"  (1.727 m)  Wt 198 lb (89.812 kg)  BMI 30.11 kg/m2       Objective:   Physical Exam  Nursing note and vitals reviewed. Constitutional: He appears well-developed and well-nourished.  HENT:  Head: Normocephalic and atraumatic.  Eyes: Conjunctivae are normal. Pupils are equal, round, and reactive to light.  Neck: Normal range of motion. Neck supple.  Cardiovascular: Normal rate and regular rhythm.   Pulmonary/Chest: Effort normal and breath sounds normal.  Abdominal: Soft. Bowel sounds are normal.          Assessment & Plan:  Today he will require a lipid profile to monitor how well he is doing on Lipitor as well as a liver panel to make sure he is having no side effects from the medications.  He'll need a basic metabolic panel to monitor his kidney function and potassium on the Toprol.  Since he is on Coumadin he should have a CBC with differential to make sure that he is not having any blood loss due to anticoagulation.  Would also check a urinalysis because he has benign prostatic hypertrophy on Flomax. He has stable allergic rhinitis Subjective:    Shawn [name] is a 77 y.o. male who presents for Medicare Annual/Subsequent preventive examination.   Preventive Screening-Counseling & Management  Tobacco History  Smoking status  . Never Smoker   Smokeless tobacco  . Never Used    Problems Prior to Visit 1.   Current Problems (verified) Patient Active Problem List  Diagnoses  . TINEA CRURIS  . OTH MALIG NEOPLASM SKIN OTH\T\UNSPEC PARTS FACE  . HYPERLIPIDEMIA  . HYPERTENSION  . CAD, NATIVE VESSEL  . ATRIAL FIBRILLATION  . ALLERGIC RHINITIS  . GERD  . CELLULITIS AND ABSCESS OF TRUNK  . SKIN TAG  . ACTINIC KERATOSIS  . CHEST PAIN, ACUTE  . UNS ADVRS EFF UNS RX MEDICINAL&BIOLOGICAL SBSTNC  . PACEMAKER, PERMANENT  . OTH MALIG NEOPLASM SKIN OTH&UNSPEC PARTS FACE   . Thrombocytopenia  . Toe pain    Medications Prior to Visit Current Outpatient Prescriptions on File Prior to Visit  Medication Sig Dispense Refill  . aspirin 81 MG tablet Take 81 mg by mouth daily.        Marland Kitchen diltiazem (MATZIM LA) 240 MG 24 hr tablet Take 1 tablet (240 mg total) by mouth daily.  30 tablet  0  . fluticasone (FLONASE) 50 MCG/ACT nasal spray Place 2 sprays into the nose daily.        Marland Kitchen  meloxicam (MOBIC) 7.5 MG tablet Take 1 tablet (7.5 mg total) by mouth daily.      . metoprolol (TOPROL-XL) 50 MG 24 hr tablet TAKE ONE AND ONE-HALF (1 & 1/2) TABLETS DAILY  135 tablet  0  . omeprazole (PRILOSEC) 20 MG capsule Take 1 capsule (20 mg total) by mouth daily.      . Tamsulosin HCl (FLOMAX) 0.4 MG CAPS Take 1 capsule (0.4 mg total) by mouth daily.  90 capsule  3  . warfarin (COUMADIN) 2.5 MG tablet Take 1 tablet (2.5 mg total) by mouth daily.  50 tablet  11  . warfarin (COUMADIN) 5 MG tablet Take as directed      . DISCONTD: atorvastatin (LIPITOR) 20 MG tablet TAKE 1 TABLET ONCE DAILY  90 tablet  0  . DISCONTD: nitroGLYCERIN (NITROSTAT) 0.4 MG SL tablet Place 1 tablet (0.4 mg total) under the tongue every 5 (five) minutes as needed.  25 tablet  2    Current Medications (verified) Current Outpatient Prescriptions  Medication Sig Dispense Refill  . aspirin 81 MG tablet Take 81 mg by mouth daily.        Marland Kitchen atorvastatin (LIPITOR) 20 MG tablet Take 1 tablet (20 mg total) by mouth daily.  90 tablet  3  . diltiazem (MATZIM LA) 240 MG 24 hr tablet Take 1 tablet (240 mg total) by mouth daily.  30 tablet  0  . fluticasone (FLONASE) 50 MCG/ACT nasal spray Place 2 sprays into the nose daily.        . meloxicam (MOBIC) 7.5 MG tablet Take 1 tablet (7.5 mg total) by mouth daily.      . metoprolol (TOPROL-XL) 50 MG 24 hr tablet TAKE ONE AND ONE-HALF (1 & 1/2) TABLETS DAILY  135 tablet  0  . nitroGLYCERIN (NITROSTAT) 0.4 MG SL tablet Place 1 tablet (0.4 mg total) under the tongue every 5 (five)  minutes as needed.  25 tablet  2  . omeprazole (PRILOSEC) 20 MG capsule Take 1 capsule (20 mg total) by mouth daily.      . Tamsulosin HCl (FLOMAX) 0.4 MG CAPS Take 1 capsule (0.4 mg total) by mouth daily.  90 capsule  3  . warfarin (COUMADIN) 2.5 MG tablet Take 1 tablet (2.5 mg total) by mouth daily.  50 tablet  11  . warfarin (COUMADIN) 5 MG tablet Take as directed      . DISCONTD: atorvastatin (LIPITOR) 20 MG tablet TAKE 1 TABLET ONCE DAILY  90 tablet  0  . DISCONTD: nitroGLYCERIN (NITROSTAT) 0.4 MG SL tablet Place 1 tablet (0.4 mg total) under the tongue every 5 (five) minutes as needed.  25 tablet  2     Allergies (verified) Review of patient's allergies indicates no known allergies.   PAST HISTORY  Family History Family History  Problem Relation Age of Onset  . Colon cancer Other     Social History History  Substance Use Topics  . Smoking status: Never Smoker   . Smokeless tobacco: Never Used  . Alcohol Use: Yes     05/14/11 "last occasional drink was in 1990's"    Are there smokers in your home (other than you)?  No  Risk Factors Current exercise habits: The patient does not participate in regular exercise at present.  Dietary issues discussed: weight control   Cardiac risk factors: advanced age (older than 68 for men, 76 for women), dyslipidemia, hypertension, male gender and sedentary lifestyle.  Depression Screen (Note: if answer to either of  the following is "Yes", a more complete depression screening is indicated)   Q1: Over the past two weeks, have you felt down, depressed or hopeless? No  Q2: Over the past two weeks, have you felt little interest or pleasure in doing things? No  Have you lost interest or pleasure in daily life? No  Do you often feel hopeless? No  Do you cry easily over simple problems? No  Activities of Daily Living In your present state of health, do you have any difficulty performing the following activities?:  Driving? No Managing  money?  No Feeding yourself? No Getting from bed to chair? No Climbing a flight of stairs? No Preparing food and eating?: No Bathing or showering? No Getting dressed: No Getting to the toilet? No Using the toilet:No Moving around from place to place: No In the past year have you fallen or had a near fall?:No   Are you sexually active?  Yes  Do you have more than one partner?  No  Hearing Difficulties: No Do you often ask people to speak up or repeat themselves? No Do you experience ringing or noises in your ears? No Do you have difficulty understanding soft or whispered voices? No   Do you feel that you have a problem with memory? No  Do you often misplace items? No  Do you feel safe at home?  No  Cognitive Testing  Alert? Yes  Normal Appearance?Yes  Oriented to person? Yes  Place? Yes   Time? Yes  Recall of three objects?  Yes  Can perform simple calculations? Yes  Displays appropriate judgment?Yes  Can read the correct time from a watch face?Yes   Advanced Directives have been discussed with the patient? Yes   List the Names of Other Physician/Practitioners you currently use: 1.    Indicate any recent Medical Services you may have received from other than [facility] providers in the past year (date may be approximate).  Immunization History  Administered Date(s) Administered  . Influenza Whole 03/10/2008, 03/16/2009, 04/05/2010  . Pneumococcal Polysaccharide 11/05/2011  . Tdap 11/05/2011    Screening Tests Health Maintenance  Topic Date Due  . Tetanus/tdap  01/20/1956  . Colonoscopy  01/20/1987  . Zostavax  01/19/1997  . Pneumococcal Polysaccharide Vaccine Age 69 And Over  01/19/2002  . Influenza Vaccine  03/04/2012    All answers were reviewed with the patient and necessary referrals were made:  Carrie Mew, MD   11/05/2011   History reviewed: allergies, current medications, past family history, past medical history, past social history, past surgical  history and problem list  Review of Systems A comprehensive review of systems was negative.    Objective:     Vision by Snellen chart: right eye:20/30, left eye:20/30 Blood pressure 132/74, pulse 80, temperature 98 F (36.7 C), resp. rate 16, height 5\' 8"  (1.727 m), weight 198 lb (89.812 kg). Body mass index is 30.11 kg/(m^2).  BP 132/74  Pulse 80  Temp 98 F (36.7 C)  Resp 16  Ht 5\' 8"  (1.727 m)  Wt 198 lb (89.812 kg)  BMI 30.11 kg/m2  General Appearance:    Alert, cooperative, no distress, appears stated age  Head:    Normocephalic, without obvious abnormality, atraumatic  Eyes:    PERRL, conjunctiva/corneas clear, EOM's intact, fundi    benign, both eyes       Ears:    Normal TM's and external ear canals, both ears  Nose:   Nares normal, septum midline, mucosa normal, no  drainage    or sinus tenderness  Throat:   Lips, mucosa, and tongue normal; teeth and gums normal  Neck:   Supple, symmetrical, trachea midline, no adenopathy;       thyroid:  No enlargement/tenderness/nodules; no carotid   bruit or JVD  Back:     Symmetric, no curvature, ROM normal, no CVA tenderness  Lungs:     Clear to auscultation bilaterally, respirations unlabored  Chest wall:    No tenderness or deformity  Heart:    Regular rate and rhythm, S1 and S2 normal, no murmur, rub   or gallop  Abdomen:     Soft, non-tender, bowel sounds active all four quadrants,    no masses, no organomegaly  Genitalia:    Normal male without lesion, discharge or tenderness  Rectal:    Normal tone, normal prostate, no masses or tenderness;   guaiac negative stool  Extremities:   Extremities normal, atraumatic, no cyanosis or edema  Pulses:   2+ and symmetric all extremities  Skin:   Skin color, texture, turgor normal, no rashes or lesions  Lymph nodes:   Cervical, supraclavicular, and axillary nodes normal  Neurologic:   CNII-XII intact. Normal strength, sensation and reflexes      throughout       Assessment:       Patient presents for yearly preventative medicine examination.   all immunizations and health maintenance protocols were reviewed with the patient and they are up to date with these protocols.   screening laboratory values were reviewed with the patient including screening of hyperlipidemia PSA renal function and hepatic function.   There medications past medical history social history problem list and allergies were reviewed in detail.   Goals were established with regard to weight loss exercise diet in compliance with medications      Plan:     During the course of the visit the patient was educated and counseled about appropriate screening and preventive services including:    Pneumococcal vaccine   Influenza vaccine  Td vaccine  Diet review for nutrition referral? Yes ____  Not Indicated ____x   Patient Instructions (the written plan) was given to the patient.  Medicare Attestation I have personally reviewed: The patient's medical and social history Their use of alcohol, tobacco or illicit drugs Their current medications and supplements The patient's functional ability including ADLs,fall risks, home safety risks, cognitive, and hearing and visual impairment Diet and physical activities Evidence for depression or mood disorders  The patient's weight, height, BMI, and visual acuity have been recorded in the chart.  I have made referrals, counseling, and provided education to the patient based on review of the above and I have provided the patient with a written personalized care plan for preventive services.     Carrie Mew, MD   11/05/2011

## 2011-11-05 NOTE — Progress Notes (Signed)
Addended by: Franchot Mimes on: 11/05/2011 12:13 PM   Modules accepted: Orders

## 2011-11-05 NOTE — Patient Instructions (Signed)
The patient is instructed to continue all medications as prescribed. Schedule followup with check out clerk upon leaving the clinic  

## 2011-11-08 ENCOUNTER — Other Ambulatory Visit: Payer: Self-pay | Admitting: *Deleted

## 2011-11-08 MED ORDER — METOPROLOL SUCCINATE ER 50 MG PO TB24: 50.0000 mg | ORAL_TABLET | Freq: Every day | ORAL | Status: DC

## 2011-11-12 ENCOUNTER — Encounter: Payer: Self-pay | Admitting: Cardiology

## 2011-11-12 ENCOUNTER — Ambulatory Visit (INDEPENDENT_AMBULATORY_CARE_PROVIDER_SITE_OTHER): Payer: MEDICARE | Admitting: *Deleted

## 2011-11-12 ENCOUNTER — Ambulatory Visit (INDEPENDENT_AMBULATORY_CARE_PROVIDER_SITE_OTHER): Payer: MEDICARE | Admitting: Cardiology

## 2011-11-12 VITALS — BP 110/50 | HR 80 | Ht 68.0 in | Wt 198.0 lb

## 2011-11-12 DIAGNOSIS — E785 Hyperlipidemia, unspecified: Secondary | ICD-10-CM

## 2011-11-12 DIAGNOSIS — I10 Essential (primary) hypertension: Secondary | ICD-10-CM

## 2011-11-12 DIAGNOSIS — I4891 Unspecified atrial fibrillation: Secondary | ICD-10-CM

## 2011-11-12 DIAGNOSIS — I2510 Atherosclerotic heart disease of native coronary artery without angina pectoris: Secondary | ICD-10-CM

## 2011-11-12 LAB — POCT INR: INR: 2.5

## 2011-11-12 MED ORDER — METOPROLOL SUCCINATE ER 50 MG PO TB24: ORAL_TABLET | ORAL | Status: DC

## 2011-11-12 NOTE — Assessment & Plan Note (Signed)
Currently controlled.  Says they occasionally will get up to as high as 170, but usually in the 130-140 range.  Normal today.

## 2011-11-12 NOTE — Assessment & Plan Note (Signed)
See results.  At target seven days ago.  TS

## 2011-11-12 NOTE — Assessment & Plan Note (Signed)
Rate is controlled at present.  On rate blocking drugs with pacing backup.

## 2011-11-12 NOTE — Assessment & Plan Note (Signed)
No recurrent chest pain with BMS in December.  Has had good luck with this platform.  Other stents are patent.  Continue medical therapy.

## 2011-11-12 NOTE — Patient Instructions (Signed)
Your physician wants you to follow-up in: 3 MONTHS You will receive a reminder letter in the mail two months in advance. If you don't receive a letter, please call our office to schedule the follow-up appointment.   METOPROLOL TAKE ONE AND ONE HJALF TABLETS ONCE DAILY

## 2011-11-12 NOTE — Progress Notes (Signed)
HPI:  The patient is in for follow up.  Overall he is stable.  He is not having any current issues.  Denies any chest pain.  BPs at home tend to run in the 130-140 range.  Occasionally higher.  Meds reviewed today as there was some confusion regarding his Toprol XL dose.    Current Outpatient Prescriptions  Medication Sig Dispense Refill  . aspirin 81 MG tablet Take 81 mg by mouth daily.        Marland Kitchen atorvastatin (LIPITOR) 20 MG tablet Take 1 tablet (20 mg total) by mouth daily.  90 tablet  3  . diltiazem (MATZIM LA) 240 MG 24 hr tablet Take 1 tablet (240 mg total) by mouth daily.  30 tablet  0  . fluticasone (FLONASE) 50 MCG/ACT nasal spray Place 2 sprays into the nose daily.        . meloxicam (MOBIC) 7.5 MG tablet Take 1 tablet (7.5 mg total) by mouth daily.      . metoprolol succinate (TOPROL-XL) 50 MG 24 hr tablet Take 1 tablet (50 mg total) by mouth daily. Take with or immediately following a meal.  135 tablet  2  . nitroGLYCERIN (NITROSTAT) 0.4 MG SL tablet Place 1 tablet (0.4 mg total) under the tongue every 5 (five) minutes as needed.  25 tablet  2  . Tamsulosin HCl (FLOMAX) 0.4 MG CAPS Take 1 capsule (0.4 mg total) by mouth daily.  90 capsule  3  . warfarin (COUMADIN) 2.5 MG tablet Take 1 tablet (2.5 mg total) by mouth daily.  50 tablet  11  . warfarin (COUMADIN) 5 MG tablet Take as directed      . omeprazole (PRILOSEC) 20 MG capsule Take 1 capsule (20 mg total) by mouth daily.        No Known Allergies  Past Medical History  Diagnosis Date  . Actinic keratosis   . ALLERGIC RHINITIS   . Atrial fibrillation   . CAD, NATIVE VESSEL   . Cellulitis and abscess of trunk   . CHEST PAIN, ACUTE   . HYPERLIPIDEMIA   . HYPERTENSION   . OTH MALIG NEOPLASM SKIN OTH&UNSPEC PARTS FACE     "?from sunburns"  . OTH MALIG NEOPLASM SKIN OTH\T\UNSPEC PARTS FACE   . PACEMAKER, PERMANENT 2008  . Myocardial infarction   . Angina   . GERD (gastroesophageal reflux disease)     Past Surgical  History  Procedure Date  . Cystectomy     from right side of face.  . Cataract extraction w/ intraocular lens  implant, bilateral 2012  . Back surgery 2000    "for bulging discs"  . Insert / replace / remove pacemaker 2008    with lead rvision and microperforation  . Coronary angioplasty with stent placement 2008    "3"  . Coronary angioplasty with stent placement 05/14/11    "1"    Family History  Problem Relation Age of Onset  . Colon cancer Other     History   Social History  . Marital Status: Married    Spouse Name: N/A    Number of Children: N/A  . Years of Education: N/A   Occupational History  . Not on file.   Social History Main Topics  . Smoking status: Never Smoker   . Smokeless tobacco: Never Used  . Alcohol Use: Yes     05/14/11 "last occasional drink was in 1990's"  . Drug Use: No  . Sexually Active: Yes   Other  Topics Concern  . Not on file   Social History Narrative  . No narrative on file    ROS: Please see the HPI.  All other systems reviewed and negative.  PHYSICAL EXAM:  BP 110/50  Pulse 80  Ht 5\' 8"  (1.727 m)  Wt 198 lb (89.812 kg)  BMI 30.11 kg/m2  General: Well developed, well nourished, in no acute distress. Head:  Normocephalic and atraumatic. Neck: no JVD.  Pacer site looks good.   Lungs: Clear to auscultation and percussion. Heart: Normal S1 and S2.  No murmur, rubs or gallops.  Abdomen:  Normal bowel sounds; soft; non tender; no organomegaly Pulses: Pulses normal in all 4 extremities. Extremities: No clubbing or cyanosis. No edema. Neurologic: Alert and oriented x 3.  EKG:  Atrial fibrillation.  V pacing backup.  Leftward axis.  Lower voltage, but baseline.   ASSESSMENT AND PLAN:

## 2011-11-25 ENCOUNTER — Other Ambulatory Visit: Payer: Self-pay | Admitting: Internal Medicine

## 2011-12-24 ENCOUNTER — Encounter: Payer: Self-pay | Admitting: *Deleted

## 2011-12-24 ENCOUNTER — Ambulatory Visit (INDEPENDENT_AMBULATORY_CARE_PROVIDER_SITE_OTHER): Payer: MEDICARE | Admitting: *Deleted

## 2011-12-24 DIAGNOSIS — I4891 Unspecified atrial fibrillation: Secondary | ICD-10-CM

## 2012-01-24 ENCOUNTER — Ambulatory Visit (INDEPENDENT_AMBULATORY_CARE_PROVIDER_SITE_OTHER): Payer: MEDICARE | Admitting: *Deleted

## 2012-01-24 DIAGNOSIS — I4891 Unspecified atrial fibrillation: Secondary | ICD-10-CM

## 2012-01-24 DIAGNOSIS — Z950 Presence of cardiac pacemaker: Secondary | ICD-10-CM

## 2012-01-29 LAB — REMOTE PACEMAKER DEVICE
ATRIAL PACING PM: 3.1
RV LEAD IMPEDENCE PM: 424 Ohm

## 2012-02-05 ENCOUNTER — Ambulatory Visit (INDEPENDENT_AMBULATORY_CARE_PROVIDER_SITE_OTHER): Payer: MEDICARE | Admitting: *Deleted

## 2012-02-05 DIAGNOSIS — I4891 Unspecified atrial fibrillation: Secondary | ICD-10-CM

## 2012-02-05 LAB — POCT INR: INR: 2.5

## 2012-02-13 ENCOUNTER — Ambulatory Visit: Payer: MEDICARE | Admitting: Cardiology

## 2012-03-04 ENCOUNTER — Ambulatory Visit (INDEPENDENT_AMBULATORY_CARE_PROVIDER_SITE_OTHER): Payer: MEDICARE

## 2012-03-04 ENCOUNTER — Ambulatory Visit
Admission: RE | Admit: 2012-03-04 | Discharge: 2012-03-04 | Disposition: A | Discharge status: Home / Self Care | Payer: MEDICARE | Source: Ambulatory Visit | Attending: Cardiology | Admitting: Cardiology

## 2012-03-04 ENCOUNTER — Encounter: Payer: Self-pay | Admitting: Cardiology

## 2012-03-04 ENCOUNTER — Ambulatory Visit (INDEPENDENT_AMBULATORY_CARE_PROVIDER_SITE_OTHER): Payer: MEDICARE | Admitting: Cardiology

## 2012-03-04 VITALS — BP 118/70 | HR 74 | Resp 18 | Ht 68.0 in | Wt 197.0 lb

## 2012-03-04 DIAGNOSIS — J209 Acute bronchitis, unspecified: Secondary | ICD-10-CM

## 2012-03-04 DIAGNOSIS — I2510 Atherosclerotic heart disease of native coronary artery without angina pectoris: Secondary | ICD-10-CM

## 2012-03-04 DIAGNOSIS — I4891 Unspecified atrial fibrillation: Secondary | ICD-10-CM

## 2012-03-04 DIAGNOSIS — R05 Cough: Secondary | ICD-10-CM

## 2012-03-04 DIAGNOSIS — R059 Cough, unspecified: Secondary | ICD-10-CM

## 2012-03-04 NOTE — Assessment & Plan Note (Signed)
The patient has had an upper respriatory infection. He has not been febrile or producing a lot of sputum, but he does have focal findings on exam. Based on this, I am in [place] to get a chest x-ray on him. The patient is on warfarin, and antibiotic prescription will need to be carefully tailored if it's necessary. We've made an appointment to see Dr. Lovell Sheehan or his extender tomorrow in the clinic. We will get a chest x-ray tonight. The patient is feeling somewhat better today, but I think this would be important

## 2012-03-04 NOTE — Patient Instructions (Addendum)
A chest x-ray takes a picture of the organs and structures inside the chest, including the heart, lungs, and blood vessels. This test can show several things, including, whether the heart is enlarges; whether fluid is building up in the lungs; and whether pacemaker / defibrillator leads are still in place.  Your physician recommends that you schedule a follow-up appointment in: January 2014  Your physician recommends that you continue on your current medications as directed. Please refer to the Current Medication list given to you today.

## 2012-03-04 NOTE — Assessment & Plan Note (Signed)
Rate is currently controlled. He remains on chronic warfarin therapy. No new symptoms.

## 2012-03-04 NOTE — Assessment & Plan Note (Signed)
He's had no symptoms since his intervention. He continues to get along extremely well.

## 2012-03-04 NOTE — Progress Notes (Signed)
HPI:  The patient returns in followup. From a cardiac standpoint he is been stable. However, over the weekend he developed some congestion.   He felt some chills yesterday but none today. He has not been producing sputum, but does feel congested. He's had a little bit of cough from time to time. No fever.    Current Outpatient Prescriptions  Medication Sig Dispense Refill  . aspirin 81 MG tablet Take 81 mg by mouth daily.        Marland Kitchen atorvastatin (LIPITOR) 20 MG tablet TAKE 1 TABLET ONCE DAILY  90 tablet  3  . diltiazem (MATZIM LA) 240 MG 24 hr tablet Take 1 tablet (240 mg total) by mouth daily.  30 tablet  0  . fluticasone (FLONASE) 50 MCG/ACT nasal spray Place 2 sprays into the nose daily.        . meloxicam (MOBIC) 7.5 MG tablet Take 1 tablet (7.5 mg total) by mouth daily.      . metoprolol succinate (TOPROL-XL) 50 MG 24 hr tablet One and one half tablets once daily  135 tablet  4  . nitroGLYCERIN (NITROSTAT) 0.4 MG SL tablet Place 1 tablet (0.4 mg total) under the tongue every 5 (five) minutes as needed.  25 tablet  2  . omeprazole (PRILOSEC) 20 MG capsule Take 1 capsule (20 mg total) by mouth daily.      . Tamsulosin HCl (FLOMAX) 0.4 MG CAPS Take 1 capsule (0.4 mg total) by mouth daily.  90 capsule  3  . warfarin (COUMADIN) 2.5 MG tablet Take 1 tablet (2.5 mg total) by mouth daily.  50 tablet  11  . warfarin (COUMADIN) 5 MG tablet Take as directed        No Known Allergies  Past Medical History  Diagnosis Date  . Actinic keratosis   . ALLERGIC RHINITIS   . Atrial fibrillation   . CAD, NATIVE VESSEL   . Cellulitis and abscess of trunk   . CHEST PAIN, ACUTE   . HYPERLIPIDEMIA   . HYPERTENSION   . OTH MALIG NEOPLASM SKIN OTH&UNSPEC PARTS FACE     "?from sunburns"  . OTH MALIG NEOPLASM SKIN OTH\T\UNSPEC PARTS FACE   . PACEMAKER, PERMANENT 2008  . Myocardial infarction   . Angina   . GERD (gastroesophageal reflux disease)     Past Surgical History  Procedure Date  .  Cystectomy     from right side of face.  . Cataract extraction w/ intraocular lens  implant, bilateral 2012  . Back surgery 2000    "for bulging discs"  . Insert / replace / remove pacemaker 2008    with lead rvision and microperforation  . Coronary angioplasty with stent placement 2008    "3"  . Coronary angioplasty with stent placement 05/14/11    "1"    Family History  Problem Relation Age of Onset  . Colon cancer Other     History   Social History  . Marital Status: Married    Spouse Name: N/A    Number of Children: N/A  . Years of Education: N/A   Occupational History  . Not on file.   Social History Main Topics  . Smoking status: Never Smoker   . Smokeless tobacco: Never Used  . Alcohol Use: Yes     05/14/11 "last occasional drink was in 1990's"  . Drug Use: No  . Sexually Active: Yes   Other Topics Concern  . Not on file   Social History Narrative  .  No narrative on file    ROS: Please see the HPI.  All other systems reviewed and negative.  PHYSICAL EXAM:  BP 118/70  Pulse 74  Resp 18  Ht 5\' 8"  (1.727 m)  Wt 197 lb (89.359 kg)  BMI 29.95 kg/m2  SpO2 97%  General: Well developed, well nourished, in no acute distress. Head:  Normocephalic and atraumatic. Neck: no JVD Lungs: Expiratory ronchii over right lung.   Heart: irregular irregular.   Pulses: Pulses normal in all 4 extremities. Extremities: No clubbing or cyanosis. No edema. Neurologic: Alert and oriented x 3.  EKG:  ASSESSMENT AND PLAN:

## 2012-03-05 ENCOUNTER — Ambulatory Visit (INDEPENDENT_AMBULATORY_CARE_PROVIDER_SITE_OTHER): Payer: MEDICARE | Admitting: Family

## 2012-03-05 ENCOUNTER — Encounter: Payer: Self-pay | Admitting: Family

## 2012-03-05 ENCOUNTER — Telehealth: Payer: Self-pay | Admitting: Internal Medicine

## 2012-03-05 VITALS — BP 128/72 | HR 114 | Temp 100.1°F | Wt 197.0 lb

## 2012-03-05 DIAGNOSIS — R509 Fever, unspecified: Secondary | ICD-10-CM

## 2012-03-05 DIAGNOSIS — R062 Wheezing: Secondary | ICD-10-CM

## 2012-03-05 DIAGNOSIS — J189 Pneumonia, unspecified organism: Secondary | ICD-10-CM

## 2012-03-05 MED ORDER — PREDNISONE 20 MG PO TABS: 40.0000 mg | ORAL_TABLET | Freq: Every day | ORAL | Status: DC

## 2012-03-05 MED ORDER — PREDNISONE 20 MG PO TABS: 40.0000 mg | ORAL_TABLET | Freq: Every day | ORAL | Status: AC

## 2012-03-05 MED ORDER — CEFUROXIME AXETIL 250 MG PO TABS: 250.0000 mg | ORAL_TABLET | Freq: Two times a day (BID) | ORAL | Status: DC

## 2012-03-05 NOTE — Telephone Encounter (Signed)
Pt called and said that scripts were supposed to be sent in to [facility] pharmacy and so for they havent been rcvd according to pharmacy. Pls send predniSONE (DELTASONE) 20 MG tablet and cefUROXime (CEFTIN) 250 MG tablet to [facility].

## 2012-03-05 NOTE — Patient Instructions (Signed)

## 2012-03-05 NOTE — Telephone Encounter (Signed)
Verbal given to pharmacist and  Pt aware

## 2012-03-05 NOTE — Progress Notes (Signed)
Subjective:    Patient ID: Shawn [name], male    DOB: 18-Apr-1935, 77 y.o.   MRN: [id number]  HPI 77 year old white male, nonsmoker, is in today with c/o cough, wheezing, and fever x 5 days and worsening. Cardiology scheduled this appointment for his and obtained an xray that showed pneumonia. Has not been taking any OTC medication.    Review of Systems  Constitutional: Positive for fever.  HENT: Negative.   Respiratory: Positive for cough and wheezing.   Cardiovascular: Negative.   Skin: Negative.   Hematological: Negative.   Psychiatric/Behavioral: Negative.    Past Medical History  Diagnosis Date  . Actinic keratosis   . ALLERGIC RHINITIS   . Atrial fibrillation   . CAD, NATIVE VESSEL   . Cellulitis and abscess of trunk   . CHEST PAIN, ACUTE   . HYPERLIPIDEMIA   . HYPERTENSION   . OTH MALIG NEOPLASM SKIN OTH&UNSPEC PARTS FACE     "?from sunburns"  . OTH MALIG NEOPLASM SKIN OTH\T\UNSPEC PARTS FACE   . PACEMAKER, PERMANENT 2008  . Myocardial infarction   . Angina   . GERD (gastroesophageal reflux disease)     History   Social History  . Marital Status: Married    Spouse Name: N/A    Number of Children: N/A  . Years of Education: N/A   Occupational History  . Not on file.   Social History Main Topics  . Smoking status: Never Smoker   . Smokeless tobacco: Never Used  . Alcohol Use: Yes     05/14/11 "last occasional drink was in 1990's"  . Drug Use: No  . Sexually Active: Yes   Other Topics Concern  . Not on file   Social History Narrative  . No narrative on file    Past Surgical History  Procedure Date  . Cystectomy     from right side of face.  . Cataract extraction w/ intraocular lens  implant, bilateral 2012  . Back surgery 2000    "for bulging discs"  . Insert / replace / remove pacemaker 2008    with lead rvision and microperforation  . Coronary angioplasty with stent placement 2008    "3"  . Coronary angioplasty with stent placement  05/14/11    "1"    Family History  Problem Relation Age of Onset  . Colon cancer Other     No Known Allergies  Current Outpatient Prescriptions on File Prior to Visit  Medication Sig Dispense Refill  . aspirin 81 MG tablet Take 81 mg by mouth daily.        Marland Kitchen atorvastatin (LIPITOR) 20 MG tablet TAKE 1 TABLET ONCE DAILY  90 tablet  3  . diltiazem (MATZIM LA) 240 MG 24 hr tablet Take 1 tablet (240 mg total) by mouth daily.  30 tablet  0  . fluticasone (FLONASE) 50 MCG/ACT nasal spray Place 2 sprays into the nose daily.        . meloxicam (MOBIC) 7.5 MG tablet Take 1 tablet (7.5 mg total) by mouth daily.      . metoprolol succinate (TOPROL-XL) 50 MG 24 hr tablet One and one half tablets once daily  135 tablet  4  . nitroGLYCERIN (NITROSTAT) 0.4 MG SL tablet Place 1 tablet (0.4 mg total) under the tongue every 5 (five) minutes as needed.  25 tablet  2  . omeprazole (PRILOSEC) 20 MG capsule Take 1 capsule (20 mg total) by mouth daily.      Marland Kitchen  Tamsulosin HCl (FLOMAX) 0.4 MG CAPS Take 1 capsule (0.4 mg total) by mouth daily.  90 capsule  3  . warfarin (COUMADIN) 2.5 MG tablet Take 1 tablet (2.5 mg total) by mouth daily.  50 tablet  11  . warfarin (COUMADIN) 5 MG tablet Take as directed        BP 128/72  Pulse 114  Temp 100.1 F (37.8 C) (Oral)  Wt 197 lb (89.359 kg)  SpO2 95%chart    Objective:   Physical Exam  Constitutional: He is oriented to person, place, and time. He appears well-developed and well-nourished.  HENT:  Right Ear: External ear normal.  Left Ear: External ear normal.  Nose: Nose normal.  Mouth/Throat: Oropharynx is clear and moist.  Eyes: Conjunctivae normal are normal. Pupils are equal, round, and reactive to light.  Neck: Normal range of motion. Neck supple.  Cardiovascular: Normal rate, regular rhythm and normal heart sounds.   Pulmonary/Chest: Effort normal. He has wheezes.  Musculoskeletal: Normal range of motion.  Neurological: He is alert and oriented to  person, place, and time.  Skin: Skin is warm and dry.  Psychiatric: He has a normal mood and affect.          Assessment & Plan:  Assessment: Pneumonia, Wheezing, Fever  Plan: Ceftin 250mg  BID x 10 days. Prednisone 40mg  daily x 5 days. Call the office if symptoms worsen or persist. Recheck for INR in 2 weeks.

## 2012-03-06 ENCOUNTER — Encounter: Payer: Self-pay | Admitting: Gastroenterology

## 2012-03-07 ENCOUNTER — Ambulatory Visit (INDEPENDENT_AMBULATORY_CARE_PROVIDER_SITE_OTHER): Payer: MEDICARE | Admitting: *Deleted

## 2012-03-07 DIAGNOSIS — I4891 Unspecified atrial fibrillation: Secondary | ICD-10-CM

## 2012-03-07 LAB — POCT INR: INR: 3.8

## 2012-03-18 ENCOUNTER — Encounter: Payer: Self-pay | Admitting: *Deleted

## 2012-03-24 ENCOUNTER — Ambulatory Visit (INDEPENDENT_AMBULATORY_CARE_PROVIDER_SITE_OTHER): Payer: MEDICARE | Admitting: Internal Medicine

## 2012-03-24 ENCOUNTER — Encounter: Payer: Self-pay | Admitting: Internal Medicine

## 2012-03-24 VITALS — BP 118/74 | HR 76 | Temp 98.6°F | Resp 16 | Ht 68.0 in | Wt 198.0 lb

## 2012-03-24 DIAGNOSIS — I10 Essential (primary) hypertension: Secondary | ICD-10-CM

## 2012-03-24 DIAGNOSIS — I4891 Unspecified atrial fibrillation: Secondary | ICD-10-CM

## 2012-03-24 DIAGNOSIS — Z23 Encounter for immunization: Secondary | ICD-10-CM

## 2012-03-24 NOTE — Progress Notes (Signed)
Subjective:    Patient ID: Shawn [name], male    DOB: Feb 16, 1935, 77 y.o.   MRN: [id number]  HPI Patient is a 77 year old who presents for followup of hypertension hyperlipidemia with atrial fibrillation.  He was recently seen by our advanced clinical practitioner who diagnosed him with early pneumonia and he was treated with antibiotics.  He was referred from his cardiologist's office with the infection treated appropriately and now has recovered.  His blood pressure is stable his heart rate is irregular but stable he is tolerating all his medications and generally feels well he had blood work done in June which showed Lipid profile and good renal function no additional blood work is indicated at this visit   Review of Systems  Constitutional: Negative for fever and fatigue.  HENT: Negative for hearing loss, congestion, neck pain and postnasal drip.   Eyes: Negative for discharge, redness and visual disturbance.  Respiratory: Negative for cough, shortness of breath and wheezing.   Cardiovascular: Negative for leg swelling.  Gastrointestinal: Negative for abdominal pain, constipation and abdominal distention.  Genitourinary: Negative for urgency and frequency.  Musculoskeletal: Negative for joint swelling and arthralgias.  Skin: Negative for color change and rash.  Neurological: Negative for weakness and light-headedness.  Hematological: Negative for adenopathy.  Psychiatric/Behavioral: Negative for behavioral problems.   Past Medical History  Diagnosis Date  . Actinic keratosis   . ALLERGIC RHINITIS   . Atrial fibrillation   . CAD, NATIVE VESSEL   . Cellulitis and abscess of trunk   . CHEST PAIN, ACUTE   . HYPERLIPIDEMIA   . HYPERTENSION   . OTH MALIG NEOPLASM SKIN OTH&UNSPEC PARTS FACE     "?from sunburns"  . OTH MALIG NEOPLASM SKIN OTH\T\UNSPEC PARTS FACE   . PACEMAKER, PERMANENT 2008  . Myocardial infarction   . Angina   . GERD (gastroesophageal reflux disease)       History   Social History  . Marital Status: Married    Spouse Name: N/A    Number of Children: N/A  . Years of Education: N/A   Occupational History  . Not on file.   Social History Main Topics  . Smoking status: Never Smoker   . Smokeless tobacco: Never Used  . Alcohol Use: Yes     05/14/11 "last occasional drink was in 1990's"  . Drug Use: No  . Sexually Active: Yes   Other Topics Concern  . Not on file   Social History Narrative  . No narrative on file    Past Surgical History  Procedure Date  . Cystectomy     from right side of face.  . Cataract extraction w/ intraocular lens  implant, bilateral 2012  . Back surgery 2000    "for bulging discs"  . Insert / replace / remove pacemaker 2008    with lead rvision and microperforation  . Coronary angioplasty with stent placement 2008    "3"  . Coronary angioplasty with stent placement 05/14/11    "1"    Family History  Problem Relation Age of Onset  . Colon cancer Other     No Known Allergies  Current Outpatient Prescriptions on File Prior to Visit  Medication Sig Dispense Refill  . aspirin 81 MG tablet Take 81 mg by mouth daily.        Marland Kitchen atorvastatin (LIPITOR) 20 MG tablet TAKE 1 TABLET ONCE DAILY  90 tablet  3  . diltiazem (MATZIM LA) 240 MG 24 hr tablet Take 1 tablet (  240 mg total) by mouth daily.  30 tablet  0  . fluticasone (FLONASE) 50 MCG/ACT nasal spray Place 2 sprays into the nose daily.        . meloxicam (MOBIC) 7.5 MG tablet Take 1 tablet (7.5 mg total) by mouth daily.      . metoprolol succinate (TOPROL-XL) 50 MG 24 hr tablet One and one half tablets once daily  135 tablet  4  . nitroGLYCERIN (NITROSTAT) 0.4 MG SL tablet Place 1 tablet (0.4 mg total) under the tongue every 5 (five) minutes as needed.  25 tablet  2  . omeprazole (PRILOSEC) 20 MG capsule Take 1 capsule (20 mg total) by mouth daily.      . Tamsulosin HCl (FLOMAX) 0.4 MG CAPS Take 1 capsule (0.4 mg total) by mouth daily.  90  capsule  3  . warfarin (COUMADIN) 2.5 MG tablet Take 1 tablet (2.5 mg total) by mouth daily.  50 tablet  11  . warfarin (COUMADIN) 5 MG tablet Take as directed        BP 118/74  Pulse 76  Temp 98.6 F (37 C)  Resp 16  Ht 5\' 8"  (1.727 m)  Wt 198 lb (89.812 kg)  BMI 30.11 kg/m2       Objective:   Physical Exam  Nursing note and vitals reviewed. Constitutional: He appears well-developed and well-nourished.  HENT:  Head: Normocephalic and atraumatic.  Mouth/Throat: Oropharynx is clear and moist.  Eyes: Conjunctivae normal are normal. Pupils are equal, round, and reactive to light.  Neck: Normal range of motion. Neck supple.  Cardiovascular: Normal rate and regular rhythm.   Pulmonary/Chest: Effort normal and breath sounds normal.  Abdominal: Soft. Bowel sounds are normal.          Assessment & Plan:  The patient has been in stable AF without evidence of CHF Pneumonia resolved HTN stable Follow up in 4 months

## 2012-03-28 ENCOUNTER — Encounter: Payer: Self-pay | Admitting: Cardiology

## 2012-04-01 ENCOUNTER — Ambulatory Visit (INDEPENDENT_AMBULATORY_CARE_PROVIDER_SITE_OTHER): Payer: MEDICARE | Admitting: *Deleted

## 2012-04-01 DIAGNOSIS — I4891 Unspecified atrial fibrillation: Secondary | ICD-10-CM

## 2012-04-01 LAB — POCT INR: INR: 3.1

## 2012-04-29 ENCOUNTER — Ambulatory Visit (INDEPENDENT_AMBULATORY_CARE_PROVIDER_SITE_OTHER): Payer: MEDICARE | Admitting: *Deleted

## 2012-04-29 DIAGNOSIS — I4891 Unspecified atrial fibrillation: Secondary | ICD-10-CM

## 2012-05-20 ENCOUNTER — Encounter: Payer: Self-pay | Admitting: Cardiology

## 2012-05-20 ENCOUNTER — Ambulatory Visit (INDEPENDENT_AMBULATORY_CARE_PROVIDER_SITE_OTHER): Payer: MEDICARE | Admitting: Pharmacist

## 2012-05-20 ENCOUNTER — Ambulatory Visit (INDEPENDENT_AMBULATORY_CARE_PROVIDER_SITE_OTHER): Payer: MEDICARE | Admitting: Cardiology

## 2012-05-20 VITALS — BP 129/80 | HR 86 | Ht 67.0 in | Wt 201.0 lb

## 2012-05-20 DIAGNOSIS — I4891 Unspecified atrial fibrillation: Secondary | ICD-10-CM

## 2012-05-20 DIAGNOSIS — Z950 Presence of cardiac pacemaker: Secondary | ICD-10-CM

## 2012-05-20 LAB — PACEMAKER DEVICE OBSERVATION
AL IMPEDENCE PM: 560 Ohm
ATRIAL PACING PM: 2.12
RV LEAD AMPLITUDE: 4.5 mv
RV LEAD IMPEDENCE PM: 472 Ohm
RV LEAD THRESHOLD: 1 V

## 2012-05-20 LAB — POCT INR: INR: 2.7

## 2012-05-20 MED ORDER — RIVAROXABAN 20 MG PO TABS: 20.0000 mg | ORAL_TABLET | Freq: Every day | ORAL | Status: DC

## 2012-05-20 NOTE — Progress Notes (Signed)
HPI The patient presents for yearly pacemaker follow up.  He is doing well. He did see Dr. Tedra Senegal earlier this year and was actually diagnosed with pneumonia. He's had no symptoms consistent with this and as below his exam is clear today. He's been active doing his usual work and he denies any new cardiovascular symptoms. He denies any chest pressure, neck or arm discomfort. He has no palpitations, presyncope or syncope. He has no PND or orthopnea. He does have some chronic lower extremity edema particularly when he is on his feet.    No Known Allergies  Current Outpatient Prescriptions  Medication Sig Dispense Refill  . atorvastatin (LIPITOR) 20 MG tablet TAKE 1 TABLET ONCE DAILY  90 tablet  3  . diltiazem (MATZIM LA) 240 MG 24 hr tablet Take 1 tablet (240 mg total) by mouth daily.  30 tablet  0  . fluticasone (FLONASE) 50 MCG/ACT nasal spray Place 2 sprays into the nose daily.        . meloxicam (MOBIC) 7.5 MG tablet Take 1 tablet (7.5 mg total) by mouth daily.      . metoprolol succinate (TOPROL-XL) 50 MG 24 hr tablet One and one half tablets once daily  135 tablet  4  . nitroGLYCERIN (NITROSTAT) 0.4 MG SL tablet Place 1 tablet (0.4 mg total) under the tongue every 5 (five) minutes as needed.  25 tablet  2  . omeprazole (PRILOSEC) 20 MG capsule Take 1 capsule (20 mg total) by mouth daily.      . Tamsulosin HCl (FLOMAX) 0.4 MG CAPS Take 1 capsule (0.4 mg total) by mouth daily.  90 capsule  3  . warfarin (COUMADIN) 2.5 MG tablet Take 1 tablet (2.5 mg total) by mouth daily.  50 tablet  11  . warfarin (COUMADIN) 5 MG tablet 5 mg. Take on Monday, Wednesday and Friday      . aspirin 81 MG tablet Take 81 mg by mouth daily.          Past Medical History  Diagnosis Date  . Actinic keratosis   . ALLERGIC RHINITIS   . Atrial fibrillation   . CAD, NATIVE VESSEL   . Cellulitis and abscess of trunk   . CHEST PAIN, ACUTE   . HYPERLIPIDEMIA   . HYPERTENSION   . OTH MALIG NEOPLASM SKIN  OTH&UNSPEC PARTS FACE     "?from sunburns"  . OTH MALIG NEOPLASM SKIN OTH\T\UNSPEC PARTS FACE   . PACEMAKER, PERMANENT 2008  . Myocardial infarction   . Angina   . GERD (gastroesophageal reflux disease)     Past Surgical History  Procedure Date  . Cystectomy     from right side of face.  . Cataract extraction w/ intraocular lens  implant, bilateral 2012  . Back surgery 2000    "for bulging discs"  . Insert / replace / remove pacemaker 2008    with lead rvision and microperforation  . Coronary angioplasty with stent placement 2008    "3"  . Coronary angioplasty with stent placement 05/14/11    "1"    ROS:  As stated in the HPI and negative for all other systems.  PHYSICAL EXAM BP 129/80  Pulse 86  Ht 5\' 7"  (1.702 m)  Wt 201 lb (91.173 kg)  BMI 31.48 kg/m2 GENERAL:  Well appearing HEENT:  Pupils equal round and reactive, fundi not visualized, oral mucosa unremarkable NECK:  No jugular venous distention, waveform within normal limits, carotid upstroke brisk and symmetric, no bruits,  no thyromegaly LYMPHATICS:  No cervical, inguinal adenopathy LUNGS:  Clear to auscultation bilaterally BACK:  No CVA tenderness CHEST:  Pacer scar HEART:  PMI not displaced or sustained,S1 and S2 within normal limits, no S3, no clicks, no rubs, no murmurs,  ABD:  Flat, positive bowel sounds normal in frequency in pitch, no bruits, no rebound, no guarding, no midline pulsatile mass, no hepatomegaly, no splenomegaly EXT:  2 plus pulses throughout, no edema, no cyanosis no clubbing SKIN:  No rashes no nodules NEURO:  Cranial nerves II through XII grossly intact, motor grossly intact throughout PSYCH:  Cognitively intact, oriented to person place and time   ASSESSMENT AND PLAN  ATRIAL FIBRILLATION -  The patient tolerates this rhythm and rate control and anticoagulation. He is interested in starting on a novel agents and I think this is reasonable. I will start him on Xarelto.  He has a  preserved renal function. He will be stopping his aspirin.  CAD, NATIVE VESSEL -  He had a stress perfusion study last year demonstrated no high risk findings area he has had no new symptoms since then. He will continue with risk reduction.   PACEMAKER, PERMANENT -  This was evaluated today and he has normal pacer function. He is permanently in atrial fibrillation but seems to have a controlled rate.

## 2012-05-20 NOTE — Patient Instructions (Addendum)
Please stop warfarin and asa. Start Xarelto 20 mg a day Continue all other medications as listed.  Follow up with Dr Antoine Poche.in July 2014.

## 2012-06-16 ENCOUNTER — Ambulatory Visit: Payer: MEDICARE | Admitting: Cardiology

## 2012-06-23 ENCOUNTER — Encounter: Payer: Self-pay | Admitting: Cardiology

## 2012-06-23 ENCOUNTER — Ambulatory Visit (INDEPENDENT_AMBULATORY_CARE_PROVIDER_SITE_OTHER): Payer: MEDICARE | Admitting: Cardiology

## 2012-06-23 VITALS — BP 119/68 | HR 62 | Ht 69.0 in | Wt 202.0 lb

## 2012-06-23 DIAGNOSIS — I4891 Unspecified atrial fibrillation: Secondary | ICD-10-CM

## 2012-06-23 DIAGNOSIS — Z950 Presence of cardiac pacemaker: Secondary | ICD-10-CM

## 2012-06-23 DIAGNOSIS — I2510 Atherosclerotic heart disease of native coronary artery without angina pectoris: Secondary | ICD-10-CM

## 2012-06-23 NOTE — Patient Instructions (Addendum)
Please call the office and let us know if you are taking meloxicam.  Your physician recommends that you schedule a follow-up appointment in: [facility] 2014

## 2012-06-23 NOTE — Progress Notes (Addendum)
HPI:  This patient is in for followup. He continues to do well. A pacer followup with Dr. Antoine Poche, and everything was in order. He remains in permanent atrial fibrillation. He also just over a year ago had to undergo percutaneous coronary intervention of the RCA with non DES.  Has moderate LAD disease.  Stable otherwise.  He also tells me that his knees are not so bad---we discuss the use of Meloxicam in detail, including risks.    Current Outpatient Prescriptions  Medication Sig Dispense Refill  . aspirin EC 81 MG tablet Take 81 mg by mouth daily.      Marland Kitchen atorvastatin (LIPITOR) 20 MG tablet TAKE 1 TABLET ONCE DAILY  90 tablet  3  . diltiazem (MATZIM LA) 240 MG 24 hr tablet Take 1 tablet (240 mg total) by mouth daily.  30 tablet  0  . fluticasone (FLONASE) 50 MCG/ACT nasal spray Place 2 sprays into the nose daily.        . meloxicam (MOBIC) 7.5 MG tablet Take 1 tablet (7.5 mg total) by mouth daily.      . metoprolol succinate (TOPROL-XL) 50 MG 24 hr tablet One and one half tablets once daily  135 tablet  4  . nitroGLYCERIN (NITROSTAT) 0.4 MG SL tablet Place 1 tablet (0.4 mg total) under the tongue every 5 (five) minutes as needed.  25 tablet  2  . omeprazole (PRILOSEC) 20 MG capsule Take 1 capsule (20 mg total) by mouth daily.      . Tamsulosin HCl (FLOMAX) 0.4 MG CAPS Take 1 capsule (0.4 mg total) by mouth daily.  90 capsule  3  . warfarin (COUMADIN) 5 MG tablet Take 5 mg by mouth daily. As directed by anticoagulation clinic        No Known Allergies  Past Medical History  Diagnosis Date  . Actinic keratosis   . ALLERGIC RHINITIS   . Atrial fibrillation   . CAD, NATIVE VESSEL   . Cellulitis and abscess of trunk   . CHEST PAIN, ACUTE   . HYPERLIPIDEMIA   . HYPERTENSION   . OTH MALIG NEOPLASM SKIN OTH&UNSPEC PARTS FACE     "?from sunburns"  . OTH MALIG NEOPLASM SKIN OTH\T\UNSPEC PARTS FACE   . PACEMAKER, PERMANENT 2008  . Myocardial infarction   . Angina   . GERD  (gastroesophageal reflux disease)     Past Surgical History  Procedure Date  . Cystectomy     from right side of face.  . Cataract extraction w/ intraocular lens  implant, bilateral 2012  . Back surgery 2000    "for bulging discs"  . Insert / replace / remove pacemaker 2008    with lead rvision and microperforation  . Coronary angioplasty with stent placement 2008    "3"  . Coronary angioplasty with stent placement 05/14/11    "1"    Family History  Problem Relation Age of Onset  . Colon cancer Other     History   Social History  . Marital Status: Married    Spouse Name: N/A    Number of Children: N/A  . Years of Education: N/A   Occupational History  . Not on file.   Social History Main Topics  . Smoking status: Never Smoker   . Smokeless tobacco: Never Used  . Alcohol Use: Yes     Comment: 05/14/11 "last occasional drink was in 1990's"  . Drug Use: No  . Sexually Active: Yes   Other Topics Concern  .  Not on file   Social History Narrative  . No narrative on file    ROS: Please see the HPI.  All other systems reviewed and negative.  PHYSICAL EXAM:  BP 119/68  Pulse 62  Ht 5\' 9"  (1.753 m)  Wt 202 lb (91.627 kg)  BMI 29.83 kg/m2  SpO2 97%  General: Well developed, well nourished, in no acute distress. Head:  Normocephalic and atraumatic. Neck: no JVD Lungs: Clear to auscultation and percussion. Heart: Normal S1 and S2.  No murmur, rubs or gallops.  Abdomen:  Normal bowel sounds; soft; non tender; no organomegaly Pulses: Pulses normal in all 4 extremities. Extremities: No clubbing or cyanosis. No edema. Neurologic: Alert and oriented x 3.  [id number] fib with ventricular pacing.   ASSESSMENT AND PLAN:  He will hold his Meloxicam to see how he does.  Risk of bleeding and adverse effects discussed.

## 2012-06-23 NOTE — Assessment & Plan Note (Signed)
Sees [facility].

## 2012-06-23 NOTE — Assessment & Plan Note (Signed)
Remains in permanent atrial fib.  Went to get his Xarelto filled and his insurance would not cover it.  He is checking with his insurer about other names.

## 2012-06-23 NOTE — Assessment & Plan Note (Signed)
He continues to remain asymptomatic.  He will need long term follow up with Dr. Antoine Poche.

## 2012-06-24 ENCOUNTER — Telehealth: Payer: Self-pay | Admitting: Cardiology

## 2012-06-24 NOTE — Telephone Encounter (Signed)
New problem:   Was told to call back with information.    Patient is not taking  Meloxicam.

## 2012-06-24 NOTE — Telephone Encounter (Signed)
I spoke with the pt's wife and confirmed that the pt is not taking meloxicam. I will update the pt's medication list.

## 2012-07-21 ENCOUNTER — Ambulatory Visit (INDEPENDENT_AMBULATORY_CARE_PROVIDER_SITE_OTHER): Payer: MEDICARE | Admitting: Internal Medicine

## 2012-07-21 ENCOUNTER — Encounter: Payer: Self-pay | Admitting: Internal Medicine

## 2012-07-21 VITALS — BP 130/74 | HR 72 | Temp 98.1°F | Resp 16 | Ht 69.0 in | Wt 206.0 lb

## 2012-07-21 DIAGNOSIS — I10 Essential (primary) hypertension: Secondary | ICD-10-CM

## 2012-07-21 DIAGNOSIS — L570 Actinic keratosis: Secondary | ICD-10-CM

## 2012-07-21 DIAGNOSIS — I4891 Unspecified atrial fibrillation: Secondary | ICD-10-CM

## 2012-07-21 DIAGNOSIS — E785 Hyperlipidemia, unspecified: Secondary | ICD-10-CM

## 2012-07-21 DIAGNOSIS — K219 Gastro-esophageal reflux disease without esophagitis: Secondary | ICD-10-CM

## 2012-07-21 DIAGNOSIS — Z85828 Personal history of other malignant neoplasm of skin: Secondary | ICD-10-CM

## 2012-07-21 LAB — BASIC METABOLIC PANEL
BUN: 15 mg/dL (ref 6–23)
CO2: 29 mEq/L (ref 19–32)
Chloride: 107 mEq/L (ref 96–112)
GFR: 73.9 mL/min (ref >60.00)
Glucose, Bld: 121 mg/dL — ABNORMAL HIGH (ref 70–99)
Potassium: 4.7 mEq/L (ref 3.5–5.1)
Sodium: 142 mEq/L (ref 135–145)

## 2012-07-21 LAB — HEPATIC FUNCTION PANEL
ALT: 31 U/L (ref 0–53)
AST: 22 U/L (ref 0–37)
Albumin: 3.9 g/dL (ref 3.5–5.2)
Alkaline Phosphatase: 57 U/L (ref 39–117)
Total Protein: 7.3 g/dL (ref 6.0–8.3)

## 2012-07-21 LAB — LIPID PANEL: Cholesterol: 108 mg/dL (ref 0–200)

## 2012-07-21 MED ORDER — DILTIAZEM HCL ER COATED BEADS 240 MG PO TB24: 240.0000 mg | ORAL_TABLET | Freq: Every day | ORAL | Status: DC

## 2012-07-21 MED ORDER — ATORVASTATIN CALCIUM 20 MG PO TABS: 20.0000 mg | ORAL_TABLET | Freq: Every day | ORAL | Status: DC

## 2012-07-21 MED ORDER — TAMSULOSIN HCL 0.4 MG PO CAPS: 0.4000 mg | ORAL_CAPSULE | Freq: Every day | ORAL | Status: DC

## 2012-07-21 NOTE — Addendum Note (Signed)
Addended by: Willy Eddy on: 07/21/2012 09:26 AM   Modules accepted: Orders

## 2012-07-21 NOTE — Patient Instructions (Addendum)
Lotrimin af or ultram cream OTC  Anticoagulation Dose Instructions as of 07/21/2012     Shawn [name] Tue Wed Thu Fri Sat   New Dose 2.5 mg 5 mg 2.5 mg 5 mg 2.5 mg 5 mg 2.5 mg    Description       Continue same dose 1 pill everyday except 2 pills on Mondays, Wednesdays and Fridays.  Recheck INR in 4 weeks.

## 2012-07-21 NOTE — Addendum Note (Signed)
Addended by: Rita Ohara R on: 07/21/2012 09:15 AM   Modules accepted: Orders

## 2012-07-21 NOTE — Progress Notes (Signed)
Subjective:    Patient ID: Shawn [name], male    DOB: [birth year], 78 y.o.   MRN: [id number]  HPI  Has recurrent skin changes on face that could be AK or early BCCA? Blood pressure good Dr. [place] checked pacer and discussed changing the coumadin to zaralto but it was too expensive and he did not change. Has not had protime since that visit.  Review of Systems  Constitutional: Negative for fever and fatigue.  HENT: Negative for hearing loss, congestion, neck pain and postnasal drip.   Eyes: Negative for discharge, redness and visual disturbance.  Respiratory: Positive for shortness of breath. Negative for cough and wheezing.   Cardiovascular: Negative for leg swelling.  Gastrointestinal: Negative for abdominal pain, constipation and abdominal distention.  Genitourinary: Negative for urgency and frequency.  Musculoskeletal: Negative for joint swelling and arthralgias.  Skin: Negative for color change and rash.  Neurological: Negative for weakness and light-headedness.  Hematological: Negative for adenopathy. Bruises/bleeds easily.  Psychiatric/Behavioral: Negative for behavioral problems.   Past Medical History  Diagnosis Date  . Actinic keratosis   . ALLERGIC RHINITIS   . Atrial fibrillation   . CAD, NATIVE VESSEL   . Cellulitis and abscess of trunk   . CHEST PAIN, ACUTE   . HYPERLIPIDEMIA   . HYPERTENSION   . OTH MALIG NEOPLASM SKIN OTH&UNSPEC PARTS FACE     "?from sunburns"  . OTH MALIG NEOPLASM SKIN OTH\T\UNSPEC PARTS FACE   . PACEMAKER, PERMANENT 2008  . Myocardial infarction   . Angina   . GERD (gastroesophageal reflux disease)     History   Social History  . Marital Status: Married    Spouse Name: N/A    Number of Children: N/A  . Years of Education: N/A   Occupational History  . Not on file.   Social History Main Topics  . Smoking status: Never Smoker   . Smokeless tobacco: Never Used  . Alcohol Use: Yes     Comment: 05/14/11 "last occasional  drink was in 1990's"  . Drug Use: No  . Sexually Active: Yes   Other Topics Concern  . Not on file   Social History Narrative  . No narrative on file    Past Surgical History  Procedure Laterality Date  . Cystectomy      from right side of face.  . Cataract extraction w/ intraocular lens  implant, bilateral  2012  . Back surgery  2000    "for bulging discs"  . Insert / replace / remove pacemaker  2008    with lead rvision and microperforation  . Coronary angioplasty with stent placement  2008    "3"  . Coronary angioplasty with stent placement  05/14/11    "1"    Family History  Problem Relation Age of Onset  . Colon cancer Other     No Known Allergies  Current Outpatient Prescriptions on File Prior to Visit  Medication Sig Dispense Refill  . aspirin EC 81 MG tablet Take 81 mg by mouth daily.      Marland Kitchen atorvastatin (LIPITOR) 20 MG tablet TAKE 1 TABLET ONCE DAILY  90 tablet  3  . diltiazem (MATZIM LA) 240 MG 24 hr tablet Take 1 tablet (240 mg total) by mouth daily.  30 tablet  0  . fluticasone (FLONASE) 50 MCG/ACT nasal spray Place 2 sprays into the nose daily.        . metoprolol succinate (TOPROL-XL) 50 MG 24 hr tablet One and one half  tablets once daily  135 tablet  4  . nitroGLYCERIN (NITROSTAT) 0.4 MG SL tablet Place 1 tablet (0.4 mg total) under the tongue every 5 (five) minutes as needed.  25 tablet  2  . omeprazole (PRILOSEC) 20 MG capsule Take 1 capsule (20 mg total) by mouth daily.      . Tamsulosin HCl (FLOMAX) 0.4 MG CAPS Take 1 capsule (0.4 mg total) by mouth daily.  90 capsule  3  . warfarin (COUMADIN) 5 MG tablet Take 5 mg by mouth daily. As directed by anticoagulation clinic       No current facility-administered medications on file prior to visit.    BP 130/74  Pulse 72  Temp(Src) 98.1 F (36.7 C)  Resp 16  Ht 5\' 9"  (1.753 m)  Wt 206 lb (93.441 kg)  BMI 30.41 kg/m2        Objective:   Physical Exam  Nursing note and vitals  reviewed. Constitutional: He appears well-developed and well-nourished.  HENT:  Head: Normocephalic and atraumatic.  Eyes: Conjunctivae are normal. Pupils are equal, round, and reactive to light.  Neck: Normal range of motion. Neck supple.  Cardiovascular:  Murmur heard. irregular rate  Pulmonary/Chest: Effort normal and breath sounds normal.  Abdominal: Soft. Bowel sounds are normal.  Skin: Rash noted.  Date of actinic keratosis above the right eye and on the right ear          Assessment & Plan:   Informed consent was obtained and the lesions were treated for 60 seconds of liquid nitrogen application the patient tolerated the procedure well as procedural care was discussed with the patient and instructions should the lesion reappears contact our office immediately  2 lesions were treated one lesion over the right eye 1 lesion on the right ear  Stable blood pressure Pro time needs to be checked today Lipid and liver will be monitored today Basic metabolic panel will be monitored today Stay on coumadin until discusses costs with cardiology

## 2012-08-21 ENCOUNTER — Ambulatory Visit (INDEPENDENT_AMBULATORY_CARE_PROVIDER_SITE_OTHER): Payer: MEDICARE | Admitting: Pharmacist

## 2012-08-21 ENCOUNTER — Other Ambulatory Visit: Payer: Self-pay

## 2012-08-21 ENCOUNTER — Other Ambulatory Visit: Payer: Self-pay | Admitting: Cardiology

## 2012-08-21 ENCOUNTER — Encounter: Payer: Self-pay | Admitting: Cardiology

## 2012-08-21 ENCOUNTER — Ambulatory Visit (INDEPENDENT_AMBULATORY_CARE_PROVIDER_SITE_OTHER): Payer: MEDICARE | Admitting: Cardiology

## 2012-08-21 VITALS — BP 124/74 | HR 72 | Ht 67.0 in | Wt 206.0 lb

## 2012-08-21 DIAGNOSIS — I2510 Atherosclerotic heart disease of native coronary artery without angina pectoris: Secondary | ICD-10-CM

## 2012-08-21 DIAGNOSIS — I4891 Unspecified atrial fibrillation: Secondary | ICD-10-CM

## 2012-08-21 DIAGNOSIS — I10 Essential (primary) hypertension: Secondary | ICD-10-CM

## 2012-08-21 DIAGNOSIS — E785 Hyperlipidemia, unspecified: Secondary | ICD-10-CM

## 2012-08-21 MED ORDER — WARFARIN SODIUM 2.5 MG PO TABS: ORAL_TABLET | ORAL | Status: DC

## 2012-08-21 NOTE — Patient Instructions (Addendum)
INR today--we will try to transition INR monitoring to PCP office  Your physician wants you to follow-up in:  6 MONTHS with Dr Antoine Poche (previous pt of Dr Riley Kill). You will receive a reminder letter in the mail two months in advance. If you don't receive a letter, please call our office to schedule the follow-up appointment.  Your physician recommends that you continue on your current medications as directed. Please refer to the Current Medication list given to you today.

## 2012-08-21 NOTE — Progress Notes (Signed)
HPI:  The patient is seen today for a follow up visit.  Overall he remains stable.  We discussed anticoagulation options in detail. No current chest pain.  He's not having a chest pain or other major cardiac symptoms at the present time. We have arranged followup with Dr. Antoine Poche on long-term basis  Current Outpatient Prescriptions  Medication Sig Dispense Refill  . aspirin EC 81 MG tablet Take 81 mg by mouth daily.      Marland Kitchen atorvastatin (LIPITOR) 20 MG tablet Take 1 tablet (20 mg total) by mouth daily.  90 tablet  3  . diltiazem (MATZIM LA) 240 MG 24 hr tablet Take 1 tablet (240 mg total) by mouth daily.  90 tablet  3  . fluticasone (FLONASE) 50 MCG/ACT nasal spray Place 2 sprays into the nose daily.        . metoprolol succinate (TOPROL-XL) 50 MG 24 hr tablet One and one half tablets once daily  135 tablet  4  . nitroGLYCERIN (NITROSTAT) 0.4 MG SL tablet Place 1 tablet (0.4 mg total) under the tongue every 5 (five) minutes as needed.  25 tablet  2  . omeprazole (PRILOSEC) 20 MG capsule Take 1 capsule (20 mg total) by mouth daily.      . Tamsulosin HCl (FLOMAX) 0.4 MG CAPS Take 1 capsule (0.4 mg total) by mouth daily.  90 capsule  3  . warfarin (COUMADIN) 5 MG tablet Take 5 mg by mouth daily. As directed by anticoagulation clinic       No current facility-administered medications for this visit.    No Known Allergies  Past Medical History  Diagnosis Date  . Actinic keratosis   . ALLERGIC RHINITIS   . Atrial fibrillation   . CAD, NATIVE VESSEL   . Cellulitis and abscess of trunk   . CHEST PAIN, ACUTE   . HYPERLIPIDEMIA   . HYPERTENSION   . OTH MALIG NEOPLASM SKIN OTH&UNSPEC PARTS FACE     "?from sunburns"  . OTH MALIG NEOPLASM SKIN OTH\T\UNSPEC PARTS FACE   . PACEMAKER, PERMANENT 2008  . Myocardial infarction   . Angina   . GERD (gastroesophageal reflux disease)     Past Surgical History  Procedure Laterality Date  . Cystectomy      from right side of face.  . Cataract  extraction w/ intraocular lens  implant, bilateral  2012  . Back surgery  2000    "for bulging discs"  . Insert / replace / remove pacemaker  2008    with lead rvision and microperforation  . Coronary angioplasty with stent placement  2008    "3"  . Coronary angioplasty with stent placement  05/14/11    "1"    Family History  Problem Relation Age of Onset  . Colon cancer Other     History   Social History  . Marital Status: Married    Spouse Name: N/A    Number of Children: N/A  . Years of Education: N/A   Occupational History  . Not on file.   Social History Main Topics  . Smoking status: Never Smoker   . Smokeless tobacco: Never Used  . Alcohol Use: Yes     Comment: 05/14/11 "last occasional drink was in 1990's"  . Drug Use: No  . Sexually Active: Yes   Other Topics Concern  . Not on file   Social History Narrative  . No narrative on file    ROS: Please see the HPI.  All other  systems reviewed and negative.  PHYSICAL EXAM:  BP 124/74  Pulse 72  Ht 5\' 7"  (1.702 m)  Wt 206 lb (93.441 kg)  BMI 32.26 kg/m2  General: Well developed, well nourished, in no acute distress. Head:  Normocephalic and atraumatic. Neck: no JVD.  Pacer site looks good.   Lungs: Clear to auscultation and percussion. Heart: Normal S1 and S2.  No murmur, rubs or gallops.  Abdomen:  Normal bowel sounds; soft; non tender; no organomegaly Pulses: Pulses normal in all 4 extremities. Extremities: No clubbing or cyanosis. No edema. Neurologic: Alert and oriented x 3.  EKG:  Atrial fib.  V pacing.  NO acute ST and T changes.  ASSESSMENT AND PLAN:  1.  CAD sp PCI 2. SP perm pacer 3.  Pers atrial fib. 4.  Hyperlipidemia.  5.  Chronic warfarin therapy

## 2012-08-24 NOTE — Assessment & Plan Note (Signed)
Background AF on warfarin therapy.  Continue current regimen.

## 2012-08-24 NOTE — Assessment & Plan Note (Signed)
On target with treatment.

## 2012-08-24 NOTE — Assessment & Plan Note (Signed)
Very stable since last PCI.

## 2012-08-24 NOTE — Assessment & Plan Note (Signed)
Stable at present.   

## 2012-08-25 ENCOUNTER — Ambulatory Visit (INDEPENDENT_AMBULATORY_CARE_PROVIDER_SITE_OTHER): Payer: MEDICARE | Admitting: *Deleted

## 2012-08-25 ENCOUNTER — Other Ambulatory Visit: Payer: Self-pay | Admitting: Cardiology

## 2012-08-25 ENCOUNTER — Telehealth: Payer: Self-pay | Admitting: Cardiology

## 2012-08-25 DIAGNOSIS — I4891 Unspecified atrial fibrillation: Secondary | ICD-10-CM

## 2012-08-25 DIAGNOSIS — Z950 Presence of cardiac pacemaker: Secondary | ICD-10-CM

## 2012-08-25 NOTE — Telephone Encounter (Signed)
Transmission did not go thru. Wife was instructed for pt to send transmission again.

## 2012-08-25 NOTE — Telephone Encounter (Signed)
New problem    Pt wants to know if remote call for pace maker went through

## 2012-09-03 LAB — REMOTE PACEMAKER DEVICE
BATTERY VOLTAGE: 2.93 V
BRDY-0004RV: 130 {beats}/min
VENTRICULAR PACING PM: 43.41

## 2012-09-18 ENCOUNTER — Ambulatory Visit (INDEPENDENT_AMBULATORY_CARE_PROVIDER_SITE_OTHER): Payer: MEDICARE | Admitting: Family

## 2012-09-18 DIAGNOSIS — I4891 Unspecified atrial fibrillation: Secondary | ICD-10-CM

## 2012-09-18 NOTE — Patient Instructions (Addendum)
Continue same dose 1 pill everyday except 2 pills on Mondays, Wednesdays and Fridays.  Recheck INR in 6 weeks   Anticoagulation Dose Instructions as of 09/18/2012     Shawn [name] Tue Wed Thu Fri Sat   New Dose 2.5 mg 5 mg 2.5 mg 5 mg 2.5 mg 5 mg 2.5 mg    Description       Continue same dose 1 pill everyday except 2 pills on Mondays, Wednesdays and Fridays.  Recheck INR in 6 weeks

## 2012-10-15 ENCOUNTER — Other Ambulatory Visit: Payer: Self-pay

## 2012-10-15 MED ORDER — WARFARIN SODIUM 2.5 MG PO TABS: ORAL_TABLET | ORAL | Status: DC

## 2012-10-21 ENCOUNTER — Encounter: Payer: MEDICARE | Admitting: Family

## 2012-10-22 ENCOUNTER — Ambulatory Visit (INDEPENDENT_AMBULATORY_CARE_PROVIDER_SITE_OTHER): Payer: MEDICARE | Admitting: Family

## 2012-10-22 DIAGNOSIS — I4891 Unspecified atrial fibrillation: Secondary | ICD-10-CM

## 2012-10-22 NOTE — Patient Instructions (Signed)
Hold Coumadin Thursday only. Continue same dose 1 pill everyday except 2 pills on Mondays, Wednesdays and Fridays.  Recheck INR in 4 weeks  Anticoagulation Dose Instructions as of 10/22/2012     Shawn [name] Tue Wed Thu Fri Sat   New Dose 2.5 mg 5 mg 2.5 mg 5 mg 2.5 mg 5 mg 2.5 mg    Description       Hold Coumadin Thursday only. Continue same dose 1 pill everyday except 2 pills on Mondays, Wednesdays and Fridays.  Recheck INR in 4 weeks

## 2012-10-30 ENCOUNTER — Encounter: Payer: MEDICARE | Admitting: Family

## 2012-11-13 ENCOUNTER — Other Ambulatory Visit: Payer: Self-pay

## 2012-11-13 MED ORDER — METOPROLOL SUCCINATE ER 50 MG PO TB24: ORAL_TABLET | ORAL | Status: DC

## 2012-11-18 ENCOUNTER — Ambulatory Visit (INDEPENDENT_AMBULATORY_CARE_PROVIDER_SITE_OTHER): Payer: MEDICARE | Admitting: Family

## 2012-11-18 DIAGNOSIS — I4891 Unspecified atrial fibrillation: Secondary | ICD-10-CM

## 2012-11-18 LAB — POCT INR: INR: 3.2

## 2012-11-18 NOTE — Patient Instructions (Addendum)
Continue same dose 1 pill everyday except 2 pills on Mondays and Fridays.  Recheck INR in 4 weeks  Anticoagulation Dose Instructions as of 11/18/2012     Shawn [name] Tue Wed Thu Fri Sat   New Dose 2.5 mg 5 mg 2.5 mg 2.5 mg 2.5 mg 5 mg 2.5 mg    Description       Continue same dose 1 pill everyday except 2 pills on Mondays and Fridays.  Recheck INR in 4 weeks

## 2012-11-19 ENCOUNTER — Ambulatory Visit (INDEPENDENT_AMBULATORY_CARE_PROVIDER_SITE_OTHER): Payer: MEDICARE | Admitting: Internal Medicine

## 2012-11-19 ENCOUNTER — Encounter: Payer: Self-pay | Admitting: Internal Medicine

## 2012-11-19 VITALS — BP 120/72 | HR 72 | Temp 98.0°F | Resp 18 | Ht 67.0 in | Wt 204.0 lb

## 2012-11-19 DIAGNOSIS — I10 Essential (primary) hypertension: Secondary | ICD-10-CM

## 2012-11-19 DIAGNOSIS — I4891 Unspecified atrial fibrillation: Secondary | ICD-10-CM

## 2012-11-19 DIAGNOSIS — E785 Hyperlipidemia, unspecified: Secondary | ICD-10-CM

## 2012-11-19 DIAGNOSIS — K219 Gastro-esophageal reflux disease without esophagitis: Secondary | ICD-10-CM

## 2012-11-19 NOTE — Progress Notes (Signed)
Subjective:    Patient ID: Shawn [name], male    DOB: [birth year], 78 y.o.   MRN: [id number]  HPI Controlled heart burn  Afib and stable coumadin 3.2 IN and helf one pill No dark stools  Monitor for CBC and lipid  Next visit for medicare wellness CPX   Review of Systems  Constitutional: Negative for fever and fatigue.  HENT: Negative for hearing loss, congestion, neck pain and postnasal drip.   Eyes: Negative for discharge, redness and visual disturbance.  Respiratory: Negative for cough, shortness of breath and wheezing.   Cardiovascular: Negative for leg swelling.  Gastrointestinal: Negative for abdominal pain, constipation and abdominal distention.  Genitourinary: Negative for urgency and frequency.  Musculoskeletal: Negative for joint swelling and arthralgias.  Skin: Negative for color change and rash.  Neurological: Negative for weakness and light-headedness.  Hematological: Negative for adenopathy.  Psychiatric/Behavioral: Negative for behavioral problems.   Past Medical History  Diagnosis Date  . Actinic keratosis   . ALLERGIC RHINITIS   . Atrial fibrillation   . CAD, NATIVE VESSEL   . Cellulitis and abscess of trunk   . CHEST PAIN, ACUTE   . HYPERLIPIDEMIA   . HYPERTENSION   . OTH MALIG NEOPLASM SKIN OTH&UNSPEC PARTS FACE     "?from sunburns"  . OTH MALIG NEOPLASM SKIN OTH\T\UNSPEC PARTS FACE   . PACEMAKER, PERMANENT 2008  . Myocardial infarction   . Angina   . GERD (gastroesophageal reflux disease)     History   Social History  . Marital Status: Married    Spouse Name: N/A    Number of Children: N/A  . Years of Education: N/A   Occupational History  . Not on file.   Social History Main Topics  . Smoking status: Never Smoker   . Smokeless tobacco: Never Used  . Alcohol Use: Yes     Comment: 05/14/11 "last occasional drink was in 1990's"  . Drug Use: No  . Sexually Active: Yes   Other Topics Concern  . Not on file   Social History Narrative   . No narrative on file    Past Surgical History  Procedure Laterality Date  . Cystectomy      from right side of face.  . Cataract extraction w/ intraocular lens  implant, bilateral  2012  . Back surgery  2000    "for bulging discs"  . Insert / replace / remove pacemaker  2008    with lead rvision and microperforation  . Coronary angioplasty with stent placement  2008    "3"  . Coronary angioplasty with stent placement  05/14/11    "1"    Family History  Problem Relation Age of Onset  . Colon cancer Other     No Known Allergies  Current Outpatient Prescriptions on File Prior to Visit  Medication Sig Dispense Refill  . aspirin EC 81 MG tablet Take 81 mg by mouth daily.      Marland Kitchen atorvastatin (LIPITOR) 20 MG tablet Take 1 tablet (20 mg total) by mouth daily.  90 tablet  3  . diltiazem (MATZIM LA) 240 MG 24 hr tablet Take 1 tablet (240 mg total) by mouth daily.  90 tablet  3  . fluticasone (FLONASE) 50 MCG/ACT nasal spray Place 2 sprays into the nose daily.        . metoprolol succinate (TOPROL-XL) 50 MG 24 hr tablet One and one half tablets once daily  135 tablet  4  . nitroGLYCERIN (NITROSTAT) 0.4 MG  SL tablet Place 1 tablet (0.4 mg total) under the tongue every 5 (five) minutes as needed.  25 tablet  2  . omeprazole (PRILOSEC) 20 MG capsule Take 1 capsule (20 mg total) by mouth daily.      . Tamsulosin HCl (FLOMAX) 0.4 MG CAPS Take 1 capsule (0.4 mg total) by mouth daily.  90 capsule  3  . warfarin (COUMADIN) 2.5 MG tablet Take as directed by the Coumadin Clinic  40 tablet  2   No current facility-administered medications on file prior to visit.    BP 120/72  Pulse 72  Temp(Src) 98 F (36.7 C)  Resp 18  Ht 5\' 7"  (1.702 m)  Wt 204 lb (92.534 kg)  BMI 31.94 kg/m2       Objective:   Physical Exam  Constitutional: He appears well-developed and well-nourished.  HENT:  Head: Normocephalic and atraumatic.  Eyes: Conjunctivae are normal. Pupils are equal, round, and  reactive to light.  Neck: Normal range of motion. Neck supple.  Cardiovascular:  Murmur heard. Irregular rythm  Pulmonary/Chest: Effort normal and breath sounds normal.  Abdominal: Soft. Bowel sounds are normal.          Assessment & Plan:  Stable HTN Allergic rhinitis... Use the flonase in AM Stable prostate Stable gerd

## 2012-11-19 NOTE — Patient Instructions (Addendum)
The patient is instructed to continue all medications as prescribed. Schedule followup with check out clerk upon leaving the clinic  

## 2012-12-01 ENCOUNTER — Ambulatory Visit (INDEPENDENT_AMBULATORY_CARE_PROVIDER_SITE_OTHER): Payer: MEDICARE | Admitting: *Deleted

## 2012-12-01 DIAGNOSIS — Z950 Presence of cardiac pacemaker: Secondary | ICD-10-CM

## 2012-12-01 DIAGNOSIS — I4891 Unspecified atrial fibrillation: Secondary | ICD-10-CM

## 2012-12-02 LAB — REMOTE PACEMAKER DEVICE
AL IMPEDENCE PM: 504 Ohm
ATRIAL PACING PM: 2.23
BATTERY VOLTAGE: 2.9 V
RV LEAD IMPEDENCE PM: 440 Ohm
VENTRICULAR PACING PM: 52.85

## 2012-12-15 ENCOUNTER — Other Ambulatory Visit: Payer: Self-pay | Admitting: Internal Medicine

## 2012-12-23 ENCOUNTER — Encounter: Payer: MEDICARE | Admitting: Family

## 2012-12-25 ENCOUNTER — Ambulatory Visit (INDEPENDENT_AMBULATORY_CARE_PROVIDER_SITE_OTHER): Payer: MEDICARE | Admitting: General Practice

## 2012-12-25 ENCOUNTER — Ambulatory Visit: Payer: MEDICARE

## 2012-12-25 DIAGNOSIS — I4891 Unspecified atrial fibrillation: Secondary | ICD-10-CM

## 2012-12-25 LAB — POCT INR: INR: 2.3

## 2013-01-13 ENCOUNTER — Telehealth: Payer: Self-pay | Admitting: Internal Medicine

## 2013-01-13 MED ORDER — WARFARIN SODIUM 2.5 MG PO TABS: ORAL_TABLET | ORAL | Status: DC

## 2013-01-13 NOTE — Telephone Encounter (Signed)
PT is calling to request a refill of his warfarin (COUMADIN) 2.5 MG tablet sent to [facility]. Please assist. (PT states he is completely out)

## 2013-01-13 NOTE — Telephone Encounter (Signed)
Rx sent 

## 2013-01-22 ENCOUNTER — Ambulatory Visit (INDEPENDENT_AMBULATORY_CARE_PROVIDER_SITE_OTHER): Payer: MEDICARE | Admitting: General Practice

## 2013-01-22 DIAGNOSIS — Z7901 Long term (current) use of anticoagulants: Secondary | ICD-10-CM

## 2013-01-22 DIAGNOSIS — I4891 Unspecified atrial fibrillation: Secondary | ICD-10-CM

## 2013-01-22 LAB — POCT INR: INR: 1.9

## 2013-01-23 ENCOUNTER — Other Ambulatory Visit: Payer: Self-pay | Admitting: *Deleted

## 2013-02-05 ENCOUNTER — Telehealth: Payer: Self-pay | Admitting: Internal Medicine

## 2013-02-05 MED ORDER — ATORVASTATIN CALCIUM 20 MG PO TABS: ORAL_TABLET | ORAL | Status: DC

## 2013-02-05 NOTE — Telephone Encounter (Signed)
Rx sent to pharmacy   

## 2013-02-05 NOTE — Telephone Encounter (Signed)
Patient is out of atorvastatin and will not receive his mail order rx until Fri or Sat. He is requesting we send in a rx for 2 pills to [facility]. Please advise.

## 2013-02-17 ENCOUNTER — Ambulatory Visit: Payer: MEDICARE | Admitting: Cardiology

## 2013-03-02 ENCOUNTER — Ambulatory Visit (INDEPENDENT_AMBULATORY_CARE_PROVIDER_SITE_OTHER): Payer: MEDICARE | Admitting: General Practice

## 2013-03-02 DIAGNOSIS — I4891 Unspecified atrial fibrillation: Secondary | ICD-10-CM

## 2013-03-02 LAB — POCT INR: INR: 2.7

## 2013-03-05 ENCOUNTER — Ambulatory Visit (INDEPENDENT_AMBULATORY_CARE_PROVIDER_SITE_OTHER): Payer: MEDICARE | Admitting: Cardiology

## 2013-03-05 ENCOUNTER — Encounter: Payer: Self-pay | Admitting: Cardiology

## 2013-03-05 VITALS — BP 138/70 | HR 72 | Ht 67.0 in | Wt 202.0 lb

## 2013-03-05 DIAGNOSIS — I10 Essential (primary) hypertension: Secondary | ICD-10-CM

## 2013-03-05 DIAGNOSIS — I2510 Atherosclerotic heart disease of native coronary artery without angina pectoris: Secondary | ICD-10-CM

## 2013-03-05 DIAGNOSIS — I4891 Unspecified atrial fibrillation: Secondary | ICD-10-CM

## 2013-03-05 MED ORDER — NITROGLYCERIN 0.4 MG SL SUBL: 0.4000 mg | SUBLINGUAL_TABLET | SUBLINGUAL | Status: DC | PRN

## 2013-03-05 MED ORDER — COLCHICINE 0.6 MG PO TABS: 0.6000 mg | ORAL_TABLET | Freq: Two times a day (BID) | ORAL | Status: DC

## 2013-03-05 NOTE — Patient Instructions (Addendum)
Please take Colchicine 0.6 mg twice a day for 2 weeks.  You may stop earlier if symptoms resolve. Continue all other medications as listed.  Follow up in 1 year with Dr Antoine Poche.  You will receive a letter in the mail 2 months before you are due.  Please call us when you receive this letter to schedule your follow up appointment.

## 2013-03-05 NOTE — Progress Notes (Signed)
HPI The patient presents for yearly followup. Since last saw him he has done well. He does complain of some left great toe pain. He's not having any cardiac symptoms. He denies any chest pressure, neck or arm discomfort. He has had no palpitations, presyncope or syncope. He's had some mild bruising on warfarin but no other issues. He denies any significant weight gain or weight loss. He has no PND or orthopnea. He doesn't exercise but he does his activities of daily living.  No Known Allergies  Current Outpatient Prescriptions  Medication Sig Dispense Refill  . aspirin EC 81 MG tablet Take 81 mg by mouth daily.      Marland Kitchen atorvastatin (LIPITOR) 20 MG tablet Take 1 tablet by mouth  daily  30 tablet  0  . diltiazem (MATZIM LA) 240 MG 24 hr tablet Take 1 tablet (240 mg total) by mouth daily.  90 tablet  3  . fluticasone (FLONASE) 50 MCG/ACT nasal spray Place 2 sprays into the nose daily.        . metoprolol succinate (TOPROL-XL) 50 MG 24 hr tablet One and one half tablets once daily  135 tablet  4  . nitroGLYCERIN (NITROSTAT) 0.4 MG SL tablet Place 1 tablet (0.4 mg total) under the tongue every 5 (five) minutes as needed.  25 tablet  2  . omeprazole (PRILOSEC) 20 MG capsule Take 1 capsule (20 mg total) by mouth daily.      . Tamsulosin HCl (FLOMAX) 0.4 MG CAPS Take 1 capsule (0.4 mg total) by mouth daily.  90 capsule  3  . warfarin (COUMADIN) 2.5 MG tablet Take as directed by the Coumadin Clinic  40 tablet  2   No current facility-administered medications for this visit.    Past Medical History  Diagnosis Date  . Actinic keratosis   . ALLERGIC RHINITIS   . Atrial fibrillation   . CAD, NATIVE VESSEL   . Cellulitis and abscess of trunk   . CHEST PAIN, ACUTE   . HYPERLIPIDEMIA   . HYPERTENSION   . OTH MALIG NEOPLASM SKIN OTH&UNSPEC PARTS FACE   . OTH MALIG NEOPLASM SKIN OTH\T\UNSPEC PARTS FACE   . PACEMAKER, PERMANENT 2008  . Myocardial infarction   . Angina   . GERD (gastroesophageal  reflux disease)     Past Surgical History  Procedure Laterality Date  . Cystectomy      from right side of face.  . Cataract extraction w/ intraocular lens  implant, bilateral  2012  . Back surgery  2000    "for bulging discs"  . Insert / replace / remove pacemaker  2008    with lead rvision and microperforation  . Coronary angioplasty with stent placement  2008  . Coronary angioplasty with stent placement  05/14/11    ROS:  As stated in the HPI and negative for all other systems.  PHYSICAL EXAM BP 138/70  Pulse 72  Ht 5\' 7"  (1.702 m)  Wt 202 lb (91.627 kg)  BMI 31.63 kg/m2 GENERAL:  Well appearing HEENT:  Pupils equal round and reactive, fundi not visualized, oral mucosa unremarkable NECK:  No jugular venous distention, waveform within normal limits, carotid upstroke brisk and symmetric, no bruits, no thyromegaly LYMPHATICS:  No cervical, inguinal adenopathy LUNGS:  Clear to auscultation bilaterally BACK:  No CVA tenderness CHEST:  Pacer scar HEART:  PMI not displaced or sustained,S1 and S2 within normal limits, no S3, no clicks, no rubs, no murmurs,  ABD:  Flat, positive bowel  sounds normal in frequency in pitch, no bruits, no rebound, no guarding, no midline pulsatile mass, no hepatomegaly, no splenomegaly EXT:  2 plus pulses throughout, mild bilateral lower extremityedema, no cyanosis no clubbing, his left toe is slightly swollen and red SKIN:  No rashes no nodules NEURO:  Cranial nerves II through XII grossly intact, motor grossly intact throughout PSYCH:  Cognitively intact, oriented to person place and time  EKG:  Atrial fibrillation, ventricular pacing with fusion complexes, no acute ST-T wave changes. 03/05/2013  ASSESSMENT AND PLAN  ATRIAL FIBRILLATION -  The patient tolerates this rhythm and rate control and anticoagulation. He could not afford the The patient is not interested in switching to a novel oral anticoagulant.  He will continue on warfarin.    CAD,  NATIVE VESSEL -  He had a stress perfusion study in 2012 demonstrated no high risk findings.  He has had no new symptoms since then. He will continue with risk reduction.  PACEMAKER, PERMANENT -  He has scheduled follow up for this.  No change in therapy is indicated.   GOUT - He has symptoms consistent with gout.  I will give him a prescription for colchicine twice a day.

## 2013-03-09 ENCOUNTER — Ambulatory Visit (INDEPENDENT_AMBULATORY_CARE_PROVIDER_SITE_OTHER): Payer: MEDICARE | Admitting: *Deleted

## 2013-03-09 DIAGNOSIS — Z950 Presence of cardiac pacemaker: Secondary | ICD-10-CM

## 2013-03-09 DIAGNOSIS — I4891 Unspecified atrial fibrillation: Secondary | ICD-10-CM

## 2013-03-11 ENCOUNTER — Telehealth: Payer: Self-pay | Admitting: Internal Medicine

## 2013-03-11 NOTE — Telephone Encounter (Deleted)
error 

## 2013-03-13 LAB — REMOTE PACEMAKER DEVICE
ATRIAL PACING PM: 2.23
BATTERY VOLTAGE: 2.9 V
RV LEAD IMPEDENCE PM: 432 Ohm
VENTRICULAR PACING PM: 50.43

## 2013-03-17 ENCOUNTER — Encounter: Payer: Self-pay | Admitting: *Deleted

## 2013-03-25 ENCOUNTER — Encounter: Payer: Self-pay | Admitting: Internal Medicine

## 2013-03-30 ENCOUNTER — Ambulatory Visit (INDEPENDENT_AMBULATORY_CARE_PROVIDER_SITE_OTHER): Payer: MEDICARE | Admitting: General Practice

## 2013-03-30 DIAGNOSIS — Z23 Encounter for immunization: Secondary | ICD-10-CM

## 2013-03-30 DIAGNOSIS — I4891 Unspecified atrial fibrillation: Secondary | ICD-10-CM

## 2013-04-07 ENCOUNTER — Other Ambulatory Visit: Payer: Self-pay

## 2013-04-07 MED ORDER — WARFARIN SODIUM 2.5 MG PO TABS: ORAL_TABLET | ORAL | Status: DC

## 2013-04-20 ENCOUNTER — Ambulatory Visit (INDEPENDENT_AMBULATORY_CARE_PROVIDER_SITE_OTHER): Payer: MEDICARE | Admitting: General Practice

## 2013-04-20 DIAGNOSIS — Z7901 Long term (current) use of anticoagulants: Secondary | ICD-10-CM

## 2013-04-20 DIAGNOSIS — I4891 Unspecified atrial fibrillation: Secondary | ICD-10-CM

## 2013-04-20 NOTE — Progress Notes (Signed)
Pre-visit discussion using our clinic review tool. No additional management support is needed unless otherwise documented below in the visit note.  

## 2013-05-21 ENCOUNTER — Encounter: Payer: Self-pay | Admitting: Cardiology

## 2013-05-21 ENCOUNTER — Ambulatory Visit (INDEPENDENT_AMBULATORY_CARE_PROVIDER_SITE_OTHER): Payer: MEDICARE | Admitting: Internal Medicine

## 2013-05-21 ENCOUNTER — Encounter: Payer: Self-pay | Admitting: Internal Medicine

## 2013-05-21 ENCOUNTER — Ambulatory Visit (INDEPENDENT_AMBULATORY_CARE_PROVIDER_SITE_OTHER): Payer: MEDICARE | Admitting: General Practice

## 2013-05-21 VITALS — BP 137/70 | HR 69 | Ht 67.0 in | Wt 203.0 lb

## 2013-05-21 DIAGNOSIS — I4891 Unspecified atrial fibrillation: Secondary | ICD-10-CM

## 2013-05-21 DIAGNOSIS — I495 Sick sinus syndrome: Secondary | ICD-10-CM

## 2013-05-21 LAB — MDC_IDC_ENUM_SESS_TYPE_INCLINIC
Battery Voltage: 2.9 V
Brady Statistic AP VP Percent: 1.83 %
Brady Statistic AS VP Percent: 48.1 %
Brady Statistic AS VS Percent: 50.01 %
Brady Statistic RV Percent Paced: 49.93 %
Date Time Interrogation Session: 20141218125139
Lead Channel Impedance Value: 480 Ohm
Lead Channel Setting Pacing Amplitude: 2.5 V
Lead Channel Setting Pacing Pulse Width: 0.4 ms
Lead Channel Setting Sensing Sensitivity: 0.9 mV
Zone Setting Detection Interval: 350 ms
Zone Setting Detection Interval: 400 ms

## 2013-05-21 NOTE — Progress Notes (Signed)
Pre-visit discussion using our clinic review tool. No additional management support is needed unless otherwise documented below in the visit note.  

## 2013-05-21 NOTE — Progress Notes (Signed)
HPI [name] returns today for followup. He is a pleasant 78 yo man with a h/o symptomatic bradycardia s/p PPM insertion. He has chronic atrial fibrillation and has done well. He has not seen me for 5 years since we put his PPM. No syncope or chest pain. He remains active though not as much as previously. No edema. No syncope. No Known Allergies   Current Outpatient Prescriptions  Medication Sig Dispense Refill  . aspirin EC 81 MG tablet Take 81 mg by mouth daily.      Marland Kitchen atorvastatin (LIPITOR) 20 MG tablet Take 1 tablet by mouth  daily  30 tablet  0  . colchicine 0.6 MG tablet Take 1 tablet (0.6 mg total) by mouth 2 (two) times daily.  30 tablet  0  . diltiazem (MATZIM LA) 240 MG 24 hr tablet Take 1 tablet (240 mg total) by mouth daily.  90 tablet  3  . fluticasone (FLONASE) 50 MCG/ACT nasal spray Place 2 sprays into the nose daily.        . metoprolol succinate (TOPROL-XL) 50 MG 24 hr tablet One and one half tablets once daily  135 tablet  4  . nitroGLYCERIN (NITROSTAT) 0.4 MG SL tablet Place 1 tablet (0.4 mg total) under the tongue every 5 (five) minutes as needed.  25 tablet  2  . omeprazole (PRILOSEC) 20 MG capsule Take 1 capsule (20 mg total) by mouth daily.      . Tamsulosin HCl (FLOMAX) 0.4 MG CAPS Take 1 capsule (0.4 mg total) by mouth daily.  90 capsule  3  . warfarin (COUMADIN) 2.5 MG tablet Take as directed by the [facility]  40 tablet  2   No current facility-administered medications for this visit.     Past Medical History  Diagnosis Date  . Actinic keratosis   . ALLERGIC RHINITIS   . Atrial fibrillation   . CAD, NATIVE VESSEL   . Cellulitis and abscess of trunk   . CHEST PAIN, ACUTE   . HYPERLIPIDEMIA   . HYPERTENSION   . OTH MALIG NEOPLASM SKIN OTH&UNSPEC PARTS FACE   . OTH MALIG NEOPLASM SKIN OTH\T\UNSPEC PARTS FACE   . PACEMAKER, PERMANENT 2008  . Myocardial infarction   . Angina   . GERD (gastroesophageal reflux disease)     ROS:   All  systems reviewed and negative except as noted in the HPI.   Past Surgical History  Procedure Laterality Date  . Cystectomy      from right side of face.  . Cataract extraction w/ intraocular lens  implant, bilateral  2012  . Back surgery  2000    "for bulging discs"  . Insert / replace / remove pacemaker  2008    with lead rvision and microperforation  . Coronary angioplasty with stent placement  2008  . Coronary angioplasty with stent placement  05/14/11     Family History  Problem Relation Age of Onset  . Colon cancer Other      History   Social History  . Marital Status: Married    Spouse Name: N/A    Number of Children: N/A  . Years of Education: N/A   Occupational History  . Not on file.   Social History Main Topics  . Smoking status: Never Smoker   . Smokeless tobacco: Never Used  . Alcohol Use: Yes     Comment: 05/14/11 "last occasional drink was in 1990's"  . Drug Use: No  .  Sexual Activity: Yes   Other Topics Concern  . Not on file   Social History Narrative  . No narrative on file     BP 137/70  Pulse 69  Ht 5\' 7"  (1.702 m)  Wt 203 lb (92.08 kg)  BMI 31.79 kg/m2  Physical Exam:  Well appearing NAD HEENT: Unremarkable Neck:  No JVD, no thyromegally Lymphatics:  No adenopathy Back:  No CVA tenderness Lungs:  Clear HEART:  Regular rate rhythm, no murmurs, no rubs, no clicks Abd:  soft, positive bowel sounds, no organomegally, no rebound, no guarding Ext:  2 plus pulses, no edema, no cyanosis, no clubbing Skin:  No rashes no nodules Neuro:  CN II through XII intact, motor grossly intact   DEVICE  Normal device function.  See PaceArt for details.   Assess/Plan:

## 2013-05-21 NOTE — Patient Instructions (Signed)
Your physician wants you to follow-up in: 12 months with Dr Court Joy will receive a reminder letter in the mail two months in advance. If you don't receive a letter, please call our office to schedule the follow-up appointment.   Remote monitoring is used to monitor your Pacemaker of ICD from home. This monitoring reduces the number of office visits required to check your device to one time per year. It allows [place] to keep an eye on the functioning of your device to ensure it is working properly. You are scheduled for a device check from home on 08/24/13. You may send your transmission at any time that day. If you have a wireless device, the transmission will be sent automatically. After your physician reviews your transmission, you will receive a postcard with your next transmission date.

## 2013-06-03 ENCOUNTER — Encounter: Payer: MEDICARE | Admitting: Internal Medicine

## 2013-06-10 ENCOUNTER — Encounter: Payer: MEDICARE | Admitting: Internal Medicine

## 2013-06-17 ENCOUNTER — Encounter: Payer: Self-pay | Admitting: Internal Medicine

## 2013-06-17 ENCOUNTER — Other Ambulatory Visit: Payer: Self-pay | Admitting: Internal Medicine

## 2013-06-17 ENCOUNTER — Ambulatory Visit (INDEPENDENT_AMBULATORY_CARE_PROVIDER_SITE_OTHER): Payer: MEDICARE | Admitting: Internal Medicine

## 2013-06-17 VITALS — BP 140/74 | HR 72 | Temp 97.3°F | Resp 16 | Ht 67.0 in | Wt 204.0 lb

## 2013-06-17 DIAGNOSIS — Z0000 Encounter for general adult medical examination without abnormal findings: Secondary | ICD-10-CM

## 2013-06-17 DIAGNOSIS — R351 Nocturia: Secondary | ICD-10-CM

## 2013-06-17 DIAGNOSIS — I2510 Atherosclerotic heart disease of native coronary artery without angina pectoris: Secondary | ICD-10-CM

## 2013-06-17 DIAGNOSIS — I10 Essential (primary) hypertension: Secondary | ICD-10-CM

## 2013-06-17 DIAGNOSIS — T887XXA Unspecified adverse effect of drug or medicament, initial encounter: Secondary | ICD-10-CM

## 2013-06-17 DIAGNOSIS — K219 Gastro-esophageal reflux disease without esophagitis: Secondary | ICD-10-CM

## 2013-06-17 DIAGNOSIS — E785 Hyperlipidemia, unspecified: Secondary | ICD-10-CM

## 2013-06-17 DIAGNOSIS — N401 Enlarged prostate with lower urinary tract symptoms: Secondary | ICD-10-CM

## 2013-06-17 DIAGNOSIS — I4891 Unspecified atrial fibrillation: Secondary | ICD-10-CM

## 2013-06-17 DIAGNOSIS — D696 Thrombocytopenia, unspecified: Secondary | ICD-10-CM

## 2013-06-17 LAB — CBC WITH DIFFERENTIAL/PLATELET
BASOS PCT: 0.4 % (ref 0.0–3.0)
Basophils Absolute: 0 10*3/uL (ref 0.0–0.1)
EOS ABS: 0.2 10*3/uL (ref 0.0–0.7)
Eosinophils Relative: 2.6 % (ref 0.0–5.0)
HCT: 46.5 % (ref 39.0–52.0)
Hemoglobin: 15.8 g/dL (ref 13.0–17.0)
LYMPHS PCT: 22.5 % (ref 12.0–46.0)
Lymphs Abs: 1.9 10*3/uL (ref 0.7–4.0)
MCHC: 34 g/dL (ref 30.0–36.0)
MCV: 91.4 fl (ref 78.0–100.0)
MONO ABS: 0.7 10*3/uL (ref 0.1–1.0)
Monocytes Relative: 7.5 % (ref 3.0–12.0)
NEUTROS PCT: 67 % (ref 43.0–77.0)
Neutro Abs: 5.8 10*3/uL (ref 1.4–7.7)
PLATELETS: 188 10*3/uL (ref 150.0–400.0)
RBC: 5.09 Mil/uL (ref 4.22–5.81)
RDW: 13.6 % (ref 11.5–14.6)
WBC: 8.7 10*3/uL (ref 4.5–10.5)

## 2013-06-17 LAB — HEPATIC FUNCTION PANEL
ALK PHOS: 66 U/L (ref 39–117)
ALT: 33 U/L (ref 0–53)
AST: 26 U/L (ref 0–37)
Albumin: 4.1 g/dL (ref 3.5–5.2)
BILIRUBIN TOTAL: 1 mg/dL (ref 0.3–1.2)
Bilirubin, Direct: 0.2 mg/dL (ref 0.0–0.3)
TOTAL PROTEIN: 7.3 g/dL (ref 6.0–8.3)

## 2013-06-17 LAB — BASIC METABOLIC PANEL
BUN: 15 mg/dL (ref 6–23)
CHLORIDE: 107 meq/L (ref 96–112)
CO2: 30 meq/L (ref 19–32)
CREATININE: 1.3 mg/dL (ref 0.4–1.5)
Calcium: 9.6 mg/dL (ref 8.4–10.5)
GFR: 58.01 mL/min — ABNORMAL LOW (ref >60.00)
GLUCOSE: 116 mg/dL — AB (ref 70–99)
Potassium: 4.8 mEq/L (ref 3.5–5.1)
Sodium: 142 mEq/L (ref 135–145)

## 2013-06-17 LAB — LIPID PANEL
CHOL/HDL RATIO: 3
Cholesterol: 128 mg/dL (ref 0–200)
HDL: 38.4 mg/dL — ABNORMAL LOW (ref >39.00)
LDL CALC: 64 mg/dL (ref 0–99)
Triglycerides: 129 mg/dL (ref 0.0–149.0)
VLDL: 25.8 mg/dL (ref 0.0–40.0)

## 2013-06-17 LAB — TSH: TSH: 3.03 u[IU]/mL (ref 0.35–5.50)

## 2013-06-17 LAB — PSA: PSA: 3.74 ng/mL (ref 0.10–4.00)

## 2013-06-17 MED ORDER — OMEPRAZOLE 20 MG PO CPDR: 20.0000 mg | DELAYED_RELEASE_CAPSULE | Freq: Every day | ORAL | Status: DC

## 2013-06-17 NOTE — Progress Notes (Signed)
Subjective:    Patient ID: Shawn [name], male    DOB: Dec 03, 1934, 79 y.o.   MRN: [id number]  HPI  Is a 79 year old male who presents for Medicare wellness examination and routine followup for a history of atrial fibrillation hypertension hyperlipidemia mild chronic congestive failure. He was recently seen his cardiologist we reviewed the note from the cardiologist as well as all prior blood work.  He will have appropriate screening based upon his diagnoses today for management of his active problems  Review of Systems  Constitutional: Negative for fever and fatigue.       Weight gain  HENT: Positive for ear discharge and postnasal drip. Negative for congestion and hearing loss.   Eyes: Negative for discharge, redness and visual disturbance.  Respiratory: Positive for cough. Negative for shortness of breath and wheezing.   Cardiovascular: Negative for leg swelling.       Edema with chronic venous stasis pigmentation changes  Gastrointestinal: Negative for abdominal pain, constipation and abdominal distention.  Genitourinary: Negative for urgency and frequency.  Musculoskeletal: Negative for arthralgias, joint swelling and neck pain.  Skin: Positive for color change. Negative for rash.  Neurological: Negative for weakness and light-headedness.  Hematological: Negative for adenopathy.  Psychiatric/Behavioral: Negative for behavioral problems.   Past Medical History  Diagnosis Date  . Actinic keratosis   . ALLERGIC RHINITIS   . Atrial fibrillation   . CAD, NATIVE VESSEL   . Cellulitis and abscess of trunk   . CHEST PAIN, ACUTE   . HYPERLIPIDEMIA   . HYPERTENSION   . OTH MALIG NEOPLASM SKIN OTH&UNSPEC PARTS FACE   . OTH MALIG NEOPLASM SKIN OTH\T\UNSPEC PARTS FACE   . PACEMAKER, PERMANENT 2008  . Myocardial infarction   . Angina   . GERD (gastroesophageal reflux disease)     History   Social History  . Marital Status: Married    Spouse Name: N/A    Number of  Children: N/A  . Years of Education: N/A   Occupational History  . Not on file.   Social History Main Topics  . Smoking status: Never Smoker   . Smokeless tobacco: Never Used  . Alcohol Use: Yes     Comment: 05/14/11 "last occasional drink was in 1990's"  . Drug Use: No  . Sexual Activity: Yes   Other Topics Concern  . Not on file   Social History Narrative  . No narrative on file    Past Surgical History  Procedure Laterality Date  . Cystectomy      from right side of face.  . Cataract extraction w/ intraocular lens  implant, bilateral  2012  . Back surgery  2000    "for bulging discs"  . Insert / replace / remove pacemaker  2008    with lead rvision and microperforation  . Coronary angioplasty with stent placement  2008  . Coronary angioplasty with stent placement  05/14/11    Family History  Problem Relation Age of Onset  . Colon cancer Other     No Known Allergies  Current Outpatient Prescriptions on File Prior to Visit  Medication Sig Dispense Refill  . aspirin EC 81 MG tablet Take 81 mg by mouth daily.      Marland Kitchen atorvastatin (LIPITOR) 20 MG tablet Take 1 tablet by mouth  daily  30 tablet  0  . colchicine 0.6 MG tablet Take 1 tablet (0.6 mg total) by mouth 2 (two) times daily.  30 tablet  0  . fluticasone (  FLONASE) 50 MCG/ACT nasal spray Place 2 sprays into the nose daily.        . metoprolol succinate (TOPROL-XL) 50 MG 24 hr tablet One and one half tablets once daily  135 tablet  4  . nitroGLYCERIN (NITROSTAT) 0.4 MG SL tablet Place 1 tablet (0.4 mg total) under the tongue every 5 (five) minutes as needed.  25 tablet  2  . omeprazole (PRILOSEC) 20 MG capsule Take 1 capsule (20 mg total) by mouth daily.      Marland Kitchen warfarin (COUMADIN) 2.5 MG tablet Take as directed by the [facility]  40 tablet  2   No current facility-administered medications on file prior to visit.    BP 140/74  Pulse 72  Temp(Src) 97.3 F (36.3 C)  Resp 16  Ht 5\' 7"  (1.702 m)  Wt 204  lb (92.534 kg)  BMI 31.94 kg/m2    Objective:   Physical Exam  Vitals reviewed. Constitutional: He appears well-developed and well-nourished.  HENT:  Head: Normocephalic and atraumatic.  Eyes: Conjunctivae are normal. Pupils are equal, round, and reactive to light.  Neck: Normal range of motion. Neck supple.  Cardiovascular: Normal rate, regular rhythm and intact distal pulses.   1+ pitting edema to the midcalf chronic venous stasis changes with venous stasis dermatitis Chronic pigment changes  Pulmonary/Chest: Effort normal and breath sounds normal.  Abdominal: Soft. Bowel sounds are normal.  Genitourinary:  Enlarged prostate without mass  Musculoskeletal: He exhibits edema and tenderness.  Skin: Skin is warm and dry. Rash noted.  Psychiatric: He has a normal mood and affect. His behavior is normal.          Assessment & Plan:  Patient does do routine laboratory screening for her lipids her liver functions due to potential side effects of his medications a CBC duexis history of GERD in the context of atrial fibrillation and anticoagulation.  He also should have a renal functions due to his history of hypertension and atrial fibrillation.  A TSH should be monitored due to the history of hyperlipidemia.  He does have a history of CAD he is without chest pain has mild to moderate chronic congestive heart failure with lower extremity edema that is stable but he does have chronic venous stasis changes Subjective:    Shawn [name] is a 79 y.o. male who presents for Medicare Annual/Subsequent preventive examination.   Preventive Screening-Counseling & Management  Tobacco History  Smoking status  . Never Smoker   Smokeless tobacco  . Never Used    Problems Prior to Visit 1.   Current Problems (verified) Patient Active Problem List   Diagnosis Date Noted  . Thrombocytopenia 06/17/2011  . CAD, NATIVE VESSEL 09/27/2008  . Hx of skin cancer, basal cell 04/07/2008    . SKIN TAG 04/07/2008  . ACTINIC KERATOSIS 04/07/2008  . OTH MALIG NEOPLASM SKIN OTH&UNSPEC PARTS FACE 04/07/2008  . HYPERLIPIDEMIA 03/10/2008  . UNS ADVRS EFF UNS RX MEDICINAL&BIOLOGICAL SBSTNC 03/10/2008  . PACEMAKER, PERMANENT 03/11/2007  . ATRIAL FIBRILLATION 02/11/2007  . HYPERTENSION 12/02/2006  . ALLERGIC RHINITIS 12/02/2006  . GERD 12/02/2006    Medications Prior to Visit Current Outpatient Prescriptions on File Prior to Visit  Medication Sig Dispense Refill  . aspirin EC 81 MG tablet Take 81 mg by mouth daily.      Marland Kitchen atorvastatin (LIPITOR) 20 MG tablet Take 1 tablet by mouth  daily  30 tablet  0  . colchicine 0.6 MG tablet Take 1 tablet (0.6 mg total)  by mouth 2 (two) times daily.  30 tablet  0  . fluticasone (FLONASE) 50 MCG/ACT nasal spray Place 2 sprays into the nose daily.        . metoprolol succinate (TOPROL-XL) 50 MG 24 hr tablet One and one half tablets once daily  135 tablet  4  . nitroGLYCERIN (NITROSTAT) 0.4 MG SL tablet Place 1 tablet (0.4 mg total) under the tongue every 5 (five) minutes as needed.  25 tablet  2  . omeprazole (PRILOSEC) 20 MG capsule Take 1 capsule (20 mg total) by mouth daily.      Marland Kitchen warfarin (COUMADIN) 2.5 MG tablet Take as directed by the [facility]  40 tablet  2   No current facility-administered medications on file prior to visit.    Current Medications (verified) Current Outpatient Prescriptions  Medication Sig Dispense Refill  . aspirin EC 81 MG tablet Take 81 mg by mouth daily.      Marland Kitchen atorvastatin (LIPITOR) 20 MG tablet Take 1 tablet by mouth  daily  30 tablet  0  . colchicine 0.6 MG tablet Take 1 tablet (0.6 mg total) by mouth 2 (two) times daily.  30 tablet  0  . diltiazem (CARDIZEM LA) 240 MG 24 hr tablet Take 1 tablet (240 mg  total) by mouth daily.  90 tablet  3  . fluticasone (FLONASE) 50 MCG/ACT nasal spray Place 2 sprays into the nose daily.        . metoprolol succinate (TOPROL-XL) 50 MG 24 hr tablet One and one half  tablets once daily  135 tablet  4  . nitroGLYCERIN (NITROSTAT) 0.4 MG SL tablet Place 1 tablet (0.4 mg total) under the tongue every 5 (five) minutes as needed.  25 tablet  2  . omeprazole (PRILOSEC) 20 MG capsule Take 1 capsule (20 mg total) by mouth daily.      . tamsulosin (FLOMAX) 0.4 MG CAPS capsule Take 1 capsule (0.4 mg  total) by mouth daily.  90 capsule  3  . warfarin (COUMADIN) 2.5 MG tablet Take as directed by the [facility]  40 tablet  2   No current facility-administered medications for this visit.     Allergies (verified) Review of patient's allergies indicates no known allergies.   PAST HISTORY  Family History Family History  Problem Relation Age of Onset  . Colon cancer Other     Social History History  Substance Use Topics  . Smoking status: Never Smoker   . Smokeless tobacco: Never Used  . Alcohol Use: Yes     Comment: 05/14/11 "last occasional drink was in 1990's"    Are there smokers in your home (other than you)?  No  Risk Factors Current exercise habits: The patient does not participate in regular exercise at present.  Dietary issues discussed: weight loss and walking discussed   Cardiac risk factors: advanced age (older than 32 for men, 78 for women), dyslipidemia, hypertension, male gender and cad.  Depression Screen (Note: if answer to either of the following is "Yes", a more complete depression screening is indicated)   Q1: Over the past two weeks, have you felt down, depressed or hopeless? No  Q2: Over the past two weeks, have you felt little interest or pleasure in doing things? No  Have you lost interest or pleasure in daily life? No  Do you often feel hopeless? No  Do you cry easily over simple problems? No  Activities of Daily Living In your present state of health,  do you have any difficulty performing the following activities?:  Driving? No Managing money?  No Feeding yourself? No Getting from bed to chair? No Climbing a flight  of stairs? No Preparing food and eating?: No Bathing or showering? No Getting dressed: No Getting to the toilet? No Using the toilet:No Moving around from place to place: No In the past year have you fallen or had a near fall?:No   Are you sexually active?  No  Do you have more than one partner?  No  Hearing Difficulties: No Do you often ask people to speak up or repeat themselves? Yes Do you experience ringing or noises in your ears? No Do you have difficulty understanding soft or whispered voices? No   Do you feel that you have a problem with memory? No  Do you often misplace items? No  Do you feel safe at home?  Yes  Cognitive Testing  Alert? Yes  Normal Appearance?Yes  Oriented to person? Yes  Place? Yes   Time? Yes  Recall of three objects?  Yes  Can perform simple calculations? Yes  Displays appropriate judgment?Yes  Can read the correct time from a watch face?Yes   Advanced Directives have been discussed with the patient? Yes   List the Names of Other Physician/Practitioners you currently use: 1.    Indicate any recent Medical Services you may have received from other than [facility] providers in the past year (date may be approximate).  Immunization History  Administered Date(s) Administered  . Influenza Split 03/24/2012  . Influenza Whole 03/10/2008, 03/16/2009, 04/05/2010  . Influenza,inj,Quad PF,36+ Mos 03/30/2013  . Pneumococcal Polysaccharide-23 11/05/2011  . Tdap 11/05/2011    Screening Tests Health Maintenance  Topic Date Due  . Colonoscopy  01/20/1987  . Zostavax  01/19/1997  . Influenza Vaccine  01/02/2014  . Tetanus/tdap  11/04/2021  . Pneumococcal Polysaccharide Vaccine Age 69 And Over  Completed    All answers were reviewed with the patient and necessary referrals were made:  Georgetta Haber, MD   06/17/2013   History reviewed: allergies, current medications, past family history, past medical history, past social history, past surgical  history and problem list  Review of Systems Pertinent items are noted in HPI.    Objective:     Vision by Snellen chart: right eye:20/20, left eye:20/20 Blood pressure 140/74, pulse 72, temperature 97.3 F (36.3 C), resp. rate 16, height 5\' 7"  (1.702 m), weight 204 lb (92.534 kg). Body mass index is 31.94 kg/(m^2). Exam per first section     Assessment:      Patient presents for yearly preventative medicine examination.   all immunizations and health maintenance protocols were reviewed with the patient and they are up to date with these protocols.   screening laboratory values were reviewed with the patient including screening of hyperlipidemia PSA renal function and hepatic function.   There medications past medical history social history problem list and allergies were reviewed in detail.   Goals were established with regard to weight loss exercise diet in compliance with medications      Plan:     During the course of the visit the patient was educated and counseled about appropriate screening and preventive services including:    Pneumococcal vaccine   Influenza vaccine  Td vaccine  Prostate cancer screening  Diet review for nutrition referral? Yes ____  Not Indicated ____   Patient Instructions (the written plan) was given to the patient.  Medicare Attestation I have personally reviewed: The  patient's medical and social history Their use of alcohol, tobacco or illicit drugs Their current medications and supplements The patient's functional ability including ADLs,fall risks, home safety risks, cognitive, and hearing and visual impairment Diet and physical activities Evidence for depression or mood disorders  The patient's weight, height, BMI, and visual acuity have been recorded in the chart.  I have made referrals, counseling, and provided education to the patient based on review of the above and I have provided the patient with a written personalized  care plan for preventive services.     Georgetta Haber, MD   06/17/2013

## 2013-06-17 NOTE — Patient Instructions (Signed)
The patient is instructed to continue all medications as prescribed. Schedule followup with check out clerk upon leaving the clinic  

## 2013-06-17 NOTE — Addendum Note (Signed)
Addended by: Allyne Gee on: 06/17/2013 10:02 AM   Modules accepted: Orders

## 2013-06-17 NOTE — Progress Notes (Signed)
Pre visit review using our clinic review tool, if applicable. No additional management support is needed unless otherwise documented below in the visit note. 

## 2013-06-18 ENCOUNTER — Encounter: Payer: Self-pay | Admitting: Cardiology

## 2013-06-29 ENCOUNTER — Telehealth: Payer: Self-pay | Admitting: Internal Medicine

## 2013-06-29 ENCOUNTER — Other Ambulatory Visit: Payer: Self-pay | Admitting: General Practice

## 2013-06-29 ENCOUNTER — Other Ambulatory Visit: Payer: Self-pay | Admitting: *Deleted

## 2013-06-29 MED ORDER — WARFARIN SODIUM 2.5 MG PO TABS: ORAL_TABLET | ORAL | Status: DC

## 2013-06-29 NOTE — Telephone Encounter (Signed)
[place] requesting refill of warfarin (COUMADIN) 2.5 MG tablet #40, last filled 06/01/13

## 2013-07-01 ENCOUNTER — Ambulatory Visit (INDEPENDENT_AMBULATORY_CARE_PROVIDER_SITE_OTHER): Payer: MEDICARE | Admitting: Family

## 2013-07-01 DIAGNOSIS — I4891 Unspecified atrial fibrillation: Secondary | ICD-10-CM

## 2013-07-01 LAB — POCT INR: INR: 2.7

## 2013-07-01 NOTE — Patient Instructions (Signed)
Anticoagulation Dose Instructions as of 07/01/2013     Shawn [name] Tue Wed Thu Fri Sat   New Dose 2.5 mg 5 mg 2.5 mg 5 mg 2.5 mg 5 mg 2.5 mg    Description       Continue 2.5 mg all days except 5 mg on M/W/F.  Re-check 6 weeks.

## 2013-07-02 ENCOUNTER — Ambulatory Visit: Payer: MEDICARE

## 2013-07-08 ENCOUNTER — Telehealth: Payer: Self-pay | Admitting: Internal Medicine

## 2013-07-08 NOTE — Telephone Encounter (Signed)
Relevant patient education mailed to patient.  

## 2013-07-15 ENCOUNTER — Ambulatory Visit (INDEPENDENT_AMBULATORY_CARE_PROVIDER_SITE_OTHER): Payer: MEDICARE | Admitting: Family

## 2013-07-15 ENCOUNTER — Encounter: Payer: Self-pay | Admitting: Family

## 2013-07-15 VITALS — BP 120/62 | HR 94 | Ht 67.0 in | Wt 202.0 lb

## 2013-07-15 DIAGNOSIS — L0211 Cutaneous abscess of neck: Secondary | ICD-10-CM

## 2013-07-15 DIAGNOSIS — L570 Actinic keratosis: Secondary | ICD-10-CM

## 2013-07-15 DIAGNOSIS — L03221 Cellulitis of neck: Secondary | ICD-10-CM

## 2013-07-15 MED ORDER — CEPHALEXIN 500 MG PO CAPS: 500.0000 mg | ORAL_CAPSULE | Freq: Three times a day (TID) | ORAL | Status: DC

## 2013-07-15 MED ORDER — CEPHALEXIN 500 MG PO CAPS
500.0000 mg | ORAL_CAPSULE | Freq: Three times a day (TID) | ORAL | Status: DC
Start: 2013-07-15 — End: 2013-08-12

## 2013-07-15 NOTE — Progress Notes (Signed)
Subjective:    Patient ID: Shawn [name], male    DOB: 02/24/1935, 79 y.o.   MRN: [id number]  HPI 79 y.o. White male presents to clinic today with chief complaint of "red, swollen lump on the back left side of my neck". Pt states that the "lump" started 2 weeks ago and was the size of a marble. On Saturday it began to swell, turn red and became very tender. Pt has put Bacitracin cream on the abscess but it has not provided relief. Pt denies any recent bug bites or trauma to skin in the area of the abscess. Denies fever, chill, fatigue and weight change.  Also expressed concern of 2 brown, dry, rough, raised areas to his midback. They do not bleed. Present several months. Appear to be growing.     Review of Systems  Constitutional: Negative.   HENT: Negative.   Eyes: Negative.   Respiratory: Negative.   Cardiovascular: Negative.   Gastrointestinal: Negative.   Endocrine: Negative.   Genitourinary: Negative.   Musculoskeletal: Negative.   Skin: Positive for wound.       Swollen, red "lump" to neck  2 moles to back-growing  Allergic/Immunologic: Negative.   Neurological: Negative.   Hematological: Negative.   Psychiatric/Behavioral: Negative.        Past Medical History  Diagnosis Date  . Actinic keratosis   . ALLERGIC RHINITIS   . Atrial fibrillation   . CAD, NATIVE VESSEL   . Cellulitis and abscess of trunk   . CHEST PAIN, ACUTE   . HYPERLIPIDEMIA   . HYPERTENSION   . OTH MALIG NEOPLASM SKIN OTH&UNSPEC PARTS FACE   . OTH MALIG NEOPLASM SKIN OTH\T\UNSPEC PARTS FACE   . PACEMAKER, PERMANENT 2008  . Myocardial infarction   . Angina   . GERD (gastroesophageal reflux disease)     History   Social History  . Marital Status: Married    Spouse Name: N/A    Number of Children: N/A  . Years of Education: N/A   Occupational History  . Not on file.   Social History Main Topics  . Smoking status: Never Smoker   . Smokeless tobacco: Never Used  . Alcohol Use: Yes      Comment: 05/14/11 "last occasional drink was in 1990's"  . Drug Use: No  . Sexual Activity: Yes   Other Topics Concern  . Not on file   Social History Narrative  . No narrative on file    Past Surgical History  Procedure Laterality Date  . Cystectomy      from right side of face.  . Cataract extraction w/ intraocular lens  implant, bilateral  2012  . Back surgery  2000    "for bulging discs"  . Insert / replace / remove pacemaker  2008    with lead rvision and microperforation  . Coronary angioplasty with stent placement  2008  . Coronary angioplasty with stent placement  05/14/11    Family History  Problem Relation Age of Onset  . Colon cancer Other     No Known Allergies  Current Outpatient Prescriptions on File Prior to Visit  Medication Sig Dispense Refill  . aspirin EC 81 MG tablet Take 81 mg by mouth daily.      Marland Kitchen atorvastatin (LIPITOR) 20 MG tablet Take 1 tablet by mouth  daily  30 tablet  0  . colchicine 0.6 MG tablet Take 1 tablet (0.6 mg total) by mouth 2 (two) times daily.  30 tablet  0  . diltiazem (CARDIZEM LA) 240 MG 24 hr tablet Take 1 tablet (240 mg  total) by mouth daily.  90 tablet  3  . fluticasone (FLONASE) 50 MCG/ACT nasal spray Place 2 sprays into the nose daily.        . metoprolol succinate (TOPROL-XL) 50 MG 24 hr tablet One and one half tablets once daily  135 tablet  4  . nitroGLYCERIN (NITROSTAT) 0.4 MG SL tablet Place 1 tablet (0.4 mg total) under the tongue every 5 (five) minutes as needed.  25 tablet  2  . omeprazole (PRILOSEC) 20 MG capsule Take 1 capsule (20 mg total) by mouth daily.  90 capsule  3  . tamsulosin (FLOMAX) 0.4 MG CAPS capsule Take 1 capsule (0.4 mg  total) by mouth daily.  90 capsule  3  . warfarin (COUMADIN) 2.5 MG tablet Take as directed by the [facility]  90 tablet  0   No current facility-administered medications on file prior to visit.    BP 120/62  Pulse 94  Ht 5\' 7"  (1.702 m)  Wt 202 lb (91.627 kg)  BMI  31.63 kg/m2chart Objective:   Physical Exam  Constitutional: He is oriented to person, place, and time. He appears well-developed and well-nourished. He is active.  HENT:  Head: Normocephalic.  Cardiovascular: Normal rate, regular rhythm and normal heart sounds.   Pulmonary/Chest: Effort normal and breath sounds normal.  Abdominal: Soft. Normal appearance and bowel sounds are normal.  Neurological: He is alert and oriented to person, place, and time.  Skin: Skin is warm and dry.     Abscess to left anterior neck. Abscess presents as swollen, hard, red color, warm to touch.  - Actinic Keratoses to upper back.   Psychiatric: He has a normal mood and affect. His speech is normal and behavior is normal.   I&D Abscess: Informed consent obtained. Under sterile technique, the abscess was anesthetized with 1.5cc of Lidocaine. 1 cm incision to expel a moderate amount of thick pustular discharge. Patient tolerated procedure well. Bleeding controlled. Dressing applied by CMA.   Cryotherapy: Informed consent obtained. 2 rough, brown lesion to the mid-back. 10 sec of cryo provided to each lesion x 3. Patient tolerated procedure well.      Assessment & Plan:  Norvell was seen today for no specified reason.  Diagnoses and associated orders for this visit:  Abscess, neck - Wound culture  Actinic keratoses  Other Orders - Discontinue: cephALEXin (KEFLEX) 500 MG capsule; Take 1 capsule (500 mg total) by mouth 3 (three) times daily. - cephALEXin (KEFLEX) 500 MG capsule; Take 1 capsule (500 mg total) by mouth 3 (three) times daily.    -Education: Keep incision clean and covered.   - Dressing changes as needed.   - Complete total coarse of antibiotics as prescribed.  Follow up: will call with wound culture results.

## 2013-07-15 NOTE — Progress Notes (Signed)
Pre visit review using our clinic review tool, if applicable. No additional management support is needed unless otherwise documented below in the visit note. 

## 2013-07-15 NOTE — Patient Instructions (Signed)
Incision Care  An incision is when a surgeon cuts into your body tissues. After surgery, the incision needs to be cared for properly to prevent infection.   HOME CARE INSTRUCTIONS    Take all medicine as directed by your caregiver. Only take over-the-counter or prescription medicines for pain, discomfort, or fever as directed by your caregiver.   Do not remove your bandage (dressing) or get your incision wet until your surgeon gives you permission. In the event that your dressing becomes wet, dirty, or starts to smell, change the dressing and call your surgeon for instructions as soon as possible.   Take showers. Do not take tub baths, swim, or do anything that may soak the wound until it is healed.   Resume your normal diet and activities as directed or allowed.   Avoid lifting any weight until you are instructed otherwise.   Use anti-itch antihistamine medicine as directed by your caregiver. The wound may itch when it is healing. Do not pick or scratch at the wound.   Follow up with your caregiver for stitch (suture) or staple removal as directed.   Drink enough fluids to keep your urine clear or pale yellow.  SEEK MEDICAL CARE IF:    You have redness, swelling, or increasing pain in the wound that is not controlled with medicine.   You have drainage, blood, or pus coming from the wound that lasts longer than 1 day.   You develop muscle aches, chills, or a general ill feeling.   You notice a bad smell coming from the wound or dressing.   Your wound edges separate after the sutures, staples, or skin adhesive strips have been removed.   You develop persistent nausea or vomiting.  SEEK IMMEDIATE MEDICAL CARE IF:    You have a fever.   You develop a rash.   You develop dizzy episodes or faint while standing.   You have difficulty breathing.   You develop any reaction or side effects to medicine given.  MAKE SURE YOU:    Understand these instructions.   Will watch your condition.   Will get help  right away if you are not doing well or get worse.  Document Released: 12/08/2004 Document Revised: 08/13/2011 Document Reviewed: 09/24/2010  [employer] Patient Information 2014 [employer].

## 2013-08-12 ENCOUNTER — Encounter: Payer: Self-pay | Admitting: Family

## 2013-08-12 ENCOUNTER — Ambulatory Visit (INDEPENDENT_AMBULATORY_CARE_PROVIDER_SITE_OTHER): Payer: MEDICARE | Admitting: Family

## 2013-08-12 VITALS — BP 120/68 | Temp 98.8°F | Wt 200.0 lb

## 2013-08-12 DIAGNOSIS — I4891 Unspecified atrial fibrillation: Secondary | ICD-10-CM

## 2013-08-12 DIAGNOSIS — I2510 Atherosclerotic heart disease of native coronary artery without angina pectoris: Secondary | ICD-10-CM

## 2013-08-12 DIAGNOSIS — L03221 Cellulitis of neck: Secondary | ICD-10-CM

## 2013-08-12 DIAGNOSIS — L0211 Cutaneous abscess of neck: Secondary | ICD-10-CM

## 2013-08-12 LAB — POCT INR: INR: 2.4

## 2013-08-12 NOTE — Progress Notes (Signed)
Subjective:    Patient ID: Shawn [name], male    DOB: [birth year], 79 y.o.   MRN: [id number]  HPI 79 y.o. White male presents today for six week follow up after having I&D performed to left neck and to get an INR check. Pt states that his abscess has healed up well, no drainage, no warmth or tenderness and he completed his antibiotics. Pt reports having good energy and good spirits as he is dealing with a recent diagnosis for his wife of pancreatic cancer. Pt denies fever, fatigue, change in appetite.     Review of Systems  Constitutional: Negative.   HENT: Negative.   Eyes: Negative.   Respiratory: Negative.   Cardiovascular: Negative.   Gastrointestinal: Negative.   Endocrine: Negative.   Genitourinary: Negative.   Musculoskeletal: Negative.   Skin: Negative.   Allergic/Immunologic: Negative.   Neurological: Negative.   Hematological: Negative.   Psychiatric/Behavioral: Negative.    Past Medical History  Diagnosis Date  . Actinic keratosis   . ALLERGIC RHINITIS   . Atrial fibrillation   . CAD, NATIVE VESSEL   . Cellulitis and abscess of trunk   . CHEST PAIN, ACUTE   . HYPERLIPIDEMIA   . HYPERTENSION   . OTH MALIG NEOPLASM SKIN OTH&UNSPEC PARTS FACE   . OTH MALIG NEOPLASM SKIN OTH\T\UNSPEC PARTS FACE   . PACEMAKER, PERMANENT 2008  . Myocardial infarction   . Angina   . GERD (gastroesophageal reflux disease)     History   Social History  . Marital Status: Married    Spouse Name: N/A    Number of Children: N/A  . Years of Education: N/A   Occupational History  . Not on file.   Social History Main Topics  . Smoking status: Never Smoker   . Smokeless tobacco: Never Used  . Alcohol Use: Yes     Comment: 05/14/11 "last occasional drink was in 1990's"  . Drug Use: No  . Sexual Activity: Yes   Other Topics Concern  . Not on file   Social History Narrative  . No narrative on file    Past Surgical History  Procedure Laterality Date  . Cystectomy      from right side of face.  . Cataract extraction w/ intraocular lens  implant, bilateral  2012  . Back surgery  2000    "for bulging discs"  . Insert / replace / remove pacemaker  2008    with lead rvision and microperforation  . Coronary angioplasty with stent placement  2008  . Coronary angioplasty with stent placement  05/14/11    Family History  Problem Relation Age of Onset  . Colon cancer Other     No Known Allergies  Current Outpatient Prescriptions on File Prior to Visit  Medication Sig Dispense Refill  . aspirin EC 81 MG tablet Take 81 mg by mouth daily.      Marland Kitchen atorvastatin (LIPITOR) 20 MG tablet Take 1 tablet by mouth  daily  30 tablet  0  . colchicine 0.6 MG tablet Take 1 tablet (0.6 mg total) by mouth 2 (two) times daily.  30 tablet  0  . diltiazem (CARDIZEM LA) 240 MG 24 hr tablet Take 1 tablet (240 mg  total) by mouth daily.  90 tablet  3  . fluticasone (FLONASE) 50 MCG/ACT nasal spray Place 2 sprays into the nose daily.        . metoprolol succinate (TOPROL-XL) 50 MG 24 hr tablet One and one half tablets  once daily  135 tablet  4  . nitroGLYCERIN (NITROSTAT) 0.4 MG SL tablet Place 1 tablet (0.4 mg total) under the tongue every 5 (five) minutes as needed.  25 tablet  2  . omeprazole (PRILOSEC) 20 MG capsule Take 1 capsule (20 mg total) by mouth daily.  90 capsule  3  . tamsulosin (FLOMAX) 0.4 MG CAPS capsule Take 1 capsule (0.4 mg  total) by mouth daily.  90 capsule  3  . warfarin (COUMADIN) 2.5 MG tablet Take as directed by the [facility]  90 tablet  0   No current facility-administered medications on file prior to visit.    BP 120/68  Temp(Src) 98.8 F (37.1 C) (Oral)  Wt 200 lb (90.719 kg)chart    Objective:   Physical Exam  Constitutional: He appears well-developed and well-nourished. He is active.  Neck: Normal range of motion. Neck supple.  Cardiovascular: Normal rate, regular rhythm and normal heart sounds.   Pulmonary/Chest: Effort normal and  breath sounds normal.  Neurological: He is alert.  Skin: Skin is warm, dry and intact.  Psychiatric: He has a normal mood and affect. His speech is normal and behavior is normal. Thought content normal.          Assessment & Plan:  Shawn [name] was seen today for follow-up.  Diagnoses and associated orders for this visit:  Neck abscess  Atrial fibrillation  Other Orders - POCT INR   Education: Continue to medication as prescribed and make no changes to Coumadin as INR is in range. Grief counseling for dealing with changes of life related to Wifes recent diagnosis of pancreatic cancer.   Follow up: 6 weeks for INR recheck.

## 2013-08-12 NOTE — Patient Instructions (Addendum)
Continue 2.5 mg all days except 5 mg on M/W/F.  Re-check 6 weeks.  Anticoagulation Dose Instructions as of 08/12/2013     Dorene Grebe Tue Wed Thu Fri Sat   New Dose 2.5 mg 5 mg 2.5 mg 5 mg 2.5 mg 5 mg 2.5 mg    Description       Continue 2.5 mg all days except 5 mg on M/W/F.  Re-check 6 weeks.        Atrial Fibrillation Atrial fibrillation is a type of irregular heart rhythm (arrhythmia). During atrial fibrillation, the upper chambers of the heart (atria) quiver continuously in a chaotic pattern. This causes an irregular and often rapid heart rate.  Atrial fibrillation is the result of the heart becoming overloaded with disorganized signals that tell it to beat. These signals are normally released one at a time by a part of the right atrium called the sinoatrial node. They then travel from the atria to the lower chambers of the heart (ventricles), causing the atria and ventricles to contract and pump blood as they pass. In atrial fibrillation, parts of the atria outside of the sinoatrial node also release these signals. This results in two problems. First, the atria receive so many signals that they do not have time to fully contract. Second, the ventricles, which can only receive one signal at a time, beat irregularly and out of rhythm with the atria.  There are three types of atrial fibrillation:   Paroxysmal Paroxysmal atrial fibrillation starts suddenly and stops on its own within a week.   Persistent Persistent atrial fibrillation lasts for more than a week. It may stop on its own or with treatment.   Permanent Permanent atrial fibrillation does not go away. Episodes of atrial fibrillation may lead to permanent atrial fibrillation.  Atrial fibrillation can prevent your heart from pumping blood normally. It increases your risk of stroke and can lead to heart failure.  CAUSES   Heart conditions, including a heart attack, heart failure, coronary artery disease, and heart valve conditions.    Inflammation of the sac that surrounds the heart (pericarditis).   Blockage of an artery in the lungs (pulmonary embolism).   Pneumonia or other infections.   Chronic lung disease.   Thyroid problems, especially if the thyroid is overactive (hyperthyroidism).   Caffeine, excessive alcohol use, and use of some illegal drugs.   Use of some medications, including certain decongestants and diet pills.   Heart surgery.   Birth defects.  Sometimes, no cause can be found. When this happens, the atrial fibrillation is called lone atrial fibrillation. The risk of complications from atrial fibrillation increases if you have lone atrial fibrillation and you are age 77 years or older. RISK FACTORS  Heart failure.  Coronary artery disease  Diabetes mellitus.   High blood pressure (hypertension).   Obesity.   Other arrhythmias.   Increased age. SYMPTOMS   A feeling that your heart is beating rapidly or irregularly.   A feeling of discomfort or pain in your chest.   Shortness of breath.   Sudden lightheadedness or weakness.   Getting tired easily when exercising.   Urinating more often than normal (mainly when atrial fibrillation first begins).  In paroxysmal atrial fibrillation, symptoms may start and suddenly stop. DIAGNOSIS  Your caregiver may be able to detect atrial fibrillation when taking your pulse. Usually, testing is needed to diagnosis atrial fibrillation. Tests may include:   Electrocardiography. During this test, the electrical impulses of your heart are  recorded while you are lying down.   Echocardiography. During echocardiography, sound waves are used to evaluate how blood flows through your heart.   Stress test. There is more than one type of stress test. If a stress test is needed, ask your caregiver about which type is best for you.   Chest X-ray exam.   Blood tests.   Computed tomography (CT).  TREATMENT   Treating any  underlying conditions. For example, if you have an overactive thyroid, treating the condition may correct atrial fibrillation.   Medication. Medications may be given to control a rapid heart rate or to prevent blood clots, heart failure, or a stroke.   Procedure to correct the rhythm of the heart:  Electrical cardioversion. During electrical cardioversion, a controlled, low-energy shock is delivered to the heart through your skin. If you have chest pain, very low pressure blood pressure, or sudden heart failure, this procedure may need to be done as an emergency.  Catheter ablation. During this procedure, heart tissues that send the signals that cause atrial fibrillation are destroyed.  Maze or minimaze procedure. During this surgery, thin lines of heart tissue that carry the abnormal signals are destroyed. The maze procedure is an open-heart surgery. The minimaze procedure is a minimally invasive surgery. This means that small cuts are made to access the heart instead of a large opening.  Pulmonary venous isolation. During this surgery, tissue around the veins that carry blood from the lungs (pulmonary veins) is destroyed. This tissue is thought to carry the abnormal signals. HOME CARE INSTRUCTIONS   Take medications as directed by your caregiver.  Only take medications that your caregiver approves. Some medications can make atrial fibrillation worse or recur.  If blood thinners were prescribed by your caregiver, take them exactly as directed. Too much can cause bleeding. Too little and you will not have the needed protection against stroke and other problems.  Perform blood tests at home if directed by your caregiver.  Perform blood tests exactly as directed.   Quit smoking if you smoke.   Do not drink alcohol.   Do not drink caffeinated beverages such as coffee, soda, and some teas. You may drink decaffeinated coffee, soda, or tea.   Maintain a healthy weight. Do not use diet  pills unless your caregiver approves. They may make heart problems worse.   Follow diet instructions as directed by your caregiver.   Exercise regularly as directed by your caregiver.   Keep all follow-up appointments. PREVENTION  The following substances can cause atrial fibrillation to recur:   Caffeinated beverages.   Alcohol.   Certain medications, especially those used for breathing problems.   Certain herbs and herbal medications, such as those containing ephedra or ginseng.  Illegal drugs such as cocaine and amphetamines. Sometimes medications are given to prevent atrial fibrillation from recurring. Proper treatment of any underlying condition is also important in helping prevent recurrence.  SEEK MEDICAL CARE IF:  You notice a change in the rate, rhythm, or strength of your heartbeat.   You suddenly begin urinating more frequently.   You tire more easily when exerting yourself or exercising.  SEEK IMMEDIATE MEDICAL CARE IF:   You develop chest pain, abdominal pain, sweating, or weakness.  You feel sick to your stomach (nauseous).  You develop shortness of breath.  You suddenly develop swollen feet and ankles.  You feel dizzy.  You face or limbs feel numb or weak.  There is a change in your vision or speech.  MAKE SURE YOU:   Understand these instructions.  Will watch your condition.  Will get help right away if you are not doing well or get worse. Document Released: 05/21/2005 Document Revised: 09/15/2012 Document Reviewed: 07/01/2012 [employer] Patient Information 2014 [employer].

## 2013-08-12 NOTE — Progress Notes (Signed)
Pre visit review using our clinic review tool, if applicable. No additional management support is needed unless otherwise documented below in the visit note. 

## 2013-08-24 ENCOUNTER — Ambulatory Visit (INDEPENDENT_AMBULATORY_CARE_PROVIDER_SITE_OTHER): Payer: MEDICARE | Admitting: *Deleted

## 2013-08-24 DIAGNOSIS — I4891 Unspecified atrial fibrillation: Secondary | ICD-10-CM

## 2013-08-24 LAB — MDC_IDC_ENUM_SESS_TYPE_REMOTE
Battery Voltage: 2.89 V
Brady Statistic AP VP Percent: 1.2 %
Brady Statistic AS VP Percent: 48 %
Lead Channel Impedance Value: 448 Ohm
Lead Channel Impedance Value: 544 Ohm
Lead Channel Setting Pacing Amplitude: 2 V
Lead Channel Setting Pacing Amplitude: 2.5 V
Lead Channel Setting Pacing Pulse Width: 0.4 ms
MDC IDC SET LEADCHNL RV SENSING SENSITIVITY: 0.9 mV
MDC IDC SET ZONE DETECTION INTERVAL: 350 ms
MDC IDC SET ZONE DETECTION INTERVAL: 400 ms
MDC IDC STAT BRADY AP VS PERCENT: 0.1 % — AB
MDC IDC STAT BRADY AS VS PERCENT: 50.7 %

## 2013-09-02 ENCOUNTER — Encounter: Payer: Self-pay | Admitting: *Deleted

## 2013-09-10 ENCOUNTER — Encounter: Payer: Self-pay | Admitting: Internal Medicine

## 2013-09-21 NOTE — Telephone Encounter (Signed)
Error

## 2013-09-24 ENCOUNTER — Ambulatory Visit (INDEPENDENT_AMBULATORY_CARE_PROVIDER_SITE_OTHER): Payer: MEDICARE | Admitting: *Deleted

## 2013-09-24 ENCOUNTER — Ambulatory Visit: Payer: MEDICARE

## 2013-09-24 ENCOUNTER — Ambulatory Visit (INDEPENDENT_AMBULATORY_CARE_PROVIDER_SITE_OTHER): Payer: MEDICARE | Admitting: General Practice

## 2013-09-24 DIAGNOSIS — Z5181 Encounter for therapeutic drug level monitoring: Secondary | ICD-10-CM | POA: Insufficient documentation

## 2013-09-24 DIAGNOSIS — I4891 Unspecified atrial fibrillation: Secondary | ICD-10-CM

## 2013-09-24 LAB — POCT INR: INR: 2.9

## 2013-09-24 NOTE — Progress Notes (Signed)
Pre visit review using our clinic review tool, if applicable. No additional management support is needed unless otherwise documented below in the visit note. 

## 2013-10-01 LAB — MDC_IDC_ENUM_SESS_TYPE_REMOTE
Battery Voltage: 2.89 V
Brady Statistic AP VP Percent: 2.2 %
Brady Statistic AP VS Percent: 0.1 %
Brady Statistic AS VP Percent: 34.02 %
Brady Statistic RV Percent Paced: 36.22 %
Date Time Interrogation Session: 20150423112127
Lead Channel Impedance Value: 528 Ohm
Lead Channel Setting Pacing Amplitude: 2 V
Lead Channel Setting Pacing Amplitude: 2.5 V
Lead Channel Setting Sensing Sensitivity: 0.9 mV
MDC IDC MSMT LEADCHNL RV IMPEDANCE VALUE: 456 Ohm
MDC IDC SET LEADCHNL RV PACING PULSEWIDTH: 0.4 ms
MDC IDC STAT BRADY AS VS PERCENT: 63.68 %
MDC IDC STAT BRADY RA PERCENT PACED: 2.3 %
Zone Setting Detection Interval: 350 ms
Zone Setting Detection Interval: 400 ms

## 2013-10-05 NOTE — Addendum Note (Signed)
Addended by: Carlus Pavlov on: 10/05/2013 09:30 PM   Modules accepted: Level of Service

## 2013-10-06 ENCOUNTER — Encounter: Payer: Self-pay | Admitting: Cardiology

## 2013-10-15 ENCOUNTER — Encounter: Payer: Self-pay | Admitting: Internal Medicine

## 2013-10-27 ENCOUNTER — Encounter: Payer: MEDICARE | Admitting: *Deleted

## 2013-10-27 ENCOUNTER — Telehealth: Payer: Self-pay | Admitting: Cardiology

## 2013-10-27 NOTE — Telephone Encounter (Signed)
LMOVM reminding pt to send remote transmission.   

## 2013-10-30 ENCOUNTER — Encounter: Payer: Self-pay | Admitting: Cardiology

## 2013-11-02 ENCOUNTER — Ambulatory Visit (INDEPENDENT_AMBULATORY_CARE_PROVIDER_SITE_OTHER): Payer: MEDICARE | Admitting: *Deleted

## 2013-11-02 DIAGNOSIS — I4891 Unspecified atrial fibrillation: Secondary | ICD-10-CM

## 2013-11-02 NOTE — Progress Notes (Signed)
Remote pacemaker transmission.   

## 2013-11-04 ENCOUNTER — Telehealth: Payer: Self-pay | Admitting: Cardiology

## 2013-11-04 LAB — MDC_IDC_ENUM_SESS_TYPE_REMOTE
Battery Voltage: 2.88 V
Brady Statistic AP VS Percent: 0.1 %
Brady Statistic AS VS Percent: 55.08 %
Brady Statistic RV Percent Paced: 44.82 %
Date Time Interrogation Session: 20150601120056
Lead Channel Impedance Value: 440 Ohm
Lead Channel Sensing Intrinsic Amplitude: 2.775
Lead Channel Setting Pacing Amplitude: 2.5 V
Lead Channel Setting Pacing Pulse Width: 0.4 ms
Lead Channel Setting Sensing Sensitivity: 0.9 mV
MDC IDC MSMT LEADCHNL RA IMPEDANCE VALUE: 520 Ohm
MDC IDC SET LEADCHNL RA PACING AMPLITUDE: 2 V
MDC IDC SET ZONE DETECTION INTERVAL: 400 ms
MDC IDC STAT BRADY AP VP PERCENT: 2.68 %
MDC IDC STAT BRADY AS VP PERCENT: 42.15 %
MDC IDC STAT BRADY RA PERCENT PACED: 2.77 %
Zone Setting Detection Interval: 350 ms

## 2013-11-04 NOTE — Telephone Encounter (Signed)
LMOVM informing pt that transmission was received.  

## 2013-11-04 NOTE — Telephone Encounter (Signed)
New Message  Pt called. Requests a call back to confirm that his transmission was received. Please call

## 2013-11-04 NOTE — Addendum Note (Signed)
Addended by: Kendell Bane on: 11/04/2013 09:39 AM   Modules accepted: Level of Service

## 2013-11-05 ENCOUNTER — Ambulatory Visit (INDEPENDENT_AMBULATORY_CARE_PROVIDER_SITE_OTHER): Payer: 59 | Admitting: General Practice

## 2013-11-05 DIAGNOSIS — Z5181 Encounter for therapeutic drug level monitoring: Secondary | ICD-10-CM

## 2013-11-05 DIAGNOSIS — I4891 Unspecified atrial fibrillation: Secondary | ICD-10-CM

## 2013-11-05 LAB — POCT INR: INR: 2.7

## 2013-11-05 NOTE — Progress Notes (Signed)
Pre visit review using our clinic review tool, if applicable. No additional management support is needed unless otherwise documented below in the visit note. 

## 2013-11-13 ENCOUNTER — Encounter: Payer: Self-pay | Admitting: Cardiology

## 2013-11-16 ENCOUNTER — Telehealth: Payer: Self-pay | Admitting: Internal Medicine

## 2013-11-16 MED ORDER — METOPROLOL SUCCINATE ER 50 MG PO TB24: ORAL_TABLET | ORAL | Status: DC

## 2013-11-16 NOTE — Telephone Encounter (Signed)
Pt has run out of metoprolol succinate (TOPROL-XL) 50 MG 24 hr tablet Pt was seen in Jan for cpe. Needs rx sent to [facility] They gave pt one day to get him through. Can you send in asap>?

## 2013-11-16 NOTE — Telephone Encounter (Signed)
Rx sent to pharmacy. Pt aware rx sent to pharmacy.

## 2013-11-19 NOTE — Telephone Encounter (Signed)
opened in error/ spole w/ [facility]

## 2013-11-26 ENCOUNTER — Encounter: Payer: Self-pay | Admitting: Internal Medicine

## 2013-12-03 ENCOUNTER — Encounter: Payer: Self-pay | Admitting: Internal Medicine

## 2013-12-03 ENCOUNTER — Ambulatory Visit (INDEPENDENT_AMBULATORY_CARE_PROVIDER_SITE_OTHER): Payer: MEDICARE | Admitting: *Deleted

## 2013-12-03 DIAGNOSIS — I4891 Unspecified atrial fibrillation: Secondary | ICD-10-CM

## 2013-12-03 NOTE — Progress Notes (Signed)
Remote pacemaker transmission.   

## 2013-12-14 LAB — MDC_IDC_ENUM_SESS_TYPE_REMOTE
Battery Voltage: 2.88 V
Brady Statistic AP VP Percent: 3.26 %
Brady Statistic AS VS Percent: 55.2 %
Brady Statistic RA Percent Paced: 3.38 %
Brady Statistic RV Percent Paced: 44.69 %
Date Time Interrogation Session: 20150702123636
Lead Channel Impedance Value: 424 Ohm
Lead Channel Impedance Value: 496 Ohm
Lead Channel Setting Pacing Amplitude: 2.5 V
Lead Channel Setting Pacing Pulse Width: 0.4 ms
MDC IDC SET LEADCHNL RA PACING AMPLITUDE: 2 V
MDC IDC SET LEADCHNL RV SENSING SENSITIVITY: 0.9 mV
MDC IDC SET ZONE DETECTION INTERVAL: 350 ms
MDC IDC SET ZONE DETECTION INTERVAL: 400 ms
MDC IDC STAT BRADY AP VS PERCENT: 0.11 %
MDC IDC STAT BRADY AS VP PERCENT: 41.42 %

## 2013-12-17 ENCOUNTER — Ambulatory Visit (INDEPENDENT_AMBULATORY_CARE_PROVIDER_SITE_OTHER): Payer: MEDICARE | Admitting: General Practice

## 2013-12-17 DIAGNOSIS — Z5181 Encounter for therapeutic drug level monitoring: Secondary | ICD-10-CM

## 2013-12-17 DIAGNOSIS — I4891 Unspecified atrial fibrillation: Secondary | ICD-10-CM

## 2013-12-17 LAB — POCT INR: INR: 2.6

## 2013-12-17 NOTE — Progress Notes (Signed)
Pre visit review using our clinic review tool, if applicable. No additional management support is needed unless otherwise documented below in the visit note. 

## 2013-12-21 ENCOUNTER — Encounter: Payer: Self-pay | Admitting: Cardiology

## 2013-12-30 ENCOUNTER — Other Ambulatory Visit: Payer: Self-pay | Admitting: Internal Medicine

## 2014-01-05 ENCOUNTER — Telehealth: Payer: Self-pay | Admitting: Cardiology

## 2014-01-05 ENCOUNTER — Ambulatory Visit (INDEPENDENT_AMBULATORY_CARE_PROVIDER_SITE_OTHER): Payer: MEDICARE | Admitting: *Deleted

## 2014-01-05 DIAGNOSIS — I495 Sick sinus syndrome: Secondary | ICD-10-CM

## 2014-01-05 DIAGNOSIS — I4891 Unspecified atrial fibrillation: Secondary | ICD-10-CM

## 2014-01-05 NOTE — Progress Notes (Signed)
Remote pacemaker transmission.   

## 2014-01-05 NOTE — Telephone Encounter (Signed)
LMOVM reminding pt to send remote transmission.   

## 2014-01-06 LAB — MDC_IDC_ENUM_SESS_TYPE_REMOTE
Battery Voltage: 2.88 V
Brady Statistic AP VP Percent: 2.56 %
Brady Statistic RA Percent Paced: 2.64 %
Brady Statistic RV Percent Paced: 53.05 %
Lead Channel Setting Pacing Amplitude: 2 V
Lead Channel Setting Pacing Pulse Width: 0.4 ms
Lead Channel Setting Sensing Sensitivity: 0.9 mV
MDC IDC MSMT LEADCHNL RA IMPEDANCE VALUE: 512 Ohm
MDC IDC MSMT LEADCHNL RV IMPEDANCE VALUE: 456 Ohm
MDC IDC SESS DTM: 20150804173942
MDC IDC SET LEADCHNL RV PACING AMPLITUDE: 2.5 V
MDC IDC SET ZONE DETECTION INTERVAL: 350 ms
MDC IDC STAT BRADY AP VS PERCENT: 0.08 %
MDC IDC STAT BRADY AS VP PERCENT: 50.48 %
MDC IDC STAT BRADY AS VS PERCENT: 46.87 %
Zone Setting Detection Interval: 400 ms

## 2014-01-08 ENCOUNTER — Encounter: Payer: Self-pay | Admitting: Cardiology

## 2014-01-15 ENCOUNTER — Encounter: Payer: Self-pay | Admitting: Internal Medicine

## 2014-01-28 ENCOUNTER — Ambulatory Visit (INDEPENDENT_AMBULATORY_CARE_PROVIDER_SITE_OTHER): Payer: MEDICARE | Admitting: Family

## 2014-01-28 ENCOUNTER — Ambulatory Visit: Payer: 59

## 2014-01-28 DIAGNOSIS — I482 Chronic atrial fibrillation, unspecified: Secondary | ICD-10-CM

## 2014-01-28 DIAGNOSIS — I4891 Unspecified atrial fibrillation: Secondary | ICD-10-CM

## 2014-01-28 DIAGNOSIS — Z5181 Encounter for therapeutic drug level monitoring: Secondary | ICD-10-CM

## 2014-01-28 LAB — POCT INR: INR: 4

## 2014-01-28 NOTE — Patient Instructions (Signed)
Hold Coumadin Friday and Saturday. Continue 2.5 mg all days except 5 mg on M/W/F.   Anticoagulation Dose Instructions as of 01/28/2014     Dorene Grebe Tue Wed Thu Fri Sat   New Dose 2.5 mg 5 mg 2.5 mg 5 mg 2.5 mg 5 mg 2.5 mg    Description       Hold Coumadin Friday and Saturday. Continue 2.5 mg all days except 5 mg on M/W/F.

## 2014-02-05 ENCOUNTER — Ambulatory Visit: Payer: MEDICARE | Admitting: *Deleted

## 2014-02-05 DIAGNOSIS — I4891 Unspecified atrial fibrillation: Secondary | ICD-10-CM

## 2014-02-05 NOTE — Progress Notes (Signed)
Remote pacemaker transmission.   

## 2014-02-09 ENCOUNTER — Ambulatory Visit (INDEPENDENT_AMBULATORY_CARE_PROVIDER_SITE_OTHER): Payer: MEDICARE | Admitting: Family

## 2014-02-09 DIAGNOSIS — Z5181 Encounter for therapeutic drug level monitoring: Secondary | ICD-10-CM

## 2014-02-09 LAB — POCT INR: INR: 2.7

## 2014-02-09 NOTE — Patient Instructions (Signed)
Continue 2.5 mg all days except 5 mg on M/W/F.  Recheck in 4 weeks   Anticoagulation Dose Instructions as of 02/09/2014     Dorene Grebe Tue Wed Thu Fri Sat   New Dose 2.5 mg 5 mg 2.5 mg 5 mg 2.5 mg 5 mg 2.5 mg    Description       Continue 2.5 mg all days except 5 mg on M/W/F.  Recheck in 4 weeks

## 2014-02-15 LAB — MDC_IDC_ENUM_SESS_TYPE_REMOTE
Battery Voltage: 2.86 V
Brady Statistic AP VP Percent: 2.21 %
Brady Statistic AP VS Percent: 0.04 %
Brady Statistic AS VS Percent: 48.45 %
Brady Statistic RA Percent Paced: 2.26 %
Brady Statistic RV Percent Paced: 51.51 %
Date Time Interrogation Session: 20150904114345
Lead Channel Impedance Value: 424 Ohm
Lead Channel Impedance Value: 448 Ohm
Lead Channel Setting Pacing Amplitude: 2 V
MDC IDC SET LEADCHNL RV PACING AMPLITUDE: 2.5 V
MDC IDC SET LEADCHNL RV PACING PULSEWIDTH: 0.4 ms
MDC IDC SET LEADCHNL RV SENSING SENSITIVITY: 0.9 mV
MDC IDC SET ZONE DETECTION INTERVAL: 400 ms
MDC IDC STAT BRADY AS VP PERCENT: 49.29 %
Zone Setting Detection Interval: 350 ms

## 2014-02-23 ENCOUNTER — Encounter: Payer: Self-pay | Admitting: Cardiology

## 2014-02-24 ENCOUNTER — Encounter: Payer: Self-pay | Admitting: Internal Medicine

## 2014-03-08 ENCOUNTER — Ambulatory Visit: Payer: MEDICARE

## 2014-03-16 ENCOUNTER — Ambulatory Visit (INDEPENDENT_AMBULATORY_CARE_PROVIDER_SITE_OTHER): Payer: MEDICARE | Admitting: Family

## 2014-03-16 DIAGNOSIS — Z5181 Encounter for therapeutic drug level monitoring: Secondary | ICD-10-CM

## 2014-03-16 DIAGNOSIS — Z23 Encounter for immunization: Secondary | ICD-10-CM

## 2014-03-16 LAB — POCT INR: INR: 3

## 2014-03-16 NOTE — Patient Instructions (Signed)
Continue 2.5 mg all days except 5 mg on M/W/F.  Recheck in 4 weeks   Anticoagulation Dose Instructions as of 03/16/2014     Dorene Grebe Tue Wed Thu Fri Sat   New Dose 2.5 mg 5 mg 2.5 mg 5 mg 2.5 mg 5 mg 2.5 mg    Description       Continue 2.5 mg all days except 5 mg on M/W/F.  Recheck in 4 weeks

## 2014-03-18 ENCOUNTER — Ambulatory Visit (INDEPENDENT_AMBULATORY_CARE_PROVIDER_SITE_OTHER): Payer: MEDICARE | Admitting: *Deleted

## 2014-03-18 DIAGNOSIS — I4891 Unspecified atrial fibrillation: Secondary | ICD-10-CM | POA: Diagnosis not present

## 2014-03-19 NOTE — Progress Notes (Signed)
Remote pacemaker transmission.   

## 2014-03-29 ENCOUNTER — Other Ambulatory Visit: Payer: Self-pay | Admitting: *Deleted

## 2014-03-29 ENCOUNTER — Encounter: Payer: Self-pay | Admitting: Cardiology

## 2014-03-29 ENCOUNTER — Ambulatory Visit (INDEPENDENT_AMBULATORY_CARE_PROVIDER_SITE_OTHER): Payer: MEDICARE | Admitting: Cardiology

## 2014-03-29 VITALS — BP 122/70 | HR 78 | Ht 68.0 in | Wt 202.6 lb

## 2014-03-29 DIAGNOSIS — I481 Persistent atrial fibrillation: Secondary | ICD-10-CM

## 2014-03-29 DIAGNOSIS — I4819 Other persistent atrial fibrillation: Secondary | ICD-10-CM

## 2014-03-29 DIAGNOSIS — I2510 Atherosclerotic heart disease of native coronary artery without angina pectoris: Secondary | ICD-10-CM

## 2014-03-29 MED ORDER — NITROGLYCERIN 0.4 MG SL SUBL: 0.4000 mg | SUBLINGUAL_TABLET | SUBLINGUAL | Status: DC | PRN

## 2014-03-29 NOTE — Patient Instructions (Signed)
Your physician recommends that you schedule a follow-up appointment in: one year with Dr. Hochrein  

## 2014-03-29 NOTE — Progress Notes (Signed)
HPI The patient presents for yearly followup. Since last saw him he has done OK from a cardiovascular standpoint.  Unfortunately, his wife died since I last saw him.  He is coping with this fairly well. He's not having any cardiac symptoms. He denies any chest pressure, neck or arm discomfort. He has had no palpitations, presyncope or syncope. He's has done well on warfarin. He denies any significant weight gain or weight loss. He has no PND or orthopnea. He doesn't exercise but he does his activities of daily living like yard work.    No Known Allergies  Current Outpatient Prescriptions  Medication Sig Dispense Refill  . aspirin EC 81 MG tablet Take 81 mg by mouth daily.      Marland Kitchen atorvastatin (LIPITOR) 20 MG tablet Take 1 tablet by mouth  every day  90 tablet  3  . diltiazem (CARDIZEM LA) 240 MG 24 hr tablet Take 1 tablet (240 mg  total) by mouth daily.  90 tablet  3  . fluticasone (FLONASE) 50 MCG/ACT nasal spray Place 2 sprays into the nose daily.        . metoprolol succinate (TOPROL-XL) 50 MG 24 hr tablet One and one half tablets once daily  135 tablet  11  . nitroGLYCERIN (NITROSTAT) 0.4 MG SL tablet Place 1 tablet (0.4 mg total) under the tongue every 5 (five) minutes as needed.  25 tablet  2  . omeprazole (PRILOSEC) 20 MG capsule Take 1 capsule (20 mg total) by mouth daily.  90 capsule  3  . tamsulosin (FLOMAX) 0.4 MG CAPS capsule Take 1 capsule (0.4 mg  total) by mouth daily.  90 capsule  3  . warfarin (COUMADIN) 2.5 MG tablet Take as directed by the [facility]  90 tablet  0  . colchicine 0.6 MG tablet Take 1 tablet (0.6 mg total) by mouth 2 (two) times daily.  30 tablet  0   No current facility-administered medications for this visit.    Past Medical History  Diagnosis Date  . Actinic keratosis   . ALLERGIC RHINITIS   . Atrial fibrillation   . CAD, NATIVE VESSEL   . Cellulitis and abscess of trunk   . HYPERLIPIDEMIA   . HYPERTENSION   . OTH MALIG NEOPLASM SKIN  OTH\T\UNSPEC PARTS FACE   . PACEMAKER, PERMANENT 2008  . GERD (gastroesophageal reflux disease)     Past Surgical History  Procedure Laterality Date  . Cystectomy      from right side of face.  . Cataract extraction w/ intraocular lens  implant, bilateral  2012  . Back surgery  2000    "for bulging discs"  . Insert / replace / remove pacemaker  2008    with lead rvision and microperforation  . Coronary angioplasty with stent placement  2008  . Coronary angioplasty with stent placement  05/14/11    ROS:  Right knee pain.  Otherwise as stated in the HPI and negative for all other systems.  PHYSICAL EXAM BP 122/70  Pulse 78  Ht 5\' 8"  (1.727 m)  Wt 202 lb 9.6 oz (91.899 kg)  BMI 30.81 kg/m2 GENERAL:  Well appearing NECK:  No jugular venous distention, waveform within normal limits, carotid upstroke brisk and symmetric, no bruits, no thyromegaly LUNGS:  Clear to auscultation bilaterally BACK:  No CVA tenderness CHEST:  Pacer scar HEART:  PMI not displaced or sustained,S1 and S2 within normal limits, no S3, no clicks, no rubs, no murmurs, irregular ABD:  Flat, positive bowel sounds normal in frequency in pitch, no bruits, no rebound, no guarding, no midline pulsatile mass, no hepatomegaly, no splenomegaly EXT:  2 plus pulses throughout, mild bilateral lower extremityedema, no cyanosis no clubbing, his left toe is slightly swollen and red  EKG:  Atrial fibrillation, ventricular pacing with fusion complexes, no acute ST-T wave changes. 03/29/2014  ASSESSMENT AND PLAN  ATRIAL FIBRILLATION -  The patient tolerates this rhythm with rate control and anticoagulation. He is not interested in switching to a novel oral anticoagulant.  He will continue on warfarin.    CAD, NATIVE VESSEL -  He had a stress perfusion study in 2012 demonstrated no high risk findings.  He has had no new symptoms since then. He will continue with risk reduction.  PACEMAKER, PERMANENT -  He has scheduled  follow up for this.  No change in therapy is indicated.   DYSLIPIDEMIA - His lipids are OK as below.  No change in therapy is planned.  Lab Results  Component Value Date   CHOL 128 06/17/2013   TRIG 129.0 06/17/2013   HDL 38.40* 06/17/2013   LDLCALC 64 06/17/2013

## 2014-03-30 NOTE — Addendum Note (Signed)
Addended by: Dolan Amen on: 03/30/2014 08:58 AM   Modules accepted: Orders

## 2014-04-01 ENCOUNTER — Telehealth: Payer: Self-pay | Admitting: Internal Medicine

## 2014-04-01 MED ORDER — DILTIAZEM HCL ER COATED BEADS 240 MG PO TB24
ORAL_TABLET | ORAL | Status: DC
Start: 2014-04-01 — End: 2014-07-05

## 2014-04-01 NOTE — Telephone Encounter (Signed)
[facility] is requesting re-fill on diltiazem (CARDIZEM LA) 240 MG 24 hr tablet

## 2014-04-01 NOTE — Telephone Encounter (Signed)
90 day rx sent in electronically.  Pt has an appt with [facility] in December to establish care

## 2014-04-07 LAB — MDC_IDC_ENUM_SESS_TYPE_REMOTE
Battery Voltage: 2.86 V
Brady Statistic AP VP Percent: 1.7 %
Brady Statistic AS VS Percent: 50.01 %
Brady Statistic RA Percent Paced: 1.74 %
Brady Statistic RV Percent Paced: 49.94 %
Date Time Interrogation Session: 20151015130839
Lead Channel Impedance Value: 480 Ohm
Lead Channel Setting Pacing Amplitude: 2 V
Lead Channel Setting Pacing Pulse Width: 0.4 ms
Lead Channel Setting Sensing Sensitivity: 0.9 mV
MDC IDC MSMT LEADCHNL RV IMPEDANCE VALUE: 440 Ohm
MDC IDC MSMT LEADCHNL RV SENSING INTR AMPL: 2.8 mV
MDC IDC SET LEADCHNL RV PACING AMPLITUDE: 2.5 V
MDC IDC STAT BRADY AP VS PERCENT: 0.05 %
MDC IDC STAT BRADY AS VP PERCENT: 48.24 %
Zone Setting Detection Interval: 350 ms
Zone Setting Detection Interval: 400 ms

## 2014-04-22 ENCOUNTER — Encounter: Payer: Self-pay | Admitting: Cardiology

## 2014-04-26 ENCOUNTER — Ambulatory Visit (INDEPENDENT_AMBULATORY_CARE_PROVIDER_SITE_OTHER): Payer: MEDICARE | Admitting: Family

## 2014-04-26 ENCOUNTER — Ambulatory Visit: Payer: MEDICARE | Admitting: Family

## 2014-04-26 ENCOUNTER — Telehealth: Payer: Self-pay | Admitting: Family

## 2014-04-26 DIAGNOSIS — I4819 Other persistent atrial fibrillation: Secondary | ICD-10-CM

## 2014-04-26 DIAGNOSIS — Z5181 Encounter for therapeutic drug level monitoring: Secondary | ICD-10-CM

## 2014-04-26 DIAGNOSIS — I481 Persistent atrial fibrillation: Secondary | ICD-10-CM

## 2014-04-26 LAB — POCT INR: INR: 3

## 2014-04-26 NOTE — Telephone Encounter (Signed)
Continue 2.5 mg all days except 5 mg on M/W/F.  Recheck in 4 weeks. I will check at 12/14 office visit

## 2014-04-26 NOTE — Patient Instructions (Signed)
Continue 2.5 mg all days except 5 mg on M/W/F.  Recheck in 4 weeks  Anticoagulation Dose Instructions as of 04/26/2014      Shawn [name] Tue Wed Thu Fri Sat   New Dose 2.5 mg 5 mg 2.5 mg 5 mg 2.5 mg 5 mg 2.5 mg    Description        Continue 2.5 mg all days except 5 mg on M/W/F.  Recheck in 4 weeks

## 2014-04-26 NOTE — Patient Instructions (Signed)
Continue 2.5 mg all days except 5 mg on M/W/F.  Recheck in 4 weeks  Anticoagulation Dose Instructions as of 04/26/2014      Dorene Grebe Tue Wed Thu Fri Sat   New Dose 2.5 mg 5 mg 2.5 mg 5 mg 2.5 mg 5 mg 2.5 mg    Description        Continue 2.5 mg all days except 5 mg on M/W/F.  Recheck in 4 weeks

## 2014-04-26 NOTE — Telephone Encounter (Signed)
Pt aware.

## 2014-05-03 ENCOUNTER — Encounter: Payer: Self-pay | Admitting: Internal Medicine

## 2014-05-04 ENCOUNTER — Ambulatory Visit: Payer: MEDICARE | Admitting: Family

## 2014-05-12 ENCOUNTER — Encounter (HOSPITAL_COMMUNITY): Payer: Self-pay | Admitting: Cardiology

## 2014-05-17 ENCOUNTER — Encounter: Payer: Self-pay | Admitting: Family

## 2014-05-17 ENCOUNTER — Ambulatory Visit (INDEPENDENT_AMBULATORY_CARE_PROVIDER_SITE_OTHER): Payer: MEDICARE | Admitting: Family

## 2014-05-17 ENCOUNTER — Ambulatory Visit (INDEPENDENT_AMBULATORY_CARE_PROVIDER_SITE_OTHER)
Admission: RE | Admit: 2014-05-17 | Discharge: 2014-05-17 | Disposition: A | Discharge status: Home / Self Care | Payer: MEDICARE | Source: Ambulatory Visit | Attending: Family | Admitting: Family

## 2014-05-17 ENCOUNTER — Ambulatory Visit: Payer: MEDICARE | Admitting: Family

## 2014-05-17 ENCOUNTER — Other Ambulatory Visit: Payer: Self-pay | Admitting: Family

## 2014-05-17 VITALS — BP 118/78 | HR 91 | Ht 66.0 in | Wt 202.0 lb

## 2014-05-17 DIAGNOSIS — Z125 Encounter for screening for malignant neoplasm of prostate: Secondary | ICD-10-CM

## 2014-05-17 DIAGNOSIS — E7800 Pure hypercholesterolemia, unspecified: Secondary | ICD-10-CM

## 2014-05-17 DIAGNOSIS — M25561 Pain in right knee: Secondary | ICD-10-CM

## 2014-05-17 DIAGNOSIS — Z5181 Encounter for therapeutic drug level monitoring: Secondary | ICD-10-CM

## 2014-05-17 DIAGNOSIS — I10 Essential (primary) hypertension: Secondary | ICD-10-CM

## 2014-05-17 DIAGNOSIS — I482 Chronic atrial fibrillation, unspecified: Secondary | ICD-10-CM

## 2014-05-17 DIAGNOSIS — I4819 Other persistent atrial fibrillation: Secondary | ICD-10-CM

## 2014-05-17 DIAGNOSIS — I2510 Atherosclerotic heart disease of native coronary artery without angina pectoris: Secondary | ICD-10-CM

## 2014-05-17 DIAGNOSIS — I481 Persistent atrial fibrillation: Secondary | ICD-10-CM

## 2014-05-17 DIAGNOSIS — N400 Enlarged prostate without lower urinary tract symptoms: Secondary | ICD-10-CM

## 2014-05-17 LAB — LIPID PANEL
CHOL/HDL RATIO: 3
Cholesterol: 101 mg/dL (ref 0–200)
HDL: 33 mg/dL — ABNORMAL LOW (ref >39.00)
LDL Cholesterol: 52 mg/dL (ref 0–99)
NONHDL: 68
Triglycerides: 79 mg/dL (ref 0.0–149.0)
VLDL: 15.8 mg/dL (ref 0.0–40.0)

## 2014-05-17 LAB — HEPATIC FUNCTION PANEL
ALT: 21 U/L (ref 0–53)
AST: 20 U/L (ref 0–37)
Albumin: 3.9 g/dL (ref 3.5–5.2)
Alkaline Phosphatase: 59 U/L (ref 39–117)
BILIRUBIN DIRECT: 0.1 mg/dL (ref 0.0–0.3)
BILIRUBIN TOTAL: 0.8 mg/dL (ref 0.2–1.2)
Total Protein: 7 g/dL (ref 6.0–8.3)

## 2014-05-17 LAB — BASIC METABOLIC PANEL
BUN: 15 mg/dL (ref 6–23)
CALCIUM: 8.7 mg/dL (ref 8.4–10.5)
CHLORIDE: 109 meq/L (ref 96–112)
CO2: 24 meq/L (ref 19–32)
Creatinine, Ser: 1.1 mg/dL (ref 0.4–1.5)
GFR: 71.17 mL/min (ref >60.00)
GLUCOSE: 102 mg/dL — AB (ref 70–99)
Potassium: 4.1 mEq/L (ref 3.5–5.1)
Sodium: 138 mEq/L (ref 135–145)

## 2014-05-17 LAB — POCT INR: INR: 3.3

## 2014-05-17 MED ORDER — DICLOFENAC SODIUM 1 % TD GEL: 2.0000 g | Freq: Four times a day (QID) | TRANSDERMAL | Status: DC

## 2014-05-17 NOTE — Progress Notes (Signed)
Subjective:    Patient ID: Shawn [name], male    DOB: [birth year], 79 y.o.   MRN: [id number]  HPI 79 year old white male, nonsmoker with a history of hypertension, hyperlipidemia, atrial fibrillation, coronary artery disease is in today to be established in for recheck. Reports doing well. Has concerns of right knee pain that's been ongoing for several months. Has been taken Tylenol that does help some. Rates the pain a 5 out of 10, worse with walking. Does report popping and cracking in the right knee. Patient is on Coumadin and tolerates medication well no increase in bleeding or bruising.   Review of Systems  Constitutional: Negative.   HENT: Negative.   Respiratory: Negative.   Cardiovascular: Negative.   Gastrointestinal: Negative.   Endocrine: Negative.   Genitourinary: Negative.   Musculoskeletal: Positive for arthralgias.       Right knee pain  Skin: Negative.   Allergic/Immunologic: Negative.   Neurological: Negative.   Hematological: Negative.   Psychiatric/Behavioral: Negative.    Past Medical History  Diagnosis Date  . Actinic keratosis   . ALLERGIC RHINITIS   . Atrial fibrillation   . CAD, NATIVE VESSEL   . Cellulitis and abscess of trunk   . HYPERLIPIDEMIA   . HYPERTENSION   . OTH MALIG NEOPLASM SKIN OTH\T\UNSPEC PARTS FACE   . PACEMAKER, PERMANENT 2008  . GERD (gastroesophageal reflux disease)     History   Social History  . Marital Status: Married    Spouse Name: N/A    Number of Children: N/A  . Years of Education: N/A   Occupational History  . Not on file.   Social History Main Topics  . Smoking status: Never Smoker   . Smokeless tobacco: Never Used  . Alcohol Use: Yes     Comment: 05/14/11 "last occasional drink was in 1990's"  . Drug Use: No  . Sexual Activity: Yes   Other Topics Concern  . Not on file   Social History Narrative    Past Surgical History  Procedure Laterality Date  . Cystectomy      from right side of face.    . Cataract extraction w/ intraocular lens  implant, bilateral  2012  . Back surgery  2000    "for bulging discs"  . Insert / replace / remove pacemaker  2008    with lead rvision and microperforation  . Coronary angioplasty with stent placement  2008  . Coronary angioplasty with stent placement  05/14/11  . Left heart catheterization with coronary angiogram N/A 05/14/2011    Procedure: LEFT HEART CATHETERIZATION WITH CORONARY ANGIOGRAM;  Surgeon: Hillary Bow, MD;  Location: [facility] CATH LAB;  Service: Cardiovascular;  Laterality: N/A;    Family History  Problem Relation Age of Onset  . Colon cancer Other     No Known Allergies  Current Outpatient Prescriptions on File Prior to Visit  Medication Sig Dispense Refill  . aspirin EC 81 MG tablet Take 81 mg by mouth daily.    Marland Kitchen atorvastatin (LIPITOR) 20 MG tablet Take 1 tablet by mouth  every day 90 tablet 3  . diltiazem (CARDIZEM LA) 240 MG 24 hr tablet Take 1 tablet (240 mg  total) by mouth daily. NEEDS OV 90 tablet 0  . fluticasone (FLONASE) 50 MCG/ACT nasal spray Place 2 sprays into the nose daily.      . metoprolol succinate (TOPROL-XL) 50 MG 24 hr tablet One and one half tablets once daily 135 tablet 11  . nitroGLYCERIN (  NITROSTAT) 0.4 MG SL tablet Place 1 tablet (0.4 mg total) under the tongue every 5 (five) minutes as needed. 25 tablet 6  . omeprazole (PRILOSEC) 20 MG capsule Take 1 capsule (20 mg total) by mouth daily. 90 capsule 3  . tamsulosin (FLOMAX) 0.4 MG CAPS capsule Take 1 capsule (0.4 mg  total) by mouth daily. 90 capsule 3  . warfarin (COUMADIN) 2.5 MG tablet Take as directed by the [facility] 90 tablet 0   No current facility-administered medications on file prior to visit.    BP 118/78 mmHg  Pulse 91  Ht 5\' 6"  (1.676 m)  Wt 202 lb (91.627 kg)  BMI 32.62 kg/m2chart    Objective:   Physical Exam  Constitutional: He is oriented to person, place, and time. He appears well-developed and well-nourished.   HENT:  Right Ear: External ear normal.  Left Ear: External ear normal.  Nose: Nose normal.  Mouth/Throat: Oropharynx is clear and moist.  Neck: Normal range of motion. Neck supple. No thyromegaly present.  Cardiovascular: Normal rate and normal heart sounds.   Atrial fibrillation  Pulmonary/Chest: Effort normal and breath sounds normal.  Abdominal: Soft. Bowel sounds are normal.  Musculoskeletal: Normal range of motion. He exhibits tenderness.  Tenderness to palpation of the medial aspect of the right knee. Positive crepitus. Minimal joint effusion  Neurological: He is alert and oriented to person, place, and time.  Skin: Skin is warm and dry.  Psychiatric: He has a normal mood and affect.          Assessment & Plan:  Shawn [name] was seen today for establish care.  Diagnoses and associated orders for this visit:  Essential hypertension - Hepatic Function Panel - Basic Metabolic Panel  Right knee pain - DG Knee Complete 4 Views Right; Future  Pure hypercholesterolemia - Lipid Panel - Hepatic Function Panel - Basic Metabolic Panel  Encounter for therapeutic drug monitoring  Persistent atrial fibrillation  Chronic atrial fibrillation  BPH (benign prostatic hyperplasia) - PSA, Medicare  Other Orders - POCT INR    We'll obtain an x-ray of the right knee to evaluate for osteoarthritis. Encouraged exercise Tylenol as needed for pain. Consider referral to orthopedics if necessary. Continue current medications recheck in 4 weeks to have pro time redone an for an office visit in 4 months.

## 2014-05-17 NOTE — Patient Instructions (Signed)

## 2014-05-17 NOTE — Progress Notes (Signed)
Pre visit review using our clinic review tool, if applicable. No additional management support is needed unless otherwise documented below in the visit note. 

## 2014-05-18 ENCOUNTER — Encounter: Payer: Self-pay | Admitting: Internal Medicine

## 2014-05-18 ENCOUNTER — Ambulatory Visit (INDEPENDENT_AMBULATORY_CARE_PROVIDER_SITE_OTHER): Payer: MEDICARE | Admitting: Internal Medicine

## 2014-05-18 VITALS — BP 138/83 | HR 78 | Ht 66.0 in | Wt 203.4 lb

## 2014-05-18 DIAGNOSIS — Z950 Presence of cardiac pacemaker: Secondary | ICD-10-CM

## 2014-05-18 DIAGNOSIS — I2510 Atherosclerotic heart disease of native coronary artery without angina pectoris: Secondary | ICD-10-CM

## 2014-05-18 DIAGNOSIS — I482 Chronic atrial fibrillation, unspecified: Secondary | ICD-10-CM

## 2014-05-18 DIAGNOSIS — I10 Essential (primary) hypertension: Secondary | ICD-10-CM

## 2014-05-18 LAB — MDC_IDC_ENUM_SESS_TYPE_INCLINIC
Battery Voltage: 2.85 V
Brady Statistic AP VP Percent: 2.02 %
Brady Statistic AP VS Percent: 0.07 %
Brady Statistic AS VP Percent: 46.54 %
Brady Statistic AS VS Percent: 51.37 %
Brady Statistic RA Percent Paced: 2.09 %
Lead Channel Impedance Value: 424 Ohm
Lead Channel Impedance Value: 488 Ohm
Lead Channel Pacing Threshold Amplitude: 1 V
Lead Channel Sensing Intrinsic Amplitude: 1.1975
Lead Channel Sensing Intrinsic Amplitude: 3.4688
Lead Channel Setting Pacing Amplitude: 2 V
Lead Channel Setting Pacing Amplitude: 2.5 V
Lead Channel Setting Pacing Pulse Width: 0.4 ms
Lead Channel Setting Sensing Sensitivity: 0.9 mV
MDC IDC MSMT LEADCHNL RV PACING THRESHOLD PULSEWIDTH: 0.4 ms
MDC IDC SESS DTM: 20151215120214
MDC IDC SET ZONE DETECTION INTERVAL: 400 ms
MDC IDC STAT BRADY RV PERCENT PACED: 48.56 %
Zone Setting Detection Interval: 350 ms

## 2014-05-18 LAB — PSA, MEDICARE: PSA: 2.2 ng/ml (ref 0.10–4.00)

## 2014-05-18 NOTE — Assessment & Plan Note (Signed)
His ventricular rate is well controlled. He is pacing approximately 50% of the time. He will maintain his current medical therapy.

## 2014-05-18 NOTE — Progress Notes (Signed)
HPI Shawn [name] returns today for followup. He is a pleasant 79 yo man with a h/o symptomatic bradycardia s/p PPM insertion. He has chronic atrial fibrillation and has done well.  No syncope or chest pain. He remains active though not as much as previously. No edema He denies palpitations, but does admit to some dietary indiscretion. No peripheral edema. No Known Allergies   Current Outpatient Prescriptions  Medication Sig Dispense Refill  . aspirin EC 81 MG tablet Take 81 mg by mouth daily.    Marland Kitchen atorvastatin (LIPITOR) 20 MG tablet Take 1 tablet by mouth  every day 90 tablet 3  . diclofenac sodium (VOLTAREN) 1 % GEL Apply 2 g topically 4 (four) times daily. 1 Tube 1  . diltiazem (CARDIZEM LA) 240 MG 24 hr tablet Take 1 tablet (240 mg  total) by mouth daily. NEEDS OV 90 tablet 0  . fluticasone (FLONASE) 50 MCG/ACT nasal spray Place 2 sprays into the nose daily.      . metoprolol succinate (TOPROL-XL) 50 MG 24 hr tablet One and one half tablets once daily 135 tablet 11  . nitroGLYCERIN (NITROSTAT) 0.4 MG SL tablet Place 1 tablet (0.4 mg total) under the tongue every 5 (five) minutes as needed. 25 tablet 6  . omeprazole (PRILOSEC) 20 MG capsule Take 1 capsule (20 mg total) by mouth daily. 90 capsule 3  . tamsulosin (FLOMAX) 0.4 MG CAPS capsule Take 1 capsule (0.4 mg  total) by mouth daily. 90 capsule 3  . warfarin (COUMADIN) 2.5 MG tablet Take as directed by the [facility] 90 tablet 0   No current facility-administered medications for this visit.     Past Medical History  Diagnosis Date  . Actinic keratosis   . ALLERGIC RHINITIS   . Atrial fibrillation   . CAD, NATIVE VESSEL   . Cellulitis and abscess of trunk   . HYPERLIPIDEMIA   . HYPERTENSION   . OTH MALIG NEOPLASM SKIN OTH\T\UNSPEC PARTS FACE   . PACEMAKER, PERMANENT 2008  . GERD (gastroesophageal reflux disease)     ROS:   All systems reviewed and negative except as noted in the HPI.   Past Surgical History    Procedure Laterality Date  . Cystectomy      from right side of face.  . Cataract extraction w/ intraocular lens  implant, bilateral  2012  . Back surgery  2000    "for bulging discs"  . Insert / replace / remove pacemaker  2008    with lead rvision and microperforation  . Coronary angioplasty with stent placement  2008  . Coronary angioplasty with stent placement  05/14/11  . Left heart catheterization with coronary angiogram N/A 05/14/2011    Procedure: LEFT HEART CATHETERIZATION WITH CORONARY ANGIOGRAM;  Surgeon: Hillary Bow, MD;  Location: [facility] CATH LAB;  Service: Cardiovascular;  Laterality: N/A;     Family History  Problem Relation Age of Onset  . Colon cancer Other      History   Social History  . Marital Status: Married    Spouse Name: N/A    Number of Children: N/A  . Years of Education: N/A   Occupational History  . Not on file.   Social History Main Topics  . Smoking status: Never Smoker   . Smokeless tobacco: Never Used  . Alcohol Use: Yes     Comment: 05/14/11 "last occasional drink was in 1990's"  . Drug Use: No  . Sexual Activity: Yes  Other Topics Concern  . Not on file   Social History Narrative     BP 138/83 mmHg  Pulse 78  Ht 5\' 6"  (1.676 m)  Wt 203 lb 6.4 oz (92.262 kg)  BMI 32.85 kg/m2  Physical Exam:  Well appearing NAD HEENT: Unremarkable Neck:  No JVD, no thyromegally Lymphatics:  No adenopathy Back:  No CVA tenderness Lungs:  Clear with no wheezes, rales, or rhonchi. HEART:  Regular rate rhythm, no murmurs, no rubs, no clicks Abd:  soft, positive bowel sounds, no organomegally, no rebound, no guarding Ext:  2 plus pulses, no edema, no cyanosis, no clubbing Skin:  No rashes no nodules Neuro:  CN II through XII intact, motor grossly intact   DEVICE  Normal device function.  See PaceArt for details.   Assess/Plan:

## 2014-05-18 NOTE — Patient Instructions (Signed)
Your physician wants you to follow-up in: 12 months with Dr. Knox [name] will receive a reminder letter in the mail two months in advance. If you don't receive a letter, please call our office to schedule the follow-up appointment.  Remote monitoring is used to monitor your Pacemaker of ICD from home. This monitoring reduces the number of office visits required to check your device to one time per year. It allows [place] to keep an eye on the functioning of your device to ensure it is working properly. You are scheduled for a device check from home on 08/17/14. You may send your transmission at any time that day. If you have a wireless device, the transmission will be sent automatically. After your physician reviews your transmission, you will receive a postcard with your next transmission date.

## 2014-05-18 NOTE — Assessment & Plan Note (Signed)
His blood pressure is well controlled. He'll continue his current medications. I've encouraged the patient to lose weight, maintain a low-sodium diet.

## 2014-05-18 NOTE — Assessment & Plan Note (Signed)
His Medtronic dual-chamber pacemaker is working normally. He is approaching elective replacement. As he has chronic atrial fibrillation, I would anticipate a single chamber pacemaker be placed in the next several months when he reaches elective replacement.

## 2014-05-19 ENCOUNTER — Telehealth: Payer: Self-pay | Admitting: Family

## 2014-05-19 MED ORDER — DICLOFENAC SODIUM 1 % TD GEL: 2.0000 g | Freq: Four times a day (QID) | TRANSDERMAL | Status: DC

## 2014-05-19 NOTE — Telephone Encounter (Signed)
done

## 2014-05-19 NOTE — Telephone Encounter (Signed)
Patient states he received a call from his mail order pharmacy and was told they couldn't mail diclofenac sodium (VOLTAREN) 1 % GEL because it was considered an explosive.  He needs the medication sent to [facility], please.

## 2014-06-14 ENCOUNTER — Ambulatory Visit (INDEPENDENT_AMBULATORY_CARE_PROVIDER_SITE_OTHER): Payer: MEDICARE | Admitting: Family

## 2014-06-14 DIAGNOSIS — Z5181 Encounter for therapeutic drug level monitoring: Secondary | ICD-10-CM | POA: Diagnosis not present

## 2014-06-14 DIAGNOSIS — I482 Chronic atrial fibrillation, unspecified: Secondary | ICD-10-CM

## 2014-06-14 LAB — POCT INR: INR: 2.6

## 2014-06-14 NOTE — Patient Instructions (Signed)
Continue 2.5 mg all days except 5 mg on M/W/F.  Recheck in 4 weeks   Anticoagulation Dose Instructions as of 06/14/2014      Shawn [name] Tue Wed Thu Fri Sat   New Dose 2.5 mg 5 mg 2.5 mg 5 mg 2.5 mg 5 mg 2.5 mg    Description        Continue 2.5 mg all days except 5 mg on M/W/F.  Recheck in 4 weeks

## 2014-06-15 ENCOUNTER — Ambulatory Visit (INDEPENDENT_AMBULATORY_CARE_PROVIDER_SITE_OTHER): Payer: MEDICARE | Admitting: Family

## 2014-06-15 ENCOUNTER — Encounter: Payer: Self-pay | Admitting: Family

## 2014-06-15 VITALS — BP 112/68 | HR 84 | Temp 98.7°F | Ht 66.0 in | Wt 207.0 lb

## 2014-06-15 DIAGNOSIS — D489 Neoplasm of uncertain behavior, unspecified: Secondary | ICD-10-CM

## 2014-06-15 NOTE — Progress Notes (Signed)
Pre visit review using our clinic review tool, if applicable. No additional management support is needed unless otherwise documented below in the visit note. 

## 2014-06-15 NOTE — Patient Instructions (Signed)
Biopsy Care After Refer to this sheet in the next few weeks. These instructions provide you with information on caring for yourself after your procedure. Your caregiver may also give you more specific instructions. Your treatment has been planned according to current medical practices, but problems sometimes occur. Call your caregiver if you have any problems or questions after your procedure. If you had a fine needle biopsy, you may have soreness at the biopsy site for 1 to 2 days. If you had an open biopsy, you may have soreness at the biopsy site for 3 to 4 days. HOME CARE INSTRUCTIONS   You may resume normal diet and activities as directed.  Change bandages (dressings) as directed. If your wound was closed with a skin glue (adhesive), it will wear off and begin to peel in 7 days.  Only take over-the-counter or prescription medicines for pain, discomfort, or fever as directed by your caregiver.  Ask your caregiver when you can bathe and get your wound wet. SEEK IMMEDIATE MEDICAL CARE IF:   You have increased bleeding (more than a small spot) from the biopsy site.  You notice redness, swelling, or increasing pain at the biopsy site.  You have pus coming from the biopsy site.  You have a fever.  You notice a bad smell coming from the biopsy site or dressing.  You have a rash, have difficulty breathing, or have any allergic problems. MAKE SURE YOU:   Understand these instructions.  Will watch your condition.  Will get help right away if you are not doing well or get worse. Document Released: 12/08/2004 Document Revised: 08/13/2011 Document Reviewed: 11/16/2010 ExitCare Patient Information 2015 [employer]. This information is not intended to replace advice given to you by your health care provider. Make sure you discuss any questions you have with your health care provider.  

## 2014-06-16 ENCOUNTER — Encounter: Payer: Self-pay | Admitting: Family

## 2014-06-16 NOTE — Progress Notes (Signed)
Subjective:    Patient ID: Shawn [name], male    DOB: Nov 01, 1934, 80 y.o.   MRN: [id number]  HPI  80 year old white male, nonsmoker is in today to have skin lesion removed topics right neck present 3-4 months.  Review of Systems  Constitutional: Negative.   Respiratory: Negative.   Cardiovascular: Negative.   Skin:       Skin lesion right neck x 3-4 months and growing.    Past Medical History  Diagnosis Date  . Actinic keratosis   . ALLERGIC RHINITIS   . Atrial fibrillation   . CAD, NATIVE VESSEL   . Cellulitis and abscess of trunk   . HYPERLIPIDEMIA   . HYPERTENSION   . OTH MALIG NEOPLASM SKIN OTH\T\UNSPEC PARTS FACE   . PACEMAKER, PERMANENT 2008  . GERD (gastroesophageal reflux disease)     History   Social History  . Marital Status: Married    Spouse Name: N/A    Number of Children: N/A  . Years of Education: N/A   Occupational History  . Not on file.   Social History Main Topics  . Smoking status: Never Smoker   . Smokeless tobacco: Never Used  . Alcohol Use: Yes     Comment: 05/14/11 "last occasional drink was in 1990's"  . Drug Use: No  . Sexual Activity: Yes   Other Topics Concern  . Not on file   Social History Narrative    Past Surgical History  Procedure Laterality Date  . Cystectomy      from right side of face.  . Cataract extraction w/ intraocular lens  implant, bilateral  2012  . Back surgery  2000    "for bulging discs"  . Insert / replace / remove pacemaker  2008    with lead rvision and microperforation  . Coronary angioplasty with stent placement  2008  . Coronary angioplasty with stent placement  05/14/11  . Left heart catheterization with coronary angiogram N/A 05/14/2011    Procedure: LEFT HEART CATHETERIZATION WITH CORONARY ANGIOGRAM;  Surgeon: Hillary Bow, MD;  Location: [facility] CATH LAB;  Service: Cardiovascular;  Laterality: N/A;    Family History  Problem Relation Age of Onset  . Colon cancer Other     No  Known Allergies  Current Outpatient Prescriptions on File Prior to Visit  Medication Sig Dispense Refill  . aspirin EC 81 MG tablet Take 81 mg by mouth daily.    Marland Kitchen atorvastatin (LIPITOR) 20 MG tablet Take 1 tablet by mouth  every day 90 tablet 3  . diclofenac sodium (VOLTAREN) 1 % GEL Apply 2 g topically 4 (four) times daily. 1 Tube 1  . diltiazem (CARDIZEM LA) 240 MG 24 hr tablet Take 1 tablet (240 mg  total) by mouth daily. NEEDS OV 90 tablet 0  . fluticasone (FLONASE) 50 MCG/ACT nasal spray Place 2 sprays into the nose daily.      . metoprolol succinate (TOPROL-XL) 50 MG 24 hr tablet One and one half tablets once daily 135 tablet 11  . nitroGLYCERIN (NITROSTAT) 0.4 MG SL tablet Place 1 tablet (0.4 mg total) under the tongue every 5 (five) minutes as needed. 25 tablet 6  . omeprazole (PRILOSEC) 20 MG capsule Take 1 capsule (20 mg total) by mouth daily. 90 capsule 3  . tamsulosin (FLOMAX) 0.4 MG CAPS capsule Take 1 capsule (0.4 mg  total) by mouth daily. 90 capsule 3  . warfarin (COUMADIN) 2.5 MG tablet Take as directed by the [facility]mad[facility] Clinic  90 tablet 0   No current facility-administered medications on file prior to visit.    BP 112/68 mmHg  Pulse 84  Temp(Src) 98.7 F (37.1 C) (Oral)  Ht 5\' 6"  (1.676 m)  Wt 207 lb (93.895 kg)  BMI 33.43 kg/m2chart     Objective:   Physical Exam  Constitutional: He appears well-developed and well-nourished.  Cardiovascular: Normal rate, regular rhythm and normal heart sounds.   Pulmonary/Chest: Effort normal and breath sounds normal.  Skin:         Informed consent was given by the patient for a shave biopsy. The site was prepped with Betadine and using a 15 blade a 1 cm shave biopsy was obtained. The specimin was placed in preservative and sent for pathology. Hemostasis was achieved with a compression. Wound care was discussed with the patient. The patient was informed that it would be one to 2 weeks before the pathology will be  interpreted.     Assessment & Plan:  Skin lesion removed and sent to pathology. We'll follow-up pending results.

## 2014-06-21 ENCOUNTER — Other Ambulatory Visit: Payer: Self-pay | Admitting: Family

## 2014-06-21 ENCOUNTER — Telehealth: Payer: Self-pay | Admitting: Cardiology

## 2014-06-21 ENCOUNTER — Ambulatory Visit (INDEPENDENT_AMBULATORY_CARE_PROVIDER_SITE_OTHER): Payer: MEDICARE | Admitting: *Deleted

## 2014-06-21 DIAGNOSIS — IMO0002 Reserved for concepts with insufficient information to code with codable children: Secondary | ICD-10-CM

## 2014-06-21 DIAGNOSIS — Z950 Presence of cardiac pacemaker: Secondary | ICD-10-CM

## 2014-06-21 NOTE — Telephone Encounter (Signed)
LMOVM reminding pt to send remote transmission.   

## 2014-06-21 NOTE — Progress Notes (Signed)
Remote pacemaker transmission.   

## 2014-06-22 LAB — MDC_IDC_ENUM_SESS_TYPE_REMOTE
Battery Voltage: 2.83 V
Brady Statistic AP VP Percent: 1.14 %
Brady Statistic AP VS Percent: 0.03 %
Brady Statistic AS VP Percent: 49.97 %
Brady Statistic AS VS Percent: 48.85 %
Brady Statistic RA Percent Paced: 1.18 %
Brady Statistic RV Percent Paced: 51.11 %
Date Time Interrogation Session: 20160118192141
Lead Channel Impedance Value: 496 Ohm
Lead Channel Setting Pacing Amplitude: 2 V
Lead Channel Setting Pacing Pulse Width: 0.4 ms
MDC IDC MSMT LEADCHNL RV IMPEDANCE VALUE: 440 Ohm
MDC IDC SET LEADCHNL RV PACING AMPLITUDE: 2.5 V
MDC IDC SET LEADCHNL RV SENSING SENSITIVITY: 0.9 mV
Zone Setting Detection Interval: 350 ms
Zone Setting Detection Interval: 400 ms

## 2014-06-24 IMAGING — CR DG CHEST 2V
2 series · 2 of 2 positions shown · non-contrast
Comparison: 03/22/2007.

CLINICAL DATA: Cough.

CHEST - 2 VIEW

[view not recorded (1 of 2)]
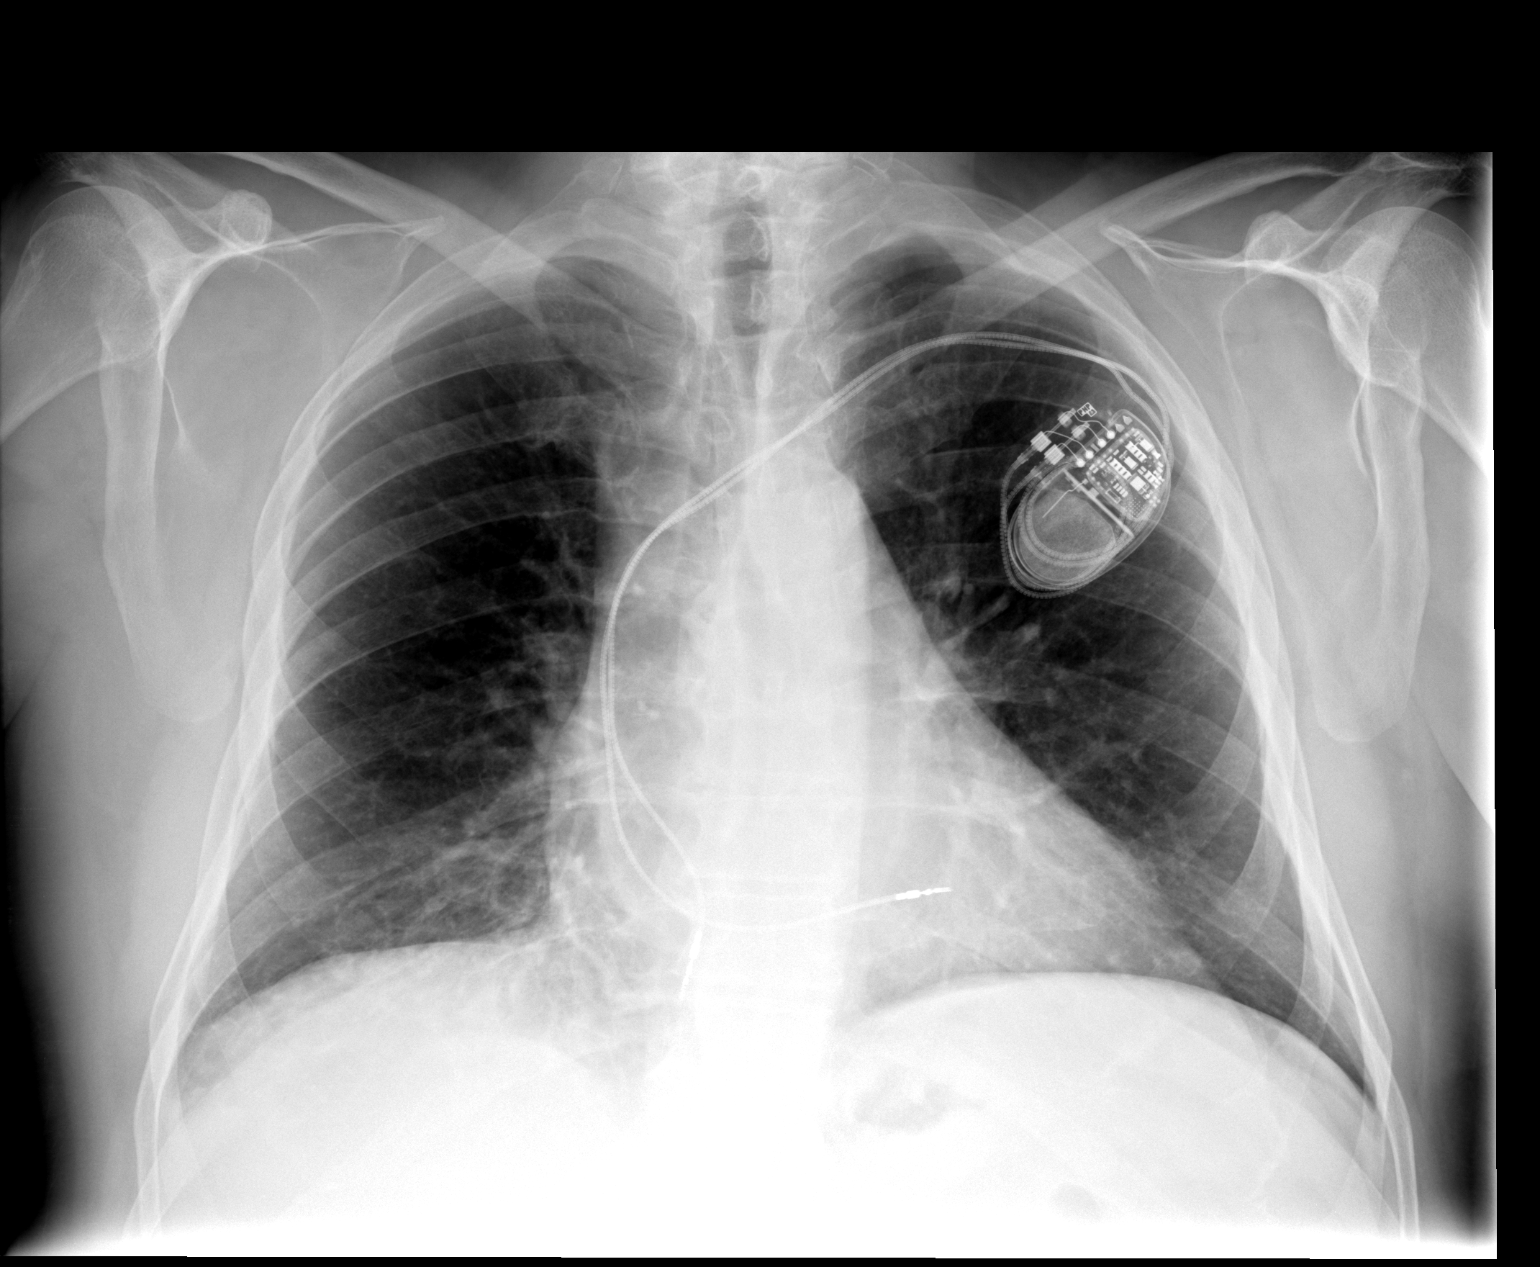

[view not recorded (2 of 2)]
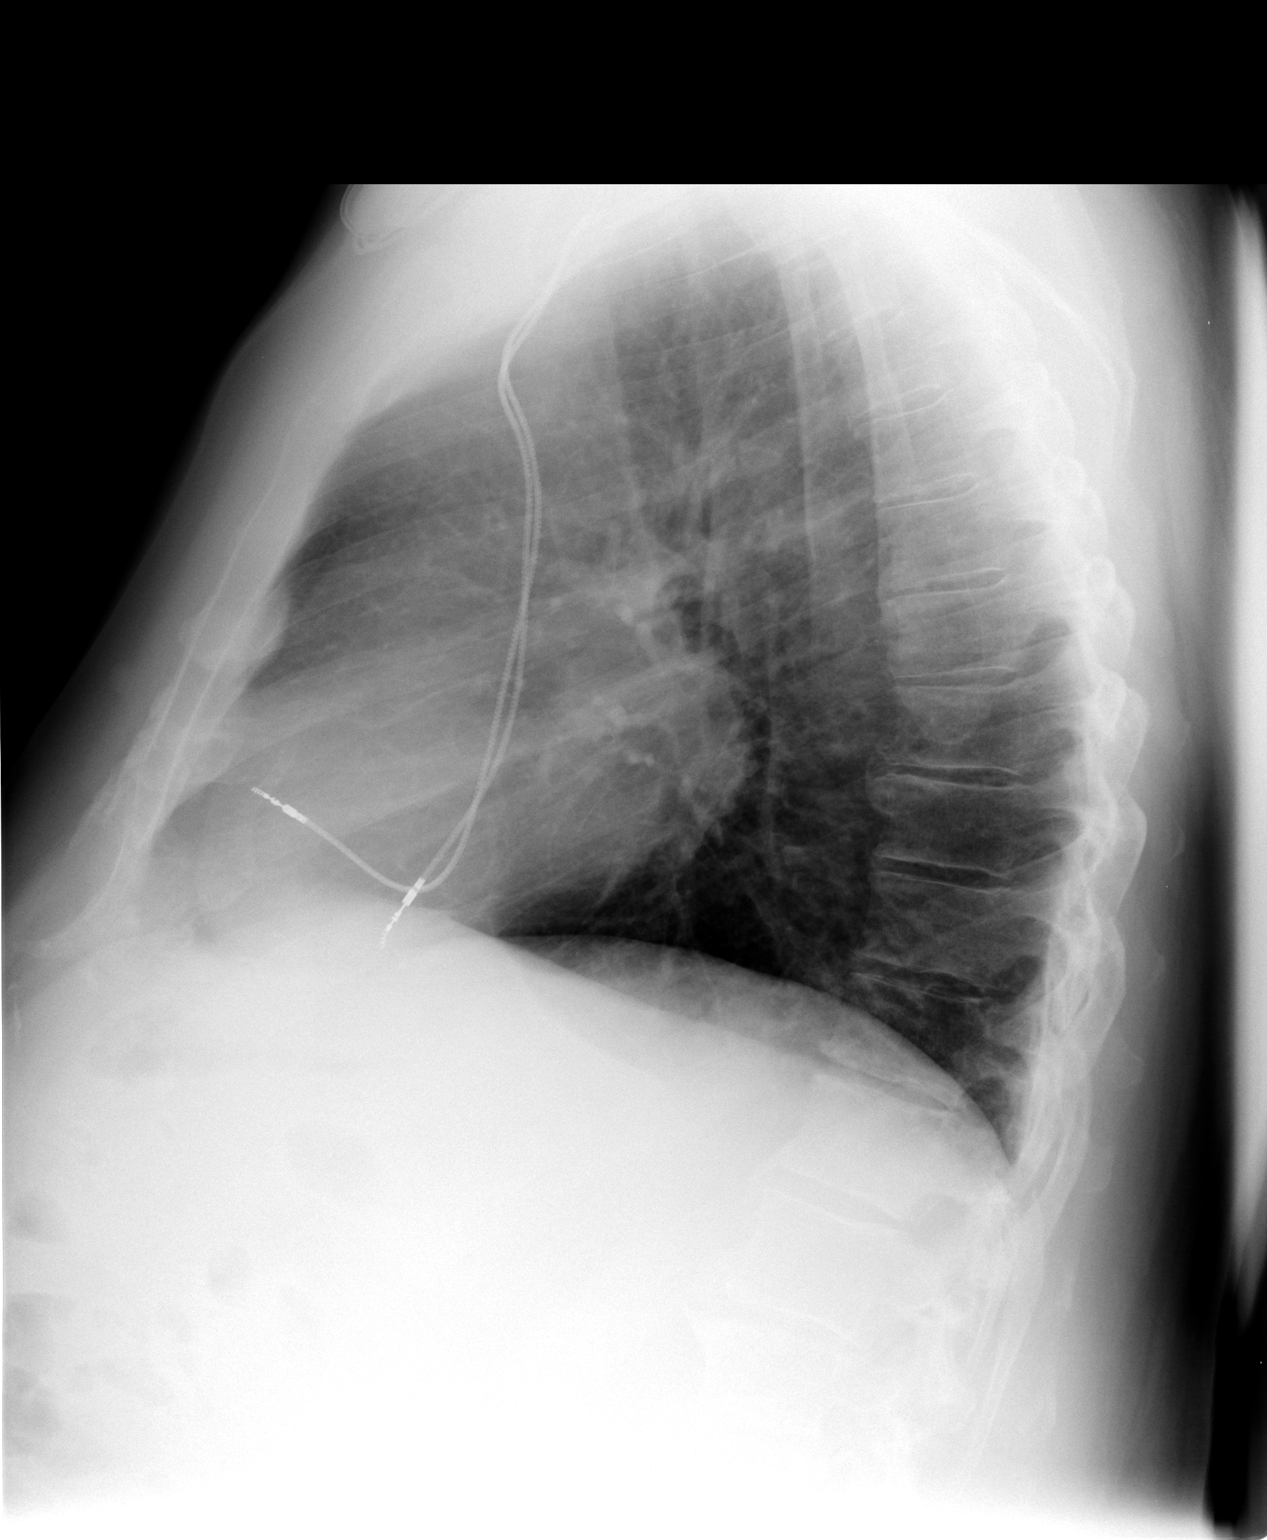

[2 of 2 positions shown; findings below may reference images not displayed]

FINDINGS: The cardiac silhouette, mediastinal and hilar contours
are normal and stable.  The pacer wires are stable. Ill-defined
right lower lobe lung opacities suspicious for pneumonia.  No
definite pleural effusion.  The left lung is clear.
IMPRESSION: Right lower lobe pneumonia.

## 2014-07-01 ENCOUNTER — Encounter: Payer: Self-pay | Admitting: Cardiology

## 2014-07-05 ENCOUNTER — Other Ambulatory Visit: Payer: Self-pay

## 2014-07-05 MED ORDER — DILTIAZEM HCL ER COATED BEADS 240 MG PO TB24: ORAL_TABLET | ORAL | Status: DC

## 2014-07-05 NOTE — Telephone Encounter (Signed)
Rx request for cardizem LA 240 mg tablet-Take 1 tablet by mouth daily.  #90  Pharm:  [facility]  Pls advise.

## 2014-07-07 ENCOUNTER — Encounter: Payer: Self-pay | Admitting: Internal Medicine

## 2014-07-12 ENCOUNTER — Ambulatory Visit: Payer: MEDICARE

## 2014-07-19 ENCOUNTER — Ambulatory Visit: Payer: MEDICARE

## 2014-07-22 ENCOUNTER — Telehealth: Payer: Self-pay | Admitting: Cardiology

## 2014-07-22 ENCOUNTER — Ambulatory Visit (INDEPENDENT_AMBULATORY_CARE_PROVIDER_SITE_OTHER): Payer: MEDICARE | Admitting: General Practice

## 2014-07-22 ENCOUNTER — Ambulatory Visit (INDEPENDENT_AMBULATORY_CARE_PROVIDER_SITE_OTHER): Payer: MEDICARE | Admitting: *Deleted

## 2014-07-22 DIAGNOSIS — Z950 Presence of cardiac pacemaker: Secondary | ICD-10-CM

## 2014-07-22 DIAGNOSIS — Z5181 Encounter for therapeutic drug level monitoring: Secondary | ICD-10-CM

## 2014-07-22 LAB — MDC_IDC_ENUM_SESS_TYPE_REMOTE
Brady Statistic AP VP Percent: 1.21 %
Brady Statistic AP VS Percent: 0.03 %
Brady Statistic AS VP Percent: 53.22 %
Brady Statistic AS VS Percent: 45.54 %
Brady Statistic RV Percent Paced: 54.42 %
Lead Channel Impedance Value: 496 Ohm
Lead Channel Setting Pacing Amplitude: 2.5 V
MDC IDC MSMT BATTERY VOLTAGE: 2.83 V
MDC IDC MSMT LEADCHNL RV IMPEDANCE VALUE: 440 Ohm
MDC IDC MSMT LEADCHNL RV SENSING INTR AMPL: 2.0813
MDC IDC SESS DTM: 20160218191956
MDC IDC SET LEADCHNL RA PACING AMPLITUDE: 2 V
MDC IDC SET LEADCHNL RV PACING PULSEWIDTH: 0.4 ms
MDC IDC SET LEADCHNL RV SENSING SENSITIVITY: 0.9 mV
MDC IDC SET ZONE DETECTION INTERVAL: 350 ms
MDC IDC STAT BRADY RA PERCENT PACED: 1.24 %
Zone Setting Detection Interval: 400 ms

## 2014-07-22 LAB — POCT INR: INR: 3.3

## 2014-07-22 NOTE — Telephone Encounter (Signed)
Spoke with pt and reminded pt of remote transmission that is due today. Pt verbalized understanding.   

## 2014-07-22 NOTE — Progress Notes (Signed)
Remote pacemaker transmission.   

## 2014-07-22 NOTE — Progress Notes (Signed)
Pre visit review using our clinic review tool, if applicable. No additional management support is needed unless otherwise documented below in the visit note. 

## 2014-07-30 ENCOUNTER — Encounter: Payer: Self-pay | Admitting: *Deleted

## 2014-08-10 ENCOUNTER — Encounter: Payer: Self-pay | Admitting: Internal Medicine

## 2014-08-17 ENCOUNTER — Ambulatory Visit (INDEPENDENT_AMBULATORY_CARE_PROVIDER_SITE_OTHER): Payer: MEDICARE | Admitting: *Deleted

## 2014-08-17 DIAGNOSIS — I495 Sick sinus syndrome: Secondary | ICD-10-CM | POA: Diagnosis not present

## 2014-08-17 LAB — MDC_IDC_ENUM_SESS_TYPE_REMOTE
Battery Voltage: 2.82 V
Brady Statistic AP VP Percent: 1.44 %
Brady Statistic AP VS Percent: 0.03 %
Brady Statistic AS VS Percent: 41.6 %
Brady Statistic RA Percent Paced: 1.48 %
Brady Statistic RV Percent Paced: 58.36 %
Date Time Interrogation Session: 20160315130523
Lead Channel Impedance Value: 424 Ohm
Lead Channel Impedance Value: 432 Ohm
Lead Channel Setting Pacing Pulse Width: 0.4 ms
Lead Channel Setting Sensing Sensitivity: 0.9 mV
MDC IDC SET LEADCHNL RA PACING AMPLITUDE: 2 V
MDC IDC SET LEADCHNL RV PACING AMPLITUDE: 2.5 V
MDC IDC STAT BRADY AS VP PERCENT: 56.92 %
Zone Setting Detection Interval: 350 ms
Zone Setting Detection Interval: 400 ms

## 2014-08-18 ENCOUNTER — Other Ambulatory Visit: Payer: Self-pay | Admitting: General Practice

## 2014-08-18 MED ORDER — WARFARIN SODIUM 2.5 MG PO TABS: ORAL_TABLET | ORAL | Status: DC

## 2014-08-18 NOTE — Progress Notes (Signed)
Remote pacemaker transmission.   

## 2014-08-19 ENCOUNTER — Ambulatory Visit (INDEPENDENT_AMBULATORY_CARE_PROVIDER_SITE_OTHER): Payer: MEDICARE | Admitting: General Practice

## 2014-08-19 DIAGNOSIS — Z5181 Encounter for therapeutic drug level monitoring: Secondary | ICD-10-CM

## 2014-08-19 LAB — POCT INR: INR: 2.6

## 2014-08-19 NOTE — Progress Notes (Signed)
Pre visit review using our clinic review tool, if applicable. No additional management support is needed unless otherwise documented below in the visit note. 

## 2014-08-26 ENCOUNTER — Encounter: Payer: Self-pay | Admitting: Cardiology

## 2014-09-03 ENCOUNTER — Encounter: Payer: Self-pay | Admitting: Internal Medicine

## 2014-09-09 ENCOUNTER — Encounter: Payer: Self-pay | Admitting: Family Medicine

## 2014-09-09 ENCOUNTER — Telehealth: Payer: Self-pay | Admitting: Cardiology

## 2014-09-09 ENCOUNTER — Ambulatory Visit (INDEPENDENT_AMBULATORY_CARE_PROVIDER_SITE_OTHER): Payer: Commercial Managed Care - HMO | Admitting: Family Medicine

## 2014-09-09 VITALS — BP 122/58 | HR 87 | Temp 97.9°F | Wt 207.0 lb

## 2014-09-09 DIAGNOSIS — I2510 Atherosclerotic heart disease of native coronary artery without angina pectoris: Secondary | ICD-10-CM

## 2014-09-09 DIAGNOSIS — I4819 Other persistent atrial fibrillation: Secondary | ICD-10-CM

## 2014-09-09 DIAGNOSIS — I481 Persistent atrial fibrillation: Secondary | ICD-10-CM

## 2014-09-09 DIAGNOSIS — E785 Hyperlipidemia, unspecified: Secondary | ICD-10-CM

## 2014-09-09 DIAGNOSIS — N401 Enlarged prostate with lower urinary tract symptoms: Secondary | ICD-10-CM | POA: Insufficient documentation

## 2014-09-09 DIAGNOSIS — R351 Nocturia: Secondary | ICD-10-CM

## 2014-09-09 DIAGNOSIS — Z23 Encounter for immunization: Secondary | ICD-10-CM | POA: Diagnosis not present

## 2014-09-09 DIAGNOSIS — I10 Essential (primary) hypertension: Secondary | ICD-10-CM

## 2014-09-09 MED ORDER — NITROGLYCERIN 0.4 MG SL SUBL: 0.4000 mg | SUBLINGUAL_TABLET | SUBLINGUAL | Status: DC | PRN

## 2014-09-09 MED ORDER — METOPROLOL SUCCINATE ER 50 MG PO TB24: ORAL_TABLET | ORAL | Status: DC

## 2014-09-09 MED ORDER — FLUTICASONE PROPIONATE 50 MCG/ACT NA SUSP: 2.0000 | Freq: Every day | NASAL | Status: DC

## 2014-09-09 MED ORDER — DILTIAZEM HCL ER COATED BEADS 240 MG PO TB24: ORAL_TABLET | ORAL | Status: DC

## 2014-09-09 MED ORDER — WARFARIN SODIUM 2.5 MG PO TABS: ORAL_TABLET | ORAL | Status: DC

## 2014-09-09 MED ORDER — OMEPRAZOLE 20 MG PO CPDR: 20.0000 mg | DELAYED_RELEASE_CAPSULE | Freq: Every day | ORAL | Status: DC

## 2014-09-09 MED ORDER — TAMSULOSIN HCL 0.4 MG PO CAPS: ORAL_CAPSULE | ORAL | Status: DC

## 2014-09-09 MED ORDER — ATORVASTATIN CALCIUM 20 MG PO TABS: ORAL_TABLET | ORAL | Status: DC

## 2014-09-09 NOTE — Telephone Encounter (Signed)
Pt c/o medication issue: 1. Name of Medication: Warfrin, Lipitor (the Generic)   2. How are you currently taking this medication (dosage and times per day)?  3. Are you having a reaction (difficulty breathing--STAT)?   4. What is your medication issue?  Comment: Pt called will need a prior auth for [employer]. Pt has provided the number to call for the prior auth [phone]

## 2014-09-09 NOTE — Patient Instructions (Addendum)
Received final Pneumonia shot today ([id number])  Refilled all medicines  Overall things are looking good-no chest pain, blood pressure looks great, heart rate is acceptable.   Health Maintenance Due  Topic Date Due  . COLONOSCOPY - we will investigate 01/20/1987   Check in with me 6 months for another med check, keep appointment for next year with me so we can go through rest of your history

## 2014-09-09 NOTE — Assessment & Plan Note (Signed)
S: controlled on atorvastain 20mg  with last LDL 52.  A/P: Doing well, refill atorvastatin

## 2014-09-09 NOTE — Assessment & Plan Note (Signed)
S: Follows primarily with Dr. Percival Spanish. CAD stents in 2002 x2, 2012 x2. ASA, statin, metoprolol, nitro prn. Asymptomatic A/P: Continue aspirin. Refill statin and metoprolol. Refill nitroglycerin. Continue cardiology follow-up

## 2014-09-09 NOTE — Progress Notes (Signed)
Garret Reddish, MD Phone: [phone]  Subjective:   Shawn [name] is a 80 y.o. year old very pleasant male patient who presents with the following:  See problem-oriented chart ROS-no chest pain or shortness of breath. Goes up stairs without difficulty. Denies palpitations.   Past Medical History- Patient Active Problem List   Diagnosis Date Noted  . CAD, NATIVE VESSEL 09/27/2008    Priority: High  . PACEMAKER, PERMANENT 03/11/2007    Priority: High  . Atrial fibrillation 02/11/2007    Priority: High  . BPH associated with nocturia 09/09/2014    Priority: Medium  . Hyperlipidemia 03/10/2008    Priority: Medium  . Essential hypertension 12/02/2006    Priority: Medium  . Encounter for therapeutic drug monitoring 09/24/2013    Priority: Low  . Thrombocytopenia 06/17/2011    Priority: Low  . Hx of skin cancer, basal cell 04/07/2008    Priority: Low  . Actinic keratosis 04/07/2008    Priority: Low  . Allergic rhinitis 12/02/2006    Priority: Low  . GERD 12/02/2006    Priority: Low   Medications- reviewed and updated Current Outpatient Prescriptions  Medication Sig Dispense Refill  . aspirin EC 81 MG tablet Take 81 mg by mouth daily.    Marland Kitchen atorvastatin (LIPITOR) 20 MG tablet Take 1 tablet by mouth  every day 90 tablet 3  . diltiazem (CARDIZEM LA) 240 MG 24 hr tablet Take 1 tablet (240 mg  total) by mouth daily. 90 tablet 3  . fluticasone (FLONASE) 50 MCG/ACT nasal spray Place 2 sprays into both nostrils daily. 48 g 3  . metoprolol succinate (TOPROL-XL) 50 MG 24 hr tablet One and one half tablets once daily 135 tablet 3  . omeprazole (PRILOSEC) 20 MG capsule Take 1 capsule (20 mg total) by mouth daily. 90 capsule 3  . tamsulosin (FLOMAX) 0.4 MG CAPS capsule Take 1 capsule (0.4 mg  total) by mouth daily. 90 capsule 3  . warfarin (COUMADIN) 2.5 MG tablet Take as directed by the [facility] Clinic 135 tablet 3  . nitroGLYCERIN (NITROSTAT) 0.4 MG SL tablet Place 1 tablet (0.4  mg total) under the tongue every 5 (five) minutes as needed. 25 tablet 3   No current facility-administered medications for this visit.    Objective: BP 122/58 mmHg  Pulse 87  Temp(Src) 97.9 F (36.6 C)  Wt 207 lb (93.895 kg) Gen: NAD, resting comfortably CV: regular rate irregularly irregular, no murmurs rubs or gallops Lungs: CTAB no crackles, wheeze, rhonchi Abdomen: soft/nontender/nondistended/normal bowel sounds. No rebound or guarding.  Ext: no edema Skin: warm, dry, rash on right forehead (under derm treatment) Neuro: grossly normal, moves all extremities, normal gait   Assessment/Plan:  CAD, NATIVE VESSEL S: Follows primarily with Dr. Percival Spanish. CAD stents in 2002 x2, 2012 x2. ASA, statin, metoprolol, nitro prn. Asymptomatic A/P: Continue aspirin. Refill statin and metoprolol. Refill nitroglycerin. Continue cardiology follow-up   Atrial fibrillation S: Follows with Dr. Lovena Le of cardiology. Pacemaker in place. He is anticoagulated with warfarin through our clinic. We also prescribed diltiazem and metoprolol for rate control. Asymptomatic A/P: Overall doing well. Continue electrophysiology follow-up. Continue and refilled diltiazem, metoprolol, warfarin.    Hyperlipidemia S: controlled on atorvastain 20mg  with last LDL 52.  A/P: Doing well, refill atorvastatin    Essential hypertension S: Controlled on diltiazem and metoprolol A/P: Refill and continue diltiazem and metoprolol     six-month follow-up advised  Orders Placed This Encounter  Procedures  . Pneumococcal conjugate vaccine 13-valent  Meds ordered this encounter  Medications  . atorvastatin (LIPITOR) 20 MG tablet    Sig: Take 1 tablet by mouth  every day    Dispense:  90 tablet    Refill:  3  . diltiazem (CARDIZEM LA) 240 MG 24 hr tablet    Sig: Take 1 tablet (240 mg  total) by mouth daily.    Dispense:  90 tablet    Refill:  3  . fluticasone (FLONASE) 50 MCG/ACT nasal spray    Sig: Place  2 sprays into both nostrils daily.    Dispense:  48 g    Refill:  3  . metoprolol succinate (TOPROL-XL) 50 MG 24 hr tablet    Sig: One and one half tablets once daily    Dispense:  135 tablet    Refill:  3  . nitroGLYCERIN (NITROSTAT) 0.4 MG SL tablet    Sig: Place 1 tablet (0.4 mg total) under the tongue every 5 (five) minutes as needed.    Dispense:  25 tablet    Refill:  3  . omeprazole (PRILOSEC) 20 MG capsule    Sig: Take 1 capsule (20 mg total) by mouth daily.    Dispense:  90 capsule    Refill:  3  . tamsulosin (FLOMAX) 0.4 MG CAPS capsule    Sig: Take 1 capsule (0.4 mg  total) by mouth daily.    Dispense:  90 capsule    Refill:  3  . warfarin (COUMADIN) 2.5 MG tablet    Sig: Take as directed by the [facility]    Dispense:  135 tablet    Refill:  3    90 day supply

## 2014-09-09 NOTE — Assessment & Plan Note (Signed)
S: Follows with Dr. Lovena Le of cardiology. Pacemaker in place. He is anticoagulated with warfarin through our clinic. We also prescribed diltiazem and metoprolol for rate control. Asymptomatic A/P: Overall doing well. Continue electrophysiology follow-up. Continue and refilled diltiazem, metoprolol, warfarin.

## 2014-09-09 NOTE — Assessment & Plan Note (Signed)
S: Controlled on diltiazem and metoprolol A/P: Refill and continue diltiazem and metoprolol

## 2014-09-10 ENCOUNTER — Encounter: Payer: Self-pay | Admitting: Gastroenterology

## 2014-09-13 ENCOUNTER — Telehealth: Payer: Self-pay

## 2014-09-13 NOTE — Telephone Encounter (Signed)
Spoke with [place] who states that patient does not needed P.A. For these medications. Called patient and let him know that these will be sent out tomorrow per pharmacy

## 2014-09-13 NOTE — Telephone Encounter (Signed)
                        [employer] refill request for  diltiazem (CARDIZEM LA) 240 MG 24 hr tablet, nitroGLYCERIN (NITROSTAT) 0.4 MG SL tablet,omeprazole (PRILOSEC) 20 MG capsule, tamsulosin (FLOMAX) 0.4 MG CAPS capsule, metoprolol succinate (TOPROL-XL) 50 MG 24 hr tablet    ALL REFILLS DONE ON 09/09/14

## 2014-09-15 ENCOUNTER — Other Ambulatory Visit: Payer: Self-pay

## 2014-09-15 NOTE — Progress Notes (Signed)
Colonoscopy scanned in under procedures from 11/14/05, pt was due for repeat 5 years ago.

## 2014-09-16 ENCOUNTER — Ambulatory Visit (INDEPENDENT_AMBULATORY_CARE_PROVIDER_SITE_OTHER): Payer: Commercial Managed Care - HMO | Admitting: *Deleted

## 2014-09-16 ENCOUNTER — Telehealth: Payer: Self-pay | Admitting: Cardiology

## 2014-09-16 ENCOUNTER — Ambulatory Visit (INDEPENDENT_AMBULATORY_CARE_PROVIDER_SITE_OTHER): Payer: Commercial Managed Care - HMO | Admitting: General Practice

## 2014-09-16 DIAGNOSIS — Z5181 Encounter for therapeutic drug level monitoring: Secondary | ICD-10-CM

## 2014-09-16 DIAGNOSIS — Z950 Presence of cardiac pacemaker: Secondary | ICD-10-CM

## 2014-09-16 LAB — MDC_IDC_ENUM_SESS_TYPE_REMOTE
Brady Statistic AP VS Percent: 0.03 %
Brady Statistic AS VS Percent: 42.3 %
Brady Statistic RA Percent Paced: 2.09 %
Brady Statistic RV Percent Paced: 57.68 %
Date Time Interrogation Session: 20160414172331
Lead Channel Impedance Value: 504 Ohm
Lead Channel Setting Pacing Amplitude: 2 V
Lead Channel Setting Pacing Pulse Width: 0.4 ms
MDC IDC MSMT BATTERY VOLTAGE: 2.82 V
MDC IDC MSMT LEADCHNL RV IMPEDANCE VALUE: 440 Ohm
MDC IDC SET LEADCHNL RV PACING AMPLITUDE: 2.5 V
MDC IDC SET LEADCHNL RV SENSING SENSITIVITY: 0.9 mV
MDC IDC SET ZONE DETECTION INTERVAL: 350 ms
MDC IDC STAT BRADY AP VP PERCENT: 2.06 %
MDC IDC STAT BRADY AS VP PERCENT: 55.62 %
Zone Setting Detection Interval: 400 ms

## 2014-09-16 LAB — POCT INR: INR: 2.5

## 2014-09-16 NOTE — Progress Notes (Signed)
Remote pacemaker transmission.   

## 2014-09-16 NOTE — Progress Notes (Signed)
Pre visit review using our clinic review tool, if applicable. No additional management support is needed unless otherwise documented below in the visit note. 

## 2014-09-16 NOTE — Telephone Encounter (Signed)
LMOVM reminding pt to send remote transmission.   

## 2014-09-27 ENCOUNTER — Encounter: Payer: Self-pay | Admitting: Cardiology

## 2014-09-28 ENCOUNTER — Other Ambulatory Visit: Payer: Self-pay | Admitting: *Deleted

## 2014-09-28 MED ORDER — TAMSULOSIN HCL 0.4 MG PO CAPS: ORAL_CAPSULE | ORAL | Status: DC

## 2014-09-28 MED ORDER — METOPROLOL SUCCINATE ER 50 MG PO TB24: ORAL_TABLET | ORAL | Status: DC

## 2014-09-29 ENCOUNTER — Encounter: Payer: Self-pay | Admitting: Family Medicine

## 2014-09-29 DIAGNOSIS — Z86010 Personal history of colonic polyps: Secondary | ICD-10-CM | POA: Insufficient documentation

## 2014-09-30 ENCOUNTER — Encounter: Payer: Self-pay | Admitting: Internal Medicine

## 2014-10-14 ENCOUNTER — Ambulatory Visit (INDEPENDENT_AMBULATORY_CARE_PROVIDER_SITE_OTHER): Payer: Commercial Managed Care - HMO | Admitting: General Practice

## 2014-10-14 DIAGNOSIS — Z5181 Encounter for therapeutic drug level monitoring: Secondary | ICD-10-CM | POA: Diagnosis not present

## 2014-10-14 LAB — POCT INR: INR: 2.5

## 2014-10-14 NOTE — Progress Notes (Signed)
Pre visit review using our clinic review tool, if applicable. No additional management support is needed unless otherwise documented below in the visit note. 

## 2014-10-18 ENCOUNTER — Ambulatory Visit (INDEPENDENT_AMBULATORY_CARE_PROVIDER_SITE_OTHER): Payer: Commercial Managed Care - HMO | Admitting: *Deleted

## 2014-10-18 ENCOUNTER — Encounter: Payer: Self-pay | Admitting: Internal Medicine

## 2014-10-18 DIAGNOSIS — Z950 Presence of cardiac pacemaker: Secondary | ICD-10-CM

## 2014-10-18 NOTE — Progress Notes (Signed)
Remote pacemaker transmission.   

## 2014-10-21 LAB — CUP PACEART REMOTE DEVICE CHECK
Battery Voltage: 2.81 V
Brady Statistic AP VP Percent: 2.21 %
Brady Statistic AP VS Percent: 0.05 %
Brady Statistic AS VP Percent: 55.99 %
Brady Statistic AS VS Percent: 41.75 %
Brady Statistic RA Percent Paced: 2.26 %
Brady Statistic RV Percent Paced: 58.21 %
Date Time Interrogation Session: 20160516151754
Lead Channel Impedance Value: 416 Ohm
Lead Channel Impedance Value: 432 Ohm
Lead Channel Sensing Intrinsic Amplitude: 2.428 mV
Lead Channel Setting Pacing Amplitude: 2 V
Lead Channel Setting Pacing Amplitude: 2.5 V
Lead Channel Setting Pacing Pulse Width: 0.4 ms
Lead Channel Setting Sensing Sensitivity: 0.9 mV
Zone Setting Detection Interval: 350 ms
Zone Setting Detection Interval: 400 ms

## 2014-11-03 ENCOUNTER — Other Ambulatory Visit: Payer: Self-pay

## 2014-11-03 ENCOUNTER — Encounter: Payer: Self-pay | Admitting: Cardiology

## 2014-11-03 MED ORDER — ATORVASTATIN CALCIUM 20 MG PO TABS: ORAL_TABLET | ORAL | Status: DC

## 2014-11-15 DIAGNOSIS — Z85828 Personal history of other malignant neoplasm of skin: Secondary | ICD-10-CM | POA: Diagnosis not present

## 2014-11-15 DIAGNOSIS — L821 Other seborrheic keratosis: Secondary | ICD-10-CM | POA: Diagnosis not present

## 2014-11-15 DIAGNOSIS — L905 Scar conditions and fibrosis of skin: Secondary | ICD-10-CM | POA: Diagnosis not present

## 2014-11-18 ENCOUNTER — Ambulatory Visit (INDEPENDENT_AMBULATORY_CARE_PROVIDER_SITE_OTHER): Payer: Commercial Managed Care - HMO | Admitting: *Deleted

## 2014-11-18 DIAGNOSIS — I495 Sick sinus syndrome: Secondary | ICD-10-CM

## 2014-11-18 NOTE — Progress Notes (Signed)
Remote pacemaker transmission.   

## 2014-11-19 ENCOUNTER — Other Ambulatory Visit: Payer: Self-pay | Admitting: *Deleted

## 2014-11-19 MED ORDER — METOPROLOL SUCCINATE ER 50 MG PO TB24: ORAL_TABLET | ORAL | Status: DC

## 2014-11-24 LAB — CUP PACEART REMOTE DEVICE CHECK
Brady Statistic AP VP Percent: 0.15 %
Brady Statistic AP VS Percent: 0 %
Brady Statistic AS VP Percent: 52.82 %
Brady Statistic RV Percent Paced: 52.96 %
Lead Channel Impedance Value: 456 Ohm
Lead Channel Setting Pacing Amplitude: 2.5 V
Lead Channel Setting Pacing Pulse Width: 0.4 ms
Lead Channel Setting Sensing Sensitivity: 0.9 mV
MDC IDC MSMT BATTERY VOLTAGE: 2.81 V
MDC IDC MSMT LEADCHNL RA IMPEDANCE VALUE: 512 Ohm
MDC IDC SESS DTM: 20160616123946
MDC IDC SET ZONE DETECTION INTERVAL: 350 ms
MDC IDC STAT BRADY AS VS PERCENT: 47.04 %
MDC IDC STAT BRADY RA PERCENT PACED: 0.15 %
Zone Setting Detection Interval: 400 ms

## 2014-11-25 ENCOUNTER — Ambulatory Visit (INDEPENDENT_AMBULATORY_CARE_PROVIDER_SITE_OTHER): Payer: Commercial Managed Care - HMO | Admitting: General Practice

## 2014-11-25 DIAGNOSIS — Z5181 Encounter for therapeutic drug level monitoring: Secondary | ICD-10-CM

## 2014-11-25 DIAGNOSIS — I4891 Unspecified atrial fibrillation: Secondary | ICD-10-CM

## 2014-11-25 LAB — POCT INR: INR: 2.4

## 2014-11-25 NOTE — Progress Notes (Signed)
Pre visit review using our clinic review tool, if applicable. No additional management support is needed unless otherwise documented below in the visit note. 

## 2014-11-30 ENCOUNTER — Encounter: Payer: Self-pay | Admitting: Nurse Practitioner

## 2014-11-30 NOTE — Progress Notes (Signed)
Electrophysiology Office Note Date: 12/01/2014  ID:  Shawn [name], DOB Aug 16, 1934, MRN [id number]  PCP: Kennyth Arnold, FNP Primary Cardiologist: [place] Electrophysiologist: Lovena Le  CC: Pacemaker at elective replacement indicator  Shawn [name] is a 80 y.o. male seen today for Dr Lovena Le.  He underwent PPM implantation in 2008 for tachy/brady syndrome with subsequent pericardial effusion and micro-perforation requiring lead revision.  He has done well since and has developed permanent atrial fibrillation.  His pacemaker has recently been found to have reached elective replacement indicator and he presents today to discuss generator change.  Since last being seen in our clinic, the patient reports doing very well.  He denies chest pain, palpitations, dyspnea, PND, orthopnea, nausea, vomiting, dizziness, syncope.  His wife passed away 15 months ago.  He has 2 sons that live nearby and 4 grandchildren.   Device History: MDT dual chamber PPM implanted 2008 for tachy-brady syndrome.  RA and RV lead revision 11 days after implant 2/2 microperforation and pericardial effusion   Past Medical History  Diagnosis Date  . Actinic keratosis   . ALLERGIC RHINITIS   . Permanent atrial fibrillation   . CAD, NATIVE VESSEL     a. DES x2 to RCA 2004 b. repeat PTCA to RCA 2008 c. PCI to RCA 2012 non-DES   . Cellulitis and abscess of trunk   . HYPERLIPIDEMIA   . HYPERTENSION   . OTH MALIG NEOPLASM SKIN OTH\T\UNSPEC PARTS FACE   . GERD (gastroesophageal reflux disease)   . Tachy-brady syndrome     a. s/p MDT dual chamber pacemaker implant complicated by pericardial effusion and micro-perforatation s/p lead revisions   Past Surgical History  Procedure Laterality Date  . Cystectomy      from right side of face.  . Cataract extraction w/ intraocular lens  implant, bilateral  2012  . Back surgery  2000    "for bulging discs"  . Pacemaker insertion  2008    MDT dual chamber pacemaker  implanted by Dr Percival Spanish  . Coronary angioplasty with stent placement  2008  . Coronary angioplasty with stent placement  05/14/11  . Left heart catheterization with coronary angiogram N/A 05/14/2011    Procedure: LEFT HEART CATHETERIZATION WITH CORONARY ANGIOGRAM;  Surgeon: Hillary Bow, MD;  Location: [facility] CATH LAB;  Service: Cardiovascular;  Laterality: N/A;  . Lead revision  2008    11 days post initial implant, RA and RV lead revisions for micro-perforation and pericardial effusion by Dr Lovena Le    Current Outpatient Prescriptions  Medication Sig Dispense Refill  . aspirin EC 81 MG tablet Take 81 mg by mouth daily.    Marland Kitchen atorvastatin (LIPITOR) 20 MG tablet Take 1 tablet by mouth  every day 90 tablet 3  . diltiazem (CARDIZEM LA) 240 MG 24 hr tablet Take 1 tablet (240 mg  total) by mouth daily. 90 tablet 3  . fluticasone (FLONASE) 50 MCG/ACT nasal spray Place 2 sprays into both nostrils daily. 48 g 3  . metoprolol succinate (TOPROL-XL) 50 MG 24 hr tablet One and one half tablets once daily 135 tablet 0  . nitroGLYCERIN (NITROSTAT) 0.4 MG SL tablet Place 1 tablet (0.4 mg total) under the tongue every 5 (five) minutes as needed. 25 tablet 3  . omeprazole (PRILOSEC) 20 MG capsule Take 1 capsule (20 mg total) by mouth daily. 90 capsule 3  . tamsulosin (FLOMAX) 0.4 MG CAPS capsule Take 1 capsule (0.4 mg  total) by mouth daily. 90 capsule  0  . warfarin (COUMADIN) 2.5 MG tablet Take as directed by the Coumadin Clinic 135 tablet 3   No current facility-administered medications for this visit.    Allergies:   Review of patient's allergies indicates no known allergies.   Social History: History   Social History  . Marital Status: Married    Spouse Name: N/A  . Number of Children: N/A  . Years of Education: N/A   Occupational History  . Not on file.   Social History Main Topics  . Smoking status: Never Smoker   . Smokeless tobacco: Never Used  . Alcohol Use: Yes     Comment:  05/14/11 "last occasional drink was in 1990's"  . Drug Use: No  . Sexual Activity: Yes   Other Topics Concern  . Not on file   Social History Narrative    Family History: Family History  Problem Relation Age of Onset  . Colon cancer Other   . Cancer Mother   . Heart attack Father      Review of Systems: All other systems reviewed and are otherwise negative except as noted above.   Physical Exam: VS:  BP 122/60 mmHg  Pulse 66  Ht 5\' 6"  (1.676 m) , BMI There is no weight on file to calculate BMI.  GEN- The patient is well appearing, alert and oriented x 3 today.   HEENT: normocephalic, atraumatic; sclera clear, conjunctiva pink; hearing intact; oropharynx clear; neck supple Lungs- Clear to ausculation bilaterally, normal work of breathing.  No wheezes, rales, rhonchi Heart- Irregular rate and rhythm, no murmurs, rubs or gallops GI- soft, non-tender, non-distended, bowel sounds present Extremities- no clubbing, cyanosis, or edema; DP/PT/radial pulses 2+ bilaterally MS- no significant deformity or atrophy Skin- warm and dry, no rash or lesion; PPM pocket well healed Psych- euthymic mood, full affect Neuro- strength and sensation are intact  PPM Interrogation- reviewed in detail today,  See PACEART report  EKG:  EKG is ordered today. The ekg ordered today shows atrial fibrillation, rate 66, intermittent ventricular pacing  Recent Labs: 05/17/2014: ALT 21; BUN 15; Creatinine, Ser 1.1; Potassium 4.1; Sodium 138   Wt Readings from Last 3 Encounters:  09/09/14 207 lb (93.895 kg)  06/15/14 207 lb (93.895 kg)  05/18/14 203 lb 6.4 oz (92.262 kg)     Other studies Reviewed: Additional studies/ records that were reviewed today include: Dr Tanna Furry office notes, hospital records  Assessment and Plan:  1.  Symptomatic bradycardia PPM at ERI - device reprogrammed to VVIR today Risks, benefits to generator change discussed with patient who wishes to proceed.  Will hold  Coumadin for 2 days prior to implant With now permanent AF, a single chamber PPM at change out is appropriate  2.  Permanent atrial fibrillation Continue Warfarin for CHADS2VASC of at least 4 Rate well controlled by device interrogation  3.  CAD No recent ischemic symptoms Continue ASA/BB/statin  4.  HTN Stable No change required today   Current medicines are reviewed at length with the patient today.   The patient does not have concerns regarding his medicines.  The following changes were made today:  none  Labs/ tests ordered today include: pre-procedure labs   Disposition:   Follow up with Dr Lovena Le after generator change     Signed, Chanetta Marshall, NP 12/01/2014 4:24 PM  [place] [address] [place] [address] [phone] (office) [phone] (fax)

## 2014-12-01 ENCOUNTER — Ambulatory Visit (INDEPENDENT_AMBULATORY_CARE_PROVIDER_SITE_OTHER): Payer: Commercial Managed Care - HMO | Admitting: Nurse Practitioner

## 2014-12-01 ENCOUNTER — Encounter: Payer: Self-pay | Admitting: Nurse Practitioner

## 2014-12-01 VITALS — BP 122/60 | HR 66 | Ht 66.0 in

## 2014-12-01 DIAGNOSIS — R001 Bradycardia, unspecified: Secondary | ICD-10-CM

## 2014-12-01 DIAGNOSIS — I10 Essential (primary) hypertension: Secondary | ICD-10-CM

## 2014-12-01 DIAGNOSIS — I482 Chronic atrial fibrillation: Secondary | ICD-10-CM | POA: Diagnosis not present

## 2014-12-01 DIAGNOSIS — I4821 Permanent atrial fibrillation: Secondary | ICD-10-CM

## 2014-12-01 NOTE — Patient Instructions (Signed)
Medication Instructions:  Your physician recommends that you continue on your current medications as directed. Please refer to the Current Medication list given to you today.  Labwork: Your physician recommends that you have lab work today: BMP, CBC and PT/INR  Testing/Procedures: Your physician has recommended a pacemaker generator change.  Please follow pre-procedure instructions.   Follow-Up: We will arrange further follow-up after procedure.   Any Other Special Instructions Will Be Listed Below (If Applicable).

## 2014-12-02 ENCOUNTER — Telehealth: Payer: Self-pay | Admitting: Internal Medicine

## 2014-12-02 ENCOUNTER — Telehealth: Payer: Self-pay | Admitting: Family

## 2014-12-02 LAB — BASIC METABOLIC PANEL
BUN: 15 mg/dL (ref 6–23)
CO2: 30 meq/L (ref 19–32)
CREATININE: 1.29 mg/dL (ref 0.40–1.50)
Calcium: 9.8 mg/dL (ref 8.4–10.5)
Chloride: 106 mEq/L (ref 96–112)
GFR: 57.27 mL/min — AB (ref >60.00)
GLUCOSE: 90 mg/dL (ref 70–99)
Potassium: 4.5 mEq/L (ref 3.5–5.1)
Sodium: 141 mEq/L (ref 135–145)

## 2014-12-02 LAB — CBC
HCT: 46.6 % (ref 39.0–52.0)
HEMOGLOBIN: 15.7 g/dL (ref 13.0–17.0)
MCHC: 33.7 g/dL (ref 30.0–36.0)
MCV: 92.5 fl (ref 78.0–100.0)
Platelets: 165 10*3/uL (ref 150.0–400.0)
RBC: 5.04 Mil/uL (ref 4.22–5.81)
RDW: 13.7 % (ref 11.5–15.5)
WBC: 9.3 10*3/uL (ref 4.0–10.5)

## 2014-12-02 LAB — PROTIME-INR
INR: 3.2 ratio — AB (ref 0.8–1.0)
Prothrombin Time: 33.9 s — ABNORMAL HIGH (ref 9.6–13.1)

## 2014-12-02 NOTE — Telephone Encounter (Signed)
Shawn [name] will call patient to come in on 12th to get INR drawn

## 2014-12-02 NOTE — Telephone Encounter (Signed)
Pt's recent PT/INR was 33.9/3.2. Seems out of range for this patient and they would like someone to review. Pt is scheduled for next draw on 07/11 at 1PM.

## 2014-12-02 NOTE — Telephone Encounter (Signed)
Patient notified of lab results and notation from Chanetta Marshall, NP - "Notes Recorded by Patsey Berthold, NP on 12/02/2014 at 10:20 AM. Please notify patient of stable pre-procedure labs. He will need INR check 2 days prior to [facility] generator change. Please arrange. Thank you!".  Patient verbalized understanding and agreement to continue with current treatment plan.  Called [facility] to schedule INR check on 12/14/14 and provide them with results from INR and Prothrombin Time from yesterday. Stated that they can see patient for INR on 7/11 at 1:00 pm, but not on 7/12. Will route to Dr. Lovena Le and Chanetta Marshall, NP, as Juluis Rainier and to see if other arrangements should be made for INR draw elsewhere on 7/12. Called patient to inform him of July 11th appt at 1:00 pm. He states he is unable to do it on July 11th. Calling [facility] again to discuss options. Dorna Bloom stated they will call patient at this time to make arrangements for him to get the INR re-check on July 12th, as requested.

## 2014-12-02 NOTE — Telephone Encounter (Signed)
Follow up: ° ° ° °Pt returning this office call please give him a call back.   °

## 2014-12-08 LAB — CUP PACEART INCLINIC DEVICE CHECK
Date Time Interrogation Session: 20160706122140
Lead Channel Setting Pacing Amplitude: 2.5 V
Lead Channel Setting Pacing Pulse Width: 0.4 ms
MDC IDC SET LEADCHNL RV SENSING SENSITIVITY: 0.9 mV
Zone Setting Detection Interval: 350 ms
Zone Setting Detection Interval: 400 ms

## 2014-12-13 ENCOUNTER — Ambulatory Visit: Payer: Commercial Managed Care - HMO

## 2014-12-13 ENCOUNTER — Telehealth: Payer: Self-pay | Admitting: Internal Medicine

## 2014-12-13 NOTE — Telephone Encounter (Signed)
New message     Pt is to have procedure on Thursday at [place].  Pt has a cold and wants to know if the cold will interfere with having procedure done. Please call to advise

## 2014-12-13 NOTE — Telephone Encounter (Signed)
Patient calling to confirm that he can still undergo procedure this coming Thursday for a [facility] Generator Changeout, as he has a cold (for about one week now). Statues he does not have a fever, dyspnea, SOB or N/V. States it seems mostly just mild wheezing, no other notable symptoms. He also reports he is "feeling some better today than I have been feeling".   Will route to Dr. Lovena Le. Advised patient that should he develop fever or nausea/vomiting or worsening symptoms to let us know and meanwhile message will be routed to Dr. Lovena Le for his review.

## 2014-12-14 ENCOUNTER — Ambulatory Visit (INDEPENDENT_AMBULATORY_CARE_PROVIDER_SITE_OTHER): Payer: Commercial Managed Care - HMO | Admitting: Family

## 2014-12-14 ENCOUNTER — Encounter: Payer: Self-pay | Admitting: Family

## 2014-12-14 DIAGNOSIS — I4891 Unspecified atrial fibrillation: Secondary | ICD-10-CM

## 2014-12-14 DIAGNOSIS — Z5181 Encounter for therapeutic drug level monitoring: Secondary | ICD-10-CM

## 2014-12-14 LAB — POCT INR: INR: 2.8

## 2014-12-14 NOTE — Progress Notes (Signed)
   Subjective:    Patient ID: Shawn [name], male    DOB: March 23, 1935, 80 y.o.   MRN: [id number]  HPI  PT/INR   Review of Systems     Objective:   Physical Exam   No OV, just pt     Assessment & Plan:

## 2014-12-14 NOTE — Patient Instructions (Signed)
Continue 2.5 mg all days except 5 mg on M/W/F.  Recheck in 6 weeks  Anticoagulation Dose Instructions as of 12/14/2014      Shawn [name] Tue Wed Thu Fri Sat   New Dose 2.5 mg 5 mg 2.5 mg 5 mg 2.5 mg 5 mg 2.5 mg    Description        Continue 2.5 mg all days except 5 mg on M/W/F.  Recheck in 6 weeks

## 2014-12-15 ENCOUNTER — Encounter: Payer: Self-pay | Admitting: Internal Medicine

## 2014-12-15 MED ORDER — SODIUM CHLORIDE 0.9 % IR SOLN
80.0000 mg | Status: AC
  Administered 2014-12-16: 08:00:00
  Filled 2014-12-15: qty 2

## 2014-12-15 MED ORDER — MUPIROCIN 2 % EX OINT
1.0000 "application " | TOPICAL_OINTMENT | Freq: Once | CUTANEOUS | Status: AC
  Administered 2014-12-16: 1 via TOPICAL
  Filled 2014-12-15: qty 22

## 2014-12-15 MED ORDER — GENTAMICIN SULFATE 40 MG/ML IJ SOLN
80.0000 mg | INTRAMUSCULAR | Status: DC
  Filled 2014-12-15: qty 2

## 2014-12-15 MED ORDER — SODIUM CHLORIDE 0.9 % IV SOLN
INTRAVENOUS | Status: DC
  Administered 2014-12-16: 07:00:00 via INTRAVENOUS

## 2014-12-15 MED ORDER — SODIUM CHLORIDE 0.9 % IR SOLN
80.0000 mg | Status: DC
  Filled 2014-12-15: qty 2

## 2014-12-15 MED ORDER — CEFAZOLIN SODIUM-DEXTROSE 2-3 GM-% IV SOLR
2.0000 g | INTRAVENOUS | Status: AC
  Administered 2014-12-16: 2 g via INTRAVENOUS

## 2014-12-16 ENCOUNTER — Encounter (HOSPITAL_COMMUNITY)
Admission: RE | Disposition: A | Discharge status: Home / Self Care | Payer: Self-pay | Source: Ambulatory Visit | Attending: Internal Medicine

## 2014-12-16 ENCOUNTER — Ambulatory Visit (HOSPITAL_COMMUNITY)
Admission: RE | Admit: 2014-12-16 | Discharge: 2014-12-16 | Disposition: A | Discharge status: Home / Self Care | Payer: Commercial Managed Care - HMO | Source: Ambulatory Visit | Attending: Internal Medicine | Admitting: Internal Medicine

## 2014-12-16 DIAGNOSIS — Z45010 Encounter for checking and testing of cardiac pacemaker pulse generator [battery]: Secondary | ICD-10-CM

## 2014-12-16 DIAGNOSIS — I442 Atrioventricular block, complete: Secondary | ICD-10-CM

## 2014-12-16 DIAGNOSIS — Z955 Presence of coronary angioplasty implant and graft: Secondary | ICD-10-CM | POA: Insufficient documentation

## 2014-12-16 DIAGNOSIS — Z7982 Long term (current) use of aspirin: Secondary | ICD-10-CM | POA: Diagnosis not present

## 2014-12-16 DIAGNOSIS — K219 Gastro-esophageal reflux disease without esophagitis: Secondary | ICD-10-CM | POA: Diagnosis not present

## 2014-12-16 DIAGNOSIS — E785 Hyperlipidemia, unspecified: Secondary | ICD-10-CM | POA: Diagnosis not present

## 2014-12-16 DIAGNOSIS — Z7901 Long term (current) use of anticoagulants: Secondary | ICD-10-CM | POA: Diagnosis not present

## 2014-12-16 DIAGNOSIS — I482 Chronic atrial fibrillation: Secondary | ICD-10-CM | POA: Insufficient documentation

## 2014-12-16 DIAGNOSIS — I2510 Atherosclerotic heart disease of native coronary artery without angina pectoris: Secondary | ICD-10-CM | POA: Insufficient documentation

## 2014-12-16 DIAGNOSIS — I10 Essential (primary) hypertension: Secondary | ICD-10-CM | POA: Insufficient documentation

## 2014-12-16 HISTORY — PX: EP IMPLANTABLE DEVICE: SHX172B

## 2014-12-16 LAB — BASIC METABOLIC PANEL
ANION GAP: 9 (ref 5–15)
BUN: 10 mg/dL (ref 6–20)
CHLORIDE: 107 mmol/L (ref 101–111)
CO2: 24 mmol/L (ref 22–32)
Calcium: 8.7 mg/dL — ABNORMAL LOW (ref 8.9–10.3)
Creatinine, Ser: 1.2 mg/dL (ref 0.61–1.24)
GFR calc Af Amer: >60 mL/min (ref >60)
GFR calc non Af Amer: 57 mL/min — ABNORMAL LOW (ref >60)
GLUCOSE: 124 mg/dL — AB (ref 65–99)
Potassium: 4.1 mmol/L (ref 3.5–5.1)
Sodium: 140 mmol/L (ref 135–145)

## 2014-12-16 LAB — CBC
HCT: 43.9 % (ref 39.0–52.0)
Hemoglobin: 15.3 g/dL (ref 13.0–17.0)
MCH: 31.3 pg (ref 26.0–34.0)
MCHC: 34.9 g/dL (ref 30.0–36.0)
MCV: 89.8 fL (ref 78.0–100.0)
PLATELETS: 146 10*3/uL — AB (ref 150–400)
RBC: 4.89 MIL/uL (ref 4.22–5.81)
RDW: 12.9 % (ref 11.5–15.5)
WBC: 7.2 10*3/uL (ref 4.0–10.5)

## 2014-12-16 LAB — SURGICAL PCR SCREEN
MRSA, PCR: NEGATIVE
STAPHYLOCOCCUS AUREUS: POSITIVE — AB

## 2014-12-16 LAB — PROTIME-INR
INR: 1.67 — ABNORMAL HIGH (ref 0.00–1.49)
Prothrombin Time: 19.7 seconds — ABNORMAL HIGH (ref 11.6–15.2)

## 2014-12-16 SURGERY — PPM/BIV PPM GENERATOR CHANGEOUT
Anesthesia: LOCAL

## 2014-12-16 MED ORDER — CHLORHEXIDINE GLUCONATE 4 % EX LIQD
60.0000 mL | Freq: Once | CUTANEOUS | Status: DC
Start: 2014-12-16 — End: 2014-12-16

## 2014-12-16 MED ORDER — MIDAZOLAM HCL 5 MG/5ML IJ SOLN
INTRAMUSCULAR | Status: AC
  Filled 2014-12-16: qty 5

## 2014-12-16 MED ORDER — SODIUM CHLORIDE 0.9 % IR SOLN
Status: AC
  Filled 2014-12-16: qty 2

## 2014-12-16 MED ORDER — MIDAZOLAM HCL 5 MG/5ML IJ SOLN
INTRAMUSCULAR | Status: DC | PRN
  Administered 2014-12-16: 1 mg via INTRAVENOUS

## 2014-12-16 MED ORDER — LIDOCAINE HCL (PF) 1 % IJ SOLN
INTRAMUSCULAR | Status: DC | PRN
  Administered 2014-12-16: 31 mL

## 2014-12-16 MED ORDER — LIDOCAINE HCL (PF) 1 % IJ SOLN
INTRAMUSCULAR | Status: AC
Start: 2014-12-16 — End: 2014-12-16
  Filled 2014-12-16: qty 30

## 2014-12-16 MED ORDER — LIDOCAINE HCL (PF) 1 % IJ SOLN
INTRAMUSCULAR | Status: AC
  Filled 2014-12-16: qty 30

## 2014-12-16 MED ORDER — MUPIROCIN 2 % EX OINT
TOPICAL_OINTMENT | CUTANEOUS | Status: AC
  Administered 2014-12-16: 1 via TOPICAL
  Filled 2014-12-16: qty 22

## 2014-12-16 MED ORDER — FENTANYL CITRATE (PF) 100 MCG/2ML IJ SOLN
INTRAMUSCULAR | Status: AC
  Filled 2014-12-16: qty 2

## 2014-12-16 SURGICAL SUPPLY — 4 items
CABLE SURGICAL S-101-97-12 (CABLE) ×2 IMPLANT
PAD DEFIB LIFELINK (PAD) ×2 IMPLANT
PPM SENSIA SR SESR01 (Pacemaker) ×2 IMPLANT
TRAY PACEMAKER INSERTION (CUSTOM PROCEDURE TRAY) ×2 IMPLANT

## 2014-12-16 NOTE — Discharge Instructions (Signed)
Pacemaker Battery Change, Care After °Refer to this sheet in the next few weeks. These instructions provide you with information on caring for yourself after your procedure. Your health care provider may also give you more specific instructions. Your treatment has been planned according to current medical practices, but problems sometimes occur. Call your health care provider if you have any problems or questions after your procedure. °WHAT TO EXPECT AFTER THE PROCEDURE °After your procedure, it is typical to have the following sensations: °· Soreness at the pacemaker site. °HOME CARE INSTRUCTIONS  °· Keep the incision clean and dry. °· Unless advised otherwise, you may shower beginning 48 hours after your procedure. °· For the first week after the replacement, avoid stretching motions that pull at the incision site, and avoid heavy exercise with the arm that is on the same side as the incision. °· Take medicines only as directed by your health care provider. °· Keep all follow-up visits as directed by your health care provider. °SEEK MEDICAL CARE IF:  °· You have pain at the incision site that is not relieved by over-the-counter or prescription medicine. °· There is drainage or pus from the incision site. °· There is swelling larger than a lime at the incision site. °· You develop red streaking that extends above or below the incision site. °· You feel brief, intermittent palpitations, light-headedness, or any symptoms that you feel might be related to your heart. °SEEK IMMEDIATE MEDICAL CARE IF:  °· You experience chest pain that is different than the pain at the pacemaker site. °· You experience shortness of breath. °· You have palpitations or irregular heartbeat. °· You have light-headedness that does not go away quickly. °· You faint. °· You have pain that gets worse and is not relieved by medicine. °Document Released: 03/11/2013 Document Revised: 10/05/2013 Document Reviewed: 03/11/2013 °[employer]® Patient  Information ©2015 [employer]. This information is not intended to replace advice given to you by your health care provider. Make sure you discuss any questions you have with your health care provider. ° °

## 2014-12-16 NOTE — H&P (Addendum)
Electrophysiology Office Note Date: 12/01/2014  ID: Shawn [name], DOB [birth year], MRN [id number]  PCP: Kennyth Arnold, FNP Primary Cardiologist: Hochrein Electrophysiologist: Lovena Le  CC: Pacemaker at elective replacement indicator  Shawn [name] is a 80 y.o. male seen today for Dr Lovena Le. He underwent PPM implantation in 2008 for tachy/brady syndrome with subsequent pericardial effusion and micro-perforation requiring lead revision. He has done well since and has developed permanent atrial fibrillation. His pacemaker has recently been found to have reached elective replacement indicator and he presents today to discuss generator change. Since last being seen in our clinic, the patient reports doing very well. He denies chest pain, palpitations, dyspnea, PND, orthopnea, nausea, vomiting, dizziness, syncope.  His wife passed away 15 months ago. He has 2 sons that live nearby and 4 grandchildren.   Device History: MDT dual chamber PPM implanted 2008 for tachy-brady syndrome. RA and RV lead revision 11 days after implant 2/2 microperforation and pericardial effusion   Past Medical History  Diagnosis Date  . Actinic keratosis   . ALLERGIC RHINITIS   . Permanent atrial fibrillation   . CAD, NATIVE VESSEL     a. DES x2 to RCA 2004 b. repeat PTCA to RCA 2008 c. PCI to RCA 2012 non-DES   . Cellulitis and abscess of trunk   . HYPERLIPIDEMIA   . HYPERTENSION   . OTH MALIG NEOPLASM SKIN OTH\T\UNSPEC PARTS FACE   . GERD (gastroesophageal reflux disease)   . Tachy-brady syndrome     a. s/p MDT dual chamber pacemaker implant complicated by pericardial effusion and micro-perforatation s/p lead revisions   Past Surgical History  Procedure Laterality Date  . Cystectomy      from right side of face.  . Cataract extraction w/ intraocular lens implant, bilateral  2012  . Back surgery  2000    "for bulging discs"  .  Pacemaker insertion  2008    MDT dual chamber pacemaker implanted by Dr Percival Spanish  . Coronary angioplasty with stent placement  2008  . Coronary angioplasty with stent placement  05/14/11  . Left heart catheterization with coronary angiogram N/A 05/14/2011    Procedure: LEFT HEART CATHETERIZATION WITH CORONARY ANGIOGRAM; Surgeon: Hillary Bow, MD; Location: [facility] CATH LAB; Service: Cardiovascular; Laterality: N/A;  . Lead revision  2008    11 days post initial implant, RA and RV lead revisions for micro-perforation and pericardial effusion by Dr Lovena Le    Current Outpatient Prescriptions  Medication Sig Dispense Refill  . aspirin EC 81 MG tablet Take 81 mg by mouth daily.    Marland Kitchen atorvastatin (LIPITOR) 20 MG tablet Take 1 tablet by mouth every day 90 tablet 3  . diltiazem (CARDIZEM LA) 240 MG 24 hr tablet Take 1 tablet (240 mg total) by mouth daily. 90 tablet 3  . fluticasone (FLONASE) 50 MCG/ACT nasal spray Place 2 sprays into both nostrils daily. 48 g 3  . metoprolol succinate (TOPROL-XL) 50 MG 24 hr tablet One and one half tablets once daily 135 tablet 0  . nitroGLYCERIN (NITROSTAT) 0.4 MG SL tablet Place 1 tablet (0.4 mg total) under the tongue every 5 (five) minutes as needed. 25 tablet 3  . omeprazole (PRILOSEC) 20 MG capsule Take 1 capsule (20 mg total) by mouth daily. 90 capsule 3  . tamsulosin (FLOMAX) 0.4 MG CAPS capsule Take 1 capsule (0.4 mg total) by mouth daily. 90 capsule 0  . warfarin (COUMADIN) 2.5 MG tablet Take as directed by the Coumadin Clinic 135 tablet 3  No current facility-administered medications for this visit.    Allergies: Review of patient's allergies indicates no known allergies.   Social History: History   Social History  . Marital Status: Married    Spouse Name: N/A  . Number of Children: N/A  . Years of Education: N/A   Occupational  History  . Not on file.   Social History Main Topics  . Smoking status: Never Smoker   . Smokeless tobacco: Never Used  . Alcohol Use: Yes     Comment: 05/14/11 "last occasional drink was in 1990's"  . Drug Use: No  . Sexual Activity: Yes   Other Topics Concern  . Not on file   Social History Narrative    Family History: Family History  Problem Relation Age of Onset  . Colon cancer Other   . Cancer Mother   . Heart attack Father      Review of Systems: All other systems reviewed and are otherwise negative except as noted above.   Physical Exam: VS: BP 122/60 mmHg  Pulse 66  Ht 5\' 6"  (1.676 m) , BMI There is no weight on file to calculate BMI.  GEN- The patient is well appearing, alert and oriented x 3 today.  HEENT: normocephalic, atraumatic; sclera clear, conjunctiva pink; hearing intact; oropharynx clear; neck supple Lungs- Clear to ausculation bilaterally, normal work of breathing. No wheezes, rales, rhonchi Heart- Irregular rate and rhythm, no murmurs, rubs or gallops GI- soft, non-tender, non-distended, bowel sounds present Extremities- no clubbing, cyanosis, or edema; DP/PT/radial pulses 2+ bilaterally MS- no significant deformity or atrophy Skin- warm and dry, no rash or lesion; PPM pocket well healed Psych- euthymic mood, full affect Neuro- strength and sensation are intact  PPM Interrogation- reviewed in detail today, See PACEART report  EKG: EKG is ordered today. The ekg ordered today shows atrial fibrillation, rate 66, intermittent ventricular pacing  Recent Labs: 05/17/2014: ALT 21; BUN 15; Creatinine, Ser 1.1; Potassium 4.1; Sodium 138   Wt Readings from Last 3 Encounters:  09/09/14 207 lb (93.895 kg)  06/15/14 207 lb (93.895 kg)  05/18/14 203 lb 6.4 oz (92.262 kg)     Other studies Reviewed: Additional studies/ records that were reviewed today include: Dr Tanna Furry office notes,  hospital records  Assessment and Plan:  1. Symptomatic bradycardia due to complete heart block PPM at ERI - device reprogrammed to VVIR today Risks, benefits to generator change discussed with patient who wishes to proceed.  Will hold Coumadin for 2 days prior to implant With now permanent AF, a single chamber PPM at change out is appropriate  2. Permanent atrial fibrillation Continue Warfarin for CHADS2VASC of at least 4 Rate well controlled by device interrogation  3. CAD No recent ischemic symptoms Continue ASA/BB/statin  4. HTN Stable No change required today        EP Attending  Patient seen and examined. Agree with above. No changes since last clinic visit. He is stable and will plan to remove his old DDD PM and place a VVI PM due to chronic atrial fibrillation.   Mikle Bosworth.D.

## 2014-12-17 ENCOUNTER — Encounter (HOSPITAL_COMMUNITY): Payer: Self-pay | Admitting: Internal Medicine

## 2014-12-20 ENCOUNTER — Telehealth: Payer: Self-pay | Admitting: Internal Medicine

## 2014-12-20 NOTE — Telephone Encounter (Signed)
Instructed patient he may remove bandage and take a shower.  Patient grateful for callback.

## 2014-12-20 NOTE — Telephone Encounter (Signed)
New message    Pt had procedure done Thursday morning.   Pt needs to know whether he can take off his bandage today and when can he take a shower.   Please call to discuss

## 2015-01-06 ENCOUNTER — Ambulatory Visit (INDEPENDENT_AMBULATORY_CARE_PROVIDER_SITE_OTHER): Payer: Commercial Managed Care - HMO | Admitting: General Practice

## 2015-01-06 DIAGNOSIS — I4891 Unspecified atrial fibrillation: Secondary | ICD-10-CM

## 2015-01-06 DIAGNOSIS — Z5181 Encounter for therapeutic drug level monitoring: Secondary | ICD-10-CM

## 2015-01-06 LAB — POCT INR: INR: 3.4

## 2015-01-06 NOTE — Progress Notes (Signed)
Agree with recommendations.  

## 2015-01-06 NOTE — Progress Notes (Signed)
Pre visit review using our clinic review tool, if applicable. No additional management support is needed unless otherwise documented below in the visit note. 

## 2015-01-12 ENCOUNTER — Encounter: Payer: Self-pay | Admitting: Internal Medicine

## 2015-01-24 ENCOUNTER — Ambulatory Visit: Payer: Commercial Managed Care - HMO

## 2015-01-28 ENCOUNTER — Encounter: Payer: Self-pay | Admitting: Internal Medicine

## 2015-02-03 ENCOUNTER — Ambulatory Visit (INDEPENDENT_AMBULATORY_CARE_PROVIDER_SITE_OTHER): Payer: Commercial Managed Care - HMO | Admitting: General Practice

## 2015-02-03 DIAGNOSIS — Z5181 Encounter for therapeutic drug level monitoring: Secondary | ICD-10-CM | POA: Diagnosis not present

## 2015-02-03 DIAGNOSIS — I4891 Unspecified atrial fibrillation: Secondary | ICD-10-CM | POA: Diagnosis not present

## 2015-02-03 LAB — POCT INR: INR: 3.1

## 2015-02-03 NOTE — Progress Notes (Signed)
Pre visit review using our clinic review tool, if applicable. No additional management support is needed unless otherwise documented below in the visit note. 

## 2015-02-03 NOTE — Progress Notes (Signed)
Agree with Coumadin management recommendations

## 2015-03-03 ENCOUNTER — Ambulatory Visit: Payer: Commercial Managed Care - HMO

## 2015-03-07 ENCOUNTER — Ambulatory Visit (INDEPENDENT_AMBULATORY_CARE_PROVIDER_SITE_OTHER): Payer: Commercial Managed Care - HMO | Admitting: General Practice

## 2015-03-07 DIAGNOSIS — Z5181 Encounter for therapeutic drug level monitoring: Secondary | ICD-10-CM | POA: Diagnosis not present

## 2015-03-07 LAB — POCT INR: INR: 2.1

## 2015-03-07 NOTE — Progress Notes (Signed)
Pre visit review using our clinic review tool, if applicable. No additional management support is needed unless otherwise documented below in the visit note. 

## 2015-03-11 ENCOUNTER — Encounter: Payer: Self-pay | Admitting: Family Medicine

## 2015-03-11 ENCOUNTER — Ambulatory Visit (INDEPENDENT_AMBULATORY_CARE_PROVIDER_SITE_OTHER): Payer: Commercial Managed Care - HMO | Admitting: Family Medicine

## 2015-03-11 VITALS — BP 110/80 | HR 78 | Temp 98.3°F | Wt 203.0 lb

## 2015-03-11 DIAGNOSIS — D126 Benign neoplasm of colon, unspecified: Secondary | ICD-10-CM | POA: Diagnosis not present

## 2015-03-11 DIAGNOSIS — I2510 Atherosclerotic heart disease of native coronary artery without angina pectoris: Secondary | ICD-10-CM | POA: Diagnosis not present

## 2015-03-11 DIAGNOSIS — I10 Essential (primary) hypertension: Secondary | ICD-10-CM

## 2015-03-11 DIAGNOSIS — I480 Paroxysmal atrial fibrillation: Secondary | ICD-10-CM

## 2015-03-11 DIAGNOSIS — E785 Hyperlipidemia, unspecified: Secondary | ICD-10-CM | POA: Diagnosis not present

## 2015-03-11 DIAGNOSIS — M26621 Arthralgia of right temporomandibular joint: Secondary | ICD-10-CM

## 2015-03-11 DIAGNOSIS — Z23 Encounter for immunization: Secondary | ICD-10-CM

## 2015-03-11 MED ORDER — METOPROLOL SUCCINATE ER 50 MG PO TB24: ORAL_TABLET | ORAL | Status: DC

## 2015-03-11 MED ORDER — FLUTICASONE PROPIONATE 50 MCG/ACT NA SUSP: 2.0000 | Freq: Every day | NASAL | Status: DC

## 2015-03-11 NOTE — Assessment & Plan Note (Signed)
S: remains asymptomatic. Compliant with aspirin, statin, metoprolol A/P: continue current rx.

## 2015-03-11 NOTE — Assessment & Plan Note (Signed)
S: doing well on Diltiazem 240mg  long acting, metoprolol 75mg , warfarin. INR goal 2-3 and at goal A/P: continue current rx, actually appears to be in sinus rhythm today

## 2015-03-11 NOTE — Progress Notes (Signed)
Garret Reddish, MD  Subjective:  Shawn [name] is a 80 y.o. year old very pleasant male patient who presents for/with See problem oriented charting ROS- no chest pain or shortness of breath, no melena or BRBPR, some nocturia even on flomax  Past Medical History-  Patient Active Problem List   Diagnosis Date Noted  . Adenomatous colon polyp 09/29/2014    Priority: High  . CAD, NATIVE VESSEL 09/27/2008    Priority: High  . PACEMAKER, PERMANENT 03/11/2007    Priority: High  . Atrial fibrillation ([place]) 02/11/2007    Priority: High  . BPH associated with nocturia 09/09/2014    Priority: Medium  . Hyperlipidemia 03/10/2008    Priority: Medium  . Essential hypertension 12/02/2006    Priority: Medium  . Encounter for therapeutic drug monitoring 09/24/2013    Priority: Low  . Thrombocytopenia ([place]) 06/17/2011    Priority: Low  . Hx of skin cancer, basal cell 04/07/2008    Priority: Low  . Actinic keratosis 04/07/2008    Priority: Low  . Allergic rhinitis 12/02/2006    Priority: Low  . GERD 12/02/2006    Priority: Low    Medications- reviewed and updated Current Outpatient Prescriptions  Medication Sig Dispense Refill  . aspirin EC 81 MG tablet Take 81 mg by mouth daily.    Marland Kitchen atorvastatin (LIPITOR) 20 MG tablet Take 1 tablet by mouth  every day 90 tablet 3  . diltiazem (CARDIZEM LA) 240 MG 24 hr tablet Take 1 tablet (240 mg  total) by mouth daily. 90 tablet 3  . fluticasone (FLONASE) 50 MCG/ACT nasal spray Place 2 sprays into both nostrils daily. 48 g 3  . metoprolol succinate (TOPROL-XL) 50 MG 24 hr tablet One and one half tablets once daily 135 tablet 3  . Multiple Vitamins-Minerals (MULTIVITAMIN & MINERAL PO) Take 1 tablet by mouth daily.    . nitroGLYCERIN (NITROSTAT) 0.4 MG SL tablet Place 1 tablet (0.4 mg total) under the tongue every 5 (five) minutes as needed. 25 tablet 3  . omeprazole (PRILOSEC) 20 MG capsule Take 1 capsule (20 mg total) by mouth daily. (Patient not  taking: Reported on 12/15/2014) 90 capsule 3  . tamsulosin (FLOMAX) 0.4 MG CAPS capsule Take 1 capsule (0.4 mg  total) by mouth daily. 90 capsule 0  . warfarin (COUMADIN) 2.5 MG tablet Take as directed by the Coumadin Clinic (Patient taking differently: Take 2.5-5 mg by mouth daily. 2.5 mg daily except 5 mg on Mon, Wed, Fri) 135 tablet 3   No current facility-administered medications for this visit.    Objective: BP 110/80 mmHg  Pulse 78  Temp(Src) 98.3 F (36.8 C)  Wt 203 lb (92.08 kg) Gen: NAD, resting comfortably, appears stated age CV: RRR (appears to be in sinus) no murmurs rubs or gallops Lungs: CTAB no crackles, wheeze, rhonchi Abdomen: soft/nontender/nondistended/normal bowel sounds. No rebound or guarding.  Ext: 1+ edema Skin: warm, dry, several likely AKs on scalp (advised Derm follow up- has appointment soon) Neuro: grossly normal, moves all extremities  Assessment/Plan:  CAD, NATIVE VESSEL S: remains asymptomatic. Compliant with aspirin, statin, metoprolol A/P: continue current rx.    Hyperlipidemia S: controlled on atorvastatin 20mg  with LDL <60 last decembter A/P: too early for recheck- check at follow up in 6 months, continue current rx   Essential hypertension S: controlled on Diltiazem 240mg  LA, metoprolol 75mg  XL  BP Readings from Last 3 Encounters:  03/11/15 110/80  12/16/14 128/50  12/01/14 122/60  A/P: continue current  rx   Atrial fibrillation S: doing well on Diltiazem 240mg  long acting, metoprolol 75mg , warfarin. INR goal 2-3 and at goal A/P: continue current rx, actually appears to be in sinus rhythm today   Adenomatous colon polyp S: after last visit reviewed history. 2007 adenoma with advised 5 year follow up. Also family history colon cancer in father A/P:Discussed at next visit risk/beneft given other chronic medical conditions. Patient did well off coumadin recently for pacemaker procedure. He would like to proceed. Likely stop coumadin 5  days prior to procedure through coordination with coumadin clinic. Aware of risks particularly of stroke off medicine   Right jaw pain S: eating a burger about a month ago that was somewhat tough- had to chew a lot. After that has had some right jaw discomfort with chewing. No trouble talking. Not sure if grinds his teeth at night. Feels like clicking or sandpaper at times. Never had something like this before. Tylenol helps.  A/P: Suspect TMJD. Advised home remedies. Advised see dentist. Avoid nsaids given coumadin and CAD  6 months. Sooner Return precautions advised.   Orders Placed This Encounter  Procedures  . Flu Vaccine QUAD 36+ mos IM  . Ambulatory referral to Gastroenterology    Referral Priority:  Routine    Referral Type:  Consultation    Referral Reason:  Specialty Services Required    Number of Visits Requested:  1    Meds ordered this encounter  Medications  . metoprolol succinate (TOPROL-XL) 50 MG 24 hr tablet    Sig: One and one half tablets once daily    Dispense:  135 tablet    Refill:  3  . fluticasone (FLONASE) 50 MCG/ACT nasal spray    Sig: Place 2 sprays into both nostrils daily.    Dispense:  48 g    Refill:  3

## 2015-03-11 NOTE — Assessment & Plan Note (Signed)
S: after last visit reviewed history. 2007 adenoma with advised 5 year follow up. Also family history colon cancer in father A/P:Discussed at next visit risk/beneft given other chronic medical conditions. Patient did well off coumadin recently for pacemaker procedure. He would like to proceed. Likely stop coumadin 5 days prior to procedure through coordination with coumadin clinic. Aware of risks particularly of stroke off medicine

## 2015-03-11 NOTE — Patient Instructions (Addendum)
Flu shot received today.  Refer for colonoscopy- you will have to come off your coumadin for this- will coordinate with me and Jenny Reichmann when time comes. Does increase risk of stroke but great you did fine when you had the pacemaker work done.   Blood pressure looks great. No changes. Refilled metoprolol  Would check in with your dentist about the jaw discomfort. Suspect TMJD   Temporomandibular Joint Syndrome Temporomandibular joint (TMJ) syndrome is a condition that affects the joints between your jaw and your skull. The TMJs are located near your ears and allow your jaw to open and close. These joints and the nearby muscles are involved in all movements of the jaw. People with TMJ syndrome have pain in the area of these joints and muscles. Chewing, biting, or other movements of the jaw can be difficult or painful. TMJ syndrome can be caused by various things. In many cases, the condition is mild and goes away within a few weeks. For some people, the condition can become a long-term problem. CAUSES Possible causes of TMJ syndrome include:  Grinding your teeth or clenching your jaw. Some people do this when they are under stress.  Arthritis.  Injury to the jaw.  Head or neck injury.  Teeth or dentures that are not aligned well. In some cases, the cause of TMJ syndrome may not be known. SIGNS AND SYMPTOMS The most common symptom is an aching pain on the side of the head in the area of the TMJ. Other symptoms may include:  Pain when moving your jaw, such as when chewing or biting.  Being unable to open your jaw all the way.  Making a clicking sound when you open your mouth.  Headache.  Earache.  Neck or shoulder pain. DIAGNOSIS Diagnosis can usually be made based on your symptoms, your medical history, and a physical exam. Your health care provider may check the range of motion of your jaw. Imaging tests, such as X-rays or an MRI, are sometimes done. You may need to see your  dentist to determine if your teeth and jaw are lined up correctly. TREATMENT TMJ syndrome often goes away on its own. If treatment is needed, the options may include:  Eating soft foods and applying ice or heat.  Medicines to relieve pain or inflammation. Tylenol only  A splint, bite plate, or mouthpiece to prevent teeth grinding or jaw clenching.  Relaxation techniques or counseling to help reduce stress.  Acupuncture. This is sometimes helpful to relieve pain.  Jaw surgery. This is rarely needed. HOME CARE INSTRUCTIONS  Take medicines only as directed by your health care provider.  Eat a soft diet if you are having trouble chewing.  Apply ice to the painful area.  Put ice in a plastic bag.  Place a towel between your skin and the bag.  Leave the ice on for 20 minutes, 2-3 times a day.  Apply a warm compress to the painful area as directed.  Massage your jaw area and perform any jaw stretching exercises as recommended by your health care provider.  If you were given a mouthpiece or bite plate, wear it as directed.  Avoid foods that require a lot of chewing. Do not chew gum.  Keep all follow-up visits as directed by your health care provider. This is important. SEEK MEDICAL CARE IF:  You are having trouble eating.  You have new or worsening symptoms. SEEK IMMEDIATE MEDICAL CARE IF:  Your jaw locks open or closed.   This  information is not intended to replace advice given to you by your health care provider. Make sure you discuss any questions you have with your health care provider.   Document Released: 02/13/2001 Document Revised: 06/11/2014 Document Reviewed: 12/24/2013 Elsevier Interactive Patient Education [employer].

## 2015-03-11 NOTE — Assessment & Plan Note (Signed)
S: controlled on atorvastatin 20mg  with LDL <60 last decembter A/P: too early for recheck- check at follow up in 6 months, continue current rx

## 2015-03-11 NOTE — Assessment & Plan Note (Signed)
S: controlled on Diltiazem 240mg  LA, metoprolol 75mg  XL  BP Readings from Last 3 Encounters:  03/11/15 110/80  12/16/14 128/50  12/01/14 122/60  A/P: continue current rx

## 2015-03-18 ENCOUNTER — Encounter: Payer: Commercial Managed Care - HMO | Admitting: Internal Medicine

## 2015-03-29 ENCOUNTER — Encounter: Payer: Self-pay | Admitting: Internal Medicine

## 2015-03-29 ENCOUNTER — Ambulatory Visit (INDEPENDENT_AMBULATORY_CARE_PROVIDER_SITE_OTHER): Payer: Commercial Managed Care - HMO | Admitting: Internal Medicine

## 2015-03-29 VITALS — BP 138/68 | HR 83 | Ht 68.0 in | Wt 202.6 lb

## 2015-03-29 DIAGNOSIS — I10 Essential (primary) hypertension: Secondary | ICD-10-CM

## 2015-03-29 DIAGNOSIS — I480 Paroxysmal atrial fibrillation: Secondary | ICD-10-CM | POA: Diagnosis not present

## 2015-03-29 DIAGNOSIS — Z950 Presence of cardiac pacemaker: Secondary | ICD-10-CM | POA: Diagnosis not present

## 2015-03-29 LAB — CUP PACEART INCLINIC DEVICE CHECK
Battery Impedance: 107 Ohm
Battery Remaining Longevity: 127 mo
Implantable Lead Implant Date: 20081007
Implantable Lead Location: 753859
Implantable Lead Location: 753860
Implantable Lead Model: 5076
Implantable Lead Model: 5076
Lead Channel Impedance Value: 0 Ohm
Lead Channel Impedance Value: 521 Ohm
Lead Channel Pacing Threshold Pulse Width: 0.4 ms
Lead Channel Sensing Intrinsic Amplitude: 4 mV
Lead Channel Setting Pacing Pulse Width: 0.4 ms
MDC IDC LEAD IMPLANT DT: 20081007
MDC IDC MSMT BATTERY VOLTAGE: 2.79 V
MDC IDC MSMT LEADCHNL RV PACING THRESHOLD AMPLITUDE: 0.625 V
MDC IDC SESS DTM: 20161025105250
MDC IDC SET LEADCHNL RV PACING AMPLITUDE: 2 V
MDC IDC SET LEADCHNL RV SENSING SENSITIVITY: 1.4 mV
MDC IDC STAT BRADY RV PERCENT PACED: 51 %

## 2015-03-29 NOTE — Patient Instructions (Addendum)
Medication Instructions:  Your physician recommends that you continue on your current medications as directed. Please refer to the Current Medication list given to you today.   Labwork: None ordered   Testing/Procedures: None ordered   Follow-Up: Your physician wants you to follow-up in: 9  months with Chanetta Marshall, NP  You will receive a reminder letter in the mail two months in advance. If you don't receive a letter, please call our office to schedule the follow-up appointment.  Remote monitoring is used to monitor your Pacemaker  from home. This monitoring reduces the number of office visits required to check your device to one time per year. It allows [place] to keep an eye on the functioning of your device to ensure it is working properly. You are scheduled for a device check from home on 06/28/15. You may send your transmission at any time that day. If you have a wireless device, the transmission will be sent automatically. After your physician reviews your transmission, you will receive a postcard with your next transmission date.    Any Other Special Instructions Will Be Listed Below (If Applicable).     If you need a refill on your cardiac medications before your next appointment, please call your pharmacy.

## 2015-03-29 NOTE — Progress Notes (Signed)
HPI [name] returns today for followup. He is a pleasant 80 yo man with a h/o symptomatic bradycardia s/p PPM insertion. He has chronic atrial fibrillation and has done well.  No syncope or chest pain. He remains active though not as much as previously. No edema He denies palpitations, but does admit to some dietary indiscretion. No peripheral edema. No Known Allergies   Current Outpatient Prescriptions  Medication Sig Dispense Refill  . nitroGLYCERIN (NITROSTAT) 0.4 MG SL tablet Place 0.4 mg under the tongue every 5 (five) minutes x 3 doses as needed for chest pain.    Marland Kitchen aspirin EC 81 MG tablet Take 81 mg by mouth daily.    Marland Kitchen atorvastatin (LIPITOR) 20 MG tablet Take 1 tablet by mouth  every day 90 tablet 3  . diltiazem (CARDIZEM LA) 240 MG 24 hr tablet Take 1 tablet (240 mg  total) by mouth daily. 90 tablet 3  . fluticasone (FLONASE) 50 MCG/ACT nasal spray Place 2 sprays into both nostrils daily. 48 g 3  . metoprolol succinate (TOPROL-XL) 50 MG 24 hr tablet One and one half tablets once daily 135 tablet 3  . Multiple Vitamins-Minerals (MULTIVITAMIN & MINERAL PO) Take 1 tablet by mouth daily.    Marland Kitchen omeprazole (PRILOSEC) 20 MG capsule Take 1 capsule (20 mg total) by mouth daily. (Patient not taking: Reported on 12/15/2014) 90 capsule 3  . tamsulosin (FLOMAX) 0.4 MG CAPS capsule Take 1 capsule (0.4 mg  total) by mouth daily. 90 capsule 0  . warfarin (COUMADIN) 2.5 MG tablet Take as directed by the Coumadin Clinic (Patient taking differently: Take 2.5-5 mg by mouth daily. 2.5 mg daily except 5 mg on Mon, Wed, Fri) 135 tablet 3   No current facility-administered medications for this visit.     Past Medical History  Diagnosis Date  . Actinic keratosis   . ALLERGIC RHINITIS   . Permanent atrial fibrillation ([place])   . CAD, NATIVE VESSEL     a. DES x2 to RCA 2004 b. repeat PTCA to RCA 2008 c. PCI to RCA 2012 non-DES   . Cellulitis and abscess of trunk   . HYPERLIPIDEMIA   .  HYPERTENSION   . OTH MALIG NEOPLASM SKIN OTH\T\UNSPEC PARTS FACE   . GERD (gastroesophageal reflux disease)   . Tachy-brady syndrome ([place])     a. s/p MDT dual chamber pacemaker implant complicated by pericardial effusion and micro-perforatation s/p lead revisions    ROS:   All systems reviewed and negative except as noted in the HPI.   Past Surgical History  Procedure Laterality Date  . Cystectomy      from right side of face.  . Cataract extraction w/ intraocular lens  implant, bilateral  2012  . Back surgery  2000    "for bulging discs"  . Pacemaker insertion  2008    MDT dual chamber pacemaker implanted by Dr Percival Spanish  . Coronary angioplasty with stent placement  2008  . Coronary angioplasty with stent placement  05/14/11  . Left heart catheterization with coronary angiogram N/A 05/14/2011    Procedure: LEFT HEART CATHETERIZATION WITH CORONARY ANGIOGRAM;  Surgeon: Hillary Bow, MD;  Location: [facility] CATH LAB;  Service: Cardiovascular;  Laterality: N/A;  . Lead revision  2008    11 days post initial implant, RA and RV lead revisions for micro-perforation and pericardial effusion by Dr Lovena Le  . Ep implantable device N/A 12/16/2014    Procedure: PPM Generator Changeout;  Surgeon: Carleene Overlie  Peyton Najjar, MD;  Location: [place] CV LAB;  Service: Cardiovascular;  Laterality: N/A;     Family History  Problem Relation Age of Onset  . Colon cancer Other   . Cancer Mother   . Heart attack Father      Social History   Social History  . Marital Status: Married    Spouse Name: N/A  . Number of Children: N/A  . Years of Education: N/A   Occupational History  . Not on file.   Social History Main Topics  . Smoking status: Never Smoker   . Smokeless tobacco: Never Used  . Alcohol Use: Yes     Comment: 05/14/11 "last occasional drink was in 1990's"  . Drug Use: No  . Sexual Activity: Yes   Other Topics Concern  . Not on file   Social History Narrative   Widowed.     BP  138/68 mmHg  Pulse 83  Ht 5\' 8"  (1.727 m)  Wt 202 lb 9.6 oz (91.899 kg)  BMI 30.81 kg/m2  Physical Exam:  Well appearing NAD HEENT: Unremarkable Neck:  No JVD, no thyromegally Lymphatics:  No adenopathy Back:  No CVA tenderness Lungs:  Clear with no wheezes, rales, or rhonchi. HEART:  IRegular rate rhythm, no murmurs, no rubs, no clicks Abd:  soft, positive bowel sounds, no organomegally, no rebound, no guarding Ext:  2 plus pulses, no edema, no cyanosis, no clubbing Skin:  No rashes no nodules Neuro:  CN II through XII intact, motor grossly intact  ECG - atrial fib with a cvr  DEVICE  Normal device function.  See PaceArt for details.   Assess/Plan:

## 2015-03-29 NOTE — Assessment & Plan Note (Signed)
He has permanent atrial fib. His ventricular rate is controlled. No change in meds.

## 2015-03-29 NOTE — Assessment & Plan Note (Signed)
His medtronic device is working normally. Will recheck in several months.  

## 2015-03-29 NOTE — Assessment & Plan Note (Signed)
His blood pressure is controlled. He will continue his current meds. I asked him to reduce his salt intake.

## 2015-03-30 ENCOUNTER — Ambulatory Visit (INDEPENDENT_AMBULATORY_CARE_PROVIDER_SITE_OTHER): Payer: Commercial Managed Care - HMO | Admitting: Cardiology

## 2015-03-30 ENCOUNTER — Encounter: Payer: Self-pay | Admitting: Cardiology

## 2015-03-30 VITALS — BP 112/62 | HR 88 | Ht 67.0 in | Wt 204.3 lb

## 2015-03-30 DIAGNOSIS — I480 Paroxysmal atrial fibrillation: Secondary | ICD-10-CM | POA: Diagnosis not present

## 2015-03-30 DIAGNOSIS — I2510 Atherosclerotic heart disease of native coronary artery without angina pectoris: Secondary | ICD-10-CM | POA: Diagnosis not present

## 2015-03-30 DIAGNOSIS — E785 Hyperlipidemia, unspecified: Secondary | ICD-10-CM

## 2015-03-30 NOTE — Progress Notes (Signed)
HPI The patient presents for yearly followup. Since last saw him he has done OK from a cardiovascular standpoint.  He's not having any cardiac symptoms. He denies any chest pressure, neck or arm discomfort. He has had no palpitations, presyncope or syncope. He's has done well on warfarin. He denies any significant weight gain or weight loss. He has no PND or orthopnea. He doesn't exercise but he does his activities of daily living like yard work.  He got up 11 loads of acorns from one tree.    No Known Allergies  Current Outpatient Prescriptions  Medication Sig Dispense Refill  . aspirin EC 81 MG tablet Take 81 mg by mouth daily.    Marland Kitchen atorvastatin (LIPITOR) 20 MG tablet Take 1 tablet by mouth  every day 90 tablet 3  . diltiazem (CARDIZEM LA) 240 MG 24 hr tablet Take 1 tablet (240 mg  total) by mouth daily. 90 tablet 3  . fluticasone (FLONASE) 50 MCG/ACT nasal spray Place 2 sprays into both nostrils daily. 48 g 3  . metoprolol succinate (TOPROL-XL) 50 MG 24 hr tablet One and one half tablets once daily 135 tablet 3  . Multiple Vitamins-Minerals (MULTIVITAMIN & MINERAL PO) Take 1 tablet by mouth daily.    . nitroGLYCERIN (NITROSTAT) 0.4 MG SL tablet Place 0.4 mg under the tongue every 5 (five) minutes x 3 doses as needed for chest pain.    . tamsulosin (FLOMAX) 0.4 MG CAPS capsule Take 1 capsule (0.4 mg  total) by mouth daily. 90 capsule 0  . warfarin (COUMADIN) 2.5 MG tablet Take as directed by the [facility] (Patient taking differently: Take 2.5-5 mg by mouth daily. 2.5 mg daily except 5 mg on Mon, Wed, Fri) 135 tablet 3  . omeprazole (PRILOSEC) 20 MG capsule Take 1 capsule (20 mg total) by mouth daily. (Patient not taking: Reported on 12/15/2014) 90 capsule 3   No current facility-administered medications for this visit.    Past Medical History  Diagnosis Date  . Actinic keratosis   . ALLERGIC RHINITIS   . Permanent atrial fibrillation ([place])   . CAD, NATIVE VESSEL     a. DES x2  to RCA 2004 b. repeat PTCA to RCA 2008 c. PCI to RCA 2012 non-DES   . Cellulitis and abscess of trunk   . HYPERLIPIDEMIA   . HYPERTENSION   . OTH MALIG NEOPLASM SKIN OTH\T\UNSPEC PARTS FACE   . GERD (gastroesophageal reflux disease)   . Tachy-brady syndrome ([place])     a. s/p MDT dual chamber pacemaker implant complicated by pericardial effusion and micro-perforatation s/p lead revisions    Past Surgical History  Procedure Laterality Date  . Cystectomy      from right side of face.  . Cataract extraction w/ intraocular lens  implant, bilateral  2012  . Back surgery  2000    "for bulging discs"  . Pacemaker insertion  2008    MDT dual chamber pacemaker implanted by Dr Percival Spanish  . Coronary angioplasty with stent placement  2008  . Coronary angioplasty with stent placement  05/14/11  . Left heart catheterization with coronary angiogram N/A 05/14/2011    Procedure: LEFT HEART CATHETERIZATION WITH CORONARY ANGIOGRAM;  Surgeon: Hillary Bow, MD;  Location[facility] C[facility] LAB;  Service: Cardiovascular;  Laterality: N/A;  . Lead revision  2008    11 days post initial implant, RA and RV lead revisions for micro-perforation and pericardial effusion by Dr Lovena Le  . Ep implantable device N/A  12/16/2014    Procedure: PPM Generator Changeout;  Surgeon: Evans Lance, MD;  Location: [place] CV LAB;  Service: Cardiovascular;  Laterality: N/A;    ROS: As stated in the HPI and negative for all other systems.  PHYSICAL EXAM BP 112/62 mmHg  Pulse 88  Ht 5\' 7"  (1.702 m)  Wt 204 lb 4.8 oz (92.67 kg)  BMI 31.99 kg/m2 GENERAL:  Well appearing NECK:  No jugular venous distention, waveform within normal limits, carotid upstroke brisk and symmetric, no bruits, no thyromegaly LUNGS:  Clear to auscultation bilaterally BACK:  No CVA tenderness CHEST:  Pacer scar HEART:  PMI not displaced or sustained,S1 and S2 within normal limits, no S3, no clicks, no rubs, no murmurs, irregular ABD:  Flat, positive  bowel sounds normal in frequency in pitch, no bruits, no rebound, no guarding, no midline pulsatile mass, no hepatomegaly, no splenomegaly EXT:  2 plus pulses throughout, mild bilateral lower extremityedema, no cyanosis no clubbing, his left toe is slightly swollen and red   ASSESSMENT AND PLAN  ATRIAL FIBRILLATION -  The patient tolerates this rhythm with rate control and anticoagulation. He is not interested in switching to a novel oral anticoagulant.  He will continue on warfarin.    CAD, NATIVE VESSEL -  He had a stress perfusion study in 2012 demonstrated no high risk findings.  He has had no new symptoms since then. He will continue with risk reduction.  PACEMAKER, PERMANENT -  He has scheduled follow up for this.  No change in therapy is indicated.   DYSLIPIDEMIA - I will check a lipid profile.

## 2015-03-30 NOTE — Patient Instructions (Signed)
Your physician wants you to follow-up in: 1 Year. You will receive a reminder letter in the mail two months in advance. If you don't receive a letter, please call our office to schedule the follow-up appointment.  Your physician recommends that you return for lab work in: Fasting Lipids

## 2015-04-04 ENCOUNTER — Ambulatory Visit (INDEPENDENT_AMBULATORY_CARE_PROVIDER_SITE_OTHER): Payer: Commercial Managed Care - HMO | Admitting: General Practice

## 2015-04-04 DIAGNOSIS — Z5181 Encounter for therapeutic drug level monitoring: Secondary | ICD-10-CM

## 2015-04-04 DIAGNOSIS — E785 Hyperlipidemia, unspecified: Secondary | ICD-10-CM | POA: Diagnosis not present

## 2015-04-04 LAB — LIPID PANEL
CHOL/HDL RATIO: 5
Cholesterol: 200 mg/dL (ref 0–200)
HDL: 37.2 mg/dL — AB (ref >39.00)
LDL CALC: 128 mg/dL — AB (ref 0–99)
NonHDL: 162.71
Triglycerides: 175 mg/dL — ABNORMAL HIGH (ref 0.0–149.0)
VLDL: 35 mg/dL (ref 0.0–40.0)

## 2015-04-04 LAB — POCT INR: INR: 2.6

## 2015-04-04 NOTE — Progress Notes (Signed)
Pre visit review using our clinic review tool, if applicable. No additional management support is needed unless otherwise documented below in the visit note. 

## 2015-04-25 ENCOUNTER — Telehealth: Payer: Self-pay | Admitting: *Deleted

## 2015-04-25 DIAGNOSIS — Z79899 Other long term (current) drug therapy: Secondary | ICD-10-CM

## 2015-04-25 MED ORDER — ATORVASTATIN CALCIUM 40 MG PO TABS: ORAL_TABLET | ORAL | Status: DC

## 2015-04-25 NOTE — Telephone Encounter (Signed)
-----   Message from Minus Breeding, MD sent at 04/25/2015  9:03 AM EST ----- LDL is elevated.  Should increase Lipitor to 40 mg daily and get lipid and liver profile repeated in 8 weeks.  Call Mr. Pusey with the results and send results to Garret Reddish, MD

## 2015-04-25 NOTE — Telephone Encounter (Signed)
Spoke with pt about blood work, told pt to increase lipitor to 40 mg, prescription for Lipitor 40 mg # 90 3 refills was send into pt pharmacy Fasting lipids liver was order for pt to have blood work done in 8 weeks.

## 2015-05-05 ENCOUNTER — Ambulatory Visit (INDEPENDENT_AMBULATORY_CARE_PROVIDER_SITE_OTHER): Payer: Commercial Managed Care - HMO | Admitting: General Practice

## 2015-05-05 DIAGNOSIS — Z5181 Encounter for therapeutic drug level monitoring: Secondary | ICD-10-CM | POA: Diagnosis not present

## 2015-05-05 LAB — POCT INR: INR: 3.2

## 2015-05-05 NOTE — Progress Notes (Signed)
Pre visit review using our clinic review tool, if applicable. No additional management support is needed unless otherwise documented below in the visit note. 

## 2015-05-11 DIAGNOSIS — L570 Actinic keratosis: Secondary | ICD-10-CM | POA: Diagnosis not present

## 2015-05-11 DIAGNOSIS — L812 Freckles: Secondary | ICD-10-CM | POA: Diagnosis not present

## 2015-05-11 DIAGNOSIS — D225 Melanocytic nevi of trunk: Secondary | ICD-10-CM | POA: Diagnosis not present

## 2015-05-11 DIAGNOSIS — I872 Venous insufficiency (chronic) (peripheral): Secondary | ICD-10-CM | POA: Diagnosis not present

## 2015-05-11 DIAGNOSIS — Z85828 Personal history of other malignant neoplasm of skin: Secondary | ICD-10-CM | POA: Diagnosis not present

## 2015-05-11 DIAGNOSIS — L821 Other seborrheic keratosis: Secondary | ICD-10-CM | POA: Diagnosis not present

## 2015-06-02 ENCOUNTER — Ambulatory Visit (INDEPENDENT_AMBULATORY_CARE_PROVIDER_SITE_OTHER): Payer: Commercial Managed Care - HMO | Admitting: General Practice

## 2015-06-02 DIAGNOSIS — Z5181 Encounter for therapeutic drug level monitoring: Secondary | ICD-10-CM

## 2015-06-02 LAB — POCT INR: INR: 2.5

## 2015-06-02 NOTE — Progress Notes (Signed)
Pre visit review using our clinic review tool, if applicable. No additional management support is needed unless otherwise documented below in the visit note. 

## 2015-06-28 ENCOUNTER — Ambulatory Visit (INDEPENDENT_AMBULATORY_CARE_PROVIDER_SITE_OTHER): Payer: Commercial Managed Care - HMO | Admitting: *Deleted

## 2015-06-28 DIAGNOSIS — I495 Sick sinus syndrome: Secondary | ICD-10-CM

## 2015-06-29 ENCOUNTER — Ambulatory Visit (INDEPENDENT_AMBULATORY_CARE_PROVIDER_SITE_OTHER): Payer: Commercial Managed Care - HMO | Admitting: General Practice

## 2015-06-29 DIAGNOSIS — I4891 Unspecified atrial fibrillation: Secondary | ICD-10-CM

## 2015-06-29 DIAGNOSIS — Z5181 Encounter for therapeutic drug level monitoring: Secondary | ICD-10-CM | POA: Diagnosis not present

## 2015-06-29 LAB — POCT INR: INR: 2.9

## 2015-06-29 NOTE — Progress Notes (Signed)
Pre visit review using our clinic review tool, if applicable. No additional management support is needed unless otherwise documented below in the visit note. 

## 2015-06-29 NOTE — Progress Notes (Signed)
Remote pacemaker transmission.   

## 2015-07-12 LAB — CUP PACEART REMOTE DEVICE CHECK
Battery Remaining Longevity: 122 mo
Battery Voltage: 2.79 V
Implantable Lead Implant Date: 20081007
Implantable Lead Location: 753859
Implantable Lead Location: 753860
Implantable Lead Model: 5076
Lead Channel Pacing Threshold Amplitude: 0.75 V
Lead Channel Setting Pacing Amplitude: 2 V
Lead Channel Setting Sensing Sensitivity: 1.4 mV
MDC IDC LEAD IMPLANT DT: 20081007
MDC IDC MSMT BATTERY IMPEDANCE: 131 Ohm
MDC IDC MSMT LEADCHNL RA IMPEDANCE VALUE: 0 Ohm
MDC IDC MSMT LEADCHNL RV IMPEDANCE VALUE: 526 Ohm
MDC IDC MSMT LEADCHNL RV PACING THRESHOLD PULSEWIDTH: 0.4 ms
MDC IDC SESS DTM: 20170124130315
MDC IDC SET LEADCHNL RV PACING PULSEWIDTH: 0.4 ms
MDC IDC STAT BRADY RV PERCENT PACED: 52 %

## 2015-07-15 ENCOUNTER — Encounter: Payer: Self-pay | Admitting: Cardiology

## 2015-07-25 ENCOUNTER — Ambulatory Visit (INDEPENDENT_AMBULATORY_CARE_PROVIDER_SITE_OTHER): Payer: Commercial Managed Care - HMO | Admitting: General Practice

## 2015-07-25 DIAGNOSIS — Z5181 Encounter for therapeutic drug level monitoring: Secondary | ICD-10-CM | POA: Diagnosis not present

## 2015-07-25 DIAGNOSIS — I4891 Unspecified atrial fibrillation: Secondary | ICD-10-CM | POA: Diagnosis not present

## 2015-07-25 LAB — POCT INR: INR: 2.5

## 2015-07-25 NOTE — Progress Notes (Signed)
Pre visit review using our clinic review tool, if applicable. No additional management support is needed unless otherwise documented below in the visit note. 

## 2015-08-02 ENCOUNTER — Other Ambulatory Visit: Payer: Self-pay | Admitting: Family Medicine

## 2015-09-05 ENCOUNTER — Ambulatory Visit (INDEPENDENT_AMBULATORY_CARE_PROVIDER_SITE_OTHER): Payer: Commercial Managed Care - HMO | Admitting: General Practice

## 2015-09-05 DIAGNOSIS — I4891 Unspecified atrial fibrillation: Secondary | ICD-10-CM | POA: Diagnosis not present

## 2015-09-05 DIAGNOSIS — Z5181 Encounter for therapeutic drug level monitoring: Secondary | ICD-10-CM

## 2015-09-05 LAB — POCT INR: INR: 2.9

## 2015-09-05 NOTE — Progress Notes (Signed)
Pre visit review using our clinic review tool, if applicable. No additional management support is needed unless otherwise documented below in the visit note. 

## 2015-09-27 ENCOUNTER — Ambulatory Visit (INDEPENDENT_AMBULATORY_CARE_PROVIDER_SITE_OTHER): Payer: Commercial Managed Care - HMO | Admitting: *Deleted

## 2015-09-27 DIAGNOSIS — I495 Sick sinus syndrome: Secondary | ICD-10-CM

## 2015-09-27 NOTE — Progress Notes (Signed)
Remote pacemaker transmission.   

## 2015-10-17 ENCOUNTER — Telehealth: Payer: Self-pay | Admitting: Family Medicine

## 2015-10-17 ENCOUNTER — Ambulatory Visit (INDEPENDENT_AMBULATORY_CARE_PROVIDER_SITE_OTHER): Payer: Commercial Managed Care - HMO | Admitting: General Practice

## 2015-10-17 ENCOUNTER — Other Ambulatory Visit: Payer: Self-pay | Admitting: General Practice

## 2015-10-17 DIAGNOSIS — Z5181 Encounter for therapeutic drug level monitoring: Secondary | ICD-10-CM | POA: Diagnosis not present

## 2015-10-17 DIAGNOSIS — I4891 Unspecified atrial fibrillation: Secondary | ICD-10-CM

## 2015-10-17 DIAGNOSIS — Z1211 Encounter for screening for malignant neoplasm of colon: Secondary | ICD-10-CM

## 2015-10-17 LAB — POCT INR: INR: 2.7

## 2015-10-17 MED ORDER — METOPROLOL SUCCINATE ER 50 MG PO TB24: ORAL_TABLET | ORAL | Status: DC

## 2015-10-17 NOTE — Telephone Encounter (Signed)
Patient saw provider in October.  He asked at that time to put his colonoscopy off a little while and he is now ready to get one done.  He wants to know if he needs to come back in to the dr or if it can be scheduled without coming in.

## 2015-10-17 NOTE — Telephone Encounter (Signed)
Called patient and informed him that he was referred for colonoscopy and they will call him with appointment time once it is set. AH

## 2015-10-17 NOTE — Progress Notes (Signed)
Pre visit review using our clinic review tool, if applicable. No additional management support is needed unless otherwise documented below in the visit note. 

## 2015-10-18 ENCOUNTER — Encounter: Payer: Self-pay | Admitting: Gastroenterology

## 2015-11-03 DIAGNOSIS — L821 Other seborrheic keratosis: Secondary | ICD-10-CM | POA: Diagnosis not present

## 2015-11-03 DIAGNOSIS — D044 Carcinoma in situ of skin of scalp and neck: Secondary | ICD-10-CM | POA: Diagnosis not present

## 2015-11-03 DIAGNOSIS — D485 Neoplasm of uncertain behavior of skin: Secondary | ICD-10-CM | POA: Diagnosis not present

## 2015-11-03 DIAGNOSIS — C44319 Basal cell carcinoma of skin of other parts of face: Secondary | ICD-10-CM | POA: Diagnosis not present

## 2015-11-03 DIAGNOSIS — C4442 Squamous cell carcinoma of skin of scalp and neck: Secondary | ICD-10-CM | POA: Diagnosis not present

## 2015-11-03 DIAGNOSIS — L812 Freckles: Secondary | ICD-10-CM | POA: Diagnosis not present

## 2015-11-03 DIAGNOSIS — D225 Melanocytic nevi of trunk: Secondary | ICD-10-CM | POA: Diagnosis not present

## 2015-11-03 DIAGNOSIS — L570 Actinic keratosis: Secondary | ICD-10-CM | POA: Diagnosis not present

## 2015-11-03 LAB — CUP PACEART REMOTE DEVICE CHECK
Battery Impedance: 178 Ohm
Battery Remaining Longevity: 114 mo
Battery Voltage: 2.79 V
Date Time Interrogation Session: 20170425123319
Implantable Lead Implant Date: 20081007
Implantable Lead Location: 753859
Lead Channel Setting Pacing Amplitude: 2 V
Lead Channel Setting Sensing Sensitivity: 1.4 mV
MDC IDC LEAD IMPLANT DT: 20081007
MDC IDC LEAD LOCATION: 753860
MDC IDC MSMT LEADCHNL RA IMPEDANCE VALUE: 0 Ohm
MDC IDC MSMT LEADCHNL RV IMPEDANCE VALUE: 534 Ohm
MDC IDC MSMT LEADCHNL RV PACING THRESHOLD AMPLITUDE: 0.75 V
MDC IDC MSMT LEADCHNL RV PACING THRESHOLD PULSEWIDTH: 0.4 ms
MDC IDC SET LEADCHNL RV PACING PULSEWIDTH: 0.4 ms
MDC IDC STAT BRADY RV PERCENT PACED: 56 %

## 2015-11-04 ENCOUNTER — Encounter: Payer: Self-pay | Admitting: Cardiology

## 2015-11-21 ENCOUNTER — Ambulatory Visit (INDEPENDENT_AMBULATORY_CARE_PROVIDER_SITE_OTHER): Payer: Commercial Managed Care - HMO | Admitting: Family Medicine

## 2015-11-21 ENCOUNTER — Ambulatory Visit (INDEPENDENT_AMBULATORY_CARE_PROVIDER_SITE_OTHER): Payer: Commercial Managed Care - HMO | Admitting: General Practice

## 2015-11-21 ENCOUNTER — Encounter: Payer: Self-pay | Admitting: Family Medicine

## 2015-11-21 VITALS — BP 139/62 | HR 71 | Temp 98.4°F | Ht 65.5 in | Wt 201.0 lb

## 2015-11-21 DIAGNOSIS — I4891 Unspecified atrial fibrillation: Secondary | ICD-10-CM

## 2015-11-21 DIAGNOSIS — Z5181 Encounter for therapeutic drug level monitoring: Secondary | ICD-10-CM | POA: Diagnosis not present

## 2015-11-21 DIAGNOSIS — E785 Hyperlipidemia, unspecified: Secondary | ICD-10-CM | POA: Diagnosis not present

## 2015-11-21 DIAGNOSIS — Z0000 Encounter for general adult medical examination without abnormal findings: Secondary | ICD-10-CM | POA: Diagnosis not present

## 2015-11-21 LAB — COMPREHENSIVE METABOLIC PANEL
ALT: 24 U/L (ref 0–53)
AST: 16 U/L (ref 0–37)
Albumin: 4.1 g/dL (ref 3.5–5.2)
Alkaline Phosphatase: 67 U/L (ref 39–117)
BUN: 16 mg/dL (ref 6–23)
CALCIUM: 9.2 mg/dL (ref 8.4–10.5)
CHLORIDE: 107 meq/L (ref 96–112)
CO2: 23 meq/L (ref 19–32)
CREATININE: 1.11 mg/dL (ref 0.40–1.50)
GFR: 67.95 mL/min (ref >60.00)
Glucose, Bld: 117 mg/dL — ABNORMAL HIGH (ref 70–99)
Potassium: 4.3 mEq/L (ref 3.5–5.1)
SODIUM: 142 meq/L (ref 135–145)
Total Bilirubin: 0.9 mg/dL (ref 0.2–1.2)
Total Protein: 7 g/dL (ref 6.0–8.3)

## 2015-11-21 LAB — POCT INR: INR: 2.5

## 2015-11-21 LAB — CBC
HCT: 46.1 % (ref 39.0–52.0)
Hemoglobin: 15.6 g/dL (ref 13.0–17.0)
MCHC: 33.8 g/dL (ref 30.0–36.0)
MCV: 91.2 fl (ref 78.0–100.0)
Platelets: 173 10*3/uL (ref 150.0–400.0)
RBC: 5.05 Mil/uL (ref 4.22–5.81)
RDW: 13.8 % (ref 11.5–15.5)
WBC: 8.7 10*3/uL (ref 4.0–10.5)

## 2015-11-21 LAB — LDL CHOLESTEROL, DIRECT: Direct LDL: 54 mg/dL

## 2015-11-21 MED ORDER — FLUTICASONE PROPIONATE 50 MCG/ACT NA SUSP: 2.0000 | Freq: Every day | NASAL | Status: DC

## 2015-11-21 NOTE — Patient Instructions (Signed)
Labs before you leave  No changes in medicine
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
## 2015-11-21 NOTE — Progress Notes (Signed)
Pre visit review using our clinic review tool, if applicable. No additional management support is needed unless otherwise documented below in the visit note. 

## 2015-11-22 ENCOUNTER — Telehealth: Payer: Self-pay

## 2015-11-22 NOTE — Telephone Encounter (Signed)
-----   Message from Marin Olp, MD sent at 11/21/2015 12:31 PM EDT ----- Bad cholesterol looks much better on the higher dose cholesterol medicine- continue the atorvastatin 40mg .   At risk for diabetes with sugar of 117. Regular exercise and healthy eating can help with prevention  Rest of labs normal

## 2015-11-22 NOTE — Telephone Encounter (Signed)
Called patient. Gave lab results. Patient verbalized understanding.  

## 2015-12-07 ENCOUNTER — Ambulatory Visit (INDEPENDENT_AMBULATORY_CARE_PROVIDER_SITE_OTHER): Payer: Commercial Managed Care - HMO | Admitting: General Practice

## 2015-12-07 DIAGNOSIS — I4891 Unspecified atrial fibrillation: Secondary | ICD-10-CM

## 2015-12-07 DIAGNOSIS — Z5181 Encounter for therapeutic drug level monitoring: Secondary | ICD-10-CM

## 2015-12-07 LAB — POCT INR: INR: 2.1

## 2015-12-07 NOTE — Progress Notes (Signed)
Pre visit review using our clinic review tool, if applicable. No additional management support is needed unless otherwise documented below in the visit note. 

## 2015-12-13 DIAGNOSIS — C4442 Squamous cell carcinoma of skin of scalp and neck: Secondary | ICD-10-CM | POA: Diagnosis not present

## 2015-12-13 DIAGNOSIS — C44319 Basal cell carcinoma of skin of other parts of face: Secondary | ICD-10-CM | POA: Diagnosis not present

## 2015-12-26 ENCOUNTER — Encounter: Payer: Commercial Managed Care - HMO | Admitting: Nurse Practitioner

## 2015-12-26 DIAGNOSIS — D044 Carcinoma in situ of skin of scalp and neck: Secondary | ICD-10-CM | POA: Diagnosis not present

## 2015-12-27 ENCOUNTER — Ambulatory Visit (INDEPENDENT_AMBULATORY_CARE_PROVIDER_SITE_OTHER): Payer: Commercial Managed Care - HMO | Admitting: Gastroenterology

## 2015-12-27 ENCOUNTER — Telehealth: Payer: Self-pay

## 2015-12-27 ENCOUNTER — Encounter: Payer: Self-pay | Admitting: Gastroenterology

## 2015-12-27 VITALS — BP 118/60 | HR 76 | Ht 65.5 in | Wt 199.1 lb

## 2015-12-27 DIAGNOSIS — Z86010 Personal history of colonic polyps: Secondary | ICD-10-CM

## 2015-12-27 DIAGNOSIS — Z800 Family history of malignant neoplasm of digestive organs: Secondary | ICD-10-CM

## 2015-12-27 MED ORDER — NA SULFATE-K SULFATE-MG SULF 17.5-3.13-1.6 GM/177ML PO SOLN: 1.0000 | Freq: Once | ORAL | 0 refills | Status: AC

## 2015-12-27 NOTE — Patient Instructions (Addendum)
You will be set up for a colonoscopy for FH of colon cancer, also history of polyps  We will contact your cardiologist about holding your coumadin for 5 days prior to the colonoscopy.  You will be contaced by our office prior to your procedure for directions on holding your Coumadin/Warfarin.  If you do not hear from our office 1 week prior to your scheduled procedure, please call [phone] to discuss.

## 2015-12-27 NOTE — Telephone Encounter (Signed)
RE: SALAAM CERESA DOB: January 21, 1935 MRN: [id number]   Dear Dr Percival Spanish,    We have scheduled the above patient for an endoscopic procedure. Our records show that he is on anticoagulation therapy.   Please advise as to how long the patient may come off his therapy of coumadin prior to the procedure, which is scheduled for 02/17/16.  Please fax back/ or route the completed form to Chaitanya Amedee at [phone].   Sincerely,    Christian Mate RN

## 2015-12-27 NOTE — Progress Notes (Signed)
HPI: This is a   very pleasant 81 year old man  who was referred to me by Marin Olp, MD  to evaluate  personal history of precancerous colon polyps, family history of colon cancer, current atrial fibrillation with blood thinner use chronically .     Colonoscopy about 10 years ago. One polyp it was adenomatous, subCM.  He was recommended to have repeat colonoscopy at five-year interval.   Looking back at her records I do see that he was sent a recall letter in 2012 and also in 2013.  Dad was diagnoed with colon cancer in his 4s  On coumadin for afib. He has been on this for the past 15 years or so.  Review of systems: Pertinent positive and negative review of systems were noted in the above HPI section. Complete review of systems was performed and was otherwise normal.   Past Medical History:  Diagnosis Date  . Actinic keratosis   . Adenomatous colon polyp   . ALLERGIC RHINITIS   . Arthritis   . CAD, NATIVE VESSEL    a. DES x2 to RCA 2004 b. repeat PTCA to RCA 2008 c. PCI to RCA 2012 non-DES   . Cellulitis and abscess of trunk   . Diverticulosis   . GERD (gastroesophageal reflux disease)   . HYPERLIPIDEMIA   . HYPERTENSION   . OTH MALIG NEOPLASM SKIN OTH\T\UNSPEC PARTS FACE   . Permanent atrial fibrillation ([place])   . Skin cancer   . Tachy-brady syndrome ([place])    a. s/p MDT dual chamber pacemaker implant complicated by pericardial effusion and micro-perforatation s/p lead revisions    Past Surgical History:  Procedure Laterality Date  . BACK SURGERY  2000   "for bulging discs"  . CATARACT EXTRACTION W/ INTRAOCULAR LENS  IMPLANT, BILATERAL  2012  . CORONARY ANGIOPLASTY WITH STENT PLACEMENT  2008  . CORONARY ANGIOPLASTY WITH STENT PLACEMENT  05/14/11  . CYSTECTOMY     from right side of face.  . EP IMPLANTABLE DEVICE N/A 12/16/2014   Procedure: PPM Generator Changeout;  Surgeon: Evans Lance, MD;  Location: [facility] CV LAB;  Service: Cardiovascular;  Laterality:  N/A;  . LEAD REVISION  2008   11 days post initial implant, RA and RV lead revisions for micro-perforation and pericardial effusion by Dr Lovena Le  . LEFT HEART CATHETERIZATION WITH CORONARY ANGIOGRAM N/A 05/14/2011   Procedure: LEFT HEART CATHETERIZATION WITH CORONARY ANGIOGRAM;  Surgeon: Hillary Bow, MD;  Location: [facility] CATH LAB;  Service: Cardiovascular;  Laterality: N/A;  . PACEMAKER INSERTION  2008   MDT dual chamber pacemaker implanted by Dr Percival Spanish    Current Outpatient Prescriptions  Medication Sig Dispense Refill  . aspirin EC 81 MG tablet Take 81 mg by mouth daily.    Marland Kitchen atorvastatin (LIPITOR) 40 MG tablet Take 1 tablet by mouth  every day 90 tablet 3  . CARTIA XT 240 MG 24 hr capsule TAKE 1 CAPSULE EVERY DAY 90 capsule 3  . fluticasone (FLONASE) 50 MCG/ACT nasal spray Place 2 sprays into both nostrils daily. 48 g 3  . metoprolol succinate (TOPROL-XL) 50 MG 24 hr tablet One and one half tablets once daily 135 tablet 1  . Multiple Vitamins-Minerals (MULTIVITAMIN & MINERAL PO) Take 1 tablet by mouth daily.    . nitroGLYCERIN (NITROSTAT) 0.4 MG SL tablet Place 0.4 mg under the tongue every 5 (five) minutes x 3 doses as needed for chest pain.    Marland Kitchen omeprazole (PRILOSEC) 20 MG capsule  Take 1 capsule (20 mg total) by mouth daily. 90 capsule 3  . tamsulosin (FLOMAX) 0.4 MG CAPS capsule TAKE 1 CAPSULE EVERY DAY 90 capsule 2  . warfarin (COUMADIN) 2.5 MG tablet TAKE AS DIRECTED BY THE COUMADIN CLINIC 135 tablet 3   No current facility-administered medications for this visit.     Allergies as of 12/27/2015  . (No Known Allergies)    Family History  Problem Relation Age of Onset  . Ovarian cancer Mother   . Heart attack Father   . Colon cancer Father 32    deceased 27  . Diabetes Brother     x 2  . Heart disease Brother     x 3    Social History   Social History  . Marital status: Married    Spouse name: N/A  . Number of children: 2  . Years of education: N/A    Occupational History  . retired    Social History Main Topics  . Smoking status: Never Smoker  . Smokeless tobacco: Never Used  . Alcohol use No     Comment: 05/14/11 "last occasional drink was in 1990's"  . Drug use: No  . Sexual activity: Yes   Other Topics Concern  . Not on file   Social History Narrative   Widowed March 2016. 2 sons. 4 grandkids- all through college. 1 greatgrandchild on way 11/2015.    Lives by himself.       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     Physical Exam: BP 118/60 (BP Location: Left Arm, Patient Position: Sitting, Cuff Size: Normal)   Pulse 76   Ht 5' 5.5" (1.664 m) Comment: height measured without shoes  Wt 199 lb 2 oz (90.3 kg)   BMI 32.63 kg/m  Constitutional: generally well-appearing Psychiatric: alert and oriented x3 Eyes: extraocular movements intact Mouth: oral pharynx moist, no lesions Neck: supple no lymphadenopathy Cardiovascular: heart regular rate and rhythm Lungs: clear to auscultation bilaterally Abdomen: soft, nontender, nondistended, no obvious ascites, no peritoneal signs, normal bowel sounds Extremities: no lower extremity edema bilaterally Skin: no lesions on visible extremities   Assessment and plan: 81 y.o. male with  Personal history of adenomatous polyps, family history of colon cancer, ongoing blood thinner use for atrial fibrillation  I recommended that we proceed with screening, surveillance colonoscopy at his soonest convenience. He understands that being on a blood thinner would put him at increased risk for bleeding complications during the colonoscopy since I recommended that we hold his Coumadin for 5 days prior to the examination. We will contact his cardiologist to make sure that they agree that this is a safe recommendation. I see no reason for any further blood tests or  imaging studies prior to then.   Owens Loffler, MD [facility] 12/27/2015, 10:10 AM  Cc: Marin Olp, MD

## 2015-12-29 NOTE — Telephone Encounter (Signed)
He can hold the warfarin for five days prior to the procedure.

## 2015-12-30 NOTE — Telephone Encounter (Signed)
Clearance faxed to Christian Mate via [employer]

## 2016-01-02 ENCOUNTER — Ambulatory Visit: Payer: Commercial Managed Care - HMO

## 2016-01-02 DIAGNOSIS — S0100XA Unspecified open wound of scalp, initial encounter: Secondary | ICD-10-CM | POA: Diagnosis not present

## 2016-01-02 NOTE — Telephone Encounter (Signed)
Left message on machine to call back  

## 2016-01-02 NOTE — Telephone Encounter (Signed)
Pt has been notified of the anti coag instructions, he verbalized understanding

## 2016-01-05 ENCOUNTER — Encounter: Payer: Self-pay | Admitting: Physician Assistant

## 2016-01-05 ENCOUNTER — Ambulatory Visit (INDEPENDENT_AMBULATORY_CARE_PROVIDER_SITE_OTHER): Payer: Commercial Managed Care - HMO | Admitting: Physician Assistant

## 2016-01-05 VITALS — BP 120/64 | HR 84 | Ht 65.5 in | Wt 198.0 lb

## 2016-01-05 DIAGNOSIS — I2510 Atherosclerotic heart disease of native coronary artery without angina pectoris: Secondary | ICD-10-CM

## 2016-01-05 DIAGNOSIS — I10 Essential (primary) hypertension: Secondary | ICD-10-CM

## 2016-01-05 DIAGNOSIS — I495 Sick sinus syndrome: Secondary | ICD-10-CM

## 2016-01-05 DIAGNOSIS — I4891 Unspecified atrial fibrillation: Secondary | ICD-10-CM | POA: Diagnosis not present

## 2016-01-05 NOTE — Patient Instructions (Signed)
Medication Instructions:   Your physician recommends that you continue on your current medications as directed. Please refer to the Current Medication list given to you today.  If you need a refill on your cardiac medications before your next appointment, please call your pharmacy.  Labwork: NONE ORDER TODAY    Testing/Procedures: NONE ORDER TODAY    Follow-Up:  Your physician wants you to follow-up in: [place] will receive a reminder letter in the mail two months in advance. If you don't receive a letter, please call our office to schedule the follow-up appointment.   Remote monitoring is used to monitor your Pacemaker of ICD from home. This monitoring reduces the number of office visits required to check your device to one time per year. It allows [place] to keep an eye on the functioning of your device to ensure it is working properly. You are scheduled for a device check from home on . 04/06/2016..You may send your transmission at any time that day. If you have a wireless device, the transmission will be sent automatically. After your physician reviews your transmission, you will receive a postcard with your next transmission date.    Any Other Special Instructions Will Be Listed Below (If Applicable).

## 2016-01-05 NOTE — Progress Notes (Signed)
Cardiology Office Note Date:  01/05/2016  Patient ID:  Shawn, [name] [birth year], MRN [id number] PCP:  Garret Reddish, MD  Cardiologist:  Dr. York Ram Electrophysiologist: Dr. Lovena Le   Chief Complaint:  Routine device visit  History of Present Illness: Shawn [name] is a 81 y.o. male with history of symptomatic bradycardia/tachy-brady with PPM, permanent afib, CAD with last intervention 2012, HTN, HLD.  He comes in today to be seen for Dr. Lovena Le, last seen by him Oct 2016 at that time doing well.   He reports doing very well, denies any kind of symptoms, no CP, palpitations or SOB, no dizziness, near syncope or syncope.  He reports being active without exertional intolerances.  Device information: MDT single chamber PPM, gen change 12/06/14 single chamber device, original implant 10/7/08dual chamber, A lead is capped, Dr. Lovena Le, tachy-brady   Past Medical History:  Diagnosis Date  . Actinic keratosis   . Adenomatous colon polyp   . ALLERGIC RHINITIS   . Arthritis   . CAD, NATIVE VESSEL    a. DES x2 to RCA 2004 b. repeat PTCA to RCA 2008 c. PCI to RCA 2012 non-DES   . Cellulitis and abscess of trunk   . Diverticulosis   . GERD (gastroesophageal reflux disease)   . HYPERLIPIDEMIA   . HYPERTENSION   . OTH MALIG NEOPLASM SKIN OTH\T\UNSPEC PARTS FACE   . Permanent atrial fibrillation ([place])   . Skin cancer   . Tachy-brady syndrome ([place])    a. s/p MDT dual chamber pacemaker implant complicated by pericardial effusion and micro-perforatation s/p lead revisions    Past Surgical History:  Procedure Laterality Date  . BACK SURGERY  2000   "for bulging discs"  . CATARACT EXTRACTION W/ INTRAOCULAR LENS  IMPLANT, BILATERAL  2012  . CORONARY ANGIOPLASTY WITH STENT PLACEMENT  2008  . CORONARY ANGIOPLASTY WITH STENT PLACEMENT  05/14/11  . CYSTECTOMY     from right side of face.  . EP IMPLANTABLE DEVICE N/A 12/16/2014   Procedure: PPM Generator Changeout;  Surgeon: Evans Lance, MD;  Location: [facility];  Service: Cardiovascular;  Laterality: N/A;  . LEAD REVISION  2008   11 days post initial implant, RA and RV lead revisions for micro-perforation and pericardial effusion by Dr Lovena Le  . LEFT HEART CATHETERIZATION WITH CORONARY ANGIOGRAM N/A 05/14/2011   Procedure: LEFT HEART CATHETERIZATION WITH CORONARY ANGIOGRAM;  Surgeon: Hillary Bow, MD;  Location: [facility] CATH LAB;  Service: Cardiovascular;  Laterality: N/A;  . PACEMAKER INSERTION  2008   MDT dual chamber pacemaker implanted by Dr Percival Spanish    Current Outpatient Prescriptions  Medication Sig Dispense Refill  . aspirin EC 81 MG tablet Take 81 mg by mouth daily.    Marland Kitchen atorvastatin (LIPITOR) 40 MG tablet Take 1 tablet by mouth  every day 90 tablet 3  . CARTIA XT 240 MG 24 hr capsule TAKE 1 CAPSULE EVERY DAY 90 capsule 3  . fluticasone (FLONASE) 50 MCG/ACT nasal spray Place 2 sprays into both nostrils daily. 48 g 3  . metoprolol succinate (TOPROL-XL) 50 MG 24 hr tablet One and one half tablets once daily 135 tablet 1  . Multiple Vitamins-Minerals (MULTIVITAMIN & MINERAL PO) Take 1 tablet by mouth daily.    . nitroGLYCERIN (NITROSTAT) 0.4 MG SL tablet Place 0.4 mg under the tongue every 5 (five) minutes x 3 doses as needed for chest pain.    Marland Kitchen omeprazole (PRILOSEC) 20 MG capsule Take 1 capsule (20  mg total) by mouth daily. 90 capsule 3  . tamsulosin (FLOMAX) 0.4 MG CAPS capsule TAKE 1 CAPSULE EVERY DAY 90 capsule 2  . warfarin (COUMADIN) 2.5 MG tablet TAKE AS DIRECTED BY THE COUMADIN CLINIC 135 tablet 3   No current facility-administered medications for this visit.     Allergies:   Review of patient's allergies indicates no known allergies.   Social History:  The patient  reports that he has never smoked. He has never used smokeless tobacco. He reports that he does not drink alcohol or use drugs.   Family History:  The patient's family history includes Colon cancer (age of onset: 79) in his  father; Diabetes in his brother; Heart attack in his father; Heart disease in his brother; Ovarian cancer in his mother.  ROS:  Please see the history of present illness.    All other systems are reviewed and otherwise negative.   PHYSICAL EXAM:  VS:  BP 120/64   Pulse 84   Ht 5' 5.5" (1.664 m)   Wt 198 lb (89.8 kg)   BMI 32.45 kg/m  BMI: Body mass index is 32.45 kg/m. Well nourished, well developed, in no acute distress  HEENT: normocephalic, bandage on top of his head, (recently had some skin lesions removed)  Neck: no JVD, carotid bruits or masses Cardiac:  (paced) RRR; no significant murmurs, no rubs, or gallops Lungs:  clear to auscultation bilaterally, no wheezing, rhonchi or rales  Abd: soft, nontender MS: no deformity or atrophy Ext: trace edema, chronic looking skin changes b/l LE  Skin: warm and dry, no rash Neuro:  No gross deficits appreciated Psych: euthymic mood, full affect  PPM site is stable, no tethering or discomfort   EKG:  Done today and reviewed by myself shows V paced PPM interrogation today: normal device function, battery status is good  stress perfusion study in 2012 demonstrated no high risk findings  06/30/07: Echocardiogram SUMMARY - Overall left ventricular systolic function was normal. Left    ventricular ejection fraction was estimated to be 60 %. There    was no diagnostic evidence of left ventricular regional wall    motion abnormalities. - The pulmonary veins were grossly normal. - The right ventricle was mildly dilated. Right ventricular    systolic function was mildly reduced. There was the    appearance of a catheter or pacing wire in the right    ventricle. - There was a trivial pericardial effusion posterior to the heart.  Recent Labs: 11/21/2015: ALT 24; BUN 16; Creatinine, Ser 1.11; Hemoglobin 15.6; Platelets 173.0; Potassium 4.3; Sodium 142  04/04/2015: Cholesterol 200; HDL 37.20; LDL Cholesterol  128; Total CHOL/HDL Ratio 5; Triglycerides 175.0; VLDL 35.0 11/21/2015: Direct LDL 54.0   CrCl cannot be calculated (Patient's most recent lab result is older than the maximum 21 days allowed.).   Wt Readings from Last 3 Encounters:  01/05/16 198 lb (89.8 kg)  12/27/15 199 lb 2 oz (90.3 kg)  11/21/15 201 lb (91.2 kg)     Other studies reviewed: Additional studies/records reviewed today include: summarized above  ASSESSMENT AND PLAN:  1. Tachy-brady sx, PPM     normal device function     q 51mo remotes  2. Permanent AFib     CHA2DS2Vasc is at least 3, on warfarin, managed with the coumadin clinic     11/21/15, H/H 15/46, plts 173   3. HTN     stable  4. CAD     No symptoms  On ASA, BB, statin     Follows with Dr. Percival Spanish  Disposition: F/u with 36mo remote and Dr. Lovena Le in 1 year, he has a scheduled visit with Dr. Percival Spanish as well.  Current medicines are reviewed at length with the patient today.  The patient did not have any concerns regarding medicines.  Haywood Lasso, PA-C 01/05/2016 11:08 AM     [address] [place] [id number] [phone] (office)  [phone] (fax)

## 2016-01-09 ENCOUNTER — Ambulatory Visit (INDEPENDENT_AMBULATORY_CARE_PROVIDER_SITE_OTHER): Payer: Commercial Managed Care - HMO | Admitting: General Practice

## 2016-01-09 DIAGNOSIS — Z5181 Encounter for therapeutic drug level monitoring: Secondary | ICD-10-CM

## 2016-01-09 DIAGNOSIS — I4891 Unspecified atrial fibrillation: Secondary | ICD-10-CM

## 2016-01-09 LAB — POCT INR: INR: 3

## 2016-01-09 NOTE — Patient Instructions (Signed)
Colonoscopy on 9/15  9/10 - Take last dose of coumadin until after procedure.  9/17 - Take 7.5 mg of coumadin 9/18 - Take 7.5 mg of coumadin 9/19 - Resume current dosage of coumadin.  Re-check on 9/25.

## 2016-01-30 DIAGNOSIS — L929 Granulomatous disorder of the skin and subcutaneous tissue, unspecified: Secondary | ICD-10-CM | POA: Diagnosis not present

## 2016-02-03 ENCOUNTER — Encounter: Payer: Self-pay | Admitting: Gastroenterology

## 2016-02-03 ENCOUNTER — Encounter: Payer: Self-pay | Admitting: Cardiology

## 2016-02-17 ENCOUNTER — Ambulatory Visit (AMBULATORY_SURGERY_CENTER): Payer: Commercial Managed Care - HMO | Admitting: Gastroenterology

## 2016-02-17 ENCOUNTER — Encounter: Payer: Self-pay | Admitting: Gastroenterology

## 2016-02-17 VITALS — BP 107/71 | HR 69 | Temp 96.6°F | Resp 12 | Ht 65.0 in | Wt 199.0 lb

## 2016-02-17 DIAGNOSIS — I4891 Unspecified atrial fibrillation: Secondary | ICD-10-CM | POA: Diagnosis not present

## 2016-02-17 DIAGNOSIS — I10 Essential (primary) hypertension: Secondary | ICD-10-CM | POA: Diagnosis not present

## 2016-02-17 DIAGNOSIS — Z86010 Personal history of colonic polyps: Secondary | ICD-10-CM | POA: Diagnosis present

## 2016-02-17 DIAGNOSIS — Z800 Family history of malignant neoplasm of digestive organs: Secondary | ICD-10-CM

## 2016-02-17 DIAGNOSIS — D123 Benign neoplasm of transverse colon: Secondary | ICD-10-CM

## 2016-02-17 DIAGNOSIS — K5730 Diverticulosis of large intestine without perforation or abscess without bleeding: Secondary | ICD-10-CM | POA: Diagnosis not present

## 2016-02-17 DIAGNOSIS — I2510 Atherosclerotic heart disease of native coronary artery without angina pectoris: Secondary | ICD-10-CM | POA: Diagnosis not present

## 2016-02-17 DIAGNOSIS — K219 Gastro-esophageal reflux disease without esophagitis: Secondary | ICD-10-CM | POA: Diagnosis not present

## 2016-02-17 MED ORDER — SODIUM CHLORIDE 0.9 % IV SOLN: 500.0000 mL | INTRAVENOUS | Status: DC

## 2016-02-17 NOTE — Op Note (Signed)
[place] Patient Name: Shawn [name] Procedure Date: 02/17/2016 1:14 PM MRN: [id number] Endoscopist: Milus Banister , MD Age: 81 Referring MD:  Date of Birth: 02/05/1935 Gender: Male Account #: [id number] Procedure:                Colonoscopy Indications:              High risk colon cancer surveillance: Personal                            history of colonic polyps (2007 colonsocopy +TA),                            Family history of colon cancer in a first-degree                            relative Medicines:                Monitored Anesthesia Care Procedure:                Pre-Anesthesia Assessment:                           - Prior to the procedure, a History and Physical                            was performed, and patient medications and                            allergies were reviewed. The patient's tolerance of                            previous anesthesia was also reviewed. The risks                            and benefits of the procedure and the sedation                            options and risks were discussed with the patient.                            All questions were answered, and informed consent                            was obtained. Prior Anticoagulants: The patient has                            taken Coumadin (warfarin), last dose was 5 days                            prior to procedure. ASA Grade Assessment: II - A                            patient with mild systemic disease. After reviewing  the risks and benefits, the patient was deemed in                            satisfactory condition to undergo the procedure.                           After obtaining informed consent, the colonoscope                            was passed under direct vision. Throughout the                            procedure, the patient's blood pressure, pulse, and                            oxygen saturations were monitored  continuously. The                            [id number]) was introduced through the anus                            and advanced to the the cecum, identified by                            appendiceal orifice and ileocecal valve. The                            colonoscopy was performed without difficulty. The                            patient tolerated the procedure well. The quality                            of the bowel preparation was excellent. The                            ileocecal valve, appendiceal orifice, and rectum                            were photographed. Scope In: 1:27:12 PM Scope Out: 1:35:21 PM Scope Withdrawal Time: 0 hours 6 minutes 8 seconds  Total Procedure Duration: 0 hours 8 minutes 9 seconds  Findings:                 A 3 mm polyp was found in the transverse colon. The                            polyp was sessile. The polyp was removed with a                            cold snare. Resection and retrieval were complete.                           Multiple small and large-mouthed diverticula were  found in the left colon.                           The exam was otherwise without abnormality on                            direct and retroflexion views. Complications:            No immediate complications. Estimated blood loss:                            None. Estimated Blood Loss:     Estimated blood loss: none. Impression:               - One 3 mm polyp in the transverse colon, removed                            with a cold snare. Resected and retrieved.                           - Diverticulosis in the left colon.                           - The examination was otherwise normal on direct                            and retroflexion views. Recommendation:           - Patient has a contact number available for                            emergencies. The signs and symptoms of potential                            delayed complications  were discussed with the                            patient. Return to normal activities tomorrow.                            Written discharge instructions were provided to the                            patient.                           - Resume previous diet.                           - Continue present medications.                           - No repeat colonoscopy due to age. Await path                            results for final recommendations. Milus Banister, MD 02/17/2016 1:39:49 PM This report has been  signed electronically.

## 2016-02-17 NOTE — Patient Instructions (Signed)
Discharge instructions given. Handouts on polyps and diverticulosis. Resume previous medications. YOU HAD AN ENDOSCOPIC PROCEDURE TODAY AT THE [facility]:   Refer to the procedure report that was given to you for any specific questions about what was found during the examination.  If the procedure report does not answer your questions, please call your gastroenterologist to clarify.  If you requested that your care partner not be given the details of your procedure findings, then the procedure report has been included in a sealed envelope for you to review at your convenience later.  YOU SHOULD EXPECT: Some feelings of bloating in the abdomen. Passage of more gas than usual.  Walking can help get rid of the air that was put into your GI tract during the procedure and reduce the bloating. If you had a lower endoscopy (such as a colonoscopy or flexible sigmoidoscopy) you may notice spotting of blood in your stool or on the toilet paper. If you underwent a bowel prep for your procedure, you may not have a normal bowel movement for a few days.  Please Note:  You might notice some irritation and congestion in your nose or some drainage.  This is from the oxygen used during your procedure.  There is no need for concern and it should clear up in a day or so.  SYMPTOMS TO REPORT IMMEDIATELY:   Following lower endoscopy (colonoscopy or flexible sigmoidoscopy):  Excessive amounts of blood in the stool  Significant tenderness or worsening of abdominal pains  Swelling of the abdomen that is new, acute  Fever of 100F or higher   For urgent or emergent issues, a gastroenterologist can be reached at any hour by calling [phone].   DIET:  We do recommend a small meal at first, but then you may proceed to your regular diet.  Drink plenty of fluids but you should avoid alcoholic beverages for 24 hours.  ACTIVITY:  You should plan to take it easy for the rest of today and you should NOT  DRIVE or use heavy machinery until tomorrow (because of the sedation medicines used during the test).    FOLLOW UP: Our staff will call the number listed on your records the next business day following your procedure to check on you and address any questions or concerns that you may have regarding the information given to you following your procedure. If we do not reach you, we will leave a message.  However, if you are feeling well and you are not experiencing any problems, there is no need to return our call.  We will assume that you have returned to your regular daily activities without incident.  If any biopsies were taken you will be contacted by phone or by letter within the next 1-3 weeks.  Please call us at [phone] if you have not heard about the biopsies in 3 weeks.    SIGNATURES/CONFIDENTIALITY: You and/or your care partner have signed paperwork which will be entered into your electronic medical record.  These signatures attest to the fact that that the information above on your After Visit Summary has been reviewed and is understood.  Full responsibility of the confidentiality of this discharge information lies with you and/or your care-partner. 

## 2016-02-17 NOTE — Progress Notes (Signed)
Report to PACU, RN, vss, BBS= Clear.  

## 2016-02-20 ENCOUNTER — Telehealth: Payer: Self-pay | Admitting: *Deleted

## 2016-02-20 NOTE — Telephone Encounter (Signed)
  Follow up Call-  Call back number 02/17/2016  Post procedure Call Back phone  # [phone] home, [place] cell  Permission to leave phone message Yes  Some recent data might be hidden     Patient questions:  Message left to call us if necessary.

## 2016-02-22 ENCOUNTER — Encounter: Payer: Self-pay | Admitting: Gastroenterology

## 2016-02-27 ENCOUNTER — Ambulatory Visit: Payer: Commercial Managed Care - HMO

## 2016-02-27 ENCOUNTER — Ambulatory Visit (INDEPENDENT_AMBULATORY_CARE_PROVIDER_SITE_OTHER): Payer: Commercial Managed Care - HMO | Admitting: General Practice

## 2016-02-27 DIAGNOSIS — I4891 Unspecified atrial fibrillation: Secondary | ICD-10-CM | POA: Diagnosis not present

## 2016-02-27 DIAGNOSIS — Z5181 Encounter for therapeutic drug level monitoring: Secondary | ICD-10-CM

## 2016-02-27 LAB — POCT INR: INR: 2.4

## 2016-03-07 ENCOUNTER — Other Ambulatory Visit: Payer: Self-pay | Admitting: Family Medicine

## 2016-03-26 ENCOUNTER — Ambulatory Visit: Payer: Commercial Managed Care - HMO

## 2016-04-02 ENCOUNTER — Ambulatory Visit (INDEPENDENT_AMBULATORY_CARE_PROVIDER_SITE_OTHER): Payer: Commercial Managed Care - HMO | Admitting: General Practice

## 2016-04-02 DIAGNOSIS — Z23 Encounter for immunization: Secondary | ICD-10-CM | POA: Diagnosis not present

## 2016-04-02 DIAGNOSIS — I4891 Unspecified atrial fibrillation: Secondary | ICD-10-CM

## 2016-04-02 DIAGNOSIS — Z5181 Encounter for therapeutic drug level monitoring: Secondary | ICD-10-CM

## 2016-04-02 LAB — POCT INR: INR: 2.6

## 2016-04-02 NOTE — Patient Instructions (Signed)
Pre visit review using our clinic review tool, if applicable. No additional management support is needed unless otherwise documented below in the visit note. 

## 2016-04-03 NOTE — Progress Notes (Signed)
HPI The patient presents for yearly followup. Since last saw him he has done well.  The patient denies any new symptoms such as chest discomfort, neck or arm discomfort. There has been no new shortness of breath, PND or orthopnea. There have been no reported palpitations, presyncope or syncope.  He is dating and enjoying life.  He does his yard work without limitations.    No Known Allergies  Current Outpatient Prescriptions  Medication Sig Dispense Refill  . atorvastatin (LIPITOR) 40 MG tablet Take 1 tablet by mouth  every day 90 tablet 3  . CARTIA XT 240 MG 24 hr capsule TAKE 1 CAPSULE EVERY DAY 90 capsule 3  . fluticasone (FLONASE) 50 MCG/ACT nasal spray Place 2 sprays into both nostrils daily. 48 g 3  . metoprolol succinate (TOPROL-XL) 50 MG 24 hr tablet TAKE 1 AND 1/2 TABLETS ONE TIME DAILY 135 tablet 1  . Multiple Vitamins-Minerals (MULTIVITAMIN & MINERAL PO) Take 1 tablet by mouth daily.    . nitroGLYCERIN (NITROSTAT) 0.4 MG SL tablet Place 0.4 mg under the tongue every 5 (five) minutes x 3 doses as needed for chest pain.    Marland Kitchen omeprazole (PRILOSEC) 20 MG capsule Take 1 capsule (20 mg total) by mouth daily. 90 capsule 3  . tamsulosin (FLOMAX) 0.4 MG CAPS capsule TAKE 1 CAPSULE EVERY DAY 90 capsule 2  . warfarin (COUMADIN) 2.5 MG tablet TAKE AS DIRECTED BY THE COUMADIN CLINIC 135 tablet 3   Current Facility-Administered Medications  Medication Dose Route Frequency Provider Last Rate Last Dose  . 0.9 %  sodium chloride infusion  500 mL Intravenous Continuous Milus Banister, MD        Past Medical History:  Diagnosis Date  . Actinic keratosis   . Adenomatous colon polyp   . ALLERGIC RHINITIS   . Arthritis   . CAD, NATIVE VESSEL    a. DES x2 to RCA 2004 b. repeat PTCA to RCA 2008 c. PCI to RCA 2012 non-DES   . Cellulitis and abscess of trunk   . Diverticulosis   . GERD (gastroesophageal reflux disease)   . HYPERLIPIDEMIA   . HYPERTENSION   . OTH MALIG NEOPLASM SKIN  OTH\T\UNSPEC PARTS FACE   . Permanent atrial fibrillation ([place])   . Skin cancer   . Tachy-brady syndrome ([place])    a. s/p MDT dual chamber pacemaker implant complicated by pericardial effusion and micro-perforatation s/p lead revisions    Past Surgical History:  Procedure Laterality Date  . BACK SURGERY  2000   "for bulging discs"  . CATARACT EXTRACTION W/ INTRAOCULAR LENS  IMPLANT, BILATERAL  2012  . CORONARY ANGIOPLASTY WITH STENT PLACEMENT  2008  . CORONARY ANGIOPLASTY WITH STENT PLACEMENT  05/14/11  . CYSTECTOMY     from right side of face.  . EP IMPLANTABLE DEVICE N/A 12/16/2014   Procedure: PPM Generator Changeout;  Surgeon: Evans Lance, MD;  Location: [facility];  Service: Cardiovascular;  Laterality: N/A;  . LEAD REVISION  2008   11 days post initial implant, RA and RV lead revisions for micro-perforation and pericardial effusion by Dr Lovena Le  . LEFT HEART CATHETERIZATION WITH CORONARY ANGIOGRAM N/A 05/14/2011   Procedure: LEFT HEART CATHETERIZATION WITH CORONARY ANGIOGRAM;  Surgeon: Hillary Bow, MD;  Location: [facility];  Service: Cardiovascular;  Laterality: N/A;  . PACEMAKER INSERTION  2008   MDT dual chamber pacemaker implanted by Dr Percival Spanish    ROS: As stated in the HPI and  negative for all other systems.  PHYSICAL EXAM BP (!) 110/56 (BP Location: Left Arm, Patient Position: Sitting, Cuff Size: Normal)   Pulse 82   Ht 5\' 5"  (1.651 m)   Wt 203 lb 8 oz (92.3 kg)   SpO2 97%   BMI 33.86 kg/m  GENERAL:  Well appearing NECK:  No jugular venous distention, waveform within normal limits, carotid upstroke brisk and symmetric, no bruits, no thyromegaly LUNGS:  Clear to auscultation bilaterally BACK:  No CVA tenderness CHEST:  Pacer scar well healed.  HEART:  PMI not displaced or sustained,S1 and S2 within normal limits, no S3, no clicks, no rubs, no murmurs, irregular ABD:  Flat, positive bowel sounds normal in frequency in pitch, no bruits, no rebound, no  guarding, no midline pulsatile mass, no hepatomegaly, no splenomegaly EXT:  2 plus pulses throughout, mild bilateral lower extremity edema, no cyanosis no clubbing.     Lab Results  Component Value Date   CHOL 97 (L) 04/04/2016   TRIG 153 (H) 04/04/2016   HDL 33 (L) 04/04/2016   LDLCALC 33 04/04/2016   LDLDIRECT 54.0 11/21/2015    ASSESSMENT AND PLAN  ATRIAL FIBRILLATION -  The patient tolerates this rhythm with rate control and anticoagulation. He is not interested in switching to a novel oral anticoagulant.  He will continue on warfarin.   Mr. MUSTAF FOUTCH has a CHA2DS2 - VASc score of 3 with a risk of stroke of 3.2%.    CAD, NATIVE VESSEL -  He had a stress perfusion study in 2012 demonstrated no high risk findings.  He has had no new symptoms since then. He will continue with risk reduction.  His is going to stop ASA.   PACEMAKER, PERMANENT -  He is up to date with follow up.  And was seen in August in the office with q 3 month remote transmission.    DYSLIPIDEMIA - I will check a lipid profile and liver enzymes.

## 2016-04-04 ENCOUNTER — Ambulatory Visit (INDEPENDENT_AMBULATORY_CARE_PROVIDER_SITE_OTHER): Payer: Commercial Managed Care - HMO | Admitting: Cardiology

## 2016-04-04 ENCOUNTER — Encounter: Payer: Self-pay | Admitting: Cardiology

## 2016-04-04 VITALS — BP 110/56 | HR 82 | Ht 65.0 in | Wt 203.5 lb

## 2016-04-04 DIAGNOSIS — E785 Hyperlipidemia, unspecified: Secondary | ICD-10-CM

## 2016-04-04 DIAGNOSIS — I4891 Unspecified atrial fibrillation: Secondary | ICD-10-CM | POA: Diagnosis not present

## 2016-04-04 DIAGNOSIS — Z79899 Other long term (current) drug therapy: Secondary | ICD-10-CM

## 2016-04-04 LAB — HEPATIC FUNCTION PANEL
ALT: 19 U/L (ref 9–46)
AST: 16 U/L (ref 10–35)
Albumin: 3.9 g/dL (ref 3.6–5.1)
Alkaline Phosphatase: 61 U/L (ref 40–115)
BILIRUBIN DIRECT: 0.2 mg/dL (ref <=0.2)
BILIRUBIN INDIRECT: 0.7 mg/dL (ref 0.2–1.2)
BILIRUBIN TOTAL: 0.9 mg/dL (ref 0.2–1.2)
Total Protein: 6.6 g/dL (ref 6.1–8.1)

## 2016-04-04 LAB — LIPID PANEL
CHOL/HDL RATIO: 2.9 ratio (ref <=5.0)
CHOLESTEROL: 97 mg/dL — AB (ref 125–200)
HDL: 33 mg/dL — AB (ref >=40)
LDL Cholesterol: 33 mg/dL (ref <130)
TRIGLYCERIDES: 153 mg/dL — AB (ref <150)
VLDL: 31 mg/dL — AB (ref <30)

## 2016-04-04 NOTE — Patient Instructions (Signed)
Medication Instructions:  STOP- Aspirin  Labwork: Fasting Lipid Liver  Testing/Procedures: None Ordered  Follow-Up: Your physician wants you to follow-up in: 1 Year. You will receive a reminder letter in the mail two months in advance. If you don't receive a letter, please call our office to schedule the follow-up appointment.   Any Other Special Instructions Will Be Listed Below (If Applicable).     If you need a refill on your cardiac medications before your next appointment, please call your pharmacy.

## 2016-04-06 ENCOUNTER — Ambulatory Visit (INDEPENDENT_AMBULATORY_CARE_PROVIDER_SITE_OTHER): Payer: Commercial Managed Care - HMO | Admitting: *Deleted

## 2016-04-06 DIAGNOSIS — I495 Sick sinus syndrome: Secondary | ICD-10-CM

## 2016-04-06 NOTE — Progress Notes (Signed)
Remote pacemaker transmission.   

## 2016-04-11 ENCOUNTER — Encounter: Payer: Self-pay | Admitting: Cardiology

## 2016-04-30 ENCOUNTER — Ambulatory Visit: Payer: Commercial Managed Care - HMO

## 2016-05-02 ENCOUNTER — Ambulatory Visit (INDEPENDENT_AMBULATORY_CARE_PROVIDER_SITE_OTHER): Payer: Commercial Managed Care - HMO | Admitting: General Practice

## 2016-05-02 ENCOUNTER — Other Ambulatory Visit: Payer: Self-pay | Admitting: General Practice

## 2016-05-02 DIAGNOSIS — I4891 Unspecified atrial fibrillation: Secondary | ICD-10-CM

## 2016-05-02 DIAGNOSIS — Z5181 Encounter for therapeutic drug level monitoring: Secondary | ICD-10-CM

## 2016-05-02 LAB — POCT INR: INR: 3.1

## 2016-05-02 MED ORDER — ATORVASTATIN CALCIUM 40 MG PO TABS: ORAL_TABLET | ORAL | 1 refills | Status: DC

## 2016-05-02 NOTE — Patient Instructions (Signed)
Pre visit review using our clinic review tool, if applicable. No additional management support is needed unless otherwise documented below in the visit note. 

## 2016-05-08 DIAGNOSIS — L570 Actinic keratosis: Secondary | ICD-10-CM | POA: Diagnosis not present

## 2016-05-08 DIAGNOSIS — L820 Inflamed seborrheic keratosis: Secondary | ICD-10-CM | POA: Diagnosis not present

## 2016-05-08 DIAGNOSIS — L814 Other melanin hyperpigmentation: Secondary | ICD-10-CM | POA: Diagnosis not present

## 2016-05-08 DIAGNOSIS — D1801 Hemangioma of skin and subcutaneous tissue: Secondary | ICD-10-CM | POA: Diagnosis not present

## 2016-05-08 DIAGNOSIS — D225 Melanocytic nevi of trunk: Secondary | ICD-10-CM | POA: Diagnosis not present

## 2016-05-08 DIAGNOSIS — L821 Other seborrheic keratosis: Secondary | ICD-10-CM | POA: Diagnosis not present

## 2016-05-08 DIAGNOSIS — Z85828 Personal history of other malignant neoplasm of skin: Secondary | ICD-10-CM | POA: Diagnosis not present

## 2016-05-08 LAB — CUP PACEART REMOTE DEVICE CHECK
Battery Remaining Longevity: 106 mo
Implantable Lead Implant Date: 20081007
Implantable Lead Location: 753860
Implantable Lead Model: 5076
Implantable Lead Model: 5076
Lead Channel Impedance Value: 573 Ohm
Lead Channel Pacing Threshold Amplitude: 0.75 V
Lead Channel Pacing Threshold Pulse Width: 0.4 ms
MDC IDC LEAD IMPLANT DT: 20081007
MDC IDC LEAD LOCATION: 753859
MDC IDC MSMT BATTERY IMPEDANCE: 250 Ohm
MDC IDC MSMT BATTERY VOLTAGE: 2.78 V
MDC IDC MSMT LEADCHNL RA IMPEDANCE VALUE: 0 Ohm
MDC IDC PG IMPLANT DT: 20160714
MDC IDC SESS DTM: 20171103111438
MDC IDC SET LEADCHNL RV PACING AMPLITUDE: 2 V
MDC IDC SET LEADCHNL RV PACING PULSEWIDTH: 0.4 ms
MDC IDC SET LEADCHNL RV SENSING SENSITIVITY: 1.4 mV
MDC IDC STAT BRADY RV PERCENT PACED: 61 %

## 2016-06-06 ENCOUNTER — Ambulatory Visit (INDEPENDENT_AMBULATORY_CARE_PROVIDER_SITE_OTHER): Payer: Commercial Managed Care - HMO | Admitting: General Practice

## 2016-06-06 DIAGNOSIS — Z5181 Encounter for therapeutic drug level monitoring: Secondary | ICD-10-CM

## 2016-06-06 DIAGNOSIS — I4891 Unspecified atrial fibrillation: Secondary | ICD-10-CM

## 2016-06-06 LAB — POCT INR: INR: 2.3

## 2016-06-06 NOTE — Patient Instructions (Signed)
Pre visit review using our clinic review tool, if applicable. No additional management support is needed unless otherwise documented below in the visit note. 

## 2016-07-05 ENCOUNTER — Other Ambulatory Visit: Payer: Self-pay | Admitting: Family Medicine

## 2016-07-09 ENCOUNTER — Ambulatory Visit (INDEPENDENT_AMBULATORY_CARE_PROVIDER_SITE_OTHER): Payer: Medicare PPO | Admitting: *Deleted

## 2016-07-09 DIAGNOSIS — I495 Sick sinus syndrome: Secondary | ICD-10-CM

## 2016-07-10 LAB — CUP PACEART REMOTE DEVICE CHECK
Battery Impedance: 298 Ohm
Battery Voltage: 2.78 V
Implantable Lead Location: 753859
Implantable Lead Model: 5076
Implantable Pulse Generator Implant Date: 20160714
Lead Channel Pacing Threshold Pulse Width: 0.4 ms
Lead Channel Setting Sensing Sensitivity: 1.4 mV
MDC IDC LEAD IMPLANT DT: 20081007
MDC IDC LEAD IMPLANT DT: 20081007
MDC IDC LEAD LOCATION: 753860
MDC IDC MSMT BATTERY REMAINING LONGEVITY: 103 mo
MDC IDC MSMT LEADCHNL RA IMPEDANCE VALUE: 0 Ohm
MDC IDC MSMT LEADCHNL RV IMPEDANCE VALUE: 550 Ohm
MDC IDC MSMT LEADCHNL RV PACING THRESHOLD AMPLITUDE: 0.75 V
MDC IDC SESS DTM: 20180205154032
MDC IDC SET LEADCHNL RV PACING AMPLITUDE: 2 V
MDC IDC SET LEADCHNL RV PACING PULSEWIDTH: 0.4 ms
MDC IDC STAT BRADY RV PERCENT PACED: 57 %

## 2016-07-10 NOTE — Progress Notes (Signed)
Remote pacemaker transmission.   

## 2016-07-11 ENCOUNTER — Encounter: Payer: Self-pay | Admitting: Cardiology

## 2016-07-16 ENCOUNTER — Ambulatory Visit (INDEPENDENT_AMBULATORY_CARE_PROVIDER_SITE_OTHER): Payer: Medicare PPO | Admitting: General Practice

## 2016-07-16 DIAGNOSIS — I4891 Unspecified atrial fibrillation: Secondary | ICD-10-CM | POA: Diagnosis not present

## 2016-07-16 DIAGNOSIS — Z5181 Encounter for therapeutic drug level monitoring: Secondary | ICD-10-CM | POA: Diagnosis not present

## 2016-07-16 LAB — POCT INR: INR: 2.5

## 2016-07-16 NOTE — Patient Instructions (Signed)
Pre visit review using our clinic review tool, if applicable. No additional management support is needed unless otherwise documented below in the visit note. 

## 2016-07-18 ENCOUNTER — Ambulatory Visit: Payer: Commercial Managed Care - HMO

## 2016-07-18 ENCOUNTER — Other Ambulatory Visit: Payer: Self-pay

## 2016-07-18 MED ORDER — OMEPRAZOLE 20 MG PO CPDR: 20.0000 mg | DELAYED_RELEASE_CAPSULE | Freq: Every day | ORAL | 3 refills | Status: DC

## 2016-08-27 ENCOUNTER — Ambulatory Visit (INDEPENDENT_AMBULATORY_CARE_PROVIDER_SITE_OTHER): Payer: Medicare PPO | Admitting: General Practice

## 2016-08-27 DIAGNOSIS — I4891 Unspecified atrial fibrillation: Secondary | ICD-10-CM

## 2016-08-27 DIAGNOSIS — Z5181 Encounter for therapeutic drug level monitoring: Secondary | ICD-10-CM

## 2016-08-27 LAB — POCT INR: INR: 2.3

## 2016-08-27 NOTE — Patient Instructions (Signed)
Pre visit review using our clinic review tool, if applicable. No additional management support is needed unless otherwise documented below in the visit note. 

## 2016-08-27 NOTE — Progress Notes (Signed)
I have reviewed and agree with note, evaluation, plan.   Stephen Hunter, MD  

## 2016-09-10 ENCOUNTER — Other Ambulatory Visit: Payer: Self-pay | Admitting: Family Medicine

## 2016-09-10 ENCOUNTER — Telehealth: Payer: Self-pay | Admitting: Family Medicine

## 2016-09-10 ENCOUNTER — Other Ambulatory Visit: Payer: Self-pay

## 2016-09-10 MED ORDER — ATORVASTATIN CALCIUM 40 MG PO TABS: ORAL_TABLET | ORAL | 1 refills | Status: DC

## 2016-09-10 NOTE — Telephone Encounter (Signed)
Prescription was faxed to pharmacy as requested.

## 2016-09-10 NOTE — Telephone Encounter (Signed)
° °  Pt request refill of the following:  atorvastatin (LIPITOR) 40 MG tablet   Phamacy: [facility]

## 2016-09-11 NOTE — Telephone Encounter (Signed)
closed

## 2016-10-01 ENCOUNTER — Other Ambulatory Visit: Payer: Self-pay | Admitting: Family Medicine

## 2016-10-08 ENCOUNTER — Ambulatory Visit (INDEPENDENT_AMBULATORY_CARE_PROVIDER_SITE_OTHER): Payer: Medicare PPO | Admitting: *Deleted

## 2016-10-08 ENCOUNTER — Telehealth: Payer: Self-pay | Admitting: Cardiology

## 2016-10-08 ENCOUNTER — Ambulatory Visit (INDEPENDENT_AMBULATORY_CARE_PROVIDER_SITE_OTHER): Payer: Medicare PPO | Admitting: General Practice

## 2016-10-08 DIAGNOSIS — I495 Sick sinus syndrome: Secondary | ICD-10-CM

## 2016-10-08 DIAGNOSIS — Z5181 Encounter for therapeutic drug level monitoring: Secondary | ICD-10-CM

## 2016-10-08 DIAGNOSIS — I4891 Unspecified atrial fibrillation: Secondary | ICD-10-CM

## 2016-10-08 LAB — POCT INR: INR: 2.6

## 2016-10-08 NOTE — Patient Instructions (Signed)
Pre visit review using our clinic review tool, if applicable. No additional management support is needed unless otherwise documented below in the visit note. 

## 2016-10-08 NOTE — Telephone Encounter (Signed)
Spoke with pt and reminded pt of remote transmission that is due today. Pt verbalized understanding.   

## 2016-10-08 NOTE — Progress Notes (Signed)
I have reviewed and agree with note, evaluation, plan.   Nayden Czajka, MD  

## 2016-10-09 LAB — CUP PACEART REMOTE DEVICE CHECK
Battery Remaining Longevity: 100 mo
Battery Voltage: 2.78 V
Brady Statistic RV Percent Paced: 57 %
Implantable Lead Implant Date: 20081007
Implantable Lead Location: 753859
Implantable Lead Location: 753860
Implantable Lead Model: 5076
Implantable Pulse Generator Implant Date: 20160714
Lead Channel Impedance Value: 500 Ohm
Lead Channel Pacing Threshold Amplitude: 0.75 V
Lead Channel Setting Pacing Pulse Width: 0.4 ms
Lead Channel Setting Sensing Sensitivity: 1.4 mV
MDC IDC LEAD IMPLANT DT: 20081007
MDC IDC MSMT BATTERY IMPEDANCE: 346 Ohm
MDC IDC MSMT LEADCHNL RA IMPEDANCE VALUE: 0 Ohm
MDC IDC MSMT LEADCHNL RV PACING THRESHOLD PULSEWIDTH: 0.4 ms
MDC IDC SESS DTM: 20180507173635
MDC IDC SET LEADCHNL RV PACING AMPLITUDE: 2 V

## 2016-10-09 NOTE — Progress Notes (Signed)
Remote pacemaker transmission.   

## 2016-10-12 ENCOUNTER — Encounter: Payer: Self-pay | Admitting: Cardiology

## 2016-11-19 ENCOUNTER — Ambulatory Visit (INDEPENDENT_AMBULATORY_CARE_PROVIDER_SITE_OTHER): Payer: Medicare PPO | Admitting: General Practice

## 2016-11-19 ENCOUNTER — Other Ambulatory Visit: Payer: Self-pay | Admitting: General Practice

## 2016-11-19 DIAGNOSIS — I4891 Unspecified atrial fibrillation: Secondary | ICD-10-CM

## 2016-11-19 DIAGNOSIS — Z5181 Encounter for therapeutic drug level monitoring: Secondary | ICD-10-CM | POA: Diagnosis not present

## 2016-11-19 LAB — POCT INR: INR: 3.1

## 2016-11-19 MED ORDER — ATORVASTATIN CALCIUM 40 MG PO TABS: 40.0000 mg | ORAL_TABLET | Freq: Every day | ORAL | 0 refills | Status: DC

## 2016-11-19 MED ORDER — WARFARIN SODIUM 2.5 MG PO TABS: ORAL_TABLET | ORAL | 1 refills | Status: DC

## 2016-11-19 MED ORDER — ATORVASTATIN CALCIUM 40 MG PO TABS
40.0000 mg | ORAL_TABLET | Freq: Every day | ORAL | 0 refills | Status: DC
Start: 2016-11-19 — End: 2016-11-19

## 2016-11-19 NOTE — Patient Instructions (Signed)
Pre visit review using our clinic review tool, if applicable. No additional management support is needed unless otherwise documented below in the visit note. 

## 2016-11-19 NOTE — Progress Notes (Signed)
I have reviewed and agree with note, evaluation, plan.   Stephen Hunter, MD  

## 2016-11-29 ENCOUNTER — Other Ambulatory Visit: Payer: Self-pay | Admitting: Family Medicine

## 2016-12-31 ENCOUNTER — Ambulatory Visit: Payer: Medicare PPO

## 2017-01-07 ENCOUNTER — Ambulatory Visit (INDEPENDENT_AMBULATORY_CARE_PROVIDER_SITE_OTHER): Payer: Medicare PPO | Admitting: General Practice

## 2017-01-07 ENCOUNTER — Ambulatory Visit (INDEPENDENT_AMBULATORY_CARE_PROVIDER_SITE_OTHER): Payer: Medicare PPO | Admitting: *Deleted

## 2017-01-07 DIAGNOSIS — I4891 Unspecified atrial fibrillation: Secondary | ICD-10-CM | POA: Diagnosis not present

## 2017-01-07 DIAGNOSIS — Z5181 Encounter for therapeutic drug level monitoring: Secondary | ICD-10-CM

## 2017-01-07 DIAGNOSIS — I495 Sick sinus syndrome: Secondary | ICD-10-CM | POA: Diagnosis not present

## 2017-01-07 LAB — POCT INR: INR: 2.6

## 2017-01-07 NOTE — Patient Instructions (Signed)
Pre visit review using our clinic review tool, if applicable. No additional management support is needed unless otherwise documented below in the visit note. 

## 2017-01-07 NOTE — Progress Notes (Signed)
I have reviewed and agree with note, evaluation, plan.   Davit Vassar, MD  

## 2017-01-08 NOTE — Progress Notes (Signed)
Remote pacemaker transmission.   

## 2017-01-09 ENCOUNTER — Encounter: Payer: Self-pay | Admitting: Cardiology

## 2017-01-25 LAB — CUP PACEART REMOTE DEVICE CHECK
Battery Impedance: 395 Ohm
Battery Remaining Longevity: 96 mo
Battery Voltage: 2.77 V
Implantable Lead Implant Date: 20081007
Implantable Lead Location: 753859
Implantable Lead Model: 5076
Implantable Lead Model: 5076
Implantable Pulse Generator Implant Date: 20160714
Lead Channel Pacing Threshold Amplitude: 0.75 V
Lead Channel Pacing Threshold Pulse Width: 0.4 ms
Lead Channel Setting Pacing Amplitude: 2 V
Lead Channel Setting Sensing Sensitivity: 1.4 mV
MDC IDC LEAD IMPLANT DT: 20081007
MDC IDC LEAD LOCATION: 753860
MDC IDC MSMT LEADCHNL RA IMPEDANCE VALUE: 0 Ohm
MDC IDC MSMT LEADCHNL RV IMPEDANCE VALUE: 514 Ohm
MDC IDC SESS DTM: 20180806200530
MDC IDC SET LEADCHNL RV PACING PULSEWIDTH: 0.4 ms
MDC IDC STAT BRADY RV PERCENT PACED: 58 %

## 2017-01-29 ENCOUNTER — Other Ambulatory Visit: Payer: Self-pay | Admitting: Family Medicine

## 2017-02-12 ENCOUNTER — Ambulatory Visit (INDEPENDENT_AMBULATORY_CARE_PROVIDER_SITE_OTHER): Payer: Medicare PPO | Admitting: Internal Medicine

## 2017-02-12 ENCOUNTER — Encounter: Payer: Self-pay | Admitting: Internal Medicine

## 2017-02-12 VITALS — BP 124/72 | HR 86 | Ht 67.0 in | Wt 205.4 lb

## 2017-02-12 DIAGNOSIS — I4891 Unspecified atrial fibrillation: Secondary | ICD-10-CM

## 2017-02-12 DIAGNOSIS — I495 Sick sinus syndrome: Secondary | ICD-10-CM

## 2017-02-12 NOTE — Progress Notes (Signed)
HPI Mr. Ostrovsky returns today for ongoing evaluation and management of his atrial fibrillation and intermittent heart block status post pacemaker insertion. Since I saw the patient in the last year, he has done well. He does admit to some dietary indiscretion and has gained 7 pounds. He denies chest pain. He has minimal dyspnea with exertion and chronic trace peripheral edema. No syncope. He denies any anginal symptoms. No Known Allergies   Current Outpatient Prescriptions  Medication Sig Dispense Refill  . atorvastatin (LIPITOR) 40 MG tablet Take 1 tablet (40 mg total) by mouth daily. 90 tablet 0  . CARTIA XT 240 MG 24 hr capsule TAKE 1 CAPSULE EVERY DAY 90 capsule 3  . fluticasone (FLONASE) 50 MCG/ACT nasal spray Place 2 sprays into both nostrils daily. 48 g 3  . metoprolol succinate (TOPROL-XL) 50 MG 24 hr tablet TAKE 1 AND 1/2 TABLETS ONE TIME DAILY 135 tablet 0  . Multiple Vitamins-Minerals (MULTIVITAMIN & MINERAL PO) Take 1 tablet by mouth daily.    . nitroGLYCERIN (NITROSTAT) 0.4 MG SL tablet Place 0.4 mg under the tongue every 5 (five) minutes x 3 doses as needed for chest pain.    Marland Kitchen omeprazole (PRILOSEC) 20 MG capsule Take 1 capsule (20 mg total) by mouth daily. 90 capsule 3  . tamsulosin (FLOMAX) 0.4 MG CAPS capsule TAKE 1 CAPSULE EVERY DAY 90 capsule 1  . warfarin (COUMADIN) 2.5 MG tablet TAKE AS DIRECTED BY THE COUMADIN CLINIC 135 tablet 1   Current Facility-Administered Medications  Medication Dose Route Frequency Provider Last Rate Last Dose  . 0.9 %  sodium chloride infusion  500 mL Intravenous Continuous Milus Banister, MD         Past Medical History:  Diagnosis Date  . Actinic keratosis   . Adenomatous colon polyp   . ALLERGIC RHINITIS   . Arthritis   . CAD, NATIVE VESSEL    a. DES x2 to RCA 2004 b. repeat PTCA to RCA 2008 c. PCI to RCA 2012 non-DES   . Cellulitis and abscess of trunk   . Diverticulosis   . GERD (gastroesophageal reflux disease)   .  HYPERLIPIDEMIA   . HYPERTENSION   . OTH MALIG NEOPLASM SKIN OTH\T\UNSPEC PARTS FACE   . Permanent atrial fibrillation ([place])   . Skin cancer   . Tachy-brady syndrome ([place])    a. s/p MDT dual chamber pacemaker implant complicated by pericardial effusion and micro-perforatation s/p lead revisions    ROS:   All systems reviewed and negative except as noted in the HPI.   Past Surgical History:  Procedure Laterality Date  . BACK SURGERY  2000   "for bulging discs"  . CATARACT EXTRACTION W/ INTRAOCULAR LENS  IMPLANT, BILATERAL  2012  . CORONARY ANGIOPLASTY WITH STENT PLACEMENT  2008  . CORONARY ANGIOPLASTY WITH STENT PLACEMENT  05/14/11  . CYSTECTOMY     from right side of face.  . EP IMPLANTABLE DEVICE N/A 12/16/2014   Procedure: PPM Generator Changeout;  Surgeon: Evans Lance, MD;  Location: [facility];  Service: Cardiovascular;  Laterality: N/A;  . LEAD REVISION  2008   11 days post initial implant, RA and RV lead revisions for micro-perforation and pericardial effusion by Dr Lovena Le  . LEFT HEART CATHETERIZATION WITH CORONARY ANGIOGRAM N/A 05/14/2011   Procedure: LEFT HEART CATHETERIZATION WITH CORONARY ANGIOGRAM;  Surgeon: Hillary Bow, MD;  Location: [facility];  Service: Cardiovascular;  Laterality: N/A;  . PACEMAKER INSERTION  2008   MDT dual chamber pacemaker implanted by Dr Percival Spanish     Family History  Problem Relation Age of Onset  . Ovarian cancer Mother   . Heart attack Father   . Colon cancer Father 2       deceased 71  . Diabetes Brother        x 2  . Heart disease Brother        x 3     Social History   Social History  . Marital status: Married    Spouse name: N/A  . Number of children: 2  . Years of education: N/A   Occupational History  . retired    Social History Main Topics  . Smoking status: Never Smoker  . Smokeless tobacco: Never Used  . Alcohol use No     Comment: 05/14/11 "last occasional drink was in 1990's"  . Drug use:  No  . Sexual activity: Yes   Other Topics Concern  . Not on file   Social History Narrative   Widowed March 2016. 2 sons. 4 grandkids- all through college. 1 greatgrandchild on way 11/2015.    Lives by himself.       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     BP 124/72   Pulse 86   Ht 5\' 7"  (1.702 m)   Wt 205 lb 6.4 oz (93.2 kg)   SpO2 96%   BMI 32.17 kg/m   Physical Exam:  Well appearing60 year old man, NAD HEENT: Unremarkable Neck:  6 cm JVD, no thyromegally Lymphatics:  No adenopathy Back:  No CVA tenderness Lungs:  Clear, with no wheezes, rales, or rhonchi. HEART:  IRegular rate rhythm, no murmurs, no rubs, no clicks Abd:  soft, positive bowel sounds, no organomegally, no rebound, no guarding Ext:  2 plus pulses, no edema, no cyanosis, no clubbing Skin:  No rashes no nodules Neuro:  CN II through XII intact, motor grossly intact  EKG - atrial fibrillation with a controlled ventricular response  DEVICE  Normal device function.  See PaceArt for details.   Assess/Plan: 1. Atrial fibrillation - his ventricular rate is well controlled. He will continue systemic anticoagulation with warfarin. 2. Hypertension - his blood pressure is well controlled. He is encouraged to lose weight. He is encouraged to maintain a low-sodium diet. 3. Pacemaker -  his Medtronic dual-chamber pacemaker is programmed VVIR. He is pacing approximately 60% of the time. His device was left unchanged.  Cristopher Peru, M.D.

## 2017-02-12 NOTE — Patient Instructions (Signed)
Medication Instructions:  Your physician recommends that you continue on your current medications as directed. Please refer to the Current Medication list given to you today.  Labwork: None ordered.  Testing/Procedures: None ordered.  Follow-Up: Your physician wants you to follow-up in: one year with Dr. Lovena Le.   You will receive a reminder letter in the mail two months in advance. If you don't receive a letter, please call our office to schedule the follow-up appointment.  Remote monitoring is used to monitor your Pacemaker from home. This monitoring reduces the number of office visits required to check your device to one time per year. It allows [place] to keep an eye on the functioning of your device to ensure it is working properly. You are scheduled for a device check from home on 04/08/2017. You may send your transmission at any time that day. If you have a wireless device, the transmission will be sent automatically. After your physician reviews your transmission, you will receive a postcard with your next transmission date.    Any Other Special Instructions Will Be Listed Below (If Applicable).     If you need a refill on your cardiac medications before your next appointment, please call your pharmacy.

## 2017-02-15 LAB — CUP PACEART INCLINIC DEVICE CHECK
Battery Impedance: 371 Ohm
Battery Remaining Longevity: 98 mo
Brady Statistic RV Percent Paced: 58 %
Implantable Lead Implant Date: 20081007
Implantable Lead Location: 753860
Implantable Lead Model: 5076
Implantable Lead Model: 5076
Lead Channel Impedance Value: 0 Ohm
Lead Channel Impedance Value: 507 Ohm
Lead Channel Pacing Threshold Amplitude: 0.75 V
Lead Channel Pacing Threshold Pulse Width: 0.4 ms
Lead Channel Setting Pacing Pulse Width: 0.4 ms
MDC IDC LEAD IMPLANT DT: 20081007
MDC IDC LEAD LOCATION: 753859
MDC IDC MSMT BATTERY VOLTAGE: 2.78 V
MDC IDC MSMT LEADCHNL RV PACING THRESHOLD AMPLITUDE: 0.875 V
MDC IDC MSMT LEADCHNL RV PACING THRESHOLD PULSEWIDTH: 0.4 ms
MDC IDC MSMT LEADCHNL RV SENSING INTR AMPL: 4 mV
MDC IDC PG IMPLANT DT: 20160714
MDC IDC SESS DTM: 20180911144959
MDC IDC SET LEADCHNL RV PACING AMPLITUDE: 2 V
MDC IDC SET LEADCHNL RV SENSING SENSITIVITY: 1.4 mV

## 2017-02-18 ENCOUNTER — Ambulatory Visit (INDEPENDENT_AMBULATORY_CARE_PROVIDER_SITE_OTHER): Payer: Medicare PPO | Admitting: General Practice

## 2017-02-18 DIAGNOSIS — Z7901 Long term (current) use of anticoagulants: Secondary | ICD-10-CM | POA: Diagnosis not present

## 2017-02-18 DIAGNOSIS — I4891 Unspecified atrial fibrillation: Secondary | ICD-10-CM

## 2017-02-18 DIAGNOSIS — Z5181 Encounter for therapeutic drug level monitoring: Secondary | ICD-10-CM

## 2017-02-18 LAB — POCT INR: INR: 3.7

## 2017-02-18 NOTE — Patient Instructions (Signed)
Pre visit review using our clinic review tool, if applicable. No additional management support is needed unless otherwise documented below in the visit note. 

## 2017-02-18 NOTE — Progress Notes (Signed)
I have reviewed and agree with note, evaluation, plan.   Tavarious Freel, MD  

## 2017-02-19 ENCOUNTER — Other Ambulatory Visit: Payer: Self-pay

## 2017-02-19 ENCOUNTER — Telehealth: Payer: Self-pay | Admitting: Family Medicine

## 2017-02-19 MED ORDER — DILTIAZEM HCL ER COATED BEADS 240 MG PO CP24: 240.0000 mg | ORAL_CAPSULE | Freq: Every day | ORAL | 0 refills | Status: DC

## 2017-02-19 NOTE — Telephone Encounter (Signed)
Prescription sent to pharmacy as requested.

## 2017-02-19 NOTE — Telephone Encounter (Signed)
Pt need a few pills of diltiazem 240 MG #20 until his arrive from the mail order.   Pharm:  [facility]

## 2017-03-13 ENCOUNTER — Ambulatory Visit (INDEPENDENT_AMBULATORY_CARE_PROVIDER_SITE_OTHER): Payer: Medicare PPO | Admitting: General Practice

## 2017-03-13 DIAGNOSIS — I4891 Unspecified atrial fibrillation: Secondary | ICD-10-CM | POA: Diagnosis not present

## 2017-03-13 DIAGNOSIS — Z7901 Long term (current) use of anticoagulants: Secondary | ICD-10-CM

## 2017-03-13 LAB — POCT INR: INR: 2.5

## 2017-03-13 NOTE — Patient Instructions (Signed)
Pre visit review using our clinic review tool, if applicable. No additional management support is needed unless otherwise documented below in the visit note. 

## 2017-03-13 NOTE — Progress Notes (Signed)
I have reviewed and agree with the plan. 

## 2017-03-18 ENCOUNTER — Ambulatory Visit: Payer: Medicare PPO

## 2017-03-26 ENCOUNTER — Encounter: Payer: Self-pay | Admitting: Cardiology

## 2017-04-08 ENCOUNTER — Other Ambulatory Visit: Payer: Self-pay | Admitting: Family Medicine

## 2017-04-08 ENCOUNTER — Ambulatory Visit: Payer: Medicare PPO | Admitting: General Practice

## 2017-04-08 ENCOUNTER — Ambulatory Visit (INDEPENDENT_AMBULATORY_CARE_PROVIDER_SITE_OTHER): Payer: Medicare PPO | Admitting: *Deleted

## 2017-04-08 DIAGNOSIS — I495 Sick sinus syndrome: Secondary | ICD-10-CM | POA: Diagnosis not present

## 2017-04-08 DIAGNOSIS — Z7901 Long term (current) use of anticoagulants: Secondary | ICD-10-CM | POA: Diagnosis not present

## 2017-04-08 DIAGNOSIS — Z23 Encounter for immunization: Secondary | ICD-10-CM | POA: Diagnosis not present

## 2017-04-08 LAB — POCT INR: INR: 3.4

## 2017-04-08 NOTE — Progress Notes (Signed)
I agree with this plan.

## 2017-04-08 NOTE — Patient Instructions (Signed)
Pre visit review using our clinic review tool, if applicable. No additional management support is needed unless otherwise documented below in the visit note. 

## 2017-04-09 ENCOUNTER — Other Ambulatory Visit: Payer: Self-pay | Admitting: Family Medicine

## 2017-04-09 NOTE — Progress Notes (Signed)
Remote pacemaker transmission.   

## 2017-04-10 ENCOUNTER — Encounter: Payer: Self-pay | Admitting: Cardiology

## 2017-04-10 NOTE — Progress Notes (Signed)
HPI The patient presents for yearly followup of atrial fib. Since last saw him he has done well. He has been seen by Dr. Lovena Le and had normal pacemaker function.  Since I last saw him he has done well.  The patient denies any new symptoms such as chest discomfort, neck or arm discomfort. There has been no new shortness of breath, PND or orthopnea. There have been no reported palpitations, presyncope or syncope.   He does vacuuming and rides a Conservation officer, nature.  He has had no complaints with his activities of daily living.   No Known Allergies  Prior to Admission medications   Medication Sig Start Date End Date Taking? Authorizing Provider  atorvastatin (LIPITOR) 40 MG tablet TAKE 1 TABLET (40 MG TOTAL) BY MOUTH DAILY. 04/09/17  Yes Marin Olp, MD  diltiazem (CARTIA XT) 240 MG 24 hr capsule Take 1 capsule (240 mg total) by mouth daily. 02/19/17  Yes Marin Olp, MD  fluticasone [facility]) 50 MCG/ACT nasal spray USE 2 SPRAYS IN EACH NOSTRIL EVERY DAY 04/09/17  Yes Marin Olp, MD  metoprolol succinate (TOPROL-XL) 50 MG 24 hr tablet TAKE 1 AND 1/2 TABLETS ONE TIME DAILY (NEED MD APPOINTMENT) 04/08/17  Yes Marin Olp, MD  Multiple Vitamins-Minerals (MULTIVITAMIN & MINERAL PO) Take 1 tablet by mouth daily.   Yes [provider]  nitroGLYCERIN (NITROSTAT) 0.4 MG SL tablet Place 0.4 mg under the tongue every 5 (five) minutes x 3 doses as needed for chest pain.   Yes [provider]  omeprazole (PRILOSEC) 20 MG capsule Take 1 capsule (20 mg total) by mouth daily. 07/18/16  Yes Marin Olp, MD  tamsulosin (FLOMAX) 0.4 MG CAPS capsule TAKE 1 CAPSULE EVERY DAY 11/29/16  Yes Marin Olp, MD  warfarin (COUMADIN) 2.5 MG tablet TAKE AS DIRECTED BY THE COUMADIN CLINIC 11/19/16  Yes Marin Olp, MD     Past Medical History:  Diagnosis Date  . Actinic keratosis   . Adenomatous colon polyp   . ALLERGIC RHINITIS   . Arthritis   . CAD, NATIVE VESSEL    a.  DES x2 to RCA 2004 b. repeat PTCA to RCA 2008 c. PCI to RCA 2012 non-DES   . Cellulitis and abscess of trunk   . Diverticulosis   . GERD (gastroesophageal reflux disease)   . HYPERLIPIDEMIA   . HYPERTENSION   . OTH MALIG NEOPLASM SKIN OTH\T\UNSPEC PARTS FACE   . Permanent atrial fibrillation ([place])   . Skin cancer   . Tachy-brady syndrome ([place])    a. s/p MDT dual chamber pacemaker implant complicated by pericardial effusion and micro-perforatation s/p lead revisions    Past Surgical History:  Procedure Laterality Date  . BACK SURGERY  2000   "for bulging discs"  . CATARACT EXTRACTION W/ INTRAOCULAR LENS  IMPLANT, BILATERAL  2012  . CORONARY ANGIOPLASTY WITH STENT PLACEMENT  2008  . CORONARY ANGIOPLASTY WITH STENT PLACEMENT  05/14/11  . CYSTECTOMY     from right side of face.  Marland Kitchen LEAD REVISION  2008   11 days post initial implant, RA and RV lead revisions for micro-perforation and pericardial effusion by Dr Lovena Le  . PACEMAKER INSERTION  2008   MDT dual chamber pacemaker implanted by Dr Percival Spanish    ROS: Positive for right knee pain, frequent bowel movements.  Otherwise as stated in the HPI negative for all systems  PHYSICAL EXAM BP 124/60   Pulse 88   Ht 5\' 7"  (  1.702 m)   Wt 205 lb 9.6 oz (93.3 kg)   BMI 32.20 kg/m   GENERAL:  Well appearing NECK:  No jugular venous distention, waveform within normal limits, carotid upstroke brisk and symmetric, no bruits, no thyromegaly LUNGS:  Clear to auscultation bilaterally CHEST:   Well healed pace pocket.   HEART:  PMI not displaced or sustained,S1 and S2 within normal limits, no S3, no S4, no clicks, no rubs, no murmurs ABD:  Flat, positive bowel sounds normal in frequency in pitch, no bruits, no rebound, no guarding, no midline pulsatile mass, no hepatomegaly, no splenomegaly EXT:  2 plus pulses throughout, bilateral mild leg edema, no cyanosis no clubbing  EKG:  NSR, rate 88, ventricular pacing 100% capture.    Lab Results    Component Value Date   CHOL 97 (L) 04/04/2016   TRIG 153 (H) 04/04/2016   HDL 33 (L) 04/04/2016   LDLCALC 33 04/04/2016   LDLDIRECT 54.0 11/21/2015    ASSESSMENT AND PLAN  ATRIAL FIBRILLATION -    [name] has a CHA2DS2 - VASc score of 3 with a risk of stroke of 3.2%.  He tolerates anticoagulation and has no symptoms related to this.  No change in therapy is indicated.  CAD, NATIVE VESSEL -  He had a stress perfusion study in 2012 demonstrated no high risk findings.  He has no new symptoms.  No further testing is indicated.  He needs risk reduction.  PACEMAKER, PERMANENT -  He is up to date with this.  He has normal pacer function.   DYSLIPIDEMIA - I will check a lipid profile.

## 2017-04-11 ENCOUNTER — Encounter: Payer: Self-pay | Admitting: Cardiology

## 2017-04-11 ENCOUNTER — Ambulatory Visit: Payer: Medicare PPO | Admitting: Cardiology

## 2017-04-11 VITALS — BP 124/60 | HR 88 | Ht 67.0 in | Wt 205.6 lb

## 2017-04-11 DIAGNOSIS — E785 Hyperlipidemia, unspecified: Secondary | ICD-10-CM

## 2017-04-11 DIAGNOSIS — I2510 Atherosclerotic heart disease of native coronary artery without angina pectoris: Secondary | ICD-10-CM | POA: Diagnosis not present

## 2017-04-11 DIAGNOSIS — I4891 Unspecified atrial fibrillation: Secondary | ICD-10-CM

## 2017-04-11 LAB — CUP PACEART REMOTE DEVICE CHECK
Battery Impedance: 444 Ohm
Battery Voltage: 2.78 V
Implantable Lead Location: 753859
Implantable Lead Model: 5076
Implantable Pulse Generator Implant Date: 20160714
Lead Channel Impedance Value: 0 Ohm
Lead Channel Pacing Threshold Amplitude: 0.75 V
Lead Channel Pacing Threshold Pulse Width: 0.4 ms
Lead Channel Setting Sensing Sensitivity: 1.4 mV
MDC IDC LEAD IMPLANT DT: 20081007
MDC IDC LEAD IMPLANT DT: 20081007
MDC IDC LEAD LOCATION: 753860
MDC IDC MSMT BATTERY REMAINING LONGEVITY: 91 mo
MDC IDC MSMT LEADCHNL RV IMPEDANCE VALUE: 511 Ohm
MDC IDC SESS DTM: 20181105192013
MDC IDC SET LEADCHNL RV PACING AMPLITUDE: 2 V
MDC IDC SET LEADCHNL RV PACING PULSEWIDTH: 0.4 ms
MDC IDC STAT BRADY RV PERCENT PACED: 67 %

## 2017-04-11 MED ORDER — NITROGLYCERIN 0.4 MG SL SUBL: 0.4000 mg | SUBLINGUAL_TABLET | SUBLINGUAL | 1 refills | Status: DC | PRN

## 2017-04-11 MED ORDER — NITROGLYCERIN 0.4 MG SL SUBL: 0.4000 mg | SUBLINGUAL_TABLET | SUBLINGUAL | 5 refills | Status: DC | PRN

## 2017-04-11 NOTE — Patient Instructions (Signed)
Medication Instructions:  Continue current medications  If you need a refill on your cardiac medications before your next appointment, please call your pharmacy.  Labwork: Lipid Profile   Testing/Procedures: None Ordered   Follow-Up: Your physician wants you to follow-up in: 1 Year. You should receive a reminder letter in the mail two months in advance. If you do not receive a letter, please call our office [phone].   Thank you for choosing [facility] at [facility]!!

## 2017-04-11 NOTE — Progress Notes (Signed)
No I saw pt in the office.  I was doing training and with the Epic upgrade there is no telling what I did!  I went back and added charges.

## 2017-04-16 LAB — LIPID PANEL
CHOLESTEROL TOTAL: 109 mg/dL (ref 100–199)
Chol/HDL Ratio: 2.8 ratio (ref 0.0–5.0)
HDL: 39 mg/dL — ABNORMAL LOW (ref >39)
LDL Calculated: 48 mg/dL (ref 0–99)
TRIGLYCERIDES: 111 mg/dL (ref 0–149)
VLDL Cholesterol Cal: 22 mg/dL (ref 5–40)

## 2017-05-03 ENCOUNTER — Other Ambulatory Visit: Payer: Self-pay | Admitting: *Deleted

## 2017-05-03 DIAGNOSIS — Z5181 Encounter for therapeutic drug level monitoring: Secondary | ICD-10-CM

## 2017-05-06 ENCOUNTER — Ambulatory Visit: Payer: Medicare PPO

## 2017-05-06 ENCOUNTER — Other Ambulatory Visit: Payer: Medicare PPO

## 2017-05-06 ENCOUNTER — Ambulatory Visit (INDEPENDENT_AMBULATORY_CARE_PROVIDER_SITE_OTHER): Payer: Medicare PPO | Admitting: *Deleted

## 2017-05-06 DIAGNOSIS — I4891 Unspecified atrial fibrillation: Secondary | ICD-10-CM | POA: Diagnosis not present

## 2017-05-06 DIAGNOSIS — Z5181 Encounter for therapeutic drug level monitoring: Secondary | ICD-10-CM

## 2017-05-06 DIAGNOSIS — Z7901 Long term (current) use of anticoagulants: Secondary | ICD-10-CM

## 2017-05-06 LAB — POCT INR: INR: 2.7

## 2017-05-06 NOTE — Patient Instructions (Signed)
Continue to take 1 tablet daily except 2 tablets on Monday, Wed, and Fridays.  Re-check in 4 weeks.

## 2017-05-06 NOTE — Progress Notes (Signed)
I have reviewed and agree with note, evaluation, plan.   Stephen Hunter, MD  

## 2017-05-20 ENCOUNTER — Encounter: Payer: Self-pay | Admitting: *Deleted

## 2017-05-20 ENCOUNTER — Other Ambulatory Visit: Payer: Self-pay | Admitting: *Deleted

## 2017-05-20 NOTE — Patient Outreach (Signed)
[employer] [facility] attempted but no one was able to answer the phone. I did leave a message and requested a return call. I will call again tomorrow.  Eulah Pont. Myrtie Neither, MSN, [facility] Gerontological Nurse Practitioner [facility] Care Management [phone]

## 2017-05-20 NOTE — Patient Outreach (Signed)
Pt returned my call and we completed his [place]. He will see Dr. Yong Channel tomorrow for his AWV. In general he is doing very well. He is still active, working on his farm. He is independent in his ADLs and IADLs. He sees his cardiologist annually. He has all his meds and is taking them. He goes to the cardiology office for a protime once a month. He does have a pacemaker.  Mr. Borquez does not have any [facility] care management needs at this time. I will send him a letter with our information for future reference.  Depression screen [facility] 2/9 05/20/2017 11/21/2015 09/09/2014 06/17/2013 11/19/2012  Decreased Interest 0 0 0 0 0  Down, Depressed, Hopeless 0 0 0 0 0  PHQ - 2 Score 0 0 0 0 0  Some recent data might be hidden   Fall Risk  05/20/2017 11/21/2015 09/09/2014 06/17/2013 11/19/2012  Falls in the past year? No No No No No   Ashara Lounsbury C. Myrtie Neither, MSN, [facility] Gerontological Nurse Practitioner [facility] Care Management [phone]

## 2017-05-21 ENCOUNTER — Ambulatory Visit (INDEPENDENT_AMBULATORY_CARE_PROVIDER_SITE_OTHER): Payer: Medicare PPO | Admitting: Family Medicine

## 2017-05-21 ENCOUNTER — Encounter: Payer: Self-pay | Admitting: Family Medicine

## 2017-05-21 ENCOUNTER — Ambulatory Visit: Payer: Self-pay | Admitting: *Deleted

## 2017-05-21 VITALS — BP 118/56 | HR 85 | Temp 98.1°F | Ht 67.0 in | Wt 203.2 lb

## 2017-05-21 DIAGNOSIS — E785 Hyperlipidemia, unspecified: Secondary | ICD-10-CM

## 2017-05-21 DIAGNOSIS — N401 Enlarged prostate with lower urinary tract symptoms: Secondary | ICD-10-CM | POA: Diagnosis not present

## 2017-05-21 DIAGNOSIS — K219 Gastro-esophageal reflux disease without esophagitis: Secondary | ICD-10-CM

## 2017-05-21 DIAGNOSIS — R3589 Other polyuria: Secondary | ICD-10-CM

## 2017-05-21 DIAGNOSIS — I10 Essential (primary) hypertension: Secondary | ICD-10-CM | POA: Diagnosis not present

## 2017-05-21 DIAGNOSIS — I4891 Unspecified atrial fibrillation: Secondary | ICD-10-CM | POA: Diagnosis not present

## 2017-05-21 DIAGNOSIS — I2510 Atherosclerotic heart disease of native coronary artery without angina pectoris: Secondary | ICD-10-CM | POA: Diagnosis not present

## 2017-05-21 DIAGNOSIS — R351 Nocturia: Secondary | ICD-10-CM

## 2017-05-21 DIAGNOSIS — R358 Other polyuria: Secondary | ICD-10-CM

## 2017-05-21 MED ORDER — TAMSULOSIN HCL 0.4 MG PO CAPS: 0.8000 mg | ORAL_CAPSULE | Freq: Every day | ORAL | 3 refills | Status: DC

## 2017-05-21 NOTE — Progress Notes (Addendum)
Subjective:  Shawn [name] is a 82 y.o. year old very pleasant male patient who presents for/with See problem oriented charting ROS- no chest pain or shortness of breath. No edema. Does not have palpitations with his atrial fibrillation. Denies indigestion.    Past Medical History-  Patient Active Problem List   Diagnosis Date Noted  . CAD, NATIVE VESSEL 09/27/2008    Priority: High  . PACEMAKER, PERMANENT 03/11/2007    Priority: High  . Atrial fibrillation ([place]) 02/11/2007    Priority: High  . History of adenomatous polyp of colon 09/29/2014    Priority: Medium  . BPH associated with nocturia 09/09/2014    Priority: Medium  . Hyperlipidemia 03/10/2008    Priority: Medium  . Essential hypertension 12/02/2006    Priority: Medium  . Encounter for therapeutic drug monitoring 09/24/2013    Priority: Low  . Thrombocytopenia ([place]) 06/17/2011    Priority: Low  . Hx of skin cancer, basal cell 04/07/2008    Priority: Low  . Actinic keratosis 04/07/2008    Priority: Low  . Allergic rhinitis 12/02/2006    Priority: Low  . GERD 12/02/2006    Priority: Low  . Long term (current) use of anticoagulants 02/18/2017    Medications- reviewed and updated Current Outpatient Medications  Medication Sig Dispense Refill  . atorvastatin (LIPITOR) 40 MG tablet TAKE 1 TABLET (40 MG TOTAL) BY MOUTH DAILY. 90 tablet 0  . diltiazem (CARTIA XT) 240 MG 24 hr capsule Take 1 capsule (240 mg total) by mouth daily. 20 capsule 0  . fluticasone (FLONASE) 50 MCG/ACT nasal spray USE 2 SPRAYS IN EACH NOSTRIL EVERY DAY 48 g 0  . metoprolol succinate (TOPROL-XL) 50 MG 24 hr tablet TAKE 1 AND 1/2 TABLETS ONE TIME DAILY (NEED MD APPOINTMENT) 135 tablet 0  . Multiple Vitamins-Minerals (MULTIVITAMIN & MINERAL PO) Take 1 tablet by mouth daily.    . nitroGLYCERIN (NITROSTAT) 0.4 MG SL tablet Place 1 tablet (0.4 mg total) every 5 (five) minutes as needed under the tongue for chest pain. 25 tablet 1  . omeprazole  (PRILOSEC) 20 MG capsule Take 1 capsule (20 mg total) by mouth daily. 90 capsule 3  . tamsulosin (FLOMAX) 0.4 MG CAPS capsule TAKE 1 CAPSULE EVERY DAY 90 capsule 1  . warfarin (COUMADIN) 2.5 MG tablet TAKE AS DIRECTED BY THE COUMADIN CLINIC 135 tablet 1   No current facility-administered medications for this visit.     Objective: BP (!) 118/56 (BP Location: Left Arm, Patient Position: Sitting, Cuff Size: Large)   Pulse 85   Temp 98.1 F (36.7 C) (Oral)   Ht 5\' 7"  (1.702 m)   Wt 203 lb 3.2 oz (92.2 kg)   SpO2 96%   BMI 31.83 kg/m  Gen: NAD, resting comfortably CV: irregularly irregular no murmurs rubs or gallops Lungs: CTAB no crackles, wheeze, rhonchi Abdomen: soft/nontender/nondistended/normal bowel sounds.obese Ext: no edema Skin: warm, dry Neuro: grossly normal, moves all extremities Rectal: normal tone, diffusely enlarged prostate, no masses or tenderness  Assessment/Plan:  BPH associated with nocturia S: Patient starting to have more nocturia- sometimes up hourly. Compliant with 0.4mg  flomax Lab Results  Component Value Date   PSA 2.20 05/17/2014   PSA 3.74 06/17/2013  A/P: Get UA and culture for nocturia, also increase flomax. Rectal exam BPH only- discussed screening for prostate cancer with PSA- at his age we jointly decided against this for now.   Atrial fibrillation ([place]) S: patient compliant with cardiology follow up.  Appears to  be in a fib- has pacemaker He is compliant with his diltiazem, metoprolol, and coumadin through cardiology clinic A/P: continue current medicines   Hyperlipidemia S: well controlled on atorvastatin 40mg . No myalgias.  Lab Results  Component Value Date   CHOL 109 04/16/2017   HDL 39 (L) 04/16/2017   LDLCALC 48 04/16/2017   LDLDIRECT 54.0 11/21/2015   TRIG 111 04/16/2017   CHOLHDL 2.8 04/16/2017   A/P: LDL now at goal under 70. Continue current medicines.    CAD, NATIVE VESSEL S: compliant with aspirin, atorvastatin 40mg , and  metoprolol. Asymptomatic- no chest pain. Not using nitroglycerin. History 4 stents in last 15 years.  A/P: continue current medicines and follow up.   GERD Doing well on omeprazole 20mg - encouraged him to trial zantac 150mg  BID   Future Appointments  Date Time Provider [place]  06/05/2017  9:45 AM [facility] COUMADIN CLINIC [facility]  06/06/2017 11:00 AM Williemae Area, RN [facility]  07/08/2017  1:00 PM [facility] DEVICE REMOTES [facility]  advised awv and follow up with me - he appears to have only scheduled AWV- hopefully he can at least pass along how he is doing with the flomax 0.8mg   Orders Placed This Encounter  Procedures  . Urine Culture    [facility]  . Urinalysis    Standing Status:   Future    Number of Occurrences:   1    Standing Expiration Date:   05/21/2018  . CBC    [place]  . Comprehensive metabolic panel    [place]   Meds ordered this encounter  Medications  . tamsulosin (FLOMAX) 0.4 MG CAPS capsule    Sig: Take 2 capsules (0.8 mg total) by mouth daily.    Dispense:  180 capsule    Refill:  3   Return precautions advised.  Garret Reddish, MD

## 2017-05-21 NOTE — Assessment & Plan Note (Signed)
Doing well on omeprazole 20mg - encouraged him to trial zantac 150mg  BID

## 2017-05-21 NOTE — Assessment & Plan Note (Signed)
S: well controlled on atorvastatin 40mg . No myalgias.  Lab Results  Component Value Date   CHOL 109 04/16/2017   HDL 39 (L) 04/16/2017   LDLCALC 48 04/16/2017   LDLDIRECT 54.0 11/21/2015   TRIG 111 04/16/2017   CHOLHDL 2.8 04/16/2017   A/P: LDL now at goal under 70. Continue current medicines.

## 2017-05-21 NOTE — Assessment & Plan Note (Signed)
S: Patient starting to have more nocturia- sometimes up hourly. Compliant with 0.4mg  flomax Lab Results  Component Value Date   PSA 2.20 05/17/2014   PSA 3.74 06/17/2013  A/P: Get UA and culture for nocturia, also increase flomax. Rectal exam BPH only- discussed screening for prostate cancer with PSA- at his age we jointly decided against this for now.

## 2017-05-21 NOTE — Patient Instructions (Addendum)
Trial flomax 0.8mg  (two of the 0.4mg  pills). Follow up 1 month to check back in and see how you are doing with this.   If doing well with higher flomax dose, could also trial zantac 150mg  twice a day before meals instead of the omeprazole as likely safer long term  I would also like for you to sign up for an annual wellness visit with our nurse, Cassie, who specializes in the annual wellness exam. This is a free benefit under medicare that may help [place] find additional ways to help you. Some highlights are reviewing medications, lifestyle, and doing a dementia screen.   Please stop by lab before you go

## 2017-05-21 NOTE — Assessment & Plan Note (Signed)
S: patient compliant with cardiology follow up.  Appears to be in a fib- has pacemaker He is compliant with his diltiazem, metoprolol, and coumadin through cardiology clinic A/P: continue current medicines

## 2017-05-21 NOTE — Assessment & Plan Note (Signed)
S: compliant with aspirin, atorvastatin 40mg , and metoprolol. Asymptomatic- no chest pain. Not using nitroglycerin. History 4 stents in last 15 years.  A/P: continue current medicines and follow up.

## 2017-05-22 LAB — URINALYSIS
BILIRUBIN URINE: NEGATIVE
KETONES UR: NEGATIVE
LEUKOCYTES UA: NEGATIVE
Nitrite: NEGATIVE
PH: 6 (ref 5.0–8.0)
SPECIFIC GRAVITY, URINE: 1.015 (ref 1.000–1.030)
TOTAL PROTEIN, URINE-UPE24: NEGATIVE
Urine Glucose: NEGATIVE
Urobilinogen, UA: 0.2 (ref 0.0–1.0)

## 2017-05-22 LAB — COMPREHENSIVE METABOLIC PANEL
ALK PHOS: 63 U/L (ref 39–117)
ALT: 24 U/L (ref 0–53)
AST: 19 U/L (ref 0–37)
Albumin: 4.3 g/dL (ref 3.5–5.2)
BILIRUBIN TOTAL: 0.6 mg/dL (ref 0.2–1.2)
BUN: 15 mg/dL (ref 6–23)
CO2: 31 meq/L (ref 19–32)
Calcium: 9.8 mg/dL (ref 8.4–10.5)
Chloride: 103 mEq/L (ref 96–112)
Creatinine, Ser: 1.25 mg/dL (ref 0.40–1.50)
GFR: 59.02 mL/min — AB (ref >60.00)
GLUCOSE: 99 mg/dL (ref 70–99)
Potassium: 4.6 mEq/L (ref 3.5–5.1)
SODIUM: 140 meq/L (ref 135–145)
TOTAL PROTEIN: 7.6 g/dL (ref 6.0–8.3)

## 2017-05-22 LAB — CBC
HCT: 46.9 % (ref 39.0–52.0)
HEMOGLOBIN: 15.8 g/dL (ref 13.0–17.0)
MCHC: 33.7 g/dL (ref 30.0–36.0)
MCV: 94 fl (ref 78.0–100.0)
Platelets: 185 10*3/uL (ref 150.0–400.0)
RBC: 4.99 Mil/uL (ref 4.22–5.81)
RDW: 13.7 % (ref 11.5–15.5)
WBC: 9 10*3/uL (ref 4.0–10.5)

## 2017-05-22 LAB — URINE CULTURE
MICRO NUMBER:: 81421827
RESULT: NO GROWTH
SPECIMEN QUALITY:: ADEQUATE

## 2017-05-23 ENCOUNTER — Other Ambulatory Visit: Payer: Self-pay | Admitting: *Deleted

## 2017-05-23 DIAGNOSIS — R319 Hematuria, unspecified: Secondary | ICD-10-CM

## 2017-06-05 ENCOUNTER — Ambulatory Visit (INDEPENDENT_AMBULATORY_CARE_PROVIDER_SITE_OTHER): Payer: Medicare PPO | Admitting: General Practice

## 2017-06-05 ENCOUNTER — Other Ambulatory Visit: Payer: Self-pay | Admitting: General Practice

## 2017-06-05 ENCOUNTER — Other Ambulatory Visit (INDEPENDENT_AMBULATORY_CARE_PROVIDER_SITE_OTHER): Payer: Medicare PPO

## 2017-06-05 DIAGNOSIS — I4891 Unspecified atrial fibrillation: Secondary | ICD-10-CM

## 2017-06-05 DIAGNOSIS — Z7901 Long term (current) use of anticoagulants: Secondary | ICD-10-CM

## 2017-06-05 DIAGNOSIS — R319 Hematuria, unspecified: Secondary | ICD-10-CM | POA: Diagnosis not present

## 2017-06-05 LAB — URINALYSIS, MICROSCOPIC ONLY

## 2017-06-05 LAB — POCT INR: INR: 2.5

## 2017-06-05 MED ORDER — WARFARIN SODIUM 2.5 MG PO TABS: ORAL_TABLET | ORAL | 1 refills | Status: DC

## 2017-06-05 NOTE — Patient Instructions (Addendum)
.  lbpcmh  Continue to take 1 tablet daily except 2 tablets on Monday, Wed, and Fridays.  Re-check in 4 weeks.

## 2017-06-05 NOTE — Progress Notes (Signed)
I have reviewed and agree with note, evaluation, plan.   Idalee Foxworthy, MD  

## 2017-06-06 ENCOUNTER — Ambulatory Visit: Payer: Medicare PPO | Admitting: *Deleted

## 2017-06-10 ENCOUNTER — Other Ambulatory Visit: Payer: Self-pay | Admitting: Family Medicine

## 2017-06-12 ENCOUNTER — Other Ambulatory Visit: Payer: Self-pay | Admitting: Family Medicine

## 2017-06-19 ENCOUNTER — Telehealth: Payer: Self-pay | Admitting: Family Medicine

## 2017-06-19 NOTE — Telephone Encounter (Signed)
Patient called asking for an update on his refill of warfarin (coumadin) 2.5 MG tablet. He wants to know if it has called in, as it was supposed to arrive by [place] mail order. Please call the patient to advise.

## 2017-06-20 ENCOUNTER — Other Ambulatory Visit: Payer: Self-pay

## 2017-06-20 MED ORDER — WARFARIN SODIUM 2.5 MG PO TABS: ORAL_TABLET | ORAL | 0 refills | Status: DC

## 2017-06-20 NOTE — Telephone Encounter (Signed)
Called and spoke with patient. Prescription was sent to [facility] on 06/05/2017. I resent prescription (20 tabs) to [place] as patient only has 1 week of medication left. I sent prescription into [facility] as requested.

## 2017-07-03 ENCOUNTER — Ambulatory Visit: Payer: Medicare HMO | Admitting: General Practice

## 2017-07-03 DIAGNOSIS — Z7901 Long term (current) use of anticoagulants: Secondary | ICD-10-CM

## 2017-07-03 DIAGNOSIS — I4891 Unspecified atrial fibrillation: Secondary | ICD-10-CM

## 2017-07-03 LAB — POCT INR: INR: 2.5

## 2017-07-03 NOTE — Patient Instructions (Addendum)
Continue to take 1 tablet daily except 2 tablets on Monday, Wed, and Fridays.  Re-check in 6

## 2017-07-08 ENCOUNTER — Ambulatory Visit (INDEPENDENT_AMBULATORY_CARE_PROVIDER_SITE_OTHER): Payer: Medicare HMO | Admitting: *Deleted

## 2017-07-08 DIAGNOSIS — I495 Sick sinus syndrome: Secondary | ICD-10-CM

## 2017-07-09 ENCOUNTER — Encounter: Payer: Self-pay | Admitting: Cardiology

## 2017-07-09 NOTE — Progress Notes (Signed)
Remote pacemaker transmission.   

## 2017-07-28 LAB — CUP PACEART REMOTE DEVICE CHECK
Battery Remaining Longevity: 87 mo
Battery Voltage: 2.78 V
Date Time Interrogation Session: 20190204181058
Implantable Lead Implant Date: 20081007
Implantable Lead Location: 753859
Implantable Lead Location: 753860
Implantable Lead Model: 5076
Implantable Pulse Generator Implant Date: 20160714
Lead Channel Impedance Value: 499 Ohm
Lead Channel Pacing Threshold Amplitude: 0.75 V
Lead Channel Pacing Threshold Pulse Width: 0.4 ms
MDC IDC LEAD IMPLANT DT: 20081007
MDC IDC MSMT BATTERY IMPEDANCE: 493 Ohm
MDC IDC MSMT LEADCHNL RA IMPEDANCE VALUE: 0 Ohm
MDC IDC SET LEADCHNL RV PACING AMPLITUDE: 2 V
MDC IDC SET LEADCHNL RV PACING PULSEWIDTH: 0.4 ms
MDC IDC SET LEADCHNL RV SENSING SENSITIVITY: 1.4 mV
MDC IDC STAT BRADY RV PERCENT PACED: 68 %

## 2017-08-14 ENCOUNTER — Ambulatory Visit (INDEPENDENT_AMBULATORY_CARE_PROVIDER_SITE_OTHER): Payer: Medicare HMO | Admitting: General Practice

## 2017-08-14 DIAGNOSIS — Z7901 Long term (current) use of anticoagulants: Secondary | ICD-10-CM

## 2017-08-14 DIAGNOSIS — I4891 Unspecified atrial fibrillation: Secondary | ICD-10-CM

## 2017-08-14 LAB — POCT INR: INR: 2.1

## 2017-08-14 NOTE — Progress Notes (Signed)
I have reviewed this visit and I agree on the patient's plan of dosage and recommendations. Jacquie Lukes, DO   

## 2017-08-14 NOTE — Patient Instructions (Addendum)
Pre visit review using our clinic review tool, if applicable. No additional management support is needed unless otherwise documented below in the visit note.  Continue to take 1 tablet daily except 2 tablets on Monday, Wed, and Fridays.  Re-check in 6 weeks.  

## 2017-09-02 ENCOUNTER — Other Ambulatory Visit: Payer: Self-pay | Admitting: Family Medicine

## 2017-09-03 ENCOUNTER — Other Ambulatory Visit: Payer: Self-pay | Admitting: General Practice

## 2017-09-03 MED ORDER — WARFARIN SODIUM 2.5 MG PO TABS: ORAL_TABLET | ORAL | 1 refills | Status: DC

## 2017-09-25 ENCOUNTER — Ambulatory Visit (INDEPENDENT_AMBULATORY_CARE_PROVIDER_SITE_OTHER): Payer: Medicare HMO | Admitting: General Practice

## 2017-09-25 DIAGNOSIS — I4891 Unspecified atrial fibrillation: Secondary | ICD-10-CM | POA: Diagnosis not present

## 2017-09-25 DIAGNOSIS — Z7901 Long term (current) use of anticoagulants: Secondary | ICD-10-CM | POA: Diagnosis not present

## 2017-09-25 LAB — POCT INR: INR: 2.8

## 2017-09-25 NOTE — Patient Instructions (Addendum)
Pre visit review using our clinic review tool, if applicable. No additional management support is needed unless otherwise documented below in the visit note.  Continue to take 1 tablet daily except 2 tablets on Monday, Wed, and Fridays.  Re-check in 6 weeks.  

## 2017-09-25 NOTE — Progress Notes (Signed)
I have reviewed and agree with note, evaluation, plan.   Cristal Howatt, MD  

## 2017-09-27 ENCOUNTER — Ambulatory Visit: Payer: Medicare HMO | Admitting: Family Medicine

## 2017-10-07 ENCOUNTER — Ambulatory Visit (INDEPENDENT_AMBULATORY_CARE_PROVIDER_SITE_OTHER): Payer: Medicare HMO | Admitting: *Deleted

## 2017-10-07 DIAGNOSIS — I495 Sick sinus syndrome: Secondary | ICD-10-CM

## 2017-10-08 NOTE — Progress Notes (Signed)
Remote pacemaker transmission.   

## 2017-10-09 ENCOUNTER — Encounter: Payer: Self-pay | Admitting: Cardiology

## 2017-10-25 LAB — CUP PACEART REMOTE DEVICE CHECK
Battery Remaining Longevity: 84 mo
Battery Voltage: 2.78 V
Brady Statistic RV Percent Paced: 69 %
Implantable Lead Implant Date: 20081007
Implantable Lead Location: 753860
Implantable Lead Model: 5076
Implantable Lead Model: 5076
Lead Channel Pacing Threshold Amplitude: 0.625 V
Lead Channel Setting Pacing Pulse Width: 0.4 ms
Lead Channel Setting Sensing Sensitivity: 1.4 mV
MDC IDC LEAD IMPLANT DT: 20081007
MDC IDC LEAD LOCATION: 753859
MDC IDC MSMT BATTERY IMPEDANCE: 542 Ohm
MDC IDC MSMT LEADCHNL RA IMPEDANCE VALUE: 0 Ohm
MDC IDC MSMT LEADCHNL RV IMPEDANCE VALUE: 527 Ohm
MDC IDC MSMT LEADCHNL RV PACING THRESHOLD PULSEWIDTH: 0.4 ms
MDC IDC PG IMPLANT DT: 20160714
MDC IDC SESS DTM: 20190506131405
MDC IDC SET LEADCHNL RV PACING AMPLITUDE: 2 V

## 2017-10-29 ENCOUNTER — Other Ambulatory Visit: Payer: Self-pay | Admitting: Family Medicine

## 2017-11-06 ENCOUNTER — Ambulatory Visit (INDEPENDENT_AMBULATORY_CARE_PROVIDER_SITE_OTHER): Payer: Medicare HMO | Admitting: General Practice

## 2017-11-06 DIAGNOSIS — Z7901 Long term (current) use of anticoagulants: Secondary | ICD-10-CM | POA: Diagnosis not present

## 2017-11-06 DIAGNOSIS — I4891 Unspecified atrial fibrillation: Secondary | ICD-10-CM

## 2017-11-06 LAB — POCT INR: INR: 3.3 — AB (ref 2.0–3.0)

## 2017-11-06 NOTE — Patient Instructions (Addendum)
Pre visit review using our clinic review tool, if applicable. No additional management support is needed unless otherwise documented below in the visit note.  Continue to take 1 tablet daily except 2 tablets on Monday, Wed, and Fridays.  Re-check in 6 weeks.  

## 2017-11-07 NOTE — Progress Notes (Signed)
I have reviewed and agree with note, evaluation, plan.   Orel Cooler, MD  

## 2017-11-18 ENCOUNTER — Other Ambulatory Visit: Payer: Self-pay | Admitting: Family Medicine

## 2017-11-18 NOTE — Telephone Encounter (Signed)
[facility]ia is diltiazem- you can refill. Thanks

## 2017-12-18 ENCOUNTER — Ambulatory Visit (INDEPENDENT_AMBULATORY_CARE_PROVIDER_SITE_OTHER): Payer: Medicare HMO | Admitting: General Practice

## 2017-12-18 DIAGNOSIS — Z7901 Long term (current) use of anticoagulants: Secondary | ICD-10-CM | POA: Diagnosis not present

## 2017-12-18 DIAGNOSIS — I4891 Unspecified atrial fibrillation: Secondary | ICD-10-CM

## 2017-12-18 LAB — POCT INR: INR: 2.7 (ref 2.0–3.0)

## 2017-12-18 NOTE — Progress Notes (Signed)
I have reviewed and agree with note, evaluation, plan.   Dioselina Brumbaugh, MD  

## 2017-12-18 NOTE — Patient Instructions (Addendum)
Pre visit review using our clinic review tool, if applicable. No additional management support is needed unless otherwise documented below in the visit note.  Continue to take 1 tablet daily except 2 tablets on Monday, Wed, and Fridays.  Re-check in 6 weeks.

## 2017-12-19 ENCOUNTER — Encounter: Payer: Self-pay | Admitting: Family Medicine

## 2017-12-19 ENCOUNTER — Ambulatory Visit (INDEPENDENT_AMBULATORY_CARE_PROVIDER_SITE_OTHER): Payer: Medicare HMO | Admitting: Family Medicine

## 2017-12-19 VITALS — BP 130/66 | HR 75 | Temp 97.9°F | Ht 67.0 in | Wt 206.0 lb

## 2017-12-19 DIAGNOSIS — M1711 Unilateral primary osteoarthritis, right knee: Secondary | ICD-10-CM | POA: Insufficient documentation

## 2017-12-19 DIAGNOSIS — N401 Enlarged prostate with lower urinary tract symptoms: Secondary | ICD-10-CM

## 2017-12-19 DIAGNOSIS — I10 Essential (primary) hypertension: Secondary | ICD-10-CM

## 2017-12-19 DIAGNOSIS — I4891 Unspecified atrial fibrillation: Secondary | ICD-10-CM

## 2017-12-19 DIAGNOSIS — R351 Nocturia: Secondary | ICD-10-CM

## 2017-12-19 LAB — BASIC METABOLIC PANEL
BUN: 12 mg/dL (ref 6–23)
CALCIUM: 9.3 mg/dL (ref 8.4–10.5)
CO2: 31 mEq/L (ref 19–32)
Chloride: 105 mEq/L (ref 96–112)
Creatinine, Ser: 1.13 mg/dL (ref 0.40–1.50)
GFR: 66.21 mL/min (ref >60.00)
GLUCOSE: 92 mg/dL (ref 70–99)
POTASSIUM: 4.2 meq/L (ref 3.5–5.1)
SODIUM: 142 meq/L (ref 135–145)

## 2017-12-19 MED ORDER — METHYLPREDNISOLONE ACETATE 80 MG/ML IJ SUSP
80.0000 mg | Freq: Once | INTRAMUSCULAR | Status: AC
  Administered 2017-12-19: 80 mg via INTRA_ARTICULAR

## 2017-12-19 NOTE — Patient Instructions (Addendum)
Please stop by lab before you go to check on kidneys  POSSIBLE STEROID SIDE EFFECTS:  Side effects from injectable steroids tend to be less than when taken orally however you may experience some of the symptoms listed below.  If experienced these should only last for a short period of time. Change in menstrual flow  Edema (swelling)  Increased appetite Skin flushing (redness)  Skin rash/acne  Thrush (oral) Yeast vaginitis    Increased sweating  Depression Increased blood glucose levels Cramping and leg/calf  Euphoria (feeling happy)  POSSIBLE PROCEDURE SIDE EFFECTS: The side effects of the injection are usually fairly minimal however if you may experience some of the following side effects that are usually self-limited and will is off on their own.  If you are concerned please feel free to call the office with questions:  Increased numbness or tingling  Nausea or vomiting  Swelling or bruising at the injection site   Please call our office if if you experience any of the following symptoms over the next week as these can be signs of infection:   Fever greater than 100.20F  Significant swelling at the injection site  Significant redness or drainage from the injection site  If after 2 weeks you are continuing to have worsening symptoms please call our office to discuss what the next appropriate actions should be including the potential for a return office visit or other diagnostic testing.

## 2017-12-19 NOTE — Assessment & Plan Note (Signed)
S: symptoms are better on 2 pills a day of flomax 0.4mg . Stream is better and less frequent nocturia- about twice a night  A/P: continue flomax 0.8mg  daily

## 2017-12-19 NOTE — Assessment & Plan Note (Signed)
S: patient with pacemaker in place and follows with Dr. Lovena Le. He is on diltiazem 240mg  XR and metoprolol 75mg  XR. He is on coumadin for anticoagulation A/P: continue current rx

## 2017-12-19 NOTE — Progress Notes (Signed)
Subjective:  Shawn [name] is a 83 y.o. year old very pleasant male patient who presents for/with See problem oriented charting ROS- patient complains of loose stools, right knee pain. No reported chest pain or increase in baseline shortness of breath.    Past Medical History-  Patient Active Problem List   Diagnosis Date Noted  . CAD, NATIVE VESSEL 09/27/2008    Priority: High  . PACEMAKER, PERMANENT 03/11/2007    Priority: High  . Atrial fibrillation ([place]) 02/11/2007    Priority: High  . History of adenomatous polyp of colon 09/29/2014    Priority: Medium  . BPH associated with nocturia 09/09/2014    Priority: Medium  . Hyperlipidemia 03/10/2008    Priority: Medium  . Essential hypertension 12/02/2006    Priority: Medium  . Encounter for therapeutic drug monitoring 09/24/2013    Priority: Low  . History of thrombocytopenia 06/17/2011    Priority: Low  . Hx of skin cancer, basal cell 04/07/2008    Priority: Low  . Actinic keratosis 04/07/2008    Priority: Low  . Allergic rhinitis 12/02/2006    Priority: Low  . GERD 12/02/2006    Priority: Low  . Osteoarthritis of right knee 12/19/2017  . Long term (current) use of anticoagulants 02/18/2017    Medications- reviewed and updated Current Outpatient Medications  Medication Sig Dispense Refill  . atorvastatin (LIPITOR) 40 MG tablet Take 1 tablet (40 mg total) by mouth daily at 6 PM. 90 tablet 2  . CARTIA XT 240 MG 24 hr capsule TAKE 1 CAPSULE EVERY DAY 90 capsule 3  . fluticasone (FLONASE) 50 MCG/ACT nasal spray Use 2 sprays in each nostril every day 48 g 2  . metoprolol succinate (TOPROL-XL) 50 MG 24 hr tablet TAKE 1 AND 1/2 TABLETS ONE TIME DAILY 135 tablet 1  . Multiple Vitamins-Minerals (MULTIVITAMIN & MINERAL PO) Take 1 tablet by mouth daily.    . nitroGLYCERIN (NITROSTAT) 0.4 MG SL tablet Place 1 tablet (0.4 mg total) every 5 (five) minutes as needed under the tongue for chest pain. 25 tablet 1  . omeprazole  (PRILOSEC) 20 MG capsule Take 1 capsule (20 mg total) by mouth daily. 90 capsule 3  . tamsulosin (FLOMAX) 0.4 MG CAPS capsule Take 2 capsules (0.8 mg total) by mouth daily. 180 capsule 3  . warfarin (COUMADIN) 2.5 MG tablet Take 1 tablet daily except take 2 tablets on Mon Wed and Fri or TAKE AS DIRECTED BY THE COUMADIN CLINIC 135 tablet 1   No current facility-administered medications for this visit.     Objective: BP 130/66 (BP Location: Left Arm, Patient Position: Sitting, Cuff Size: Large)   Pulse 75   Temp 97.9 F (36.6 C) (Oral)   Ht 5\' 7"  (1.702 m)   Wt 206 lb (93.4 kg)   SpO2 96%   BMI 32.26 kg/m  Gen: NAD, resting comfortably CV: regular rate, no murmurs rubs or gallops Lungs: CTAB no crackles, wheeze, rhonchi Abdomen: soft/nontender/nondistended/normal bowel sounds. Ext: no edema Skin: warm, dry MSK: effusion and medial joint line tenderness noted on right knee. Ligaments table  Procedure note Verbal consent obtained and verified for right Knee injection Sterile betadine prep. Furthur cleansed with alcohol. Topical analgesic spray: Ethyl chloride. Joint: right knee Approached in typical fashion with: anteromedial approach Completed without difficulty Meds: 43cc lidocaine without epinephrine and 1 cc of depo medrol 80 mg/cc Needle:1 1/2 inch 25 gauge needle Aftercare instructions and Red flags advised.  Assessment/Plan:  Bowel movement urgency/loose stools  S:  gets sudden bowel movements after eating and can be loose. At least 6 -12 months ago started. Getting worse. Gets some abdominal pain- rolaids help. Pain gets better with bowel movement. Keeping him at home more than he wants to. Weight up 3 lbs- states stomach sometimes better with eating. Remains on prilosec. Going about twice a day if not 3x a day. No antibiotics in last year.   Colonoscopy 2017 with 5 year repeat due to adenoma planned.  A/P: no clear medication cause. Up to date colonoscopy. We discussed  trial of probiotics through activity to start and align if not is not helpful- he agrees to pursue  Essential hypertension S: controlled on Diltiazem 240mg  LA, metoprolol 75mg  XL BP Readings from Last 3 Encounters:  12/19/17 130/66  05/21/17 (!) 118/56  04/11/17 124/60  A/P: We discussed blood pressure goal of <140/90. Continue current meds  Atrial fibrillation [facility]) S: patient with pacemaker in place and follows with Dr. Lovena Le. He is on diltiazem 240mg  XR and metoprolol 75mg  XR. He is on coumadin for anticoagulation A/P: continue current rx   BPH associated with nocturia S: symptoms are better on 2 pills a day of flomax 0.4mg . Stream is better and less frequent nocturia- about twice a night  A/P: continue flomax 0.8mg  daily  Osteoarthritis of right knee S: patient with years of knee pain. Had x-ray years ago and he states was told has arthritis- x-ray from 2015 appears normal. Since then he has continued to have pain and up to 5-6/10 with any walking. Really bothering him. He asks about injection options. He knows he should avoid nsaids with his heart orally.  A/P: injection provided today- hopeful he gets some relief. If not can refer to Dr. Paulla Fore for possible ultrasound guided repeat or possible options like synvisc etc. Patient asks about flexogenics and I told him I would prefer for him to see one of our [place] sports medicine specialists instead due to my confidence in their excellent care. After injection he stated he had at least 20% improvement in his pain   Future Appointments  Date Time Provider [place]  01/06/2018 12:40 PM [facility] DEVICE REMOTES [facility]  01/22/2018  9:00 AM [facility]ADIN [facility]  02/06/2018  9:00 AM [facility] HEALTH COACH [facility]   Lab/Order associations: Essential hypertension - Plan: Basic metabolic panel  Primary osteoarthritis of right knee - Plan: methylPREDNISolone acetate (DEPO-MEDROL) injection 80  mg  Atrial fibrillation, unspecified type ([place])  BPH associated with nocturia  Meds ordered this encounter  Medications  . methylPREDNISolone acetate (DEPO-MEDROL) injection 80 mg    Return precautions advised.  Garret Reddish, MD

## 2017-12-19 NOTE — Assessment & Plan Note (Signed)
S: controlled on Diltiazem 240mg  LA, metoprolol 75mg  XL BP Readings from Last 3 Encounters:  12/19/17 130/66  05/21/17 (!) 118/56  04/11/17 124/60  A/P: We discussed blood pressure goal of <140/90. Continue current meds

## 2017-12-19 NOTE — Assessment & Plan Note (Addendum)
S: patient with years of knee pain. Had x-ray years ago and he states was told has arthritis- x-ray from 2015 appears normal. Since then he has continued to have pain and up to 5-6/10 with any walking. Really bothering him. He asks about injection options. He knows he should avoid nsaids with his heart orally.  A/P: injection provided today- hopeful he gets some relief. If not can refer to Dr. Paulla Fore for possible ultrasound guided repeat or possible options like synvisc etc. Patient asks about flexogenics and I told him I would prefer for him to see one of our [place] sports medicine specialists instead due to my confidence in their excellent care. After injection he stated he had at least 20% improvement in his pain

## 2018-01-06 ENCOUNTER — Encounter: Payer: Medicare HMO | Admitting: *Deleted

## 2018-01-08 ENCOUNTER — Telehealth: Payer: Self-pay

## 2018-01-08 NOTE — Telephone Encounter (Signed)
Spoke with pt and reminded pt of remote transmission that is due today. Pt verbalized understanding.   

## 2018-01-14 ENCOUNTER — Encounter: Payer: Self-pay | Admitting: Cardiology

## 2018-01-14 ENCOUNTER — Other Ambulatory Visit: Payer: Self-pay | Admitting: Family Medicine

## 2018-01-20 ENCOUNTER — Ambulatory Visit (INDEPENDENT_AMBULATORY_CARE_PROVIDER_SITE_OTHER): Payer: Medicare HMO | Admitting: *Deleted

## 2018-01-20 DIAGNOSIS — I495 Sick sinus syndrome: Secondary | ICD-10-CM | POA: Diagnosis not present

## 2018-01-21 NOTE — Progress Notes (Signed)
Remote pacemaker transmission.   

## 2018-01-22 ENCOUNTER — Ambulatory Visit (INDEPENDENT_AMBULATORY_CARE_PROVIDER_SITE_OTHER): Payer: Medicare HMO | Admitting: General Practice

## 2018-01-22 DIAGNOSIS — I4891 Unspecified atrial fibrillation: Secondary | ICD-10-CM | POA: Diagnosis not present

## 2018-01-22 DIAGNOSIS — Z7901 Long term (current) use of anticoagulants: Secondary | ICD-10-CM

## 2018-01-22 LAB — POCT INR: INR: 3.2 — AB (ref 2.0–3.0)

## 2018-01-22 NOTE — Progress Notes (Signed)
I have reviewed and agree with note, evaluation, plan.   Belky Mundo, MD  

## 2018-01-22 NOTE — Patient Instructions (Addendum)
Pre visit review using our clinic review tool, if applicable. No additional management support is needed unless otherwise documented below in the visit note.  Hold coumadin tomorrow (8/22) and then continue to take 1 tablet daily except 2 tablets on Monday, Wed, and Fridays.  Re-check in 4 weeks.

## 2018-02-05 NOTE — Progress Notes (Signed)
Subjective:   Shawn [name] is a 83 y.o. male who presents for Medicare Annual/Subsequent preventive examination.  Lives in two story home alone. Mostly lives on first floor. 3 siblings live close by.   Sleeps 7-8 hrs/night. Gets up to use restroom 2-3x/night. Wakes up feeling rested. Daily naps.   Diet: Breakfast: Eggs and bacon or sausage. 2 cup coffee.  Lunch: Drive through [employer] for burgers.  Dinner: Meat, vegetables, starch.  Drinks 1 diet drink and sugar free tea. 5 glasses water/day. Patient states his sister also makes him a lot of his meals. I discussed decreasing the take out or trying to choose the healthiest option at restaurants. Patient states his wife used to do all the cooking so it has been a Immunologist process.    Health Maintenance Due  Topic Date Due  . INFLUENZA VACCINE  01/02/2018   psa 05/2014 Cardiac Risk Factors include: advanced age (>31men, >39 women);male gender;dyslipidemia;hypertension  Colonoscopy 02/2016     Objective:    Vitals: BP 130/64 (BP Location: Right Arm, Patient Position: Sitting, Cuff Size: Normal)   Pulse 86   Resp 16   Ht 5\' 7"  (1.702 m)   Wt 207 lb 6.4 oz (94.1 kg)   SpO2 98%   BMI 32.48 kg/m   Body mass index is 32.48 kg/m.  Advanced Directives 02/06/2018 02/17/2016 05/14/2011 05/14/2011  Does Patient Have a Medical Advance Directive? Yes Yes Patient would not like information Patient does not have advance directive  Type of Advance Directive [place];Living will [place];Living will - -  Does patient want to make changes to medical advance directive? No - Patient declined - - -  Copy of [place] in Chart? No - copy requested Yes - -  Pre-existing out of facility DNR order (yellow form or pink MOST form) - - - No    Tobacco Social History   Tobacco Use  Smoking Status Never Smoker  Smokeless Tobacco Never Used     Counseling given: Not Answered       Past Medical History:  Diagnosis Date  . Actinic keratosis   . Adenomatous colon polyp   . ALLERGIC RHINITIS   . Arthritis   . CAD, NATIVE VESSEL    a. DES x2 to RCA 2004 b. repeat PTCA to RCA 2008 c. PCI to RCA 2012 non-DES   . Cellulitis and abscess of trunk   . Diverticulosis   . GERD (gastroesophageal reflux disease)   . HYPERLIPIDEMIA   . HYPERTENSION   . OTH MALIG NEOPLASM SKIN OTH\T\UNSPEC PARTS FACE   . Permanent atrial fibrillation ([place])   . Skin cancer   . Tachy-brady syndrome ([place])    a. s/p MDT dual chamber pacemaker implant complicated by pericardial effusion and micro-perforatation s/p lead revisions   Past Surgical History:  Procedure Laterality Date  . BACK SURGERY  2000   "for bulging discs"  . CATARACT EXTRACTION W/ INTRAOCULAR LENS  IMPLANT, BILATERAL  2012  . CORONARY ANGIOPLASTY WITH STENT PLACEMENT  2008  . CORONARY ANGIOPLASTY WITH STENT PLACEMENT  05/14/11  . CYSTECTOMY     from right side of face.  . EP IMPLANTABLE DEVICE N/A 12/16/2014   Procedure: PPM Generator Changeout;  Surgeon: Evans Lance, MD;  Location: [facility];  Service: Cardiovascular;  Laterality: N/A;  . LEAD REVISION  2008   11 days post initial implant, RA and RV lead revisions for micro-perforation and pericardial effusion  by Dr Lovena Le  . LEFT HEART CATHETERIZATION WITH CORONARY ANGIOGRAM N/A 05/14/2011   Procedure: LEFT HEART CATHETERIZATION WITH CORONARY ANGIOGRAM;  Surgeon: Hillary Bow, MD;  Location: [facility] CATH LAB;  Service: Cardiovascular;  Laterality: N/A;  . PACEMAKER INSERTION  2008   MDT dual chamber pacemaker implanted by Dr Percival Spanish   Family History  Problem Relation Age of Onset  . Ovarian cancer Mother   . Heart attack Father   . Colon cancer Father 45       deceased 27  . Diabetes Brother        x 2  . Heart disease Brother        x 3   Social History   Socioeconomic History  . Marital status: Married    Spouse name: Not on file  . Number of  children: 2  . Years of education: Not on file  . Highest education level: Not on file  Occupational History  . Occupation: retired    Comment: [employer]   Social Needs  . Financial resource strain: Not on file  . Food insecurity:    Worry: Not on file    Inability: Not on file  . Transportation needs:    Medical: Not on file    Non-medical: Not on file  Tobacco Use  . Smoking status: Never Smoker  . Smokeless tobacco: Never Used  Substance and Sexual Activity  . Alcohol use: No    Comment: 05/14/11 "last occasional drink was in 1990's"  . Drug use: No  . Sexual activity: Yes  Lifestyle  . Physical activity:    Days per week: Not on file    Minutes per session: Not on file  . Stress: Not on file  Relationships  . Social connections:    Talks on phone: Not on file    Gets together: Not on file    Attends religious service: Not on file    Active member of club or organization: Not on file    Attends meetings of clubs or organizations: Not on file    Relationship status: Not on file  Other Topics Concern  . Not on file  Social History Narrative   Widowed March 2016. 2 sons. 4 grandkids- all through college. 1 greatgrandchild on way 11/2015.    Lives by himself.       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    Outpatient Encounter Medications as of 02/06/2018  Medication Sig  . atorvastatin (LIPITOR) 40 MG tablet Take 1 tablet (40 mg total) by mouth daily at 6 PM.  . CARTIA XT 240 MG 24 hr capsule TAKE 1 CAPSULE EVERY DAY  . fluticasone (FLONASE) 50 MCG/ACT nasal spray USE 2 SPRAYS IN EACH NOSTRIL EVERY DAY  . metoprolol succinate (TOPROL-XL) 50 MG 24 hr tablet TAKE 1 AND 1/2 TABLETS ONE TIME DAILY  . Multiple Vitamins-Minerals (MULTIVITAMIN & MINERAL PO) Take 1 tablet by mouth daily.  . nitroGLYCERIN (NITROSTAT) 0.4 MG SL tablet Place 1  tablet (0.4 mg total) every 5 (five) minutes as needed under the tongue for chest pain.  Marland Kitchen omeprazole (PRILOSEC) 20 MG capsule Take 1 capsule (20 mg total) by mouth daily.  . tamsulosin (FLOMAX) 0.4 MG CAPS capsule Take 2 capsules (0.8 mg total) by mouth daily.  Marland Kitchen warfarin (COUMADIN) 2.5 MG tablet Take 1 tablet  daily except take 2 tablets on Mon Wed and Fri or TAKE AS DIRECTED BY THE COUMADIN CLINIC   No facility-administered encounter medications on file as of 02/06/2018.     Activities of Daily Living In your present state of health, do you have any difficulty performing the following activities: 02/06/2018 05/20/2017  Hearing? N N  Vision? N N  Difficulty concentrating or making decisions? N N  Walking or climbing stairs? N N  Comment Sometimes due to knee -  Dressing or bathing? N N  Doing errands, shopping? N N  Preparing Food and eating ? N N  Using the Toilet? N N  In the past six months, have you accidently leaked urine? N N  Do you have problems with loss of bowel control? N N  Managing your Medications? N N  Managing your Finances? N N  Housekeeping or managing your Housekeeping? N N  Some recent data might be hidden    Patient Care Team: Marin Olp, MD as PCP - General (Family Medicine)   Assessment:   This is a routine wellness examination for Shawn [name].  Exercise Activities and Dietary recommendations Current Exercise Habits: The patient does not participate in regular exercise at present(Mows grass for several acres. ), Exercise limited by: None identified  Goals   None     Fall Risk Fall Risk  02/06/2018 05/20/2017 11/21/2015 09/09/2014 06/17/2013  Falls in the past year? No No No No No     Depression Screen PHQ 2/9 Scores 02/06/2018 05/20/2017 11/21/2015 09/09/2014  PHQ - 2 Score 0 0 0 0  PHQ- 9 Score 0 - - -  PHQ 9 completed. Score 0. No signs or symptoms of depression. Wife passed away 5 years ago. Patient states he has a lot of family around and stays busy  with his property.   Cognitive Function   Ad8 score reviewed for issues:  Issues making decisions: 0  Less interest in hobbies / activities:0  Repeats questions, stories (family complaining):0  Trouble using ordinary gadgets (microwave, computer, phone):0  Forgets the month or year: 0  Mismanaging finances: 0  Remembering appts:0  Daily problems with thinking and/or memory:0 Ad8 score is=0 Patient does puzzles when at the beach. Discussed increasing frequency.        Immunization History  Administered Date(s) Administered  . Influenza Split 03/24/2012  . Influenza Whole 03/10/2008, 03/16/2009, 04/05/2010  . Influenza, High Dose Seasonal PF 04/02/2016, 04/08/2017  . Influenza,inj,Quad PF,6+ Mos 03/30/2013, 03/16/2014, 03/11/2015  . Pneumococcal Conjugate-13 09/09/2014  . Pneumococcal Polysaccharide-23 11/05/2011  . Tdap 11/05/2011      Screening Tests Health Maintenance  Topic Date Due  . INFLUENZA VACCINE  01/02/2018  . COLONOSCOPY  02/16/2021  . TETANUS/TDAP  11/04/2021  . PNA vac Low Risk Adult  Completed      Plan:      PCP Notes:  Health Maintenance Flu- will receive in October at Coumadin visit.  Abnormal Screens  None.  Patient concerns; None. Patient may come back to discuss his right knee pain again.   Nurse Concerns; None.   Next PCP apt None scheduled.   I have personally reviewed and noted the following in the patient's chart:   . Medical and social history . Use of alcohol, tobacco or illicit drugs  . Current medications and supplements . Functional ability and status . Nutritional status . Physical activity . Advanced directives . List of other physicians . Vitals . Screenings to include cognitive, depression, and falls .  Referrals and appointments  In addition, I have reviewed and discussed with patient certain preventive protocols, quality metrics, and best practice recommendations. A written personalized care plan for  preventive services as well as general preventive health recommendations were provided to patient.     Williemae Area, RN  02/06/2018

## 2018-02-06 ENCOUNTER — Ambulatory Visit (INDEPENDENT_AMBULATORY_CARE_PROVIDER_SITE_OTHER): Payer: Medicare HMO | Admitting: *Deleted

## 2018-02-06 VITALS — BP 130/64 | HR 86 | Resp 16 | Ht 67.0 in | Wt 207.4 lb

## 2018-02-06 DIAGNOSIS — Z0000 Encounter for general adult medical examination without abnormal findings: Secondary | ICD-10-CM

## 2018-02-06 NOTE — Patient Instructions (Addendum)
Shawn [name] ,  Look into Silver sneakers or any insurance covered gym program.   Bring a copy of your living will and/or healthcare power of attorney to your next office visit.  Thank you for taking time to come for your Medicare Wellness Visit. I appreciate your ongoing commitment to your health goals. Please review the following plan we discussed and let me know if I can assist you in the future.   These are the goals we discussed: Goals   None     This is a list of the screening recommended for you and due dates:  Health Maintenance  Topic Date Due  . Flu Shot  01/02/2018  . Colon Cancer Screening  02/16/2021  . Tetanus Vaccine  11/04/2021  . Pneumonia vaccines  Completed   Preventive Care for Adults  A healthy lifestyle and preventive care can promote health and wellness. Preventive health guidelines for adults include the following key practices.  . A routine yearly physical is a good way to check with your health care provider about your health and preventive screening. It is a chance to share any concerns and updates on your health and to receive a thorough exam.  . Visit your dentist for a routine exam and preventive care every 6 months. Brush your teeth twice a day and floss once a day. Good oral hygiene prevents tooth decay and gum disease.  . The frequency of eye exams is based on your age, health, family medical history, use  of contact lenses, and other factors. Follow your health care provider's recommendations for frequency of eye exams.  . Eat a healthy diet. Foods like vegetables, fruits, whole grains, low-fat dairy products, and lean protein foods contain the nutrients you need without too many calories. Decrease your intake of foods high in solid fats, added sugars, and salt. Eat the right amount of calories for you. Get information about a proper diet from your health care provider, if necessary.  . Regular physical exercise is one of the most important things  you can do for your health. Most adults should get at least 150 minutes of moderate-intensity exercise (any activity that increases your heart rate and causes you to sweat) each week. In addition, most adults need muscle-strengthening exercises on 2 or more days a week.  Silver Sneakers may be a benefit available to you. To determine eligibility, you may visit the website: www.silversneakers.com or contact program at 504-818-1779 Mon-Fri between 8AM-8PM.   . Maintain a healthy weight. The body mass index (BMI) is a screening tool to identify possible weight problems. It provides an estimate of body fat based on height and weight. Your health care provider can find your BMI and can help you achieve or maintain a healthy weight.   For adults 20 years and older: ? A BMI below 18.5 is considered underweight. ? A BMI of 18.5 to 24.9 is normal. ? A BMI of 25 to 29.9 is considered overweight. ? A BMI of 30 and above is considered obese.   . Maintain normal blood lipids and cholesterol levels by exercising and minimizing your intake of saturated fat. Eat a balanced diet with plenty of fruit and vegetables. Blood tests for lipids and cholesterol should begin at age 84 and be repeated every 5 years. If your lipid or cholesterol levels are high, you are over 50, or you are at high risk for heart disease, you may need your cholesterol levels checked more frequently. Ongoing high lipid and cholesterol levels  should be treated with medicines if diet and exercise are not working.  . If you smoke, find out from your health care provider how to quit. If you do not use tobacco, please do not start.  . If you choose to drink alcohol, please do not consume more than 2 drinks per day. One drink is considered to be 12 ounces (355 mL) of beer, 5 ounces (148 mL) of wine, or 1.5 ounces (44 mL) of liquor.  . If you are 74-20 years old, ask your health care provider if you should take aspirin to prevent strokes.  . Use  sunscreen. Apply sunscreen liberally and repeatedly throughout the day. You should seek shade when your shadow is shorter than you. Protect yourself by wearing long sleeves, pants, a wide-brimmed hat, and sunglasses year round, whenever you are outdoors.  . Once a month, do a whole body skin exam, using a mirror to look at the skin on your back. Tell your health care provider of new moles, moles that have irregular borders, moles that are larger than a pencil eraser, or moles that have changed in shape or color.

## 2018-02-06 NOTE — Progress Notes (Signed)
I have reviewed and agree with note, evaluation, plan.  Happy to see patient to discuss knee pain concerns.   Garret Reddish, MD

## 2018-02-19 ENCOUNTER — Ambulatory Visit (INDEPENDENT_AMBULATORY_CARE_PROVIDER_SITE_OTHER): Payer: Medicare HMO | Admitting: General Practice

## 2018-02-19 DIAGNOSIS — I4891 Unspecified atrial fibrillation: Secondary | ICD-10-CM | POA: Diagnosis not present

## 2018-02-19 DIAGNOSIS — Z7901 Long term (current) use of anticoagulants: Secondary | ICD-10-CM

## 2018-02-19 LAB — POCT INR: INR: 2.8 (ref 2.0–3.0)

## 2018-02-19 NOTE — Progress Notes (Signed)
I have reviewed and agree with note, evaluation, plan.   Koltin Wehmeyer, MD  

## 2018-02-19 NOTE — Patient Instructions (Addendum)
Pre visit review using our clinic review tool, if applicable. No additional management support is needed unless otherwise documented below in the visit note.  Continue to take 1 tablet daily except 2 tablets on Monday, Wed, and Fridays.  Re-check in 4 weeks.  

## 2018-02-24 LAB — CUP PACEART REMOTE DEVICE CHECK
Battery Impedance: 617 Ohm
Date Time Interrogation Session: 20190817120502
Implantable Lead Implant Date: 20081007
Implantable Lead Location: 753860
Implantable Lead Model: 5076
Implantable Lead Model: 5076
Implantable Pulse Generator Implant Date: 20160714
Lead Channel Impedance Value: 0 Ohm
Lead Channel Impedance Value: 565 Ohm
Lead Channel Pacing Threshold Pulse Width: 0.4 ms
Lead Channel Setting Pacing Amplitude: 2 V
Lead Channel Setting Sensing Sensitivity: 1.4 mV
MDC IDC LEAD IMPLANT DT: 20081007
MDC IDC LEAD LOCATION: 753859
MDC IDC MSMT BATTERY REMAINING LONGEVITY: 79 mo
MDC IDC MSMT BATTERY VOLTAGE: 2.78 V
MDC IDC MSMT LEADCHNL RV PACING THRESHOLD AMPLITUDE: 0.625 V
MDC IDC SET LEADCHNL RV PACING PULSEWIDTH: 0.4 ms
MDC IDC STAT BRADY RV PERCENT PACED: 70 %

## 2018-03-04 ENCOUNTER — Ambulatory Visit: Payer: Medicare HMO | Admitting: Internal Medicine

## 2018-03-04 ENCOUNTER — Encounter: Payer: Self-pay | Admitting: Internal Medicine

## 2018-03-04 VITALS — BP 124/60 | HR 86 | Ht 67.0 in | Wt 203.0 lb

## 2018-03-04 DIAGNOSIS — Z950 Presence of cardiac pacemaker: Secondary | ICD-10-CM

## 2018-03-04 DIAGNOSIS — I495 Sick sinus syndrome: Secondary | ICD-10-CM | POA: Diagnosis not present

## 2018-03-04 DIAGNOSIS — I10 Essential (primary) hypertension: Secondary | ICD-10-CM | POA: Diagnosis not present

## 2018-03-04 DIAGNOSIS — I4891 Unspecified atrial fibrillation: Secondary | ICD-10-CM | POA: Diagnosis not present

## 2018-03-04 LAB — CUP PACEART INCLINIC DEVICE CHECK
Battery Voltage: 2.77 V
Date Time Interrogation Session: 20191001111449
Implantable Lead Location: 753859
Implantable Lead Location: 753860
Implantable Lead Model: 5076
Implantable Lead Model: 5076
Implantable Pulse Generator Implant Date: 20160714
Lead Channel Pacing Threshold Pulse Width: 0.4 ms
Lead Channel Sensing Intrinsic Amplitude: 2.8 mV
Lead Channel Setting Pacing Amplitude: 2 V
MDC IDC LEAD IMPLANT DT: 20081007
MDC IDC LEAD IMPLANT DT: 20081007
MDC IDC MSMT BATTERY IMPEDANCE: 640 Ohm
MDC IDC MSMT BATTERY REMAINING LONGEVITY: 78 mo
MDC IDC MSMT LEADCHNL RA IMPEDANCE VALUE: 0 Ohm
MDC IDC MSMT LEADCHNL RV IMPEDANCE VALUE: 508 Ohm
MDC IDC MSMT LEADCHNL RV PACING THRESHOLD AMPLITUDE: 0.625 V
MDC IDC MSMT LEADCHNL RV PACING THRESHOLD AMPLITUDE: 0.75 V
MDC IDC MSMT LEADCHNL RV PACING THRESHOLD PULSEWIDTH: 0.4 ms
MDC IDC SET LEADCHNL RV PACING PULSEWIDTH: 0.4 ms
MDC IDC SET LEADCHNL RV SENSING SENSITIVITY: 1.4 mV
MDC IDC STAT BRADY RV PERCENT PACED: 70 %

## 2018-03-04 NOTE — Progress Notes (Signed)
HPI Shawn [name] returns today for ongoing evaluation and management of his atrial fibrillation and intermittent heart block status post pacemaker insertion. Since I saw the patient in the last year, he has done well. He does admit to some dietary indiscretion although he has not gained any weight. He denies chest pain. He has minimal dyspnea with exertion and chronic trace peripheral edema. No syncope. He denies any anginal symptoms. He is limited by arthritis in his knees.  No Known Allergies   Current Outpatient Medications  Medication Sig Dispense Refill  . atorvastatin (LIPITOR) 40 MG tablet Take 1 tablet (40 mg total) by mouth daily at 6 PM. 90 tablet 2  . CARTIA XT 240 MG 24 hr capsule TAKE 1 CAPSULE EVERY DAY 90 capsule 3  . fluticasone (FLONASE) 50 MCG/ACT nasal spray USE 2 SPRAYS IN EACH NOSTRIL EVERY DAY 48 g 2  . metoprolol succinate (TOPROL-XL) 50 MG 24 hr tablet TAKE 1 AND 1/2 TABLETS ONE TIME DAILY 135 tablet 1  . Multiple Vitamins-Minerals (MULTIVITAMIN & MINERAL PO) Take 1 tablet by mouth daily.    . nitroGLYCERIN (NITROSTAT) 0.4 MG SL tablet Place 1 tablet (0.4 mg total) every 5 (five) minutes as needed under the tongue for chest pain. 25 tablet 1  . omeprazole (PRILOSEC) 20 MG capsule Take 1 capsule (20 mg total) by mouth daily. 90 capsule 3  . tamsulosin (FLOMAX) 0.4 MG CAPS capsule Take 2 capsules (0.8 mg total) by mouth daily. 180 capsule 3  . warfarin (COUMADIN) 2.5 MG tablet Take 1 tablet daily except take 2 tablets on Mon Wed and Fri or TAKE AS DIRECTED BY THE COUMADIN CLINIC 135 tablet 1   No current facility-administered medications for this visit.      Past Medical History:  Diagnosis Date  . Actinic keratosis   . Adenomatous colon polyp   . ALLERGIC RHINITIS   . Arthritis   . CAD, NATIVE VESSEL    a. DES x2 to RCA 2004 b. repeat PTCA to RCA 2008 c. PCI to RCA 2012 non-DES   . Cellulitis and abscess of trunk   . Diverticulosis   . GERD  (gastroesophageal reflux disease)   . HYPERLIPIDEMIA   . HYPERTENSION   . OTH MALIG NEOPLASM SKIN OTH\T\UNSPEC PARTS FACE   . Permanent atrial fibrillation   . Skin cancer   . Tachy-brady syndrome ([place])    a. s/p MDT dual chamber pacemaker implant complicated by pericardial effusion and micro-perforatation s/p lead revisions    ROS:   All systems reviewed and negative except as noted in the HPI.   Past Surgical History:  Procedure Laterality Date  . BACK SURGERY  2000   "for bulging discs"  . CATARACT EXTRACTION W/ INTRAOCULAR LENS  IMPLANT, BILATERAL  2012  . CORONARY ANGIOPLASTY WITH STENT PLACEMENT  2008  . CORONARY ANGIOPLASTY WITH STENT PLACEMENT  05/14/11  . CYSTECTOMY     from right side of face.  . EP IMPLANTABLE DEVICE N/A 12/16/2014   Procedure: PPM Generator Changeout;  Surgeon: Evans Lance, MD;  Location: [place] [facility];  Service: Cardiovascular;  Laterality: N/A;  . LEAD REVISION  2008   11 days post initial implant, RA and RV lead revisions for micro-perforation and pericardial effusion by Dr Lovena Le  . LEFT HEART CATHETERIZATION WITH CORONARY ANGIOGRAM N/A 05/14/2011   Procedure: LEFT HEART CATHETERIZATION WITH CORONARY ANGIOGRAM;  Surgeon: Hillary Bow, MD;  Location: [facility] CATH LAB;  Service: Cardiovascular;  Laterality: N/A;  . PACEMAKER INSERTION  2008   MDT dual chamber pacemaker implanted by Dr Percival Spanish     Family History  Problem Relation Age of Onset  . Ovarian cancer Mother   . Heart attack Father   . Colon cancer Father 56       deceased 74  . Diabetes Brother        x 2  . Heart disease Brother        x 3     Social History   Socioeconomic History  . Marital status: Married    Spouse name: Not on file  . Number of children: 2  . Years of education: Not on file  . Highest education level: Not on file  Occupational History  . Occupation: retired    Comment: [employer]   Social Needs  . Financial resource strain: Not on file  .  Food insecurity:    Worry: Not on file    Inability: Not on file  . Transportation needs:    Medical: Not on file    Non-medical: Not on file  Tobacco Use  . Smoking status: Never Smoker  . Smokeless tobacco: Never Used  Substance and Sexual Activity  . Alcohol use: No    Comment: 05/14/11 "last occasional drink was in 1990's"  . Drug use: No  . Sexual activity: Yes  Lifestyle  . Physical activity:    Days per week: Not on file    Minutes per session: Not on file  . Stress: Not on file  Relationships  . Social connections:    Talks on phone: Not on file    Gets together: Not on file    Attends religious service: Not on file    Active member of club or organization: Not on file    Attends meetings of clubs or organizations: Not on file    Relationship status: Not on file  . Intimate partner violence:    Fear of current or ex partner: Not on file    Emotionally abused: Not on file    Physically abused: Not on file    Forced sexual activity: Not on file  Other Topics Concern  . Not on file  Social History Narrative   Widowed March 2016. 2 sons. 4 grandkids- all through college. 1 greatgrandchild on way 11/2015.    Lives by himself.       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     BP 124/60   Pulse 86   Ht 5\' 7"  (1.702 m)   Wt 203 lb (92.1 kg)   BMI 31.79 kg/m   Physical Exam:  Well appearing NAD HEENT: Unremarkable Neck:  No JVD, no thyromegally Lymphatics:  No adenopathy Back:  No CVA tenderness Lungs:  Clear with no wheezes HEART:  IRegular rate rhythm, no murmurs, no rubs, no clicks Abd:  soft, positive bowel sounds, no organomegally, no rebound, no guarding Ext:  2 plus pulses, no edema, no cyanosis, no clubbing Skin:  No rashes no nodules Neuro:  CN II through XII intact, motor grossly intact  EKG - atrial fib with a  controlled VR  DEVICE  Normal device function.  See PaceArt for details.   Assess/Plan: 1. Atrial fib - his ventricular rate is controlled. No change in his meds. 2. PPM - his medtronic device  is working normally. We will recheck in several months 3. Obesity - his BMI is over 30 and I have asked him to lose weight. 4. HTN - his blood pressure is well controlled on medical therapy. Besides weight loss I have asked him to continue his current meds.   Mikle Bosworth.D.

## 2018-03-04 NOTE — Patient Instructions (Signed)
Medication Instructions:  Your physician recommends that you continue on your current medications as directed. Please refer to the Current Medication list given to you today.  Labwork: None ordered.  Testing/Procedures: None ordered.  Follow-Up: Your physician wants you to follow-up in: one year with Dr. Lovena Le.   You will receive a reminder letter in the mail two months in advance. If you don't receive a letter, please call our office to schedule the follow-up appointment.  Remote monitoring is used to monitor your Pacemaker from home. This monitoring reduces the number of office visits required to check your device to one time per year. It allows [place] to keep an eye on the functioning of your device to ensure it is working properly. You are scheduled for a device check from home on 04/21/2018. You may send your transmission at any time that day. If you have a wireless device, the transmission will be sent automatically. After your physician reviews your transmission, you will receive a postcard with your next transmission date.  Any Other Special Instructions Will Be Listed Below (If Applicable).  If you need a refill on your cardiac medications before your next appointment, please call your pharmacy.

## 2018-03-19 ENCOUNTER — Ambulatory Visit (INDEPENDENT_AMBULATORY_CARE_PROVIDER_SITE_OTHER): Payer: Medicare HMO | Admitting: General Practice

## 2018-03-19 DIAGNOSIS — Z23 Encounter for immunization: Secondary | ICD-10-CM | POA: Diagnosis not present

## 2018-03-19 DIAGNOSIS — Z7901 Long term (current) use of anticoagulants: Secondary | ICD-10-CM

## 2018-03-19 DIAGNOSIS — I4891 Unspecified atrial fibrillation: Secondary | ICD-10-CM | POA: Diagnosis not present

## 2018-03-19 LAB — POCT INR: INR: 2.6 (ref 2.0–3.0)

## 2018-03-19 NOTE — Progress Notes (Signed)
flu

## 2018-03-19 NOTE — Patient Instructions (Addendum)
Pre visit review using our clinic review tool, if applicable. No additional management support is needed unless otherwise documented below in the visit note.  Continue to take 1 tablet daily except 2 tablets on Monday, Wed, and Fridays.  Re-check in 4 weeks.  

## 2018-03-19 NOTE — Progress Notes (Signed)
I have reviewed and agree with note, evaluation, plan.   Tasia Liz, MD  

## 2018-03-27 ENCOUNTER — Other Ambulatory Visit: Payer: Self-pay | Admitting: Family Medicine

## 2018-04-07 ENCOUNTER — Other Ambulatory Visit: Payer: Self-pay | Admitting: Family Medicine

## 2018-04-09 NOTE — Progress Notes (Signed)
HPI The patient presents for yearly followup of atrial fib. Since last saw him he has done well.  He occasionally will feel his heart going a little bit fast and he drinks his caffeinated coffee but he likes his caffeinated coffee.  He does his yard work and is getting a believes right now.  He denies any chest pressure, neck or arm discomfort.  He does not have any presyncope or syncope.  He has no shortness of breath, PND or orthopnea.  He has no weight gain or edema.  He has had some mild diarrhea recently and some left lower quadrant abdominal discomfort occasionally.  No Known Allergies  Prior to Admission medications   Medication Sig Start Date End Date Taking? Authorizing Provider  atorvastatin (LIPITOR) 40 MG tablet TAKE 1 TABLET (40 MG TOTAL) BY MOUTH DAILY. 04/09/17  Yes Marin Olp, MD  diltiazem (CARTIA XT) 240 MG 24 hr capsule Take 1 capsule (240 mg total) by mouth daily. 02/19/17  Yes Marin Olp, MD  fluticasone [facility]) 50 MCG/ACT nasal spray USE 2 SPRAYS IN EACH NOSTRIL EVERY DAY 04/09/17  Yes Marin Olp, MD  metoprolol succinate (TOPROL-XL) 50 MG 24 hr tablet TAKE 1 AND 1/2 TABLETS ONE TIME DAILY (NEED MD APPOINTMENT) 04/08/17  Yes Marin Olp, MD  Multiple Vitamins-Minerals (MULTIVITAMIN & MINERAL PO) Take 1 tablet by mouth daily.   Yes [provider]  nitroGLYCERIN (NITROSTAT) 0.4 MG SL tablet Place 0.4 mg under the tongue every 5 (five) minutes x 3 doses as needed for chest pain.   Yes [provider]  omeprazole (PRILOSEC) 20 MG capsule Take 1 capsule (20 mg total) by mouth daily. 07/18/16  Yes Marin Olp, MD  tamsulosin (FLOMAX) 0.4 MG CAPS capsule TAKE 1 CAPSULE EVERY DAY 11/29/16  Yes Marin Olp, MD  warfarin (COUMADIN) 2.5 MG tablet TAKE AS DIRECTED BY THE COUMADIN CLINIC 11/19/16  Yes Marin Olp, MD     Past Medical History:  Diagnosis Date  . Actinic keratosis   . Adenomatous colon polyp   . ALLERGIC  RHINITIS   . Arthritis   . CAD, NATIVE VESSEL    a. DES x2 to RCA 2004 b. repeat PTCA to RCA 2008 c. PCI to RCA 2012 non-DES   . Cellulitis and abscess of trunk   . Diverticulosis   . GERD (gastroesophageal reflux disease)   . HYPERLIPIDEMIA   . HYPERTENSION   . OTH MALIG NEOPLASM SKIN OTH\T\UNSPEC PARTS FACE   . Permanent atrial fibrillation   . Skin cancer   . Tachy-brady syndrome ([place])    a. s/p MDT dual chamber pacemaker implant complicated by pericardial effusion and micro-perforatation s/p lead revisions    Past Surgical History:  Procedure Laterality Date  . BACK SURGERY  2000   "for bulging discs"  . CATARACT EXTRACTION W/ INTRAOCULAR LENS  IMPLANT, BILATERAL  2012  . CORONARY ANGIOPLASTY WITH STENT PLACEMENT  2008  . CORONARY ANGIOPLASTY WITH STENT PLACEMENT  05/14/11  . CYSTECTOMY     from right side of face.  . EP IMPLANTABLE DEVICE N/A 12/16/2014   Procedure: PPM Generator Changeout;  Surgeon: Evans Lance, MD;  Location: [place] [facility];  Service: Cardiovascular;  Laterality: N/A;  . LEAD REVISION  2008   11 days post initial implant, RA and RV lead revisions for micro-perforation and pericardial effusion by Dr Lovena Le  . LEFT HEART CATHETERIZATION WITH CORONARY ANGIOGRAM N/A 05/14/2011  Procedure: LEFT HEART CATHETERIZATION WITH CORONARY ANGIOGRAM;  Surgeon: Hillary Bow, MD;  [facility] C[facility] LAB;  Service: Cardiovascular;  Laterality: N/A;  . PACEMAKER INSERTION  2008   MDT dual chamber pacemaker implanted by Dr Percival Spanish    ROS: Positive for knee pain.  Otherwise as stated in the HPI negative for all systems  PHYSICAL EXAM BP 140/64 (BP Location: Left Arm, Patient Position: Sitting, Cuff Size: Large)   Pulse 84   Ht 5\' 7"  (1.702 m)   Wt 207 lb (93.9 kg)   BMI 32.42 kg/m   GENERAL:  Well appearing NECK:  No jugular venous distention, waveform within normal limits, carotid upstroke brisk and symmetric, no bruits, no thyromegaly LUNGS:  Clear to  auscultation bilaterally CHEST:  Unremarkable HEART:  PMI not displaced or sustained,S1 and S2 within normal limits, no S3,  no clicks, no rubs, no murmurs, irregular ABD:  Flat, positive bowel sounds normal in frequency in pitch, no bruits, no rebound, no guarding, no midline pulsatile mass, no hepatomegaly, no splenomegaly EXT:  2 plus pulses throughout, mild left greater than right leg edema, no cyanosis no clubbing   EKG:  Atrial fib , ventricular pacing 100% capture.  03/04/18  Lab Results  Component Value Date   CHOL 109 04/16/2017   TRIG 111 04/16/2017   HDL 39 (L) 04/16/2017   LDLCALC 48 04/16/2017   LDLDIRECT 54.0 11/21/2015    ASSESSMENT AND PLAN  ATRIAL FIBRILLATION -    [name] has a CHA2DS2 - VASc score of 3 with a risk of stroke of 3.2%.   He tolerates anticoagulation.  No change in therapy.    CAD, NATIVE VESSEL -  He had a stress perfusion study in 2012 demonstrated no high risk findings.  No further testing is indicated.    PACEMAKER, PERMANENT -  He is up to date with follow up.  He has a single pacemaker device.   DYSLIPIDEMIA - LDL was at target last year.  It is 48.  No change in therapy.

## 2018-04-11 ENCOUNTER — Ambulatory Visit: Payer: Medicare HMO | Admitting: Cardiology

## 2018-04-11 ENCOUNTER — Encounter: Payer: Self-pay | Admitting: Cardiology

## 2018-04-11 VITALS — BP 140/64 | HR 84 | Ht 67.0 in | Wt 207.0 lb

## 2018-04-11 DIAGNOSIS — E785 Hyperlipidemia, unspecified: Secondary | ICD-10-CM

## 2018-04-11 DIAGNOSIS — I2510 Atherosclerotic heart disease of native coronary artery without angina pectoris: Secondary | ICD-10-CM

## 2018-04-11 DIAGNOSIS — I482 Chronic atrial fibrillation, unspecified: Secondary | ICD-10-CM | POA: Diagnosis not present

## 2018-04-11 MED ORDER — NITROGLYCERIN 0.4 MG SL SUBL: 0.4000 mg | SUBLINGUAL_TABLET | SUBLINGUAL | 1 refills | Status: DC | PRN

## 2018-04-11 NOTE — Patient Instructions (Signed)
Medication Instructions:  Continue current medications  If you need a refill on your cardiac medications before your next appointment, please call your pharmacy.  Labwork: None Ordered  If you have labs (blood work) drawn today and your tests are completely normal, you will receive your results only by: Marland Kitchen MyChart Message (if you have MyChart) OR . A paper copy in the mail If you have any lab test that is abnormal or we need to change your treatment, we will call you to review the results.  Testing/Procedures: None ordered  Follow-Up: You will need a follow up appointment in 1 Year.  Please call our office 2 months in advance([phone]) to schedule the (1 Year) appointment.  You may see  DR Percival Spanish, or one of the following Advanced Practice Providers on your designated Care Team:    . Jory Sims, DNP, ANP . Rhonda Barrett, PA-C  . Kerin Ransom, [place]  . Almyra Deforest, PA-C . Fabian Sharp, PA-C  At [facility], you and your health needs are our priority.  As part of our continuing mission to provide you with exceptional heart care, we have created designated Provider Care Teams.  These Care Teams include your primary Cardiologist (physician) and Advanced Practice Providers (APPs -  Physician Assistants and Nurse Practitioners) who all work together to provide you with the care you need, when you need it.  Thank you for choosing [facility] at [facility]!!

## 2018-04-16 ENCOUNTER — Ambulatory Visit (INDEPENDENT_AMBULATORY_CARE_PROVIDER_SITE_OTHER): Payer: Medicare HMO | Admitting: General Practice

## 2018-04-16 DIAGNOSIS — I4891 Unspecified atrial fibrillation: Secondary | ICD-10-CM

## 2018-04-16 DIAGNOSIS — Z7901 Long term (current) use of anticoagulants: Secondary | ICD-10-CM

## 2018-04-16 LAB — POCT INR: INR: 3.1 — AB (ref 2.0–3.0)

## 2018-04-16 NOTE — Patient Instructions (Addendum)
Pre visit review using our clinic review tool, if applicable. No additional management support is needed unless otherwise documented below in the visit note.  Skip coumadin today and then continue to take 1 tablet daily except 2 tablets on Monday, Wed, and Fridays.  Re-check in 4 weeks.

## 2018-04-16 NOTE — Progress Notes (Signed)
I have reviewed and agree with note, evaluation, plan.   Babe Anthis, MD  

## 2018-04-21 ENCOUNTER — Telehealth: Payer: Self-pay

## 2018-04-21 ENCOUNTER — Ambulatory Visit (INDEPENDENT_AMBULATORY_CARE_PROVIDER_SITE_OTHER): Payer: Medicare HMO

## 2018-04-21 DIAGNOSIS — I495 Sick sinus syndrome: Secondary | ICD-10-CM

## 2018-04-21 NOTE — Telephone Encounter (Signed)
Spoke with pt and reminded pt of remote transmission that is due today. Pt verbalized understanding.   

## 2018-04-23 NOTE — Progress Notes (Signed)
Remote pacemaker transmission.   

## 2018-04-24 ENCOUNTER — Encounter: Payer: Self-pay | Admitting: Cardiology

## 2018-05-09 ENCOUNTER — Ambulatory Visit (INDEPENDENT_AMBULATORY_CARE_PROVIDER_SITE_OTHER): Payer: Medicare HMO | Admitting: Family Medicine

## 2018-05-09 ENCOUNTER — Encounter: Payer: Self-pay | Admitting: Family Medicine

## 2018-05-09 ENCOUNTER — Ambulatory Visit: Payer: Medicare HMO | Admitting: Family Medicine

## 2018-05-09 VITALS — BP 138/64 | HR 88 | Temp 98.6°F | Ht 71.0 in | Wt 207.6 lb

## 2018-05-09 DIAGNOSIS — N390 Urinary tract infection, site not specified: Secondary | ICD-10-CM | POA: Diagnosis not present

## 2018-05-09 DIAGNOSIS — R300 Dysuria: Secondary | ICD-10-CM | POA: Diagnosis not present

## 2018-05-09 LAB — POC URINALSYSI DIPSTICK (AUTOMATED)
BILIRUBIN UA: NEGATIVE
Glucose, UA: NEGATIVE
Ketones, UA: NEGATIVE
Leukocytes, UA: NEGATIVE
NITRITE UA: NEGATIVE
PH UA: 5.5 (ref 5.0–8.0)
Protein, UA: NEGATIVE
Spec Grav, UA: 1.02 (ref 1.010–1.025)
UROBILINOGEN UA: 0.2 U/dL

## 2018-05-09 MED ORDER — CEPHALEXIN 500 MG PO CAPS: 500.0000 mg | ORAL_CAPSULE | Freq: Three times a day (TID) | ORAL | 0 refills | Status: AC

## 2018-05-09 NOTE — Patient Instructions (Addendum)
I am concerned you have a urinary tract infection-I am going to treat you with cephalexin 3 times a day for 10 days to help get rid of this infection.  Your prostate did not appear infected today.  I may have to call you to change the antibiotic depending on the urine Gram stain and culture results.  We need to know immediately if you have fever or worsening symptoms and would need to see you back

## 2018-05-09 NOTE — Progress Notes (Signed)
Subjective:  Shawn [name] is a 83 y.o. year old very pleasant male patient who presents for/with See problem oriented charting  Past Medical History-  Patient Active Problem List   Diagnosis Date Noted  . CAD, NATIVE VESSEL 09/27/2008    Priority: High  . PACEMAKER, PERMANENT 03/11/2007    Priority: High  . Atrial fibrillation ([place]) 02/11/2007    Priority: High  . History of adenomatous polyp of colon 09/29/2014    Priority: Medium  . BPH associated with nocturia 09/09/2014    Priority: Medium  . Hyperlipidemia 03/10/2008    Priority: Medium  . Essential hypertension 12/02/2006    Priority: Medium  . Encounter for therapeutic drug monitoring 09/24/2013    Priority: Low  . History of thrombocytopenia 06/17/2011    Priority: Low  . Hx of skin cancer, basal cell 04/07/2008    Priority: Low  . Actinic keratosis 04/07/2008    Priority: Low  . Allergic rhinitis 12/02/2006    Priority: Low  . GERD 12/02/2006    Priority: Low  . Osteoarthritis of right knee 12/19/2017  . Long term (current) use of anticoagulants 02/18/2017    Medications- reviewed and updated Current Outpatient Medications  Medication Sig Dispense Refill  . atorvastatin (LIPITOR) 40 MG tablet TAKE 1 TABLET (40 MG TOTAL) BY MOUTH DAILY AT 6 PM. 90 tablet 2  . CARTIA XT 240 MG 24 hr capsule TAKE 1 CAPSULE EVERY DAY 90 capsule 3  . fluticasone (FLONASE) 50 MCG/ACT nasal spray USE 2 SPRAYS IN EACH NOSTRIL EVERY DAY 48 g 2  . metoprolol succinate (TOPROL-XL) 50 MG 24 hr tablet TAKE 1 AND 1/2 TABLETS ONE TIME DAILY 135 tablet 1  . Multiple Vitamins-Minerals (MULTIVITAMIN & MINERAL PO) Take 1 tablet by mouth daily.    . nitroGLYCERIN (NITROSTAT) 0.4 MG SL tablet Place 1 tablet (0.4 mg total) under the tongue every 5 (five) minutes as needed for chest pain. 25 tablet 1  . omeprazole (PRILOSEC) 20 MG capsule Take 1 capsule (20 mg total) by mouth daily. 90 capsule 3  . tamsulosin (FLOMAX) 0.4 MG CAPS capsule Take 2  capsules (0.8 mg total) by mouth daily. 180 capsule 3  . warfarin (COUMADIN) 2.5 MG tablet TAKE 1 TABLET DAILY EXCEPT TAKE 2 TABLETS ON MONDAY, WEDNESDAY, AND FRIDAY OR AS DIRECTED BY THE COUMADIN CLINIC 90 day 135 tablet 1  . cephALEXin (KEFLEX) 500 MG capsule Take 1 capsule (500 mg total) by mouth 3 (three) times daily for 10 days. 30 capsule 0   No current facility-administered medications for this visit.     Objective: BP 138/64   Pulse 88   Temp 98.6 F (37 C) (Oral)   Ht 5\' 11"  (1.803 m)   Wt 207 lb 9.6 oz (94.2 kg)   SpO2 98%   BMI 28.95 kg/m  Gen: NAD, resting comfortably CV: Irregularly irregular no murmurs rubs or gallops Lungs: CTAB no crackles, wheeze, rhonchi Abdomen: soft/nontender/nondistended Ext: no edema Skin: warm, dry  Assessment/Plan:  Concern for UTI S: Patients symptoms started last Sunday night with some burning with urination and then noted difficulty getting stream going. Drank cranberry juice and thought it was getting better but then stopped improving. Straining to urinate and almost feels like needs to have a bowel movement Complains of dysuria: yes; polyuria: yes hourly; nocturia: more than normal; urgency: yes. Has noted incontinence light amounts. Some mild suprapubic pain noted. Marland Kitchen  ROS- no fever, chills, nausea, vomiting, flank pain. No blood in urine.  A/P: Urinary tract infection-benign prostate exam other than BPH.  Will treat with 10 days of Keflex though may need to change course depending on Gram stain or culture.  GFR has been slightly below 60 at times- I still think he can tolerate the 3 times a day dose and this will give [place] a better chance for eradication of illness  From AVS:  " I am concerned you have a urinary tract infection-I am going to treat you with cephalexin 3 times a day for 10 days to help get rid of this infection.  Your prostate did not appear infected today.  I may have to call you to change the antibiotic depending on the  urine Gram stain and culture results.  We need to know immediately if you have fever or worsening symptoms and would need to see you back "  Future Appointments  Date Time Provider [place]  05/14/2018  8:45 AM [facility]  07/21/2018  2:20 PM [facility] DEVICE REMOTES [facility]  02/12/2019 10:00 AM [facility] HEALTH COACH [facility]   Lab/Order associations: Dysuria - Plan: Urine Culture, POCT Urinalysis Dipstick (Automated), Gram stain  Meds ordered this encounter  Medications  . cephALEXin (KEFLEX) 500 MG capsule    Sig: Take 1 capsule (500 mg total) by mouth 3 (three) times daily for 10 days.    Dispense:  30 capsule    Refill:  0    Return precautions advised.  Garret Reddish, MD

## 2018-05-09 NOTE — Addendum Note (Signed)
Addended by: Kayren Eaves T on: 05/09/2018 01:31 PM   Modules accepted: Orders

## 2018-05-11 LAB — GRAM STAIN
MICRO NUMBER: 91463911
SPECIMEN QUALITY: ADEQUATE

## 2018-05-11 LAB — URINE CULTURE
MICRO NUMBER: 91463912
Result:: NO GROWTH
SPECIMEN QUALITY: ADEQUATE

## 2018-05-14 ENCOUNTER — Ambulatory Visit (INDEPENDENT_AMBULATORY_CARE_PROVIDER_SITE_OTHER): Payer: Medicare HMO | Admitting: General Practice

## 2018-05-14 DIAGNOSIS — Z7901 Long term (current) use of anticoagulants: Secondary | ICD-10-CM

## 2018-05-14 DIAGNOSIS — I4891 Unspecified atrial fibrillation: Secondary | ICD-10-CM

## 2018-05-14 LAB — POCT INR: INR: 2.2 (ref 2.0–3.0)

## 2018-05-14 NOTE — Patient Instructions (Addendum)
Pre visit review using our clinic review tool, if applicable. No additional management support is needed unless otherwise documented below in the visit note.  Continue to take 1 tablet daily except 2 tablets on Monday, Wed, and Fridays.  Re-check in 4 weeks.

## 2018-05-14 NOTE — Progress Notes (Signed)
I have reviewed and agree with note, evaluation, plan.   Lace Chenevert, MD  

## 2018-05-19 ENCOUNTER — Ambulatory Visit: Payer: Medicare HMO | Admitting: Family Medicine

## 2018-06-03 ENCOUNTER — Other Ambulatory Visit: Payer: Self-pay | Admitting: Family Medicine

## 2018-06-17 LAB — CUP PACEART REMOTE DEVICE CHECK
Date Time Interrogation Session: 20191118185950
Implantable Lead Implant Date: 20081007
Implantable Lead Model: 5076
Implantable Lead Model: 5076
Implantable Pulse Generator Implant Date: 20160714
Lead Channel Impedance Value: 0 Ohm
Lead Channel Impedance Value: 481 Ohm
Lead Channel Pacing Threshold Amplitude: 0.625 V
Lead Channel Pacing Threshold Pulse Width: 0.4 ms
Lead Channel Setting Sensing Sensitivity: 1.4 mV
MDC IDC LEAD IMPLANT DT: 20081007
MDC IDC LEAD LOCATION: 753859
MDC IDC LEAD LOCATION: 753860
MDC IDC MSMT BATTERY IMPEDANCE: 719 Ohm
MDC IDC MSMT BATTERY REMAINING LONGEVITY: 75 mo
MDC IDC MSMT BATTERY VOLTAGE: 2.77 V
MDC IDC SET LEADCHNL RV PACING AMPLITUDE: 2 V
MDC IDC SET LEADCHNL RV PACING PULSEWIDTH: 0.4 ms
MDC IDC STAT BRADY RV PERCENT PACED: 64 %

## 2018-06-25 ENCOUNTER — Ambulatory Visit (INDEPENDENT_AMBULATORY_CARE_PROVIDER_SITE_OTHER): Payer: Medicare HMO | Admitting: General Practice

## 2018-06-25 DIAGNOSIS — I4891 Unspecified atrial fibrillation: Secondary | ICD-10-CM

## 2018-06-25 DIAGNOSIS — Z7901 Long term (current) use of anticoagulants: Secondary | ICD-10-CM

## 2018-06-25 LAB — POCT INR: INR: 3.4 — AB (ref 2.0–3.0)

## 2018-06-25 NOTE — Progress Notes (Signed)
I have reviewed and agree with note, evaluation, plan.   Stephen Hunter, MD  

## 2018-06-25 NOTE — Patient Instructions (Addendum)
Pre visit review using our clinic review tool, if applicable. No additional management support is needed unless otherwise documented below in the visit note.  Skip coumadin tomorrow and take 1 tablet on Friday.  On Saturday please continue to take 1 tablet daily except 2 tablets on Monday, Wed, and Fridays.  Re-check in 3 or 4 weeks.

## 2018-07-07 ENCOUNTER — Ambulatory Visit (INDEPENDENT_AMBULATORY_CARE_PROVIDER_SITE_OTHER): Payer: Medicare HMO | Admitting: Family Medicine

## 2018-07-07 ENCOUNTER — Encounter: Payer: Self-pay | Admitting: Family Medicine

## 2018-07-07 VITALS — BP 110/60 | HR 71 | Temp 98.1°F | Ht 71.0 in | Wt 204.6 lb

## 2018-07-07 DIAGNOSIS — R351 Nocturia: Secondary | ICD-10-CM | POA: Diagnosis not present

## 2018-07-07 DIAGNOSIS — N401 Enlarged prostate with lower urinary tract symptoms: Secondary | ICD-10-CM

## 2018-07-07 DIAGNOSIS — Z6828 Body mass index (BMI) 28.0-28.9, adult: Secondary | ICD-10-CM

## 2018-07-07 DIAGNOSIS — Z950 Presence of cardiac pacemaker: Secondary | ICD-10-CM

## 2018-07-07 DIAGNOSIS — F329 Major depressive disorder, single episode, unspecified: Secondary | ICD-10-CM

## 2018-07-07 DIAGNOSIS — I10 Essential (primary) hypertension: Secondary | ICD-10-CM

## 2018-07-07 DIAGNOSIS — E785 Hyperlipidemia, unspecified: Secondary | ICD-10-CM | POA: Diagnosis not present

## 2018-07-07 DIAGNOSIS — I495 Sick sinus syndrome: Secondary | ICD-10-CM

## 2018-07-07 DIAGNOSIS — R4589 Other symptoms and signs involving emotional state: Secondary | ICD-10-CM

## 2018-07-07 DIAGNOSIS — R300 Dysuria: Secondary | ICD-10-CM | POA: Diagnosis not present

## 2018-07-07 LAB — CBC
HCT: 43.9 % (ref 39.0–52.0)
Hemoglobin: 14.9 g/dL (ref 13.0–17.0)
MCHC: 34.1 g/dL (ref 30.0–36.0)
MCV: 91.7 fl (ref 78.0–100.0)
Platelets: 171 10*3/uL (ref 150.0–400.0)
RBC: 4.78 Mil/uL (ref 4.22–5.81)
RDW: 13.7 % (ref 11.5–15.5)
WBC: 8.4 10*3/uL (ref 4.0–10.5)

## 2018-07-07 LAB — URINALYSIS, ROUTINE W REFLEX MICROSCOPIC
Bilirubin Urine: NEGATIVE
Ketones, ur: NEGATIVE
Leukocytes, UA: NEGATIVE
Nitrite: NEGATIVE
Specific Gravity, Urine: 1.025 (ref 1.000–1.030)
Total Protein, Urine: NEGATIVE
Urine Glucose: NEGATIVE
Urobilinogen, UA: 0.2 (ref 0.0–1.0)
pH: 5.5 (ref 5.0–8.0)

## 2018-07-07 LAB — LIPID PANEL
Cholesterol: 109 mg/dL (ref 0–200)
HDL: 31 mg/dL — AB (ref >39.00)
LDL Cholesterol: 45 mg/dL (ref 0–99)
NonHDL: 78.28
Total CHOL/HDL Ratio: 4
Triglycerides: 166 mg/dL — ABNORMAL HIGH (ref 0.0–149.0)
VLDL: 33.2 mg/dL (ref 0.0–40.0)

## 2018-07-07 LAB — PSA: PSA: 3.14 ng/mL (ref 0.10–4.00)

## 2018-07-07 LAB — COMPREHENSIVE METABOLIC PANEL
ALT: 22 U/L (ref 0–53)
AST: 15 U/L (ref 0–37)
Albumin: 4.1 g/dL (ref 3.5–5.2)
Alkaline Phosphatase: 64 U/L (ref 39–117)
BUN: 13 mg/dL (ref 6–23)
CO2: 28 meq/L (ref 19–32)
Calcium: 9.3 mg/dL (ref 8.4–10.5)
Chloride: 105 mEq/L (ref 96–112)
Creatinine, Ser: 1.03 mg/dL (ref 0.40–1.50)
GFR: 69.23 mL/min (ref >60.00)
Glucose, Bld: 150 mg/dL — ABNORMAL HIGH (ref 70–99)
POTASSIUM: 3.8 meq/L (ref 3.5–5.1)
Sodium: 141 mEq/L (ref 135–145)
Total Bilirubin: 0.8 mg/dL (ref 0.2–1.2)
Total Protein: 6.8 g/dL (ref 6.0–8.3)

## 2018-07-07 NOTE — Progress Notes (Signed)
Phone [phone]   Subjective:  Shawn [name] is a 84 y.o. year old very pleasant male patient who presents for/with See problem oriented charting ROS-  still with right knee pain- considering replacement. No chest pain or shortness of breath. No headache or blurry vision. Has had some mild intermittent LLQ pain - gassy and feels better with BM.   BMI monitoring- elevated BMI noted: Body mass index is 28.54 kg/m. Encouraged need for healthy eating, regular exercise, weight loss.   BMI Metric Follow Up - 07/07/18 0912      BMI Metric Follow Up-Please document annually   BMI Metric Follow Up  Education provided       Past Medical History-  Patient Active Problem List   Diagnosis Date Noted  . CAD, NATIVE VESSEL 09/27/2008    Priority: High  . PACEMAKER, PERMANENT 03/11/2007    Priority: High  . Atrial fibrillation ([place]) 02/11/2007    Priority: High  . History of adenomatous polyp of colon 09/29/2014    Priority: Medium  . BPH associated with nocturia 09/09/2014    Priority: Medium  . Hyperlipidemia 03/10/2008    Priority: Medium  . Essential hypertension 12/02/2006    Priority: Medium  . Encounter for therapeutic drug monitoring 09/24/2013    Priority: Low  . History of thrombocytopenia 06/17/2011    Priority: Low  . Hx of skin cancer, basal cell 04/07/2008    Priority: Low  . Actinic keratosis 04/07/2008    Priority: Low  . Allergic rhinitis 12/02/2006    Priority: Low  . GERD 12/02/2006    Priority: Low  . Osteoarthritis of right knee 12/19/2017  . Long term (current) use of anticoagulants 02/18/2017    Medications- reviewed and updated Current Outpatient Medications  Medication Sig Dispense Refill  . atorvastatin (LIPITOR) 40 MG tablet TAKE 1 TABLET (40 MG TOTAL) BY MOUTH DAILY AT 6 PM. 90 tablet 2  . CARTIA XT 240 MG 24 hr capsule TAKE 1 CAPSULE EVERY DAY 90 capsule 3  . fluticasone (FLONASE) 50 MCG/ACT nasal spray USE 2 SPRAYS IN EACH NOSTRIL EVERY DAY  48 g 2  . metoprolol succinate (TOPROL-XL) 50 MG 24 hr tablet TAKE 1 AND 1/2 TABLETS ONE TIME DAILY 135 tablet 1  . Multiple Vitamins-Minerals (MULTIVITAMIN & MINERAL PO) Take 1 tablet by mouth daily.    . nitroGLYCERIN (NITROSTAT) 0.4 MG SL tablet Place 1 tablet (0.4 mg total) under the tongue every 5 (five) minutes as needed for chest pain. 25 tablet 1  . omeprazole (PRILOSEC) 20 MG capsule Take 1 capsule (20 mg total) by mouth daily. 90 capsule 3  . tamsulosin (FLOMAX) 0.4 MG CAPS capsule TAKE 2 CAPSULES EVERY DAY 180 capsule 3  . warfarin (COUMADIN) 2.5 MG tablet TAKE 1 TABLET DAILY EXCEPT TAKE 2 TABLETS ON MONDAY, WEDNESDAY, AND FRIDAY OR AS DIRECTED BY THE COUMADIN CLINIC 90 day 135 tablet 1   No current facility-administered medications for this visit.      Objective:  BP 110/60 (BP Location: Left Arm, Patient Position: Sitting, Cuff Size: Large)   Pulse 71   Temp 98.1 F (36.7 C) (Oral)   Ht 5\' 11"  (1.803 m)   Wt 204 lb 9.6 oz (92.8 kg)   SpO2 96%   BMI 28.54 kg/m  Gen: NAD, resting comfortably CV: Regular rate no murmurs rubs or gallops Lungs: CTAB no crackles, wheeze, rhonchi Abdomen: soft/nontender-specifically no left lower quadrant pain/nondistended/normal bowel sounds. No rebound or guarding.  Overweight Ext: Trace edema  Skin: warm, dry Rectal: normal tone, diffusely enlarged prostate, no masses or tenderness    Assessment and Plan   Depressed mood s: Patient admits he is discouraged due to the poor sleep from nocturia issues Depression screen [facility] 2/9 07/07/2018  Decreased Interest 3  Down, Depressed, Hopeless 0  PHQ - 2 Score 3  Altered sleeping 3  Tired, decreased energy 2  Change in appetite 3  Feeling bad or failure about yourself  0  Trouble concentrating 0  Moving slowly or fidgety/restless 2  Suicidal thoughts 0  PHQ-9 Score 13  Difficult doing work/chores Not difficult at all  Some recent data might be hidden  A/P: Depressed mood seems  situational-we will refer to urology to see if anything can be done to help him-we will follow-up at next visit.  Counseling provided today.  Hyperlipidemia S: Reasonably controlled on atorvastatin 40 mg Lab Results  Component Value Date   CHOL 109 07/07/2018   HDL 31.00 (L) 07/07/2018   LDLCALC 45 07/07/2018   LDLDIRECT 54.0 11/21/2015   TRIG 166.0 (H) 07/07/2018   CHOLHDL 4 07/07/2018   A/P:  Stable. Continue current medications.      Essential hypertension S: controlled on Diltiazem 240mg  LA, metoprolol 75mg  XL BP Readings from Last 3 Encounters:  07/07/18 110/60  05/09/18 138/64  04/11/18 140/64  A/P: blood pressure goal of <140/90. Continue current meds: Stable-continue current medication    PACEMAKER, PERMANENT Pacemaker in place-Dr. Lovena Le follows.  History of tachy-brady syndrome   BPH associated with nocturia S: Patient reporting more nocturia starting December 2018 despite being on Flomax.  He would sometimes get up on an hourly basis.  We increased Flomax to 0.8 mg.  UA and urine culture were reassuring.  Rectal exam with BPH only. At that time  We opted not to pursue prostate cancer evaluation given advanced age  He did develop symptoms concerning for urinary tract infection and December 2019-treated with Keflex while pending culture-culture came back negative and we stopped antibiotics after 5 days.  Last week or so has been somewhat better down to 3x a night but still getting some nights where gets up hourly. Slight burning most of the time when he pees. Gets some dribbling of urine.    A/P: 84 year old man with significant and distressing nocturia in light of BPH despite Flomax 0.8 mg.  He feels he cannot sleep well at all he also has hematuria on examination with 3-6 RBCs per high-powered field. - Discussed possibly adding finasteride and he declines for now - I have asked our team to place a referral to urology to discuss BPH issues as well as hematuria  Future  Appointments  Date Time Provider [place]  07/21/2018  2:20 PM [facility] DEVICE REMOTES [facility]  07/23/2018  8:45 AM [facility]  08/08/2018  9:40 AM Marin Olp, MD [facility]  02/12/2019 10:00 AM [facility] HEALTH COACH [facility]  note to staff- "Hey team- looks like patient is scheduled 08/08/2018 with me. I was wondering if he wants to push this out to perhaps 3-4 months which would give him time to be evaluated by urology (see result note for him). "   Lab/Order associations: Not fasting  BPH associated with nocturia  Hyperlipidemia, unspecified hyperlipidemia type - Plan: Comprehensive metabolic panel, CBC, Lipid panel  Dysuria - Plan: Urinalysis, Urine Culture, PSA, Urinalysis  Nocturia - Plan: Urinalysis, Urine Culture, PSA, Urinalysis  Tachy-brady syndrome ([place]), Chronic  Essential hypertension  Depressed mood  BMI 28.0-28.9,adult  PACEMAKER, PERMANENT  Return precautions advised.  Garret Reddish, MD

## 2018-07-07 NOTE — Patient Instructions (Addendum)
Please stop by lab before you go If you do not have mychart- we will call you about results within 5 business days of [place] receiving them.  If you have mychart- we will send your results within 3 business days of [place] receiving them.  If abnormal or we want to clarify a result, we will call or mychart you to make sure you receive the message.  If you have questions or concerns or don't hear within 5-7 days, please send [place] a message or call us.     Team-please let me know when he has had to eat today-I am going to go ahead and update full blood work including for cholesterol  As always-would recommend healthy eating and exercise as able- I know the knee limits you

## 2018-07-08 LAB — URINE CULTURE
MICRO NUMBER:: 142162
SPECIMEN QUALITY:: ADEQUATE

## 2018-07-09 NOTE — Assessment & Plan Note (Signed)
S: controlled on Diltiazem 240mg  LA, metoprolol 75mg  XL BP Readings from Last 3 Encounters:  07/07/18 110/60  05/09/18 138/64  04/11/18 140/64  A/P: blood pressure goal of <140/90. Continue current meds: Stable-continue current medication

## 2018-07-09 NOTE — Assessment & Plan Note (Signed)
S: Reasonably controlled on atorvastatin 40 mg Lab Results  Component Value Date   CHOL 109 07/07/2018   HDL 31.00 (L) 07/07/2018   LDLCALC 45 07/07/2018   LDLDIRECT 54.0 11/21/2015   TRIG 166.0 (H) 07/07/2018   CHOLHDL 4 07/07/2018   A/P:  Stable. Continue current medications.

## 2018-07-09 NOTE — Assessment & Plan Note (Signed)
Pacemaker in [place] follows.  History of tachy-brady syndrome

## 2018-07-09 NOTE — Assessment & Plan Note (Signed)
S: Patient reporting more nocturia starting December 2018 despite being on Flomax.  He would sometimes get up on an hourly basis.  We increased Flomax to 0.8 mg.  UA and urine culture were reassuring.  Rectal exam with BPH only. At that time  We opted not to pursue prostate cancer evaluation given advanced age  He did develop symptoms concerning for urinary tract infection and December 2019-treated with Keflex while pending culture-culture came back negative and we stopped antibiotics after 5 days.  Last week or so has been somewhat better down to 3x a night but still getting some nights where gets up hourly. Slight burning most of the time when he pees. Gets some dribbling of urine.    A/P: 84 year old man with significant and distressing nocturia in light of BPH despite Flomax 0.8 mg.  He feels he cannot sleep well at all he also has hematuria on examination with 3-6 RBCs per high-powered field. - Discussed possibly adding finasteride and he declines for now - I have asked our team to place a referral to urology to discuss BPH issues as well as hematuria

## 2018-07-10 ENCOUNTER — Telehealth: Payer: Self-pay

## 2018-07-10 ENCOUNTER — Other Ambulatory Visit: Payer: Self-pay

## 2018-07-10 DIAGNOSIS — R319 Hematuria, unspecified: Secondary | ICD-10-CM

## 2018-07-10 NOTE — Telephone Encounter (Signed)
-----   Message from Marin Olp, MD sent at 07/09/2018  8:45 PM EST ----- Hey team- looks like patient is scheduled 08/08/2018 with me. I was wondering if he wants to push this out to perhaps 3-4 months which would give him time to be evaluated by urology (see result note for him).   Thanks for chatting with him about this,  Garret Reddish

## 2018-07-10 NOTE — Telephone Encounter (Signed)
Called patient and left a detailed voicemail message asking patient to return my call and see about rescheduling his appointment in 3-4 months in stead of next month. That way he can hopefully have his evaluation done by Urology first.

## 2018-07-14 NOTE — Progress Notes (Signed)
Since seeing urology- no further repeat needed

## 2018-07-21 ENCOUNTER — Ambulatory Visit (INDEPENDENT_AMBULATORY_CARE_PROVIDER_SITE_OTHER): Payer: Medicare HMO

## 2018-07-21 DIAGNOSIS — I495 Sick sinus syndrome: Secondary | ICD-10-CM | POA: Diagnosis not present

## 2018-07-22 LAB — CUP PACEART REMOTE DEVICE CHECK
Battery Impedance: 844 Ohm
Battery Remaining Longevity: 69 mo
Battery Voltage: 2.77 V
Brady Statistic RV Percent Paced: 67 %
Date Time Interrogation Session: 20200217150735
Implantable Lead Implant Date: 20081007
Implantable Lead Implant Date: 20081007
Implantable Lead Location: 753859
Implantable Lead Location: 753860
Implantable Lead Model: 5076
Implantable Lead Model: 5076
Implantable Pulse Generator Implant Date: 20160714
Lead Channel Impedance Value: 0 Ohm
Lead Channel Impedance Value: 515 Ohm
Lead Channel Pacing Threshold Amplitude: 0.625 V
Lead Channel Setting Pacing Amplitude: 2 V
Lead Channel Setting Pacing Pulse Width: 0.4 ms
Lead Channel Setting Sensing Sensitivity: 1.4 mV
MDC IDC MSMT LEADCHNL RV PACING THRESHOLD PULSEWIDTH: 0.4 ms

## 2018-07-23 ENCOUNTER — Ambulatory Visit (INDEPENDENT_AMBULATORY_CARE_PROVIDER_SITE_OTHER): Payer: Medicare HMO | Admitting: General Practice

## 2018-07-23 DIAGNOSIS — I4891 Unspecified atrial fibrillation: Secondary | ICD-10-CM

## 2018-07-23 DIAGNOSIS — Z7901 Long term (current) use of anticoagulants: Secondary | ICD-10-CM | POA: Diagnosis not present

## 2018-07-23 LAB — POCT INR: INR: 2.8 (ref 2.0–3.0)

## 2018-07-23 NOTE — Progress Notes (Signed)
I have reviewed this visit and I agree on the patient's plan of dosage and recommendations. Madlynn Lundeen, DO   

## 2018-07-23 NOTE — Patient Instructions (Addendum)
Pre visit review using our clinic review tool, if applicable. No additional management support is needed unless otherwise documented below in the visit note.  Continue to take 1 tablet daily except 2 tablets on Monday, Wed, and Fridays.  Re-check in 3 or 4 weeks.

## 2018-07-30 NOTE — Progress Notes (Signed)
Remote pacemaker transmission.   

## 2018-08-05 ENCOUNTER — Encounter: Payer: Self-pay | Admitting: Physician Assistant

## 2018-08-05 ENCOUNTER — Ambulatory Visit (INDEPENDENT_AMBULATORY_CARE_PROVIDER_SITE_OTHER): Payer: Medicare HMO | Admitting: Physician Assistant

## 2018-08-05 VITALS — BP 120/66 | HR 81 | Temp 98.2°F | Ht 71.0 in | Wt 204.5 lb

## 2018-08-05 DIAGNOSIS — Z7901 Long term (current) use of anticoagulants: Secondary | ICD-10-CM

## 2018-08-05 DIAGNOSIS — J0110 Acute frontal sinusitis, unspecified: Secondary | ICD-10-CM

## 2018-08-05 MED ORDER — DOXYCYCLINE HYCLATE 100 MG PO TABS: 100.0000 mg | ORAL_TABLET | Freq: Two times a day (BID) | ORAL | 0 refills | Status: DC

## 2018-08-05 NOTE — Patient Instructions (Signed)
It was great to see you!  Start doxycycline.  If you have worsening bleeding please seek medical attention.  Please schedule a coumadin with Cindy next week.  Push fluids and get plenty of rest. Please return if you are not improving as expected, or if you have high fevers (>101.5) or difficulty swallowing or worsening productive cough.  Call clinic with questions.  I hope you start feeling better soon!

## 2018-08-05 NOTE — Progress Notes (Signed)
Shawn [name] is a 84 y.o. male here for a new problem.  I acted as a Education administrator for [employer], PA-C Anselmo Pickler, LPN  History of Present Illness:   Chief Complaint  Patient presents with  . Cough    Cough  This is a new problem. Episode onset: Started a week ago. The problem has been gradually worsening. The problem occurs every few hours. The cough is productive of sputum (expectorating yellow sputum). Associated symptoms include chills, headaches, nasal congestion (yellow) and postnasal drip. Pertinent negatives include no sore throat or shortness of breath. Treatments tried: allergy med, Nyquil. The treatment provided mild relief. There is no history of asthma, bronchitis, COPD or pneumonia.    Past Medical History:  Diagnosis Date  . Actinic keratosis   . Adenomatous colon polyp   . ALLERGIC RHINITIS   . Arthritis   . CAD, NATIVE VESSEL    a. DES x2 to RCA 2004 b. repeat PTCA to RCA 2008 c. PCI to RCA 2012 non-DES   . Cellulitis and abscess of trunk   . Diverticulosis   . GERD (gastroesophageal reflux disease)   . HYPERLIPIDEMIA   . HYPERTENSION   . OTH MALIG NEOPLASM SKIN OTH\T\UNSPEC PARTS FACE   . Permanent atrial fibrillation   . Skin cancer   . Tachy-brady syndrome ([place])    a. s/p MDT dual chamber pacemaker implant complicated by pericardial effusion and micro-perforatation s/p lead revisions     Social History   Socioeconomic History  . Marital status: Married    Spouse name: Not on file  . Number of children: 2  . Years of education: Not on file  . Highest education level: Not on file  Occupational History  . Occupation: retired    Comment: [employer]   Social Needs  . Financial resource strain: Not on file  . Food insecurity:    Worry: Not on file    Inability: Not on file  . Transportation needs:    Medical: Not on file    Non-medical: Not on file  Tobacco Use  . Smoking status: Never Smoker  . Smokeless tobacco: Never Used  Substance and  Sexual Activity  . Alcohol use: No    Comment: 05/14/11 "last occasional drink was in 1990's"  . Drug use: No  . Sexual activity: Yes  Lifestyle  . Physical activity:    Days per week: Not on file    Minutes per session: Not on file  . Stress: Not on file  Relationships  . Social connections:    Talks on phone: Not on file    Gets together: Not on file    Attends religious service: Not on file    Active member of club or organization: Not on file    Attends meetings of clubs or organizations: Not on file    Relationship status: Not on file  . Intimate partner violence:    Fear of current or ex partner: Not on file    Emotionally abused: Not on file    Physically abused: Not on file    Forced sexual activity: Not on file  Other Topics Concern  . Not on file  Social History Narrative   Widowed March 2016. 2 sons. 4 grandkids- all through college. 1 greatgrandchild on way 11/2015.    Lives by himself.       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       Hobbies: play golf (slowing down due  to right knee), enjoys maintaining the home and [place]- [place]    Past Surgical History:  Procedure Laterality Date  . BACK SURGERY  2000   "for bulging discs"  . CATARACT EXTRACTION W/ INTRAOCULAR LENS  IMPLANT, BILATERAL  2012  . CORONARY ANGIOPLASTY WITH STENT PLACEMENT  2008  . CORONARY ANGIOPLASTY WITH STENT PLACEMENT  05/14/11  . CYSTECTOMY     from right side of face.  . EP IMPLANTABLE DEVICE N/A 12/16/2014   Procedure: PPM Generator Changeout;  Surgeon: Evans Lance, MD;  Location: [place] [facility];  Service: Cardiovascular;  Laterality: N/A;  . LEAD REVISION  2008   11 days post initial implant, RA and RV lead revisions for micro-perforation and pericardial effusion by Dr Lovena Le  . LEFT HEART CATHETERIZATION WITH CORONARY ANGIOGRAM N/A 05/14/2011   Procedure: LEFT HEART CATHETERIZATION WITH CORONARY ANGIOGRAM;  Surgeon: Hillary Bow, MD;  Location:  [facility] C[facility];  Service: Cardiovascular;  Laterality: N/A;  . PACEMAKER INSERTION  2008   MDT dual chamber pacemaker implanted by Dr Percival Spanish    Family History  Problem Relation Age of Onset  . Ovarian cancer Mother   . Heart attack Father   . Colon cancer Father 73       deceased 66  . Diabetes Brother        x 2  . Heart disease Brother        x 3    No Known Allergies  Current Medications:   Current Outpatient Medications:  .  atorvastatin (LIPITOR) 40 MG tablet, TAKE 1 TABLET (40 MG TOTAL) BY MOUTH DAILY AT 6 PM., Disp: 90 tablet, Rfl: 2 .  CARTIA XT 240 MG 24 hr capsule, TAKE 1 CAPSULE EVERY DAY, Disp: 90 capsule, Rfl: 3 .  fluticasone (FLONASE) 50 MCG/ACT nasal spray, USE 2 SPRAYS IN EACH NOSTRIL EVERY DAY, Disp: 48 g, Rfl: 2 .  metoprolol succinate (TOPROL-XL) 50 MG 24 hr tablet, TAKE 1 AND 1/2 TABLETS ONE TIME DAILY, Disp: 135 tablet, Rfl: 1 .  Multiple Vitamins-Minerals (MULTIVITAMIN & MINERAL PO), Take 1 tablet by mouth daily., Disp: , Rfl:  .  nitroGLYCERIN (NITROSTAT) 0.4 MG SL tablet, Place 1 tablet (0.4 mg total) under the tongue every 5 (five) minutes as needed for chest pain., Disp: 25 tablet, Rfl: 1 .  omeprazole (PRILOSEC) 20 MG capsule, Take 1 capsule (20 mg total) by mouth daily., Disp: 90 capsule, Rfl: 3 .  tamsulosin (FLOMAX) 0.4 MG CAPS capsule, TAKE 2 CAPSULES EVERY DAY, Disp: 180 capsule, Rfl: 3 .  warfarin (COUMADIN) 2.5 MG tablet, TAKE 1 TABLET DAILY EXCEPT TAKE 2 TABLETS ON MONDAY, WEDNESDAY, AND FRIDAY OR AS DIRECTED BY THE COUMADIN CLINIC 90 day, Disp: 135 tablet, Rfl: 1 .  doxycycline (VIBRA-TABS) 100 MG tablet, Take 1 tablet (100 mg total) by mouth 2 (two) times daily., Disp: 20 tablet, Rfl: 0   Review of Systems:   Review of Systems  Constitutional: Positive for chills.  HENT: Positive for postnasal drip. Negative for sore throat.   Respiratory: Positive for cough. Negative for shortness of breath.   Neurological: Positive for headaches.     Vitals:   Vitals:   08/05/18 1249  BP: 120/66  Pulse: 81  Temp: 98.2 F (36.8 C)  TempSrc: Oral  SpO2: 96%  Weight: 204 lb 8 oz (92.8 kg)  Height: 5\' 11"  (1.803 m)     Body mass index is 28.52 kg/m.  Physical Exam:   Physical Exam Vitals  signs and nursing note reviewed.  Constitutional:      General: He is not in acute distress.    Appearance: He is well-developed. He is not ill-appearing or toxic-appearing.  HENT:     Head: Normocephalic and atraumatic.     Right Ear: Tympanic membrane, ear canal and external ear normal. Tympanic membrane is not erythematous, retracted or bulging.     Left Ear: Ear canal and external ear normal. There is impacted cerumen.     Ears:     Comments: Unable to see L TM 2/2 cerumen    Nose: Mucosal edema, congestion and rhinorrhea present.     Right Sinus: Frontal sinus tenderness present. No maxillary sinus tenderness.     Left Sinus: Frontal sinus tenderness present. No maxillary sinus tenderness.     Mouth/Throat:     Lips: Pink.     Mouth: Mucous membranes are moist.     Pharynx: Uvula midline. Posterior oropharyngeal erythema present.     Tonsils: Swelling: 1+ on the right. 1+ on the left.  Eyes:     General: Lids are normal.     Conjunctiva/sclera: Conjunctivae normal.  Neck:     Trachea: Trachea normal.  Cardiovascular:     Rate and Rhythm: Normal rate and regular rhythm.     Heart sounds: Normal heart sounds, S1 normal and S2 normal.  Pulmonary:     Effort: Pulmonary effort is normal.     Breath sounds: Normal breath sounds. No decreased breath sounds, wheezing, rhonchi or rales.  Lymphadenopathy:     Cervical: No cervical adenopathy.  Skin:    General: Skin is warm and dry.  Neurological:     Mental Status: He is alert.  Psychiatric:        Speech: Speech normal.        Behavior: Behavior normal. Behavior is cooperative.     Results for orders placed or performed in visit on 07/23/18  POCT INR  Result Value Ref  Range   INR 2.8 2.0 - 3.0    Assessment and Plan:   Adolphus was seen today for cough.  Diagnoses and all orders for this visit:  Acute non-recurrent frontal sinusitis -     POC Influenza A&B(BINAX/QUICKVUE)  Long term (current) use of anticoagulants  Other orders -     doxycycline (VIBRA-TABS) 100 MG tablet; Take 1 tablet (100 mg total) by mouth 2 (two) times daily.   No red flags on exam.  Will initiate doxycycline per orders. Did discuss risk of altered INR with this medication and need for INR re-check in one week. Patient verbalized understanding however he did state that he would be out of town. Discussed risks and encouraged him to seek urgent medical attention if any issues with bleeding or other concerns. Discussed taking medications as prescribed. Reviewed return precautions including worsening fever, SOB, worsening cough or other concerns. Push fluids and rest. I recommend that patient follow-up if symptoms worsen or persist despite treatment x 7-10 days, sooner if needed.   . Reviewed expectations re: course of current medical issues. . Discussed self-management of symptoms. . Outlined signs and symptoms indicating need for more acute intervention. . Patient verbalized understanding and all questions were answered. . See orders for this visit as documented in the electronic medical record. . Patient received an After-Visit Summary.  CMA or LPN served as scribe during this visit. History, Physical, and Plan performed by medical provider. The above documentation has been reviewed and is accurate and  complete.   Inda Coke, PA-C

## 2018-08-08 ENCOUNTER — Ambulatory Visit: Payer: Medicare HMO | Admitting: Family Medicine

## 2018-08-20 ENCOUNTER — Ambulatory Visit (INDEPENDENT_AMBULATORY_CARE_PROVIDER_SITE_OTHER): Payer: Medicare HMO | Admitting: General Practice

## 2018-08-20 ENCOUNTER — Other Ambulatory Visit: Payer: Self-pay

## 2018-08-20 DIAGNOSIS — Z7901 Long term (current) use of anticoagulants: Secondary | ICD-10-CM

## 2018-08-20 DIAGNOSIS — I4891 Unspecified atrial fibrillation: Secondary | ICD-10-CM

## 2018-08-20 LAB — POCT INR: INR: 3.8 — AB (ref 2.0–3.0)

## 2018-08-20 NOTE — Progress Notes (Signed)
I have reviewed and agree with note, evaluation, plan.   Stephen Hunter, MD  

## 2018-08-20 NOTE — Patient Instructions (Addendum)
Pre visit review using our clinic review tool, if applicable. No additional management support is needed unless otherwise documented below in the visit note.  Hold coumadin today (3/18) and take 1 tablet (3/19) and then continue to take 1 tablet daily except 2 tablets on Monday, Wed, and Fridays.  Re-check in 3 or 4 weeks.

## 2018-08-21 ENCOUNTER — Other Ambulatory Visit: Payer: Self-pay | Admitting: Family Medicine

## 2018-08-21 DIAGNOSIS — N401 Enlarged prostate with lower urinary tract symptoms: Secondary | ICD-10-CM | POA: Diagnosis not present

## 2018-08-21 DIAGNOSIS — R351 Nocturia: Secondary | ICD-10-CM | POA: Diagnosis not present

## 2018-08-21 DIAGNOSIS — R311 Benign essential microscopic hematuria: Secondary | ICD-10-CM | POA: Diagnosis not present

## 2018-09-08 ENCOUNTER — Encounter: Payer: Self-pay | Admitting: Family Medicine

## 2018-09-08 DIAGNOSIS — R311 Benign essential microscopic hematuria: Secondary | ICD-10-CM | POA: Diagnosis not present

## 2018-09-09 LAB — PSA: PSA: 2.8

## 2018-09-17 ENCOUNTER — Other Ambulatory Visit: Payer: Self-pay

## 2018-09-17 ENCOUNTER — Ambulatory Visit (INDEPENDENT_AMBULATORY_CARE_PROVIDER_SITE_OTHER): Payer: Medicare HMO | Admitting: General Practice

## 2018-09-17 DIAGNOSIS — Z7901 Long term (current) use of anticoagulants: Secondary | ICD-10-CM

## 2018-09-17 DIAGNOSIS — I4891 Unspecified atrial fibrillation: Secondary | ICD-10-CM

## 2018-09-17 LAB — POCT INR: INR: 4.1 — AB (ref 2.0–3.0)

## 2018-09-17 NOTE — Progress Notes (Signed)
I have reviewed and agree with note, evaluation, plan.   Stephen Hunter, MD  

## 2018-09-17 NOTE — Patient Instructions (Signed)
Pre visit review using our clinic review tool, if applicable. No additional management support is needed unless otherwise documented below in the visit note.  Hold coumadin today (4/15) and tomorrow (4/16) and then change dosage and take 1 tablet daily except 2 tablets on Monday and Fridays.  Re-check in 3 weeks.

## 2018-09-23 ENCOUNTER — Encounter: Payer: Self-pay | Admitting: Family Medicine

## 2018-09-24 ENCOUNTER — Other Ambulatory Visit: Payer: Self-pay | Admitting: Family Medicine

## 2018-09-29 DIAGNOSIS — R3121 Asymptomatic microscopic hematuria: Secondary | ICD-10-CM | POA: Diagnosis not present

## 2018-09-29 DIAGNOSIS — R311 Benign essential microscopic hematuria: Secondary | ICD-10-CM | POA: Diagnosis not present

## 2018-10-02 DIAGNOSIS — R311 Benign essential microscopic hematuria: Secondary | ICD-10-CM | POA: Insufficient documentation

## 2018-10-02 DIAGNOSIS — N401 Enlarged prostate with lower urinary tract symptoms: Secondary | ICD-10-CM | POA: Insufficient documentation

## 2018-10-02 DIAGNOSIS — R351 Nocturia: Secondary | ICD-10-CM | POA: Insufficient documentation

## 2018-10-08 ENCOUNTER — Ambulatory Visit: Payer: Medicare HMO

## 2018-10-13 ENCOUNTER — Ambulatory Visit: Payer: Medicare HMO | Admitting: Family Medicine

## 2018-10-17 ENCOUNTER — Encounter: Payer: Self-pay | Admitting: Family Medicine

## 2018-10-17 ENCOUNTER — Encounter: Payer: Self-pay | Admitting: Physical Therapy

## 2018-10-20 ENCOUNTER — Ambulatory Visit (INDEPENDENT_AMBULATORY_CARE_PROVIDER_SITE_OTHER): Payer: Medicare HMO | Admitting: *Deleted

## 2018-10-20 ENCOUNTER — Other Ambulatory Visit: Payer: Self-pay

## 2018-10-20 DIAGNOSIS — I495 Sick sinus syndrome: Secondary | ICD-10-CM

## 2018-10-21 LAB — CUP PACEART REMOTE DEVICE CHECK
Battery Impedance: 894 Ohm
Battery Remaining Longevity: 67 mo
Battery Voltage: 2.77 V
Brady Statistic RV Percent Paced: 69 %
Date Time Interrogation Session: 20200518135949
Implantable Lead Implant Date: 20081007
Implantable Lead Implant Date: 20081007
Implantable Lead Location: 753859
Implantable Lead Location: 753860
Implantable Lead Model: 5076
Implantable Lead Model: 5076
Implantable Pulse Generator Implant Date: 20160714
Lead Channel Impedance Value: 0 Ohm
Lead Channel Impedance Value: 485 Ohm
Lead Channel Pacing Threshold Amplitude: 0.625 V
Lead Channel Pacing Threshold Pulse Width: 0.4 ms
Lead Channel Setting Pacing Amplitude: 2 V
Lead Channel Setting Pacing Pulse Width: 0.4 ms
Lead Channel Setting Sensing Sensitivity: 1.4 mV

## 2018-10-28 ENCOUNTER — Ambulatory Visit (INDEPENDENT_AMBULATORY_CARE_PROVIDER_SITE_OTHER): Payer: Medicare HMO | Admitting: Family Medicine

## 2018-10-28 ENCOUNTER — Other Ambulatory Visit: Payer: Self-pay

## 2018-10-28 ENCOUNTER — Encounter: Payer: Self-pay | Admitting: Family Medicine

## 2018-10-28 VITALS — BP 123/70 | HR 78 | Temp 97.9°F | Ht 71.0 in | Wt 199.6 lb

## 2018-10-28 DIAGNOSIS — I10 Essential (primary) hypertension: Secondary | ICD-10-CM

## 2018-10-28 DIAGNOSIS — R351 Nocturia: Secondary | ICD-10-CM

## 2018-10-28 DIAGNOSIS — I2510 Atherosclerotic heart disease of native coronary artery without angina pectoris: Secondary | ICD-10-CM

## 2018-10-28 DIAGNOSIS — I700 Atherosclerosis of aorta: Secondary | ICD-10-CM | POA: Diagnosis not present

## 2018-10-28 DIAGNOSIS — M1711 Unilateral primary osteoarthritis, right knee: Secondary | ICD-10-CM

## 2018-10-28 DIAGNOSIS — N401 Enlarged prostate with lower urinary tract symptoms: Secondary | ICD-10-CM | POA: Diagnosis not present

## 2018-10-28 DIAGNOSIS — E785 Hyperlipidemia, unspecified: Secondary | ICD-10-CM | POA: Diagnosis not present

## 2018-10-28 DIAGNOSIS — I4891 Unspecified atrial fibrillation: Secondary | ICD-10-CM

## 2018-10-28 NOTE — Progress Notes (Signed)
Phone [phone]   Subjective:  Shawn [name] is a 84 y.o. year old very pleasant male patient who presents for/with See problem oriented charting Chief Complaint  Patient presents with  . Hypertension   ROS- No chest pain or shortness of breath. No headache or blurry vision.  covid screen- No fever, chills, cough, shortness of breath, body aches, sore throat, or loss of taste or smell  Past Medical History-  Patient Active Problem List   Diagnosis Date Noted  . CAD, NATIVE VESSEL 09/27/2008    Priority: High  . PACEMAKER, PERMANENT 03/11/2007    Priority: High  . Atrial fibrillation ([place]) 02/11/2007    Priority: High  . History of adenomatous polyp of colon 09/29/2014    Priority: Medium  . BPH associated with nocturia 09/09/2014    Priority: Medium  . Hyperlipidemia 03/10/2008    Priority: Medium  . Essential hypertension 12/02/2006    Priority: Medium  . Encounter for therapeutic drug monitoring 09/24/2013    Priority: Low  . History of thrombocytopenia 06/17/2011    Priority: Low  . Hx of skin cancer, basal cell 04/07/2008    Priority: Low  . Actinic keratosis 04/07/2008    Priority: Low  . Allergic rhinitis 12/02/2006    Priority: Low  . GERD 12/02/2006    Priority: Low  . Aortic atherosclerosis ([place]) 10/28/2018  . Osteoarthritis of right knee 12/19/2017  . Long term (current) use of anticoagulants 02/18/2017    Medications- reviewed and updated Current Outpatient Medications  Medication Sig Dispense Refill  . atorvastatin (LIPITOR) 40 MG tablet TAKE 1 TABLET (40 MG TOTAL) BY MOUTH DAILY AT 6 PM. 90 tablet 2  . diltiazem (CARDIZEM CD) 240 MG 24 hr capsule TAKE 1 CAPSULE EVERY DAY 90 capsule 3  . finasteride (PROSCAR) 5 MG tablet     . fluticasone (FLONASE) 50 MCG/ACT nasal spray USE 2 SPRAYS IN EACH NOSTRIL EVERY DAY 48 g 2  . metoprolol succinate (TOPROL-XL) 50 MG 24 hr tablet TAKE 1 AND 1/2 TABLETS ONE TIME DAILY 135 tablet 1  . Multiple  Vitamins-Minerals (MULTIVITAMIN & MINERAL PO) Take 1 tablet by mouth daily.    . nitroGLYCERIN (NITROSTAT) 0.4 MG SL tablet Place 1 tablet (0.4 mg total) under the tongue every 5 (five) minutes as needed for chest pain. 25 tablet 1  . omeprazole (PRILOSEC) 20 MG capsule Take 1 capsule (20 mg total) by mouth daily. 90 capsule 3  . tamsulosin (FLOMAX) 0.4 MG CAPS capsule TAKE 2 CAPSULES EVERY DAY 180 capsule 3  . warfarin (COUMADIN) 2.5 MG tablet TAKE 1 TABLET DAILY EXCEPT TAKE 2 TABLETS ON MONDAY, WEDNESDAY, AND FRIDAY OR AS DIRECTED BY THE COUMADIN CLINIC 135 tablet 1   No current facility-administered medications for this visit.      Objective:  BP 123/70   Pulse 78   Temp 97.9 F (36.6 C) (Oral)   Ht 5\' 11"  (1.803 m)   Wt 199 lb 9.6 oz (90.5 kg)   SpO2 96%   BMI 27.84 kg/m  Gen: NAD, resting comfortably CV: RRR  Lungs: nonlabored, normal respiratory rate Abdomen: soft/nondistended Skin: warm, dry Neuro: grossly normal, moves all extremities, normal gait    Assessment and Plan   #hyperlipidemia/ aortic atherosclerosis/CAD S: Compliant with aspirin for CAD.  Also on statin for hyperlipidemia-controlled on atorvastatin 40mg   Aortic atherosclerosis was noted on CT scan with urology-patient shows me report Lab Results  Component Value Date   CHOL 109 07/07/2018   HDL  31.00 (L) 07/07/2018   LDLCALC 45 07/07/2018   LDLDIRECT 54.0 11/21/2015   TRIG 166.0 (H) 07/07/2018   CHOLHDL 4 07/07/2018   A/P: Stable lipids and CAD. Continue current medications. LDL at ideal goal under 70 For aortic atherosclerosis-recommended continuing risk factor modification  #hypertension S: controlled on diltiazem 250mg  XR, metoprolol 75 mg XR. HR in normal range BP Readings from Last 3 Encounters:  10/28/18 123/70  08/05/18 120/66  07/07/18 110/60  A/P: Stable. Continue current medications.   # BPH S: compliant with finasteride (not much help yet) and flomax 0.8 mg. phq9 of 5 today- patient  states his prior down mood has improved with hope of nocturia getting better in coming months on finasteride/as given by urology.  Prostatomegaly was noted on CT scan with urology-patient shows me report A/P: stable symptoms but patient optimistic about potential improvements on finasteride- continue current course  #Atrial fibrillation S: Anticoagulated with Coumadin.  Rate control with diltiazem and metoprolol A/P: Stable-continue current medications.  Offered phlebotomy to update INR today but he would prefer to come back to see [facility] tomorrow for point-of-care testing.  Osteoarthritis of right knee Patient reports prior injection was helpful for a month or less- he would like to see orthopedics or sports medicine but would prefer to see how COVID-19 pandemic plays out-happy to refer him if needed in the future  4 to 27-month physical advised Future Appointments  Date Time Provider [place]  10/29/2018  9:30 AM [facility]  02/12/2019 10:00 AM [facility] HEALTH COACH [facility]   Lab/Order associations: Atherosclerosis of native coronary artery of native heart without angina pectoris  Atrial fibrillation, unspecified type ([place])  Hyperlipidemia, unspecified hyperlipidemia type  Essential hypertension  BPH associated with nocturia  Primary osteoarthritis of right knee  Aortic atherosclerosis ([place])  Return precautions advised.  Garret Reddish, MD

## 2018-10-28 NOTE — Assessment & Plan Note (Signed)
Patient reports prior injection was helpful for a month or less- he would like to see orthopedics or sports medicine but would prefer to see how COVID-19 pandemic plays out-happy to refer him if needed in the future

## 2018-10-28 NOTE — Patient Instructions (Addendum)
Great to see you today!  No changes today- hoping that finasteride will start helping you in coming months!   You can schedule a physical for 4-6 months before you leave if you would like.   Stay safe out there!

## 2018-10-29 ENCOUNTER — Ambulatory Visit (INDEPENDENT_AMBULATORY_CARE_PROVIDER_SITE_OTHER): Payer: Medicare HMO | Admitting: General Practice

## 2018-10-29 DIAGNOSIS — I4891 Unspecified atrial fibrillation: Secondary | ICD-10-CM

## 2018-10-29 DIAGNOSIS — Z7901 Long term (current) use of anticoagulants: Secondary | ICD-10-CM

## 2018-10-29 LAB — POCT INR: INR: 3.5 — AB (ref 2.0–3.0)

## 2018-10-29 NOTE — Progress Notes (Signed)
I have reviewed and agree with note, evaluation, plan.   Saahas Hidrogo, MD  

## 2018-10-29 NOTE — Patient Instructions (Addendum)
Pre visit review using our clinic review tool, if applicable. No additional management support is needed unless otherwise documented below in the visit note.  Hold coumadin tomorrow (5/28) and then take 1 tablet daily except 2 tablets on Monday and Fridays.  Re-check in 3 weeks.

## 2018-10-30 ENCOUNTER — Encounter: Payer: Self-pay | Admitting: Cardiology

## 2018-10-30 NOTE — Progress Notes (Signed)
Remote pacemaker transmission.   

## 2018-11-03 ENCOUNTER — Other Ambulatory Visit: Payer: Self-pay | Admitting: Family Medicine

## 2018-11-19 ENCOUNTER — Other Ambulatory Visit: Payer: Self-pay

## 2018-11-19 ENCOUNTER — Ambulatory Visit (INDEPENDENT_AMBULATORY_CARE_PROVIDER_SITE_OTHER): Payer: Medicare HMO | Admitting: General Practice

## 2018-11-19 DIAGNOSIS — I4891 Unspecified atrial fibrillation: Secondary | ICD-10-CM

## 2018-11-19 DIAGNOSIS — Z7901 Long term (current) use of anticoagulants: Secondary | ICD-10-CM

## 2018-11-19 LAB — POCT INR: INR: 3 (ref 2.0–3.0)

## 2018-11-19 NOTE — Patient Instructions (Addendum)
Pre visit review using our clinic review tool, if applicable. No additional management support is needed unless otherwise documented below in the visit note.  Continue to take 1 tablet daily except 2 tablets on Monday and Fridays.  Re-check in 4 weeks.

## 2018-12-17 ENCOUNTER — Ambulatory Visit (INDEPENDENT_AMBULATORY_CARE_PROVIDER_SITE_OTHER): Payer: Medicare HMO | Admitting: General Practice

## 2018-12-17 ENCOUNTER — Other Ambulatory Visit: Payer: Self-pay

## 2018-12-17 DIAGNOSIS — Z7901 Long term (current) use of anticoagulants: Secondary | ICD-10-CM

## 2018-12-17 LAB — POCT INR: INR: 3.5 — AB (ref 2.0–3.0)

## 2018-12-17 NOTE — Patient Instructions (Addendum)
Pre visit review using our clinic review tool, if applicable. No additional management support is needed unless otherwise documented below in the visit note.  Skip coumadin today (7/15) and then change dosage and take 1 tablet daily except 2 tablets on Mondays only. Re-check in 4 weeks.

## 2019-01-14 ENCOUNTER — Other Ambulatory Visit: Payer: Self-pay

## 2019-01-14 ENCOUNTER — Ambulatory Visit (INDEPENDENT_AMBULATORY_CARE_PROVIDER_SITE_OTHER): Payer: Medicare HMO | Admitting: General Practice

## 2019-01-14 DIAGNOSIS — I4891 Unspecified atrial fibrillation: Secondary | ICD-10-CM

## 2019-01-14 DIAGNOSIS — Z7901 Long term (current) use of anticoagulants: Secondary | ICD-10-CM

## 2019-01-14 LAB — POCT INR: INR: 2.5 (ref 2.0–3.0)

## 2019-01-14 NOTE — Patient Instructions (Addendum)
Pre visit review using our clinic review tool, if applicable. No additional management support is needed unless otherwise documented below in the visit note.  Take 1 tablet daily except 2 tablets on Mondays only. Re-check in 5 weeks.

## 2019-01-15 NOTE — Progress Notes (Signed)
I have reviewed and agree with note, evaluation, plan.   Camdyn Laden, MD  

## 2019-01-19 ENCOUNTER — Ambulatory Visit (INDEPENDENT_AMBULATORY_CARE_PROVIDER_SITE_OTHER): Payer: Medicare HMO | Admitting: *Deleted

## 2019-01-19 DIAGNOSIS — I4891 Unspecified atrial fibrillation: Secondary | ICD-10-CM

## 2019-01-19 LAB — CUP PACEART REMOTE DEVICE CHECK
Battery Impedance: 1102 Ohm
Battery Remaining Longevity: 59 mo
Battery Voltage: 2.77 V
Brady Statistic RV Percent Paced: 71 %
Date Time Interrogation Session: 20200817144758
Implantable Lead Implant Date: 20081007
Implantable Lead Implant Date: 20081007
Implantable Lead Location: 753859
Implantable Lead Location: 753860
Implantable Lead Model: 5076
Implantable Lead Model: 5076
Implantable Pulse Generator Implant Date: 20160714
Lead Channel Impedance Value: 0 Ohm
Lead Channel Impedance Value: 547 Ohm
Lead Channel Pacing Threshold Amplitude: 0.5 V
Lead Channel Pacing Threshold Pulse Width: 0.4 ms
Lead Channel Setting Pacing Amplitude: 2 V
Lead Channel Setting Pacing Pulse Width: 0.4 ms
Lead Channel Setting Sensing Sensitivity: 1.4 mV

## 2019-01-22 ENCOUNTER — Other Ambulatory Visit: Payer: Self-pay | Admitting: Family Medicine

## 2019-01-22 DIAGNOSIS — Z7901 Long term (current) use of anticoagulants: Secondary | ICD-10-CM

## 2019-01-22 NOTE — Telephone Encounter (Signed)
Okay for refill next appt. Is 02/18/2019

## 2019-01-27 NOTE — Progress Notes (Signed)
Remote pacemaker transmission.   

## 2019-02-12 ENCOUNTER — Ambulatory Visit: Payer: Medicare HMO

## 2019-02-18 ENCOUNTER — Ambulatory Visit (INDEPENDENT_AMBULATORY_CARE_PROVIDER_SITE_OTHER): Payer: Medicare HMO | Admitting: General Practice

## 2019-02-18 ENCOUNTER — Other Ambulatory Visit: Payer: Self-pay

## 2019-02-18 DIAGNOSIS — Z7901 Long term (current) use of anticoagulants: Secondary | ICD-10-CM

## 2019-02-18 DIAGNOSIS — I4891 Unspecified atrial fibrillation: Secondary | ICD-10-CM

## 2019-02-18 LAB — POCT INR: INR: 2.5 (ref 2.0–3.0)

## 2019-02-18 NOTE — Patient Instructions (Addendum)
Pre visit review using our clinic review tool, if applicable. No additional management support is needed unless otherwise documented below in the visit note.  Take 1 tablet daily except 2 tablets on Mondays only. Re-check in 6 weeks.  

## 2019-02-18 NOTE — Progress Notes (Signed)
I have reviewed and agree with note, evaluation, plan.   Seibert Keeter, MD  

## 2019-02-19 ENCOUNTER — Ambulatory Visit: Payer: Medicare HMO | Admitting: Family Medicine

## 2019-02-19 ENCOUNTER — Ambulatory Visit: Payer: Medicare HMO

## 2019-03-31 DIAGNOSIS — H26493 Other secondary cataract, bilateral: Secondary | ICD-10-CM | POA: Diagnosis not present

## 2019-04-01 ENCOUNTER — Other Ambulatory Visit: Payer: Self-pay

## 2019-04-01 ENCOUNTER — Ambulatory Visit (INDEPENDENT_AMBULATORY_CARE_PROVIDER_SITE_OTHER): Payer: Medicare HMO | Admitting: General Practice

## 2019-04-01 DIAGNOSIS — I4891 Unspecified atrial fibrillation: Secondary | ICD-10-CM

## 2019-04-01 DIAGNOSIS — Z23 Encounter for immunization: Secondary | ICD-10-CM

## 2019-04-01 DIAGNOSIS — Z7901 Long term (current) use of anticoagulants: Secondary | ICD-10-CM

## 2019-04-01 LAB — POCT INR: INR: 2.4 (ref 2.0–3.0)

## 2019-04-01 NOTE — Patient Instructions (Addendum)
Pre visit review using our clinic review tool, if applicable. No additional management support is needed unless otherwise documented below in the visit note.  Take 1 tablet daily except 2 tablets on Mondays only. Re-check in 6 weeks.  

## 2019-04-01 NOTE — Progress Notes (Signed)
I have reviewed and agree with note, evaluation, plan.   Stephen Hunter, MD  

## 2019-04-02 ENCOUNTER — Ambulatory Visit: Payer: Medicare HMO | Admitting: Internal Medicine

## 2019-04-02 ENCOUNTER — Encounter: Payer: Self-pay | Admitting: Internal Medicine

## 2019-04-02 VITALS — BP 118/66 | HR 89 | Ht 71.0 in | Wt 202.0 lb

## 2019-04-02 DIAGNOSIS — Z950 Presence of cardiac pacemaker: Secondary | ICD-10-CM | POA: Diagnosis not present

## 2019-04-02 DIAGNOSIS — I4891 Unspecified atrial fibrillation: Secondary | ICD-10-CM | POA: Diagnosis not present

## 2019-04-02 DIAGNOSIS — I442 Atrioventricular block, complete: Secondary | ICD-10-CM | POA: Diagnosis not present

## 2019-04-02 DIAGNOSIS — I10 Essential (primary) hypertension: Secondary | ICD-10-CM | POA: Diagnosis not present

## 2019-04-02 NOTE — Progress Notes (Signed)
HPI Mr. Oesterle returns today for followup of his symptomatic tachy/brady syndrome, s/p PPM insertion. He is an elderly man with a h/o symptomatic sinus node dysfunction and PAF, who underwent PPM change out 4 years ago. In the interim, he has done well. He notes that he is now mowing grass and is quite active.  No Known Allergies   Current Outpatient Medications  Medication Sig Dispense Refill  . atorvastatin (LIPITOR) 40 MG tablet TAKE 1 TABLET (40 MG TOTAL) BY MOUTH DAILY AT 6 PM. 90 tablet 2  . diltiazem (CARDIZEM CD) 240 MG 24 hr capsule TAKE 1 CAPSULE EVERY DAY 90 capsule 3  . finasteride (PROSCAR) 5 MG tablet     . fluticasone (FLONASE) 50 MCG/ACT nasal spray USE 2 SPRAYS IN EACH NOSTRIL EVERY DAY 48 g 2  . metoprolol succinate (TOPROL-XL) 50 MG 24 hr tablet TAKE 1 AND 1/2 TABLETS ONE TIME DAILY 135 tablet 1  . Multiple Vitamins-Minerals (MULTIVITAMIN & MINERAL PO) Take 1 tablet by mouth daily.    . nitroGLYCERIN (NITROSTAT) 0.4 MG SL tablet Place 1 tablet (0.4 mg total) under the tongue every 5 (five) minutes as needed for chest pain. 25 tablet 1  . omeprazole (PRILOSEC) 20 MG capsule Take 1 capsule (20 mg total) by mouth daily. 90 capsule 3  . tamsulosin (FLOMAX) 0.4 MG CAPS capsule TAKE 2 CAPSULES EVERY DAY 180 capsule 3  . warfarin (COUMADIN) 2.5 MG tablet TAKE 1 TABLET DAILY EXCEPT TAKE 2 TABLETS ON Mondays or AS DIRECTED BY THE COUMADIN CLINIC 120 tablet 1   No current facility-administered medications for this visit.      Past Medical History:  Diagnosis Date  . Actinic keratosis   . Adenomatous colon polyp   . ALLERGIC RHINITIS   . Arthritis   . CAD, NATIVE VESSEL    a. DES x2 to RCA 2004 b. repeat PTCA to RCA 2008 c. PCI to RCA 2012 non-DES   . Cellulitis and abscess of trunk   . Diverticulosis   . GERD (gastroesophageal reflux disease)   . HYPERLIPIDEMIA   . HYPERTENSION   . OTH MALIG NEOPLASM SKIN OTH\T\UNSPEC PARTS FACE   . Permanent atrial  fibrillation ([place])   . Skin cancer   . Tachy-brady syndrome ([place])    a. s/p MDT dual chamber pacemaker implant complicated by pericardial effusion and micro-perforatation s/p lead revisions    ROS:   All systems reviewed and negative except as noted in the HPI.   Past Surgical History:  Procedure Laterality Date  . BACK SURGERY  2000   "for bulging discs"  . CATARACT EXTRACTION W/ INTRAOCULAR LENS  IMPLANT, BILATERAL  2012  . CORONARY ANGIOPLASTY WITH STENT PLACEMENT  2008  . CORONARY ANGIOPLASTY WITH STENT PLACEMENT  05/14/11  . CYSTECTOMY     from right side of face.  . EP IMPLANTABLE DEVICE N/A 12/16/2014   Procedure: PPM Generator Changeout;  Surgeon: Evans Lance, MD;  Location: [place];  Service: Cardiovascular;  Laterality: N/A;  . LEAD REVISION  2008   11 days post initial implant, RA and RV lead revisions for micro-perforation and pericardial effusion by Dr Lovena Le  . LEFT HEART CATHETERIZATION WITH CORONARY ANGIOGRAM N/A 05/14/2011   Procedure: LEFT HEART CATHETERIZATION WITH CORONARY ANGIOGRAM;  Surgeon: Hillary Bow, MD;  Location: [facility];  Service: Cardiovascular;  Laterality: N/A;  . PACEMAKER INSERTION  2008   MDT dual chamber pacemaker implanted by Dr Percival Spanish  Family History  Problem Relation Age of Onset  . Ovarian cancer Mother   . Heart attack Father   . Colon cancer Father 89       deceased 42  . Diabetes Brother        x 2  . Heart disease Brother        x 3     Social History   Socioeconomic History  . Marital status: Married    Spouse name: Not on file  . Number of children: 2  . Years of education: Not on file  . Highest education level: Not on file  Occupational History  . Occupation: retired    Comment: [employer]   Social Needs  . Financial resource strain: Not on file  . Food insecurity    Worry: Not on file    Inability: Not on file  . Transportation needs    Medical: Not on file    Non-medical: Not on  file  Tobacco Use  . Smoking status: Never Smoker  . Smokeless tobacco: Never Used  Substance and Sexual Activity  . Alcohol use: No    Comment: 05/14/11 "last occasional drink was in 1990's"  . Drug use: No  . Sexual activity: Yes  Lifestyle  . Physical activity    Days per week: Not on file    Minutes per session: Not on file  . Stress: Not on file  Relationships  . Social Herbalist on phone: Not on file    Gets together: Not on file    Attends religious service: Not on file    Active member of club or organization: Not on file    Attends meetings of clubs or organizations: Not on file    Relationship status: Not on file  . Intimate partner violence    Fear of current or ex partner: Not on file    Emotionally abused: Not on file    Physically abused: Not on file    Forced sexual activity: Not on file  Other Topics Concern  . Not on file  Social History Narrative   Widowed March 2016. 2 sons. 4 grandkids- all through college. 1 greatgrandchild on way 11/2015.    Lives by himself.       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     BP 118/66   Pulse 89   Ht 5\' 11"  (1.803 m)   Wt 202 lb (91.6 kg)   SpO2 96%   BMI 28.17 kg/m   Physical Exam:  Well appearing NAD HEENT: Unremarkable Neck:  No JVD, no thyromegally Lymphatics:  No adenopathy Back:  No CVA tenderness Lungs:  Clear with no wheezes HEART:  Regular rate rhythm, no murmurs, no rubs, no clicks Abd:  soft, positive bowel sounds, no organomegally, no rebound, no guarding Ext:  2 plus pulses, no edema, no cyanosis, no clubbing Skin:  No rashes no nodules Neuro:  CN II through XII intact, motor grossly intact  EKG - atrial fib with a controlled VR  DEVICE  Normal device function.  See PaceArt for details.   Assess/Plan: 1. Atrial fib - his rates are well  controlled. He is encouraged to continue his current meds. 2. CAD - he denies anginal symptoms. He is encouraged to walk more. 3. PPM - his medtronic single chamber PPM  is working normally. We will recheck in several months.  Mikle Bosworth.D.

## 2019-04-02 NOTE — Patient Instructions (Signed)
Medication Instructions:  Your physician recommends that you continue on your current medications as directed. Please refer to the Current Medication list given to you today.  Labwork: None ordered.  Testing/Procedures: None ordered.  Follow-Up: Your physician wants you to follow-up in: one year with Dr. Lovena Le.   You will receive a reminder letter in the mail two months in advance. If you don't receive a letter, please call our office to schedule the follow-up appointment.  Remote monitoring is used to monitor your Pacemaker from home. This monitoring reduces the number of office visits required to check your device to one time per year. It allows [place] to keep an eye on the functioning of your device to ensure it is working properly. You are scheduled for a device check from home on 04/20/2019. You may send your transmission at any time that day. If you have a wireless device, the transmission will be sent automatically. After your physician reviews your transmission, you will receive a postcard with your next transmission date.  Any Other Special Instructions Will Be Listed Below (If Applicable).  If you need a refill on your cardiac medications before your next appointment, please call your pharmacy.

## 2019-04-03 DIAGNOSIS — D485 Neoplasm of uncertain behavior of skin: Secondary | ICD-10-CM | POA: Diagnosis not present

## 2019-04-03 DIAGNOSIS — C44329 Squamous cell carcinoma of skin of other parts of face: Secondary | ICD-10-CM | POA: Diagnosis not present

## 2019-04-03 DIAGNOSIS — Z85828 Personal history of other malignant neoplasm of skin: Secondary | ICD-10-CM | POA: Diagnosis not present

## 2019-04-03 DIAGNOSIS — L570 Actinic keratosis: Secondary | ICD-10-CM | POA: Diagnosis not present

## 2019-04-03 DIAGNOSIS — L821 Other seborrheic keratosis: Secondary | ICD-10-CM | POA: Diagnosis not present

## 2019-04-09 ENCOUNTER — Telehealth: Payer: Self-pay | Admitting: Family Medicine

## 2019-04-09 NOTE — Telephone Encounter (Signed)
I left a message asking the patient to call and schedule Medicare AWV with Courtney ([facility] Health Coach).  If patient calls back, please schedule Medicare Wellness Visit at next available opening.  VDM (Dee-Dee) °

## 2019-04-14 DIAGNOSIS — H26492 Other secondary cataract, left eye: Secondary | ICD-10-CM | POA: Diagnosis not present

## 2019-04-20 ENCOUNTER — Ambulatory Visit (INDEPENDENT_AMBULATORY_CARE_PROVIDER_SITE_OTHER): Payer: Medicare HMO | Admitting: *Deleted

## 2019-04-20 DIAGNOSIS — I442 Atrioventricular block, complete: Secondary | ICD-10-CM | POA: Diagnosis not present

## 2019-04-20 DIAGNOSIS — I4891 Unspecified atrial fibrillation: Secondary | ICD-10-CM

## 2019-04-21 LAB — CUP PACEART REMOTE DEVICE CHECK
Battery Impedance: 1260 Ohm
Battery Remaining Longevity: 54 mo
Battery Voltage: 2.76 V
Brady Statistic RV Percent Paced: 78 %
Date Time Interrogation Session: 20201116170645
Implantable Lead Implant Date: 20081007
Implantable Lead Implant Date: 20081007
Implantable Lead Location: 753859
Implantable Lead Location: 753860
Implantable Lead Model: 5076
Implantable Lead Model: 5076
Implantable Pulse Generator Implant Date: 20160714
Lead Channel Impedance Value: 0 Ohm
Lead Channel Impedance Value: 484 Ohm
Lead Channel Pacing Threshold Amplitude: 0.75 V
Lead Channel Pacing Threshold Pulse Width: 0.4 ms
Lead Channel Setting Pacing Amplitude: 2 V
Lead Channel Setting Pacing Pulse Width: 0.4 ms
Lead Channel Setting Sensing Sensitivity: 1.4 mV

## 2019-05-02 NOTE — Progress Notes (Signed)
Cardiology Office Note   Date:  05/04/2019   ID:  Shawn [name], DOB 10/16/1934, MRN [id number]  PCP:  Shawn Olp, MD  Cardiologist:   Shawn Breeding, MD   Chief Complaint  Patient presents with  . Atrial Fibrillation      History of Present Illness: Shawn [name] is a 84 y.o. male who presents for followup of atrial fib. Since I last saw him he has done well.  The patient denies any new symptoms such as chest discomfort, neck or arm discomfort. There has been no new shortness of breath, PND or orthopnea. There have been no reported palpitations, presyncope or syncope.    Past Medical History:  Diagnosis Date  . Actinic keratosis   . Adenomatous colon polyp   . ALLERGIC RHINITIS   . Arthritis   . CAD, NATIVE VESSEL    a. DES x2 to RCA 2004 b. repeat PTCA to RCA 2008 c. PCI to RCA 2012 non-DES   . Cellulitis and abscess of trunk   . Diverticulosis   . GERD (gastroesophageal reflux disease)   . HYPERLIPIDEMIA   . HYPERTENSION   . OTH MALIG NEOPLASM SKIN OTH\T\UNSPEC PARTS FACE   . Permanent atrial fibrillation ([place])   . Skin cancer   . Tachy-brady syndrome ([place])    a. s/p MDT dual chamber pacemaker implant complicated by pericardial effusion and micro-perforatation s/p lead revisions    Past Surgical History:  Procedure Laterality Date  . BACK SURGERY  2000   "for bulging discs"  . CATARACT EXTRACTION W/ INTRAOCULAR LENS  IMPLANT, BILATERAL  2012  . CORONARY ANGIOPLASTY WITH STENT PLACEMENT  2008  . CORONARY ANGIOPLASTY WITH STENT PLACEMENT  05/14/11  . CYSTECTOMY     from right side of face.  . EP IMPLANTABLE DEVICE N/A 12/16/2014   Procedure: PPM Generator Changeout;  Surgeon: Shawn Lance, MD;  Location: [place] [facility];  Service: Cardiovascular;  Laterality: N/A;  . LEAD REVISION  2008   11 days post initial implant, RA and RV lead revisions for micro-perforation and pericardial effusion by Dr Shawn [name]  . LEFT HEART CATHETERIZATION WITH  CORONARY ANGIOGRAM N/A 05/14/2011   Procedure: LEFT HEART CATHETERIZATION WITH CORONARY ANGIOGRAM;  Surgeon: Shawn Bow, MD;  [facility];  Service: Cardiovascular;  Laterality: N/A;  . PACEMAKER INSERTION  2008   MDT dual chamber pacemaker implanted by Dr Shawn [name]     Current Outpatient Medications  Medication Sig Dispense Refill  . atorvastatin (LIPITOR) 40 MG tablet TAKE 1 TABLET (40 MG TOTAL) BY MOUTH DAILY AT 6 PM. 90 tablet 2  . diltiazem (CARDIZEM CD) 240 MG 24 hr capsule TAKE 1 CAPSULE EVERY DAY 90 capsule 3  . finasteride (PROSCAR) 5 MG tablet     . fluticasone (FLONASE) 50 MCG/ACT nasal spray USE 2 SPRAYS IN EACH NOSTRIL EVERY DAY 48 g 2  . metoprolol succinate (TOPROL-XL) 50 MG 24 hr tablet TAKE 1 AND 1/2 TABLETS ONE TIME DAILY 135 tablet 1  . Multiple Vitamins-Minerals (MULTIVITAMIN & MINERAL PO) Take 1 tablet by mouth daily.    . nitroGLYCERIN (NITROSTAT) 0.4 MG SL tablet Place 1 tablet (0.4 mg total) under the tongue every 5 (five) minutes as needed for chest pain. 25 tablet 1  . omeprazole (PRILOSEC) 20 MG capsule Take 1 capsule (20 mg total) by mouth daily. 90 capsule 3  . tamsulosin (FLOMAX) 0.4 MG CAPS capsule TAKE 2 CAPSULES EVERY DAY 180 capsule 3  .  warfarin (COUMADIN) 2.5 MG tablet TAKE 1 TABLET DAILY EXCEPT TAKE 2 TABLETS ON Mondays or AS DIRECTED BY THE COUMADIN CLINIC 120 tablet 1   No current facility-administered medications for this visit.     Allergies:   Patient has no known allergies.    ROS:  Please see the history of present illness.   Otherwise, review of systems are positive for none.   All other systems are reviewed and negative.    PHYSICAL EXAM: VS:  BP (!) 147/72   Pulse 90   Ht 5\' 8"  (1.727 m)   Wt 204 lb 12.8 oz (92.9 kg)   SpO2 95%   BMI 31.14 kg/m  , BMI Body mass index is 31.14 kg/m. GENERAL:  Well appearing NECK:  No jugular venous distention, waveform within normal limits, carotid upstroke brisk and symmetric, no  bruits, no thyromegaly LUNGS:  Clear to auscultation bilaterally CHEST:  Well healed pacemaker pocket.   HEART:  PMI not displaced or sustained,S1 and S2 within normal limits, no S3, no clicks, no rubs, no murmurs ABD:  Flat, positive bowel sounds normal in frequency in pitch, no bruits, no rebound, no guarding, no midline pulsatile mass, no hepatomegaly, no splenomegaly EXT:  2 plus pulses throughout, no edema, no cyanosis no clubbing   EKG:  EKG is not ordered today.   Recent Labs: 07/07/2018: ALT 22; BUN 13; Creatinine, Ser 1.03; Hemoglobin 14.9; Platelets 171.0; Potassium 3.8; Sodium 141    Lipid Panel    Component Value Date/Time   CHOL 109 07/07/2018 1002   CHOL 109 04/16/2017 0923   TRIG 166.0 (H) 07/07/2018 1002   HDL 31.00 (L) 07/07/2018 1002   HDL 39 (L) 04/16/2017 0923   CHOLHDL 4 07/07/2018 1002   VLDL 33.2 07/07/2018 1002   LDLCALC 45 07/07/2018 1002   LDLCALC 48 04/16/2017 0923   LDLDIRECT 54.0 11/21/2015 0851      Wt Readings from Last 3 Encounters:  05/04/19 204 lb 12.8 oz (92.9 kg)  04/02/19 202 lb (91.6 kg)  10/28/18 199 lb 9.6 oz (90.5 kg)      Other studies Reviewed: Additional studies/ records that were reviewed today include: Labs. Review of the above records demonstrates:  Please see elsewhere in the note.     ASSESSMENT AND PLAN:   ATRIAL FIBRILLATION -    [name] has a CHA2DS2 - VASc score of 3.   He tolerates rate control and anticoagulation.  I will check a CBC today.   CAD, NATIVE VESSEL -  He had a stress perfusion study in 2012 demonstrated no high risk findings.    No change in therapy.  He will continue with risk reduction.   PACEMAKER, PERMANENT -  He is up to date with follow up.    DYSLIPIDEMIA - LDL was 45.  No change in therapy.  HTN - This is usually not a problem.  I talked to him about getting a blood pressure cuff and keeping a diary.    Current medicines are reviewed at length with the patient today.   The patient does not have concerns regarding medicines.  The following changes have been made:  no change  Labs/ tests ordered today include:   Orders Placed This Encounter  Procedures  . CBC     Disposition:   FU with me in 12 months.     Signed, Shawn Breeding, MD  05/04/2019 9:57 AM    [facility]

## 2019-05-04 ENCOUNTER — Encounter: Payer: Self-pay | Admitting: Cardiology

## 2019-05-04 ENCOUNTER — Ambulatory Visit (INDEPENDENT_AMBULATORY_CARE_PROVIDER_SITE_OTHER): Payer: Medicare HMO | Admitting: Cardiology

## 2019-05-04 ENCOUNTER — Other Ambulatory Visit: Payer: Self-pay

## 2019-05-04 VITALS — BP 147/72 | HR 90 | Ht 68.0 in | Wt 204.8 lb

## 2019-05-04 DIAGNOSIS — I482 Chronic atrial fibrillation, unspecified: Secondary | ICD-10-CM

## 2019-05-04 DIAGNOSIS — E785 Hyperlipidemia, unspecified: Secondary | ICD-10-CM

## 2019-05-04 DIAGNOSIS — I2510 Atherosclerotic heart disease of native coronary artery without angina pectoris: Secondary | ICD-10-CM

## 2019-05-04 DIAGNOSIS — I442 Atrioventricular block, complete: Secondary | ICD-10-CM

## 2019-05-04 DIAGNOSIS — I10 Essential (primary) hypertension: Secondary | ICD-10-CM | POA: Diagnosis not present

## 2019-05-04 LAB — CBC
Hematocrit: 44 % (ref 37.5–51.0)
Hemoglobin: 14.7 g/dL (ref 13.0–17.7)
MCH: 31.3 pg (ref 26.6–33.0)
MCHC: 33.4 g/dL (ref 31.5–35.7)
MCV: 94 fL (ref 79–97)
Platelets: 173 10*3/uL (ref 150–450)
RBC: 4.69 x10E6/uL (ref 4.14–5.80)
RDW: 12.8 % (ref 11.6–15.4)
WBC: 9 10*3/uL (ref 3.4–10.8)

## 2019-05-04 NOTE — Patient Instructions (Signed)
Medication Instructions:  Your physician recommends that you continue on your current medications as directed. Please refer to the Current Medication list given to you today.  If you need a refill on your cardiac medications before your next appointment, please call your pharmacy.   Lab work: CBC If you have labs (blood work) drawn today and your tests are completely normal, you will receive your results only by: [place] (if you have MyChart) OR A paper copy in the mail If you have any lab test that is abnormal or we need to change your treatment, we will call you to review the results.  Testing/Procedures: NONE  Follow-Up: At [facility], you and your health needs are our priority.  As part of our continuing mission to provide you with exceptional heart care, we have created designated Provider Care Teams.  These Care Teams include your primary Cardiologist (physician) and Advanced Practice Providers (APPs -  Physician Assistants and Nurse Practitioners) who all work together to provide you with the care you need, when you need it. You may see Minus Breeding, MD or one of the following Advanced Practice Providers on your designated Care Team:    Rosaria Ferries, PA-C  Jory Sims, DNP, ANP  Cadence Kathlen Mody, NP  Your physician wants you to follow-up in: 1 year. You will receive a reminder letter in the mail two months in advance. If you don't receive a letter, please call our office to schedule the follow-up appointment.

## 2019-05-11 DIAGNOSIS — M25561 Pain in right knee: Secondary | ICD-10-CM | POA: Diagnosis not present

## 2019-05-13 ENCOUNTER — Other Ambulatory Visit: Payer: Self-pay

## 2019-05-13 ENCOUNTER — Ambulatory Visit (INDEPENDENT_AMBULATORY_CARE_PROVIDER_SITE_OTHER): Payer: Medicare HMO | Admitting: General Practice

## 2019-05-13 ENCOUNTER — Other Ambulatory Visit: Payer: Self-pay | Admitting: General Practice

## 2019-05-13 DIAGNOSIS — Z7901 Long term (current) use of anticoagulants: Secondary | ICD-10-CM | POA: Diagnosis not present

## 2019-05-13 DIAGNOSIS — I10 Essential (primary) hypertension: Secondary | ICD-10-CM

## 2019-05-13 DIAGNOSIS — E785 Hyperlipidemia, unspecified: Secondary | ICD-10-CM

## 2019-05-13 DIAGNOSIS — I4891 Unspecified atrial fibrillation: Secondary | ICD-10-CM

## 2019-05-13 LAB — POCT INR: INR: 2.4 (ref 2.0–3.0)

## 2019-05-13 MED ORDER — ATORVASTATIN CALCIUM 40 MG PO TABS: 40.0000 mg | ORAL_TABLET | Freq: Every day | ORAL | 0 refills | Status: DC

## 2019-05-13 MED ORDER — METOPROLOL SUCCINATE ER 50 MG PO TB24: ORAL_TABLET | ORAL | 0 refills | Status: DC

## 2019-05-13 MED ORDER — WARFARIN SODIUM 2.5 MG PO TABS: ORAL_TABLET | ORAL | 0 refills | Status: DC

## 2019-05-13 NOTE — Progress Notes (Signed)
I have reviewed and agree with note, evaluation, plan.   Cyanna Neace, MD  

## 2019-05-13 NOTE — Patient Instructions (Addendum)
Pre visit review using our clinic review tool, if applicable. No additional management support is needed unless otherwise documented below in the visit note.  Take 1 tablet daily except 2 tablets on Mondays only. Re-check in 6 weeks.  

## 2019-05-16 NOTE — Progress Notes (Signed)
Remote pacemaker transmission.   

## 2019-05-18 ENCOUNTER — Other Ambulatory Visit: Payer: Self-pay

## 2019-05-18 DIAGNOSIS — C44329 Squamous cell carcinoma of skin of other parts of face: Secondary | ICD-10-CM | POA: Diagnosis not present

## 2019-05-18 DIAGNOSIS — Z7901 Long term (current) use of anticoagulants: Secondary | ICD-10-CM

## 2019-05-18 MED ORDER — WARFARIN SODIUM 2.5 MG PO TABS: ORAL_TABLET | ORAL | 0 refills | Status: DC

## 2019-05-20 ENCOUNTER — Other Ambulatory Visit: Payer: Self-pay | Admitting: Family Medicine

## 2019-05-27 ENCOUNTER — Telehealth: Payer: Self-pay | Admitting: General Practice

## 2019-05-27 NOTE — Telephone Encounter (Signed)
Copied from [place] [id number]. Topic: General - Inquiry >> May 27, 2019 11:05 AM Richardo Priest, NT wrote: Reason for CRM: Pt called in stating he would like to speak with Caren Griffins about some medication. Please advise.

## 2019-05-27 NOTE — Telephone Encounter (Signed)
Dr. Yong Channel,  Would you please have CMA address re-fills with patient?  I sent re-fills last week but patient is saying that pharmacy needs something from you.  Not sure.  Thanks, [employer]

## 2019-05-28 NOTE — Telephone Encounter (Signed)
Called and spoke with pt and [facility] states they have shipped the refills that pt was needing.,

## 2019-05-28 NOTE — Telephone Encounter (Signed)
Team please check on Mr. Orgeron and needed refills.   Jenny Reichmann- thanks for letting us know!

## 2019-06-11 LAB — CUP PACEART INCLINIC DEVICE CHECK
Date Time Interrogation Session: 20201029114836
Implantable Lead Implant Date: 20081007
Implantable Lead Implant Date: 20081007
Implantable Lead Location: 753859
Implantable Lead Location: 753860
Implantable Lead Model: 5076
Implantable Lead Model: 5076
Implantable Pulse Generator Implant Date: 20160714

## 2019-06-17 ENCOUNTER — Ambulatory Visit (INDEPENDENT_AMBULATORY_CARE_PROVIDER_SITE_OTHER): Payer: Medicare HMO | Admitting: General Practice

## 2019-06-17 ENCOUNTER — Other Ambulatory Visit: Payer: Self-pay

## 2019-06-17 DIAGNOSIS — Z7901 Long term (current) use of anticoagulants: Secondary | ICD-10-CM | POA: Diagnosis not present

## 2019-06-17 LAB — POCT INR: INR: 2.9 (ref 2.0–3.0)

## 2019-06-17 NOTE — Patient Instructions (Addendum)
Pre visit review using our clinic review tool, if applicable. No additional management support is needed unless otherwise documented below in the visit note.  Take 1 tablet daily except 2 tablets on Mondays only. Re-check in 6 weeks.  

## 2019-06-17 NOTE — Progress Notes (Signed)
I have reviewed and agree with note, evaluation, plan.   Maili Shutters, MD  

## 2019-06-24 DIAGNOSIS — M25561 Pain in right knee: Secondary | ICD-10-CM | POA: Diagnosis not present

## 2019-06-24 DIAGNOSIS — M1711 Unilateral primary osteoarthritis, right knee: Secondary | ICD-10-CM | POA: Diagnosis not present

## 2019-07-02 DIAGNOSIS — L923 Foreign body granuloma of the skin and subcutaneous tissue: Secondary | ICD-10-CM | POA: Diagnosis not present

## 2019-07-10 ENCOUNTER — Other Ambulatory Visit: Payer: Self-pay | Admitting: Family Medicine

## 2019-07-20 ENCOUNTER — Ambulatory Visit (INDEPENDENT_AMBULATORY_CARE_PROVIDER_SITE_OTHER): Payer: Medicare HMO | Admitting: *Deleted

## 2019-07-20 DIAGNOSIS — I442 Atrioventricular block, complete: Secondary | ICD-10-CM

## 2019-07-21 LAB — CUP PACEART REMOTE DEVICE CHECK
Battery Impedance: 1478 Ohm
Battery Remaining Longevity: 48 mo
Battery Voltage: 2.76 V
Brady Statistic RV Percent Paced: 80 %
Date Time Interrogation Session: 20210216151826
Implantable Lead Implant Date: 20081007
Implantable Lead Implant Date: 20081007
Implantable Lead Location: 753859
Implantable Lead Location: 753860
Implantable Lead Model: 5076
Implantable Lead Model: 5076
Implantable Pulse Generator Implant Date: 20160714
Lead Channel Impedance Value: 0 Ohm
Lead Channel Impedance Value: 505 Ohm
Lead Channel Pacing Threshold Amplitude: 0.875 V
Lead Channel Pacing Threshold Pulse Width: 0.4 ms
Lead Channel Setting Pacing Amplitude: 2 V
Lead Channel Setting Pacing Pulse Width: 0.4 ms
Lead Channel Setting Sensing Sensitivity: 1.4 mV

## 2019-07-21 NOTE — Progress Notes (Deleted)
PPM R

## 2019-07-28 ENCOUNTER — Ambulatory Visit (INDEPENDENT_AMBULATORY_CARE_PROVIDER_SITE_OTHER): Payer: Medicare HMO

## 2019-07-28 ENCOUNTER — Other Ambulatory Visit: Payer: Self-pay

## 2019-07-28 ENCOUNTER — Encounter (INDEPENDENT_AMBULATORY_CARE_PROVIDER_SITE_OTHER): Payer: Self-pay

## 2019-07-28 DIAGNOSIS — Z0000 Encounter for general adult medical examination without abnormal findings: Secondary | ICD-10-CM | POA: Diagnosis not present

## 2019-07-28 NOTE — Progress Notes (Signed)
This visit is being conducted via phone call due to the COVID-19 pandemic. This patient has given me verbal consent via phone to conduct this visit, patient states they are participating from their home address. Some vital signs may be absent or patient reported.   Patient identification: identified by name, DOB, and current address.  Location provider: [facility], Office Persons participating in the virtual visit: Denman George LPN, patient, and Dr. Garret Reddish     Subjective:   Shawn [name] is a 85 y.o. male who presents for Medicare Annual/Subsequent preventive examination.  Review of Systems:   Cardiac Risk Factors include: advanced age (>69men, >68 women);male gender;dyslipidemia;hypertension    Objective:    Vitals: There were no vitals taken for this visit.  There is no height or weight on file to calculate BMI.  Advanced Directives 07/28/2019 02/06/2018 02/17/2016 05/14/2011 05/14/2011  Does Patient Have a Medical Advance Directive? Yes Yes Yes Patient would not like information Patient does not have advance directive  Type of Advance Directive Living will;Healthcare Power of [place];Living will [place];Living will - -  Does patient want to make changes to medical advance directive? No - Patient declined No - Patient declined - - -  Copy of [place] in Chart? No - copy requested No - copy requested Yes - -  Pre-existing out of facility DNR order (yellow form or pink MOST form) - - - - No    Tobacco Social History   Tobacco Use  Smoking Status Never Smoker  Smokeless Tobacco Never Used     Counseling given: Not Answered   Clinical Intake:  Pre-visit preparation completed: Yes  Pain : No/denies pain  Diabetes: No  How often do you need to have someone help you when you read instructions, pamphlets, or other written materials from your doctor or pharmacy?: 1 - Never  Interpreter  Needed?: No  Information entered by :: Denman George LPN  Past Medical History:  Diagnosis Date  . Actinic keratosis   . Adenomatous colon polyp   . ALLERGIC RHINITIS   . Arthritis   . CAD, NATIVE VESSEL    a. DES x2 to RCA 2004 b. repeat PTCA to RCA 2008 c. PCI to RCA 2012 non-DES   . Cellulitis and abscess of trunk   . Diverticulosis   . GERD (gastroesophageal reflux disease)   . HYPERLIPIDEMIA   . HYPERTENSION   . OTH MALIG NEOPLASM SKIN OTH\T\UNSPEC PARTS FACE   . Permanent atrial fibrillation ([place])   . Skin cancer   . Tachy-brady syndrome ([place])    a. s/p MDT dual chamber pacemaker implant complicated by pericardial effusion and micro-perforatation s/p lead revisions   Past Surgical History:  Procedure Laterality Date  . BACK SURGERY  2000   "for bulging discs"  . CATARACT EXTRACTION W/ INTRAOCULAR LENS  IMPLANT, BILATERAL  2012  . CORONARY ANGIOPLASTY WITH STENT PLACEMENT  2008  . CORONARY ANGIOPLASTY WITH STENT PLACEMENT  05/14/11  . CYSTECTOMY     from right side of face.  . EP IMPLANTABLE DEVICE N/A 12/16/2014   Procedure: PPM Generator Changeout;  Surgeon: Evans Lance, MD;  Location: [facility];  Service: Cardiovascular;  Laterality: N/A;  . LEAD REVISION  2008   11 days post initial implant, RA and RV lead revisions for micro-perforation and pericardial effusion by Dr Lovena Le  . LEFT HEART CATHETERIZATION WITH CORONARY ANGIOGRAM N/A 05/14/2011   Procedure: LEFT  HEART CATHETERIZATION WITH CORONARY ANGIOGRAM;  Surgeon: Hillary Bow, MD;  Location: [facility] CATH LAB;  Service: Cardiovascular;  Laterality: N/A;  . PACEMAKER INSERTION  2008   MDT dual chamber pacemaker implanted by Dr Percival Spanish   Family History  Problem Relation Age of Onset  . Ovarian cancer Mother   . Heart attack Father   . Colon cancer Father 63       deceased 8  . Diabetes Brother        x 2  . Heart disease Brother        x 3   Social History   Socioeconomic History  . Marital  status: Married    Spouse name: Not on file  . Number of children: 2  . Years of education: Not on file  . Highest education level: Not on file  Occupational History  . Occupation: retired    Comment: [employer]   Tobacco Use  . Smoking status: Never Smoker  . Smokeless tobacco: Never Used  Substance and Sexual Activity  . Alcohol use: No    Comment: 05/14/11 "last occasional drink was in 1990's"  . Drug use: No  . Sexual activity: Yes  Other Topics Concern  . Not on file  Social History Narrative   Widowed March 2016. 2 sons. 4 grandkids- all through college. 1 greatgrandchild on way 11/2015.    Lives by himself.       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   Social Determinants of Health   Financial Resource Strain:   . Difficulty of Paying Living Expenses: Not on file  Food Insecurity:   . Worried About Charity fundraiser in the Last Year: Not on file  . Ran Out of Food in the Last Year: Not on file  Transportation Needs:   . Lack of Transportation (Medical): Not on file  . Lack of Transportation (Non-Medical): Not on file  Physical Activity:   . Days of Exercise per Week: Not on file  . Minutes of Exercise per Session: Not on file  Stress:   . Feeling of Stress : Not on file  Social Connections:   . Frequency of Communication with Friends and Family: Not on file  . Frequency of Social Gatherings with Friends and Family: Not on file  . Attends Religious Services: Not on file  . Active Member of Clubs or Organizations: Not on file  . Attends Archivist Meetings: Not on file  . Marital Status: Not on file    Outpatient Encounter Medications as of 07/28/2019  Medication Sig  . atorvastatin (LIPITOR) 40 MG tablet Take 1 tablet (40 mg total) by mouth daily at 6 PM.  . diltiazem (CARDIZEM CD) 240 MG 24 hr capsule TAKE 1 CAPSULE  EVERY DAY  . finasteride (PROSCAR) 5 MG tablet   . fluticasone (FLONASE) 50 MCG/ACT nasal spray USE 2 SPRAYS IN EACH NOSTRIL EVERY DAY  . metoprolol succinate (TOPROL-XL) 50 MG 24 hr tablet Take with or immediately following a meal.  . Multiple Vitamins-Minerals (MULTIVITAMIN & MINERAL PO) Take 1 tablet by mouth daily.  . nitroGLYCERIN (NITROSTAT) 0.4 MG SL tablet Place 1 tablet (0.4 mg total) under the tongue every 5 (five) minutes as needed for chest pain.  Marland Kitchen omeprazole (PRILOSEC) 20 MG capsule Take 1 capsule (20 mg total) by mouth daily.  Marland Kitchen  tamsulosin (FLOMAX) 0.4 MG CAPS capsule TAKE 2 CAPSULES EVERY DAY  . warfarin (COUMADIN) 2.5 MG tablet TAKE 1 TABLET DAILY EXCEPT TAKE 2 TABLETS ON Mondays or AS DIRECTED BY THE COUMADIN CLINIC   No facility-administered encounter medications on file as of 07/28/2019.    Activities of Daily Living In your present state of health, do you have any difficulty performing the following activities: 07/28/2019  Hearing? N  Vision? N  Difficulty concentrating or making decisions? N  Walking or climbing stairs? N  Dressing or bathing? N  Doing errands, shopping? N  Preparing Food and eating ? N  Using the Toilet? N  In the past six months, have you accidently leaked urine? N  Do you have problems with loss of bowel control? N  Managing your Medications? N  Managing your Finances? N  Housekeeping or managing your Housekeeping? N  Some recent data might be hidden    Patient Care Team: Marin Olp, MD as PCP - General (Family Medicine) Minus Breeding, MD as PCP - Cardiology (Cardiology) Marin Olp, MD (Family Medicine) Ceasar Mons, MD as Consulting Physician (Urology) Karin Golden, MD as Consulting Physician (Dermatology) Calvert Cantor, MD as Consulting Physician (Ophthalmology) Paralee Cancel, MD as Consulting Physician (Orthopedic Surgery)   Assessment:   This is a routine wellness examination for Shawn [name].  Exercise  Activities and Dietary recommendations Current Exercise Habits: Home exercise routine, Type of exercise: walking, Time (Minutes): 30, Frequency (Times/Week): 4, Weekly Exercise (Minutes/Week): 120, Intensity: Mild  Goals   None     Fall Risk Fall Risk  07/28/2019 10/28/2018 07/07/2018 02/06/2018 05/20/2017  Falls in the past year? 0 0 0 No No  Number falls in past yr: 0 0 0 - -  Injury with Fall? 0 0 0 - -  Risk for fall due to : Orthopedic patient - - - -  Follow up Education provided;Falls prevention discussed;Falls evaluation completed - - - -   Is the patient's home free of loose throw rugs in walkways, pet beds, electrical cords, etc?   yes      Grab bars in the bathroom? yes      Handrails on the stairs?   yes      Adequate lighting?   yes   Depression Screen PHQ 2/9 Scores 07/28/2019 10/28/2018 07/07/2018 02/06/2018  PHQ - 2 Score 0 0 3 0  PHQ- 9 Score - 5 13 0    Cognitive Function     6CIT Screen 07/28/2019  What Year? 0 points  What month? 0 points  What time? 0 points  Count back from 20 0 points  Months in reverse 0 points  Repeat phrase 2 points  Total Score 2    Immunization History  Administered Date(s) Administered  . Fluad Quad(high Dose 65+) 04/01/2019  . Influenza Split 03/24/2012  . Influenza Whole 03/10/2008, 03/16/2009, 04/05/2010  . Influenza, High Dose Seasonal PF 04/02/2016, 04/08/2017, 03/19/2018  . Influenza,inj,Quad PF,6+ Mos 03/30/2013, 03/16/2014, 03/11/2015  . Pneumococcal Conjugate-13 09/09/2014  . Pneumococcal Polysaccharide-23 11/05/2011  . Tdap 11/05/2011    Qualifies for Shingles Vaccine? Discussed and patient will check with pharmacy for coverage.  Patient education handout provided   Screening Tests Health Maintenance  Topic Date Due  . COLONOSCOPY  02/16/2021  . TETANUS/TDAP  11/04/2021  . INFLUENZA VACCINE  Completed  . PNA vac Low Risk Adult  Completed   Cancer Screenings: Lung: Low Dose CT Chest recommended if Age 12-80  years, 30 pack-year currently  smoking OR have quit w/in 15years. Patient does not qualify. Colorectal: colonoscopy 02/17/16; no longer indicated       Plan:  I have personally reviewed and addressed the Medicare Annual Wellness questionnaire and have noted the following in the patient's chart:  A. Medical and social history B. Use of alcohol, tobacco or illicit drugs  C. Current medications and supplements D. Functional ability and status E.  Nutritional status F.  Physical activity G. Advance directives H. List of other physicians I.  Hospitalizations, surgeries, and ER visits in previous 12 months J.  [place] such as hearing and vision if needed, cognitive and depression L. Referrals, records requested, and appointments- none   In addition, I have reviewed and discussed with patient certain preventive protocols, quality metrics, and best practice recommendations. A written personalized care plan for preventive services as well as general preventive health recommendations were provided to patient.   Signed,  Denman George, LPN  Nurse Health Advisor   Nurse Notes: Patient has completed Covid vaccine.  Patient would like discuss problems with bowels at next visit.  No blood noted per patient.

## 2019-07-28 NOTE — Patient Instructions (Signed)
Mr. Garry , Thank you for taking time to come for your Medicare Wellness Visit. I appreciate your ongoing commitment to your health goals. Please review the following plan we discussed and let me know if I can assist you in the future.   Screening recommendations/referrals: Colorectal Screening: up to date; last colonoscopy 02/17/16  Vision and Dental Exams: Recommended annual ophthalmology exams for early detection of glaucoma and other disorders of the eye Recommended annual dental exams for proper oral hygiene  Vaccinations: Influenza vaccine: completed 04/01/19 Pneumococcal vaccine: up to date; last 09/09/14 Tdap vaccine: up to date; last 11/05/11 Shingles vaccine: Please call your insurance company to determine your out of pocket expense for the Shingrix vaccine. You may receive this vaccine at your local pharmacy. (see handout)   Advanced directives: Please bring a copy of your POA (Power of Attorney) and/or Living Will to your next appointment.  Goals: Recommend to drink at least 6-8 8oz glasses of water per day and consume a balanced diet rich in fresh fruits and vegetables.   Next appointment: Please schedule your Annual Wellness Visit with your Nurse Health Advisor in one year.  Preventive Care 83 Years and Older, Male Preventive care refers to lifestyle choices and visits with your health care provider that can promote health and wellness. What does preventive care include?  A yearly physical exam. This is also called an annual well check.  Dental exams once or twice a year.  Routine eye exams. Ask your health care provider how often you should have your eyes checked.  Personal lifestyle choices, including:  Daily care of your teeth and gums.  Regular physical activity.  Eating a healthy diet.  Avoiding tobacco and drug use.  Limiting alcohol use.  Practicing safe sex.  Taking low doses of aspirin every day if recommended by your health care provider..  Taking  vitamin and mineral supplements as recommended by your health care provider. What happens during an annual well check? The services and screenings done by your health care provider during your annual well check will depend on your age, overall health, lifestyle risk factors, and family history of disease. Counseling  Your health care provider may ask you questions about your:  Alcohol use.  Tobacco use.  Drug use.  Emotional well-being.  Home and relationship well-being.  Sexual activity.  Eating habits.  History of falls.  Memory and ability to understand (cognition).  Work and work Statistician. Screening  You may have the following tests or measurements:  Height, weight, and BMI.  Blood pressure.  Lipid and cholesterol levels. These may be checked every 5 years, or more frequently if you are over 76 years old.  Skin check.  Lung cancer screening. You may have this screening every year starting at age 22 if you have a 30-pack-year history of smoking and currently smoke or have quit within the past 15 years.  Fecal occult blood test (FOBT) of the stool. You may have this test every year starting at age 50.  Flexible sigmoidoscopy or colonoscopy. You may have a sigmoidoscopy every 5 years or a colonoscopy every 10 years starting at age 18.  Prostate cancer screening. Recommendations will vary depending on your family history and other risks.  Hepatitis C blood test.  Hepatitis B blood test.  Sexually transmitted disease (STD) testing.  Diabetes screening. This is done by checking your blood sugar (glucose) after you have not eaten for a while (fasting). You may have this done every 1-3 years.  Abdominal aortic aneurysm (AAA) screening. You may need this if you are a current or former smoker.  Osteoporosis. You may be screened starting at age 43 if you are at high risk. Talk with your health care provider about your test results, treatment options, and if  necessary, the need for more tests. Vaccines  Your health care provider may recommend certain vaccines, such as:  Influenza vaccine. This is recommended every year.  Tetanus, diphtheria, and acellular pertussis (Tdap, Td) vaccine. You may need a Td booster every 10 years.  Zoster vaccine. You may need this after age 32.  Pneumococcal 13-valent conjugate (PCV13) vaccine. One dose is recommended after age 23.  Pneumococcal polysaccharide (PPSV23) vaccine. One dose is recommended after age 55. Talk to your health care provider about which screenings and vaccines you need and how often you need them. This information is not intended to replace advice given to you by your health care provider. Make sure you discuss any questions you have with your health care provider. Document Released: 06/17/2015 Document Revised: 02/08/2016 Document Reviewed: 03/22/2015 Elsevier Interactive Patient Education  2017 [employer] Prevention in the Home Falls can cause injuries. They can happen to people of all ages. There are many things you can do to make your home safe and to help prevent falls. What can I do on the outside of my home?  Regularly fix the edges of walkways and driveways and fix any cracks.  Remove anything that might make you trip as you walk through a door, such as a raised step or threshold.  Trim any bushes or trees on the path to your home.  Use bright outdoor lighting.  Clear any walking paths of anything that might make someone trip, such as rocks or tools.  Regularly check to see if handrails are loose or broken. Make sure that both sides of any steps have handrails.  Any raised decks and porches should have guardrails on the edges.  Have any leaves, snow, or ice cleared regularly.  Use sand or salt on walking paths during winter.  Clean up any spills in your garage right away. This includes oil or grease spills. What can I do in the bathroom?  Use night  lights.  Install grab bars by the toilet and in the tub and shower. Do not use towel bars as grab bars.  Use non-skid mats or decals in the tub or shower.  If you need to sit down in the shower, use a plastic, non-slip stool.  Keep the floor dry. Clean up any water that spills on the floor as soon as it happens.  Remove soap buildup in the tub or shower regularly.  Attach bath mats securely with double-sided non-slip rug tape.  Do not have throw rugs and other things on the floor that can make you trip. What can I do in the bedroom?  Use night lights.  Make sure that you have a light by your bed that is easy to reach.  Do not use any sheets or blankets that are too big for your bed. They should not hang down onto the floor.  Have a firm chair that has side arms. You can use this for support while you get dressed.  Do not have throw rugs and other things on the floor that can make you trip. What can I do in the kitchen?  Clean up any spills right away.  Avoid walking on wet floors.  Keep items that you use  a lot in easy-to-reach places.  If you need to reach something above you, use a strong step stool that has a grab bar.  Keep electrical cords out of the way.  Do not use floor polish or wax that makes floors slippery. If you must use wax, use non-skid floor wax.  Do not have throw rugs and other things on the floor that can make you trip. What can I do with my stairs?  Do not leave any items on the stairs.  Make sure that there are handrails on both sides of the stairs and use them. Fix handrails that are broken or loose. Make sure that handrails are as long as the stairways.  Check any carpeting to make sure that it is firmly attached to the stairs. Fix any carpet that is loose or worn.  Avoid having throw rugs at the top or bottom of the stairs. If you do have throw rugs, attach them to the floor with carpet tape.  Make sure that you have a light switch at the  top of the stairs and the bottom of the stairs. If you do not have them, ask someone to add them for you. What else can I do to help prevent falls?  Wear shoes that:  Do not have high heels.  Have rubber bottoms.  Are comfortable and fit you well.  Are closed at the toe. Do not wear sandals.  If you use a stepladder:  Make sure that it is fully opened. Do not climb a closed stepladder.  Make sure that both sides of the stepladder are locked into place.  Ask someone to hold it for you, if possible.  Clearly mark and make sure that you can see:  Any grab bars or handrails.  First and last steps.  Where the edge of each step is.  Use tools that help you move around (mobility aids) if they are needed. These include:  Canes.  Walkers.  Scooters.  Crutches.  Turn on the lights when you go into a dark area. Replace any light bulbs as soon as they burn out.  Set up your furniture so you have a clear path. Avoid moving your furniture around.  If any of your floors are uneven, fix them.  If there are any pets around you, be aware of where they are.  Review your medicines with your doctor. Some medicines can make you feel dizzy. This can increase your chance of falling. Ask your doctor what other things that you can do to help prevent falls. This information is not intended to replace advice given to you by your health care provider. Make sure you discuss any questions you have with your health care provider. Document Released: 03/17/2009 Document Revised: 10/27/2015 Document Reviewed: 06/25/2014 Elsevier Interactive Patient Education  2017 [employer].

## 2019-07-29 ENCOUNTER — Ambulatory Visit (INDEPENDENT_AMBULATORY_CARE_PROVIDER_SITE_OTHER): Payer: Medicare HMO | Admitting: General Practice

## 2019-07-29 DIAGNOSIS — Z7901 Long term (current) use of anticoagulants: Secondary | ICD-10-CM | POA: Diagnosis not present

## 2019-07-29 DIAGNOSIS — L7631 Postprocedural hematoma of skin and subcutaneous tissue following a dermatologic procedure: Secondary | ICD-10-CM | POA: Diagnosis not present

## 2019-07-29 LAB — POCT INR: INR: 2.5 (ref 2.0–3.0)

## 2019-07-29 NOTE — Patient Instructions (Signed)
Pre visit review using our clinic review tool, if applicable. No additional management support is needed unless otherwise documented below in the visit note.  Take 1 tablet daily except 2 tablets on Mondays only. Re-check in 6 weeks.  

## 2019-07-30 ENCOUNTER — Telehealth: Payer: Self-pay | Admitting: Cardiology

## 2019-07-30 NOTE — Progress Notes (Signed)
I have reviewed and agree with note, evaluation, plan.   Dereck Agerton, MD  

## 2019-07-30 NOTE — Telephone Encounter (Signed)
   Primary Cardiologist: Minus Breeding, MD  Chart reviewed as part of pre-operative protocol coverage. Patient was contacted 07/30/2019 in reference to pre-operative risk assessment for pending surgery as outlined below.  Shawn [name] was last seen on 05/04/19 by Dr. Percival Spanish.  Since that day, Shawn [name] has done well.  Therefore, based on ACC/AHA guidelines, the patient would be at acceptable risk for the planned procedure without further cardiovascular testing.   I will route this recommendation to the requesting party via Epic fax function and remove from pre-op pool.  Please call with questions.  [place], [place] 07/30/2019, 3:42 PM

## 2019-07-30 NOTE — Telephone Encounter (Signed)
Patient called stating he is having knee replacement surgery and he ortho surgeon faxed over a letter yesterday asking how long he needs to hold his blood thinner.  He wants to know if the office received it.

## 2019-07-30 NOTE — Telephone Encounter (Signed)
Im ok with 7 day hold- sending to [facility] to help set up restart and follow up after surgery. Please let patient know does slightly increase stroke risk but I think benefit of surgery outweights potential risk

## 2019-07-30 NOTE — Telephone Encounter (Signed)
S/w pt he states that PCP does his INR.  Received cardiac clearance will enter and send to pool and PCP.

## 2019-07-30 NOTE — Telephone Encounter (Signed)
Per Dr. Yong Channel -   "Im ok with 7 day hold- sending to [facility] to help set up restart and follow up after surgery. Please let patient know does slightly increase stroke risk but I think benefit of surgery outweights potential risk"  Called pt, no answer/no VM.

## 2019-07-30 NOTE — Telephone Encounter (Signed)
I will forward to pre op team to review notes from PCP.

## 2019-07-30 NOTE — Telephone Encounter (Signed)
   [facility] Pre-operative Risk Assessment    Request for surgical clearance:  1. What type of surgery is being performed? RIGHT TOTAL KNEE ARTHROPLASTY   2. When is this surgery scheduled? 08-11-2019   3. What type of clearance is required (medical clearance vs. Pharmacy clearance to hold med vs. Both)? MEDICAL  4. Are there any medications that need to be held prior to surgery and how long? WARFARIN  7DAYS-DONE BY PCP   5. Practice name and name of physician performing surgery? [facility]  MATTHEW [facility]  ATTN: SHERRY  6. What is your office phone number  [phone]    7.   What is your office fax number  [phone]  8.   Anesthesia type (None, local, MAC, general) ? SPINAL    (provider comments below)

## 2019-07-31 NOTE — Telephone Encounter (Signed)
Patient has been given the following instructions for warfarin pre and post procedure.  3/2 - Last dose of warfarin until after surgery  3/10 through 3/14 patient will take 5 mg of warfarin daily.  3/15 Patient will resume dosage of 2.5 mg daily except 5 mg of warfarin on Mondays.  3/24 - Tentative appointment of 3/24 @ 10 to check INR  Patient verbalized understanding.  I will touch base with patient on 3/10  and if Home Health is involved the RN will call Villa Herb, RN with INR's.

## 2019-07-31 NOTE — Telephone Encounter (Signed)
Please see message. °

## 2019-07-31 NOTE — H&P (Signed)
TOTAL KNEE ADMISSION H&P  Patient is being admitted for right total knee arthroplasty.  Subjective:  Chief Complaint:    Right knee primary OA / pain  HPI: Shawn [name], 85 y.o. male, has a history of pain and functional disability in the right knee due to arthritis and has failed non-surgical conservative treatments for greater than 12 weeks to include analgesics, corticosteriod injections and activity modification.  Onset of symptoms was gradual, starting 1+ years ago with gradually worsening course since that time. The patient noted no past surgery on the right knee(s).  Patient currently rates pain in the right knee(s) at 6 out of 10 with activity. Patient has night pain, worsening of pain with activity and weight bearing, pain that interferes with activities of daily living and pain with passive range of motion.  Patient has evidence of periarticular osteophytes and joint space narrowing by imaging studies.  There is no active infection.  Risks, benefits and expectations were discussed with the patient.  Risks including but not limited to the risk of anesthesia, blood clots, nerve damage, blood vessel damage, failure of the prosthesis, infection and up to and including death.  Patient understand the risks, benefits and expectations and wishes to proceed with surgery.   PCP: Marin Olp, MD  D/C Plans:       Home (obs)  Post-op Meds:       No Rx given   Tranexamic Acid:      To be given - IV   Decadron:      Is to be given  FYI:      Coumadin  Norco  DME:   Pt equipment arranged  PT:   OPPT arranged  [facility]    Patient Active Problem List   Diagnosis Date Noted  . Complete heart block ([place]) 04/02/2019  . Aortic atherosclerosis ([place]) 10/28/2018  . Benign microscopic hematuria 10/02/2018  . Benign localized prostatic hyperplasia with lower urinary tract symptoms (LUTS) 10/02/2018  . Nocturia 10/02/2018  . Osteoarthritis of right knee 12/19/2017  .  Long term (current) use of anticoagulants 02/18/2017  . History of adenomatous polyp of colon 09/29/2014  . BPH associated with nocturia 09/09/2014  . Encounter for therapeutic drug monitoring 09/24/2013  . History of thrombocytopenia 06/17/2011  . CAD, NATIVE VESSEL 09/27/2008  . Hx of skin cancer, basal cell 04/07/2008  . Actinic keratosis 04/07/2008  . Hyperlipidemia 03/10/2008  . PACEMAKER, PERMANENT 03/11/2007  . Atrial fibrillation ([place]) 02/11/2007  . Essential hypertension 12/02/2006  . Allergic rhinitis 12/02/2006  . GERD 12/02/2006   Past Medical History:  Diagnosis Date  . Actinic keratosis   . Adenomatous colon polyp   . ALLERGIC RHINITIS   . Arthritis   . CAD, NATIVE VESSEL    a. DES x2 to RCA 2004 b. repeat PTCA to RCA 2008 c. PCI to RCA 2012 non-DES   . Cellulitis and abscess of trunk   . Diverticulosis   . GERD (gastroesophageal reflux disease)   . HYPERLIPIDEMIA   . HYPERTENSION   . OTH MALIG NEOPLASM SKIN OTH\T\UNSPEC PARTS FACE   . Permanent atrial fibrillation ([place])   . Skin cancer   . Tachy-brady syndrome ([place])    a. s/p MDT dual chamber pacemaker implant complicated by pericardial effusion and micro-perforatation s/p lead revisions    Past Surgical History:  Procedure Laterality Date  . BACK SURGERY  2000   "for bulging discs"  . CATARACT EXTRACTION W/ INTRAOCULAR LENS  IMPLANT, BILATERAL  2012  . CORONARY ANGIOPLASTY WITH STENT PLACEMENT  2008  . CORONARY ANGIOPLASTY WITH STENT PLACEMENT  05/14/11  . CYSTECTOMY     from right side of face.  . EP IMPLANTABLE DEVICE N/A 12/16/2014   Procedure: PPM Generator Changeout;  Surgeon: Evans Lance, MD;  Location: [facility];  Service: Cardiovascular;  Laterality: N/A;  . LEAD REVISION  2008   11 days post initial implant, RA and RV lead revisions for micro-perforation and pericardial effusion by Dr Lovena Le  . LEFT HEART CATHETERIZATION WITH CORONARY ANGIOGRAM N/A 05/14/2011   Procedure: LEFT HEART  CATHETERIZATION WITH CORONARY ANGIOGRAM;  Surgeon: Hillary Bow, MD;  Location: [facility] CATH LAB;  Service: Cardiovascular;  Laterality: N/A;  . PACEMAKER INSERTION  2008   MDT dual chamber pacemaker implanted by Dr Percival Spanish    No current facility-administered medications for this encounter.   Current Outpatient Medications  Medication Sig Dispense Refill Last Dose  . atorvastatin (LIPITOR) 40 MG tablet Take 1 tablet (40 mg total) by mouth daily at 6 PM. 90 tablet 0   . diltiazem (CARDIZEM CD) 240 MG 24 hr capsule TAKE 1 CAPSULE EVERY DAY 90 capsule 0   . finasteride (PROSCAR) 5 MG tablet      . fluticasone (FLONASE) 50 MCG/ACT nasal spray USE 2 SPRAYS IN EACH NOSTRIL EVERY DAY 48 g 0   . metoprolol succinate (TOPROL-XL) 50 MG 24 hr tablet Take with or immediately following a meal. 135 tablet 0   . Multiple Vitamins-Minerals (MULTIVITAMIN & MINERAL PO) Take 1 tablet by mouth daily.     . nitroGLYCERIN (NITROSTAT) 0.4 MG SL tablet Place 1 tablet (0.4 mg total) under the tongue every 5 (five) minutes as needed for chest pain. 25 tablet 1   . omeprazole (PRILOSEC) 20 MG capsule Take 1 capsule (20 mg total) by mouth daily. 90 capsule 3   . tamsulosin (FLOMAX) 0.4 MG CAPS capsule TAKE 2 CAPSULES EVERY DAY 180 capsule 0   . warfarin (COUMADIN) 2.5 MG tablet TAKE 1 TABLET DAILY EXCEPT TAKE 2 TABLETS ON Mondays or AS DIRECTED BY THE COUMADIN CLINIC 120 tablet 0    No Known Allergies  Social History   Tobacco Use  . Smoking status: Never Smoker  . Smokeless tobacco: Never Used  Substance Use Topics  . Alcohol use: No    Comment: 05/14/11 "last occasional drink was in 1990's"    Family History  Problem Relation Age of Onset  . Ovarian cancer Mother   . Heart attack Father   . Colon cancer Father 81       deceased 46  . Diabetes Brother        x 2  . Heart disease Brother        x 3     Review of Systems  Constitutional: Negative.   HENT: Negative.   Eyes: Negative.   Respiratory:  Negative.   Cardiovascular: Negative.   Gastrointestinal: Positive for constipation and diarrhea.  Genitourinary: Negative.   Musculoskeletal: Positive for joint pain.  Skin: Negative.   Neurological: Negative.   Endo/Heme/Allergies: Negative.   Psychiatric/Behavioral: Negative.       Objective:  Physical Exam  Constitutional: He is oriented to person, place, and time. He appears well-developed.  HENT:  Head: Normocephalic.  Eyes: Pupils are equal, round, and reactive to light.  Neck: No JVD present. No tracheal deviation present. No thyromegaly present.  Cardiovascular: Normal rate, regular rhythm and intact distal pulses.  Respiratory: Effort  normal and breath sounds normal. No respiratory distress. He has no wheezes.  GI: Soft. There is no abdominal tenderness. There is no guarding.  Musculoskeletal:     Cervical back: Neck supple.     Right knee: Swelling and bony tenderness present. No deformity, erythema, ecchymosis or lacerations. Decreased range of motion. Tenderness present.  Lymphadenopathy:    He has no cervical adenopathy.  Neurological: He is alert and oriented to person, place, and time.  Skin: Skin is warm and dry.  Psychiatric: He has a normal mood and affect.      Labs:  Estimated body mass index is 31.14 kg/m as calculated from the following:   Height as of 05/04/19: 5\' 8"  (1.727 m).   Weight as of 05/04/19: 92.9 kg.   Imaging Review Plain radiographs demonstrate severe degenerative joint disease of the right knee. The bone quality appears to be good for age and reported activity level.      Assessment/Plan:  End stage arthritis, right knee   The patient history, physical examination, clinical judgment of the provider and imaging studies are consistent with end stage degenerative joint disease of the right knee(s) and total knee arthroplasty is deemed medically necessary. The treatment options including medical management, injection therapy  arthroscopy and arthroplasty were discussed at length. The risks and benefits of total knee arthroplasty were presented and reviewed. The risks due to aseptic loosening, infection, stiffness, patella tracking problems, thromboembolic complications and other imponderables were discussed. The patient acknowledged the explanation, agreed to proceed with the plan and consent was signed. Patient is being admitted for inpatient treatment for surgery, pain control, PT, OT, prophylactic antibiotics, VTE prophylaxis, progressive ambulation and ADL's and discharge planning. The patient is planning to be discharged home.    Anticipated LOS equal to or greater than 2 midnights due to - Age 106 and older with one or more of the following:  - Obesity  - Expected need for hospital services (PT, OT, Nursing) required for safe  discharge  - Anticipated need for postoperative skilled nursing care or inpatient rehab  - Active co-morbidities: Coronary Artery Disease, Heart Failure and Cardiac Arrhythmia     [place]. Derrick Orris   PA-C  07/31/2019, 12:39 PM

## 2019-08-01 NOTE — Telephone Encounter (Signed)
Looks like this has been handled.  I agree with the plans.

## 2019-08-03 NOTE — Telephone Encounter (Signed)
Cardiac clearance forwarded to requesting provider

## 2019-08-05 ENCOUNTER — Encounter (HOSPITAL_COMMUNITY)
Admission: RE | Admit: 2019-08-05 | Discharge: 2019-08-05 | Disposition: A | Discharge status: Home / Self Care | Payer: Medicare HMO | Source: Ambulatory Visit | Attending: Orthopedic Surgery | Admitting: Orthopedic Surgery

## 2019-08-05 ENCOUNTER — Encounter (HOSPITAL_COMMUNITY): Payer: Self-pay

## 2019-08-05 ENCOUNTER — Other Ambulatory Visit: Payer: Self-pay

## 2019-08-05 HISTORY — DX: Cardiac arrhythmia, unspecified: I49.9

## 2019-08-05 HISTORY — DX: Presence of cardiac pacemaker: Z95.0

## 2019-08-05 HISTORY — DX: Other specified postprocedural states: R11.2

## 2019-08-05 HISTORY — DX: Nausea with vomiting, unspecified: R11.2

## 2019-08-05 HISTORY — DX: Other specified postprocedural states: Z98.890

## 2019-08-05 NOTE — Patient Instructions (Addendum)
DUE TO COVID-19 ONLY ONE VISITOR IS ALLOWED TO COME WITH YOU AND STAY IN THE WAITING ROOM ONLY DURING PRE OP AND PROCEDURE DAY OF SURGERY. THE 1 VISITOR MAY VISIT WITH YOU AFTER SURGERY IN YOUR PRIVATE ROOM DURING VISITING HOURS ONLY!   10am- 8pm  YOU NEED TO HAVE A COVID 19 TEST ON_3-5-21______ @___215  pm____, THIS TEST MUST BE DONE BEFORE SURGERY, COME  [address], [name] , [address].  ([place]) ONCE YOUR COVID TEST IS COMPLETED, PLEASE BEGIN THE QUARANTINE INSTRUCTIONS AS OUTLINED IN YOUR HANDOUT.                REEDER PROA  08/05/2019   Your procedure is scheduled on: 08-11-19   Report to [facility] Main  Entrance   Report to admitting at            0750 AM     Call this number if you have problems the morning of surgery [phone]    Remember: NO SOLID FOOD AFTER MIDNIGHT THE NIGHT PRIOR TO SURGERY. NOTHING BY MOUTH EXCEPT CLEAR LIQUIDS UNTIL   0720 am  . PLEASE FINISH ENSURE DRINK PER SURGEON ORDER  WHICH NEEDS TO BE COMPLETED AT    0720 am then nothing by mouth .    CLEAR LIQUID DIET   Foods Allowed                                                                       Foods Excluded  Coffee and tea, regular and decaf  No creamer                           liquids that you cannot  Plain Jell-O any favor except red or purple                                           see through such as: Fruit ices (not with fruit pulp)                                                  milk, soups, orange juice  Iced Popsicles                                                      All solid food Carbonated beverages, regular and diet                                    Cranberry, grape and apple juices Sports drinks like Gatorade Lightly seasoned clear broth or consume(fat free) Sugar, honey syrup  _____________________________________________________________________     BRUSH YOUR TEETH MORNING OF SURGERY AND RINSE YOUR MOUTH OUT, NO CHEWING GUM  CANDY OR MINTS.  Take these medicines the morning of surgery with A SIP OF WATER: flonase,  omeprazole, metoprolol, finasteride, ditilizem, lipitor                                 You may not have any metal on your body including hair pins and              piercings  Do not wear jewelry, lotions, powders or perfumes, deodorant              Men may shave face and neck.   Do not bring valuables to the hospital. [place].  Contacts, dentures or bridgework may not be worn into surgery.  Leave suitcase in the car. After surgery it may be brought to your room.    Special Instructions: N/A              Please read over the following fact sheets you were given: _____________________________________________________________________             [facility] - Preparing for Surgery Before surgery, you can play an important role.  Because skin is not sterile, your skin needs to be as free of germs as possible.  You can reduce the number of germs on your skin by washing with CHG (chlorahexidine gluconate) soap before surgery.  CHG is an antiseptic cleaner which kills germs and bonds with the skin to continue killing germs even after washing. Please DO NOT use if you have an allergy to CHG or antibacterial soaps.  If your skin becomes reddened/irritated stop using the CHG and inform your nurse when you arrive at Short Stay. Do not shave (including legs and underarms) for at least 48 hours prior to the first CHG shower.  You may shave your face/neck. Please follow these instructions carefully:  1.  Shower with CHG Soap the night before surgery and the  morning of Surgery.  2.  If you choose to wash your hair, wash your hair first as usual with your  normal  shampoo.  3.  After you shampoo, rinse your hair and body thoroughly to remove the  shampoo.                           4.  Use CHG as you would any other liquid soap.  You can apply chg directly   to the skin and wash                       Gently with a scrungie or clean washcloth.  5.  Apply the CHG Soap to your body ONLY FROM THE NECK DOWN.   Do not use on face/ open                           Wound or open sores. Avoid contact with eyes, ears mouth and genitals (private parts).                       Wash face,  Genitals (private parts) with your normal soap.             6.  Wash thoroughly, paying special attention to the area where your surgery  will be performed.  7.  Thoroughly rinse your body with warm water from the neck down.  8.  DO NOT shower/wash with your normal soap after using and rinsing off  the CHG Soap.                9.  Pat yourself dry with a clean towel.            10.  Wear clean pajamas.            11.  Place clean sheets on your bed the night of your first shower and do not  sleep with pets. Day of Surgery : Do not apply any lotions/deodorants the morning of surgery.  Please wear clean clothes to the hospital/surgery center.  FAILURE TO FOLLOW THESE INSTRUCTIONS MAY RESULT IN THE CANCELLATION OF YOUR SURGERY PATIENT SIGNATURE_________________________________  NURSE SIGNATURE__________________________________  ________________________________________________________________________   Shawn [name]  An incentive spirometer is a tool that can help keep your lungs clear and active. This tool measures how well you are filling your lungs with each breath. Taking long deep breaths may help reverse or decrease the chance of developing breathing (pulmonary) problems (especially infection) following:  A long period of time when you are unable to move or be active. BEFORE THE PROCEDURE   If the spirometer includes an indicator to show your best effort, your nurse or respiratory therapist will set it to a desired goal.  If possible, sit up straight or lean slightly forward. Try not to slouch.  Hold the incentive spirometer in an upright  position. INSTRUCTIONS FOR USE  1. Sit on the edge of your bed if possible, or sit up as far as you can in bed or on a chair. 2. Hold the incentive spirometer in an upright position. 3. Breathe out normally. 4. Place the mouthpiece in your mouth and seal your lips tightly around it. 5. Breathe in slowly and as deeply as possible, raising the piston or the ball toward the top of the column. 6. Hold your breath for 3-5 seconds or for as long as possible. Allow the piston or ball to fall to the bottom of the column. 7. Remove the mouthpiece from your mouth and breathe out normally. 8. Rest for a few seconds and repeat Steps 1 through 7 at least 10 times every 1-2 hours when you are awake. Take your time and take a few normal breaths between deep breaths. 9. The spirometer may include an indicator to show your best effort. Use the indicator as a goal to work toward during each repetition. 10. After each set of 10 deep breaths, practice coughing to be sure your lungs are clear. If you have an incision (the cut made at the time of surgery), support your incision when coughing by placing a pillow or rolled up towels firmly against it. Once you are able to get out of bed, walk around indoors and cough well. You may stop using the incentive spirometer when instructed by your caregiver.  RISKS AND COMPLICATIONS  Take your time so you do not get dizzy or light-headed.  If you are in pain, you may need to take or ask for pain medication before doing incentive spirometry. It is harder to take a deep breath if you are having pain. AFTER USE  Rest and breathe slowly and easily.  It can be helpful to keep track of a log of your progress. Your caregiver can provide you with a simple table to help with this. If you are using  the spirometer at home, follow these instructions: [place] IF:   You are having difficultly using the spirometer.  You have trouble using the spirometer as often as  instructed.  Your pain medication is not giving enough relief while using the spirometer.  You develop fever of 100.5 F (38.1 C) or higher. SEEK IMMEDIATE MEDICAL CARE IF:   You cough up bloody sputum that had not been present before.  You develop fever of 102 F (38.9 C) or greater.  You develop worsening pain at or near the incision site. MAKE SURE YOU:   Understand these instructions.  Will watch your condition.  Will get help right away if you are not doing well or get worse. Document Released: 10/01/2006 Document Revised: 08/13/2011 Document Reviewed: 12/02/2006 Exit[employer] Patient Information 2014 Exit[employer], [place].   ________________________________________________________________________  WHAT IS A BLOOD TRANSFUSION? Blood Transfusion Information  A transfusion is the replacement of blood or some of its parts. Blood is made up of multiple cells which provide different functions.  Red blood cells carry oxygen and are used for blood loss replacement.  White blood cells fight against infection.  Platelets control bleeding.  Plasma helps clot blood.  Other blood products are available for specialized needs, such as hemophilia or other clotting disorders. BEFORE THE TRANSFUSION  Who gives blood for transfusions?   Healthy volunteers who are fully evaluated to make sure their blood is safe. This is blood bank blood. Transfusion therapy is the safest it has ever been in the practice of medicine. Before blood is taken from a donor, a complete history is taken to make sure that person has no history of diseases nor engages in risky social behavior (examples are intravenous drug use or sexual activity with multiple partners). The donor's travel history is screened to minimize risk of transmitting infections, such as malaria. The donated blood is tested for signs of infectious diseases, such as HIV and hepatitis. The blood is then tested to be sure it is compatible with you in  order to minimize the chance of a transfusion reaction. If you or a relative donates blood, this is often done in anticipation of surgery and is not appropriate for emergency situations. It takes many days to process the donated blood. RISKS AND COMPLICATIONS Although transfusion therapy is very safe and saves many lives, the main dangers of transfusion include:   Getting an infectious disease.  Developing a transfusion reaction. This is an allergic reaction to something in the blood you were given. Every precaution is taken to prevent this. The decision to have a blood transfusion has been considered carefully by your caregiver before blood is given. Blood is not given unless the benefits outweigh the risks. AFTER THE TRANSFUSION  Right after receiving a blood transfusion, you will usually feel much better and more energetic. This is especially true if your red blood cells have gotten low (anemic). The transfusion raises the level of the red blood cells which carry oxygen, and this usually causes an energy increase.  The nurse administering the transfusion will monitor you carefully for complications. HOME CARE INSTRUCTIONS  No special instructions are needed after a transfusion. You may find your energy is better. Speak with your caregiver about any limitations on activity for underlying diseases you may have. SEEK MEDICAL CARE IF:   Your condition is not improving after your transfusion.  You develop redness or irritation at the intravenous (IV) site. SEEK IMMEDIATE MEDICAL CARE IF:  Any of the following symptoms occur  over the next 12 hours:  Shaking chills.  You have a temperature by mouth above 102 F (38.9 C), not controlled by medicine.  Chest, back, or muscle pain.  People around you feel you are not acting correctly or are confused.  Shortness of breath or difficulty breathing.  Dizziness and fainting.  You get a rash or develop hives.  You have a decrease in urine  output.  Your urine turns a dark color or changes to pink, red, or brown. Any of the following symptoms occur over the next 10 days:  You have a temperature by mouth above 102 F (38.9 C), not controlled by medicine.  Shortness of breath.  Weakness after normal activity.  The white part of the eye turns yellow (jaundice).  You have a decrease in the amount of urine or are urinating less often.  Your urine turns a dark color or changes to pink, red, or brown. Document Released: 05/18/2000 Document Revised: 08/13/2011 Document Reviewed: 01/05/2008 [facility] Patient Information 2014 [place], [place].  _______________________________________________________________________

## 2019-08-06 NOTE — Progress Notes (Signed)
Anesthesia Chart Review   Case: [id number] Date/Time: 08/11/19 1005   Procedure: TOTAL KNEE ARTHROPLASTY (Right Knee) - 70 mins   Anesthesia type: Spinal   Pre-op diagnosis: Right knee osteoarthritis   Location: [facility] ROOM 09 / [facility]   Surgeons: Paralee Cancel, MD      DISCUSSION:85 y.o. never smoker with h/o PONV, atrial fibrillation (on Coumadin), HTN, GERD, pacemaker, CAD (DES x 2), right knee OA scheduled for above procedure 08/11/19 with Dr. Paralee Cancel.    Instructions to hold Coumadin given to pt, last dose 08/04/2019.   Cleared by cardiology 07/30/2019.  Per Leanor Kail, PA-C, "Patient was contacted 07/30/2019 in reference to pre-operative risk assessment for pending surgery as outlined below.  Shawn [name] was last seen on 05/04/19 by Dr. Percival Spanish.  Since that day, Shawn [name] has done well. Therefore, based on ACC/AHA guidelines, the patient would be at acceptable risk for the planned procedure without further cardiovascular testing."  Anticipate pt can proceed with planned procedure barring acute status change.   VS: Ht 5\' 6"  (1.676 m)   Wt 90.7 kg   BMI 32.28 kg/m   PROVIDERS: Marin Olp, MD is PCP   Minus Breeding, MD is Cardiologist  LABS: labs pending  (all labs ordered are listed, but only abnormal results are displayed)  Labs Reviewed - No data to display   IMAGES:   EKG: 04/02/2019 Rate 89 bpm  Ventricular paced rhythm   CV:  Past Medical History:  Diagnosis Date  . Actinic keratosis   . Adenomatous colon polyp   . ALLERGIC RHINITIS   . Arthritis   . CAD, NATIVE VESSEL    a. DES x2 to RCA 2004 b. repeat PTCA to RCA 2008 c. PCI to RCA 2012 non-DES   . Cellulitis and abscess of trunk   . Diverticulosis   . Dysrhythmia   . GERD (gastroesophageal reflux disease)   . HYPERLIPIDEMIA   . HYPERTENSION   . OTH MALIG NEOPLASM SKIN OTH\T\UNSPEC PARTS FACE   . Permanent atrial fibrillation ([place])   . PONV (postoperative nausea and  vomiting)   . Presence of permanent cardiac pacemaker   . Skin cancer   . Tachy-brady syndrome ([place])    a. s/p MDT dual chamber pacemaker implant complicated by pericardial effusion and micro-perforatation s/p lead revisions    Past Surgical History:  Procedure Laterality Date  . BACK SURGERY  2000   "for bulging discs"  . CATARACT EXTRACTION W/ INTRAOCULAR LENS  IMPLANT, BILATERAL  2012  . CORONARY ANGIOPLASTY WITH STENT PLACEMENT  2008  . CORONARY ANGIOPLASTY WITH STENT PLACEMENT  05/14/11  . CYSTECTOMY     from right side of face.  . EP IMPLANTABLE DEVICE N/A 12/16/2014   Procedure: PPM Generator Changeout;  Surgeon: Evans Lance, MD;  Location: [place] [facility];  Service: Cardiovascular;  Laterality: N/A;  . LEAD REVISION  2008   11 days post initial implant, RA and RV lead revisions for micro-perforation and pericardial effusion by Dr Lovena Le  . LEFT HEART CATHETERIZATION WITH CORONARY ANGIOGRAM N/A 05/14/2011   Procedure: LEFT HEART CATHETERIZATION WITH CORONARY ANGIOGRAM;  Surgeon: Hillary Bow, MD;  Location: [facility] CATH LAB;  Service: Cardiovascular;  Laterality: N/A;  . PACEMAKER INSERTION  2008   MDT dual chamber pacemaker implanted by Dr Percival Spanish    MEDICATIONS: . acetaminophen (TYLENOL) 325 MG tablet  . atorvastatin (LIPITOR) 40 MG tablet  . diltiazem (CARDIZEM CD) 240 MG 24 hr capsule  .  finasteride (PROSCAR) 5 MG tablet  . fluticasone (FLONASE) 50 MCG/ACT nasal spray  . metoprolol succinate (TOPROL-XL) 50 MG 24 hr tablet  . Multiple Vitamins-Minerals (MULTIVITAMIN & MINERAL PO)  . nitroGLYCERIN (NITROSTAT) 0.4 MG SL tablet  . omeprazole (PRILOSEC) 20 MG capsule  . tamsulosin (FLOMAX) 0.4 MG CAPS capsule  . warfarin (COUMADIN) 2.5 MG tablet   No current facility-administered medications for this encounter.   Maia Plan [facility] Pre-Surgical Testing [phone] 08/06/19  2:41 PM

## 2019-08-07 ENCOUNTER — Encounter (HOSPITAL_COMMUNITY)
Admission: RE | Admit: 2019-08-07 | Discharge: 2019-08-07 | Disposition: A | Discharge status: Home / Self Care | Payer: Medicare HMO | Source: Ambulatory Visit | Attending: Orthopedic Surgery | Admitting: Orthopedic Surgery

## 2019-08-07 ENCOUNTER — Other Ambulatory Visit (HOSPITAL_COMMUNITY)
Admission: RE | Admit: 2019-08-07 | Discharge: 2019-08-07 | Disposition: A | Discharge status: Home / Self Care | Payer: Medicare HMO | Source: Ambulatory Visit | Attending: Cardiology | Admitting: Cardiology

## 2019-08-07 ENCOUNTER — Other Ambulatory Visit: Payer: Self-pay

## 2019-08-07 DIAGNOSIS — Z01812 Encounter for preprocedural laboratory examination: Secondary | ICD-10-CM | POA: Insufficient documentation

## 2019-08-07 DIAGNOSIS — Z20822 Contact with and (suspected) exposure to covid-19: Secondary | ICD-10-CM | POA: Diagnosis not present

## 2019-08-07 LAB — SURGICAL PCR SCREEN
MRSA, PCR: NEGATIVE
Staphylococcus aureus: POSITIVE — AB

## 2019-08-07 LAB — BASIC METABOLIC PANEL
Anion gap: 7 (ref 5–15)
BUN: 15 mg/dL (ref 8–23)
CO2: 29 mmol/L (ref 22–32)
Calcium: 9.2 mg/dL (ref 8.9–10.3)
Chloride: 105 mmol/L (ref 98–111)
Creatinine, Ser: 1.03 mg/dL (ref 0.61–1.24)
GFR calc Af Amer: >60 mL/min (ref >60)
GFR calc non Af Amer: >60 mL/min (ref >60)
Glucose, Bld: 131 mg/dL — ABNORMAL HIGH (ref 70–99)
Potassium: 4.5 mmol/L (ref 3.5–5.1)
Sodium: 141 mmol/L (ref 135–145)

## 2019-08-07 LAB — CBC
HCT: 44.5 % (ref 39.0–52.0)
Hemoglobin: 14.6 g/dL (ref 13.0–17.0)
MCH: 31.2 pg (ref 26.0–34.0)
MCHC: 32.8 g/dL (ref 30.0–36.0)
MCV: 95.1 fL (ref 80.0–100.0)
Platelets: 174 10*3/uL (ref 150–400)
RBC: 4.68 MIL/uL (ref 4.22–5.81)
RDW: 13.2 % (ref 11.5–15.5)
WBC: 8.6 10*3/uL (ref 4.0–10.5)
nRBC: 0 % (ref 0.0–0.2)

## 2019-08-07 LAB — PROTIME-INR
INR: 1.2 (ref 0.8–1.2)
Prothrombin Time: 14.7 seconds (ref 11.4–15.2)

## 2019-08-07 LAB — ABO/RH: ABO/RH(D): A NEG

## 2019-08-08 LAB — SARS CORONAVIRUS 2 (TAT 6-24 HRS): SARS Coronavirus 2: NEGATIVE

## 2019-08-11 ENCOUNTER — Encounter (HOSPITAL_COMMUNITY): Payer: Self-pay | Admitting: Orthopedic Surgery

## 2019-08-11 ENCOUNTER — Ambulatory Visit (HOSPITAL_COMMUNITY): Payer: Medicare HMO | Admitting: Physician Assistant

## 2019-08-11 ENCOUNTER — Other Ambulatory Visit: Payer: Self-pay

## 2019-08-11 ENCOUNTER — Encounter (HOSPITAL_COMMUNITY)
Admission: AD | Disposition: A | Discharge status: Home / Self Care | Payer: Self-pay | Source: Home / Self Care | Attending: Orthopedic Surgery

## 2019-08-11 ENCOUNTER — Observation Stay (HOSPITAL_COMMUNITY)
Admission: AD | Admit: 2019-08-11 | Discharge: 2019-08-12 | Disposition: A | Discharge status: Home / Self Care | Payer: Medicare HMO | Attending: Orthopedic Surgery | Admitting: Orthopedic Surgery

## 2019-08-11 ENCOUNTER — Ambulatory Visit (HOSPITAL_COMMUNITY): Payer: Medicare HMO | Admitting: Anesthesiology

## 2019-08-11 DIAGNOSIS — G8918 Other acute postprocedural pain: Secondary | ICD-10-CM | POA: Diagnosis not present

## 2019-08-11 DIAGNOSIS — I10 Essential (primary) hypertension: Secondary | ICD-10-CM | POA: Insufficient documentation

## 2019-08-11 DIAGNOSIS — E785 Hyperlipidemia, unspecified: Secondary | ICD-10-CM | POA: Insufficient documentation

## 2019-08-11 DIAGNOSIS — I4821 Permanent atrial fibrillation: Secondary | ICD-10-CM | POA: Insufficient documentation

## 2019-08-11 DIAGNOSIS — Z79899 Other long term (current) drug therapy: Secondary | ICD-10-CM | POA: Insufficient documentation

## 2019-08-11 DIAGNOSIS — M1711 Unilateral primary osteoarthritis, right knee: Principal | ICD-10-CM | POA: Insufficient documentation

## 2019-08-11 DIAGNOSIS — Z955 Presence of coronary angioplasty implant and graft: Secondary | ICD-10-CM | POA: Diagnosis not present

## 2019-08-11 DIAGNOSIS — I442 Atrioventricular block, complete: Secondary | ICD-10-CM | POA: Insufficient documentation

## 2019-08-11 DIAGNOSIS — E669 Obesity, unspecified: Secondary | ICD-10-CM | POA: Diagnosis present

## 2019-08-11 DIAGNOSIS — K219 Gastro-esophageal reflux disease without esophagitis: Secondary | ICD-10-CM | POA: Insufficient documentation

## 2019-08-11 DIAGNOSIS — M659 Synovitis and tenosynovitis, unspecified: Secondary | ICD-10-CM | POA: Diagnosis not present

## 2019-08-11 DIAGNOSIS — M25461 Effusion, right knee: Secondary | ICD-10-CM | POA: Insufficient documentation

## 2019-08-11 DIAGNOSIS — Z85828 Personal history of other malignant neoplasm of skin: Secondary | ICD-10-CM | POA: Diagnosis not present

## 2019-08-11 DIAGNOSIS — Z950 Presence of cardiac pacemaker: Secondary | ICD-10-CM | POA: Diagnosis not present

## 2019-08-11 DIAGNOSIS — I2510 Atherosclerotic heart disease of native coronary artery without angina pectoris: Secondary | ICD-10-CM | POA: Diagnosis not present

## 2019-08-11 DIAGNOSIS — I700 Atherosclerosis of aorta: Secondary | ICD-10-CM | POA: Insufficient documentation

## 2019-08-11 DIAGNOSIS — Z96651 Presence of right artificial knee joint: Secondary | ICD-10-CM

## 2019-08-11 DIAGNOSIS — Z7901 Long term (current) use of anticoagulants: Secondary | ICD-10-CM | POA: Diagnosis not present

## 2019-08-11 HISTORY — PX: TOTAL KNEE ARTHROPLASTY: SHX125

## 2019-08-11 LAB — CBC
HCT: 43.9 % (ref 39.0–52.0)
Hemoglobin: 14.3 g/dL (ref 13.0–17.0)
MCH: 31.4 pg (ref 26.0–34.0)
MCHC: 32.6 g/dL (ref 30.0–36.0)
MCV: 96.5 fL (ref 80.0–100.0)
Platelets: 144 10*3/uL — ABNORMAL LOW (ref 150–400)
RBC: 4.55 MIL/uL (ref 4.22–5.81)
RDW: 13.2 % (ref 11.5–15.5)
WBC: 9.3 10*3/uL (ref 4.0–10.5)
nRBC: 0 % (ref 0.0–0.2)

## 2019-08-11 LAB — TYPE AND SCREEN
ABO/RH(D): A NEG
Antibody Screen: NEGATIVE

## 2019-08-11 LAB — CREATININE, SERUM
Creatinine, Ser: 0.96 mg/dL (ref 0.61–1.24)
GFR calc Af Amer: >60 mL/min (ref >60)
GFR calc non Af Amer: >60 mL/min (ref >60)

## 2019-08-11 SURGERY — ARTHROPLASTY, KNEE, TOTAL
Anesthesia: Regional | Site: Knee | Laterality: Right

## 2019-08-11 MED ORDER — FERROUS SULFATE 325 (65 FE) MG PO TABS
325.0000 mg | ORAL_TABLET | Freq: Two times a day (BID) | ORAL | Status: DC
  Administered 2019-08-12: 09:00:00 325 mg via ORAL
  Filled 2019-08-11: qty 1

## 2019-08-11 MED ORDER — ENOXAPARIN SODIUM 40 MG/0.4ML ~~LOC~~ SOLN
40.0000 mg | SUBCUTANEOUS | Status: DC
  Filled 2019-08-11: qty 0.4

## 2019-08-11 MED ORDER — ONDANSETRON HCL 4 MG/2ML IJ SOLN
4.0000 mg | Freq: Four times a day (QID) | INTRAMUSCULAR | Status: DC | PRN
  Administered 2019-08-12: 09:00:00 4 mg via INTRAVENOUS
  Filled 2019-08-11: qty 2

## 2019-08-11 MED ORDER — POLYETHYLENE GLYCOL 3350 17 G PO PACK
17.0000 g | PACK | Freq: Two times a day (BID) | ORAL | Status: DC
  Filled 2019-08-11: qty 1

## 2019-08-11 MED ORDER — FENTANYL CITRATE (PF) 100 MCG/2ML IJ SOLN: 25.0000 ug | INTRAMUSCULAR | Status: DC | PRN

## 2019-08-11 MED ORDER — TRANEXAMIC ACID-NACL 1000-0.7 MG/100ML-% IV SOLN
1000.0000 mg | INTRAVENOUS | Status: AC
  Administered 2019-08-11: 1000 mg via INTRAVENOUS
  Filled 2019-08-11: qty 100

## 2019-08-11 MED ORDER — TRANEXAMIC ACID-NACL 1000-0.7 MG/100ML-% IV SOLN
1000.0000 mg | Freq: Once | INTRAVENOUS | Status: AC
  Administered 2019-08-11: 14:00:00 1000 mg via INTRAVENOUS
  Filled 2019-08-11: qty 100

## 2019-08-11 MED ORDER — DEXAMETHASONE SODIUM PHOSPHATE 10 MG/ML IJ SOLN
INTRAMUSCULAR | Status: DC | PRN
  Administered 2019-08-11: 10 mg via INTRAVENOUS

## 2019-08-11 MED ORDER — NON FORMULARY: 20.0000 mg | Freq: Every day | Status: DC

## 2019-08-11 MED ORDER — PROPOFOL 500 MG/50ML IV EMUL
INTRAVENOUS | Status: AC
  Filled 2019-08-11: qty 50

## 2019-08-11 MED ORDER — FINASTERIDE 5 MG PO TABS
5.0000 mg | ORAL_TABLET | Freq: Every day | ORAL | Status: DC
  Administered 2019-08-12: 09:00:00 5 mg via ORAL
  Filled 2019-08-11: qty 1

## 2019-08-11 MED ORDER — DEXAMETHASONE SODIUM PHOSPHATE 10 MG/ML IJ SOLN
10.0000 mg | Freq: Once | INTRAMUSCULAR | Status: AC
  Administered 2019-08-12: 10 mg via INTRAVENOUS
  Filled 2019-08-11: qty 1

## 2019-08-11 MED ORDER — FENTANYL CITRATE (PF) 100 MCG/2ML IJ SOLN
50.0000 ug | INTRAMUSCULAR | Status: DC
  Administered 2019-08-11: 50 ug via INTRAVENOUS
  Filled 2019-08-11: qty 2

## 2019-08-11 MED ORDER — FLUTICASONE PROPIONATE 50 MCG/ACT NA SUSP
2.0000 | Freq: Every day | NASAL | Status: DC
  Filled 2019-08-11: qty 16

## 2019-08-11 MED ORDER — ONDANSETRON HCL 4 MG/2ML IJ SOLN
INTRAMUSCULAR | Status: AC
  Filled 2019-08-11: qty 2

## 2019-08-11 MED ORDER — BISACODYL 10 MG RE SUPP: 10.0000 mg | Freq: Every day | RECTAL | Status: DC | PRN

## 2019-08-11 MED ORDER — OMEPRAZOLE 20 MG PO CPDR
20.0000 mg | DELAYED_RELEASE_CAPSULE | Freq: Every day | ORAL | Status: DC
  Administered 2019-08-11 – 2019-08-12 (×2): 20 mg via ORAL
  Filled 2019-08-11 (×2): qty 1

## 2019-08-11 MED ORDER — ONDANSETRON HCL 4 MG/2ML IJ SOLN
INTRAMUSCULAR | Status: DC | PRN
  Administered 2019-08-11: 4 mg via INTRAVENOUS

## 2019-08-11 MED ORDER — PROPOFOL 500 MG/50ML IV EMUL
INTRAVENOUS | Status: DC | PRN
  Administered 2019-08-11: 50 ug/kg/min via INTRAVENOUS

## 2019-08-11 MED ORDER — METHOCARBAMOL 500 MG IVPB - SIMPLE MED
500.0000 mg | Freq: Four times a day (QID) | INTRAVENOUS | Status: DC | PRN
  Filled 2019-08-11: qty 50

## 2019-08-11 MED ORDER — KETOROLAC TROMETHAMINE 30 MG/ML IJ SOLN
INTRAMUSCULAR | Status: AC
  Filled 2019-08-11: qty 1

## 2019-08-11 MED ORDER — SODIUM CHLORIDE (PF) 0.9 % IJ SOLN
INTRAMUSCULAR | Status: DC | PRN
  Administered 2019-08-11: 30 mL

## 2019-08-11 MED ORDER — DILTIAZEM HCL ER COATED BEADS 240 MG PO CP24
240.0000 mg | ORAL_CAPSULE | Freq: Every day | ORAL | Status: DC
  Administered 2019-08-12: 09:00:00 240 mg via ORAL
  Filled 2019-08-11: qty 1

## 2019-08-11 MED ORDER — PHENYLEPHRINE 40 MCG/ML (10ML) SYRINGE FOR IV PUSH (FOR BLOOD PRESSURE SUPPORT)
PREFILLED_SYRINGE | INTRAVENOUS | Status: AC
  Filled 2019-08-11: qty 10

## 2019-08-11 MED ORDER — ALUM & MAG HYDROXIDE-SIMETH 200-200-20 MG/5ML PO SUSP: 15.0000 mL | ORAL | Status: DC | PRN

## 2019-08-11 MED ORDER — SODIUM CHLORIDE 0.9 % IR SOLN
Status: DC | PRN
  Administered 2019-08-11: 1000 mL

## 2019-08-11 MED ORDER — CEFAZOLIN SODIUM-DEXTROSE 2-4 GM/100ML-% IV SOLN
2.0000 g | Freq: Four times a day (QID) | INTRAVENOUS | Status: AC
  Administered 2019-08-11 (×2): 2 g via INTRAVENOUS
  Filled 2019-08-11 (×2): qty 100

## 2019-08-11 MED ORDER — PROPOFOL 10 MG/ML IV BOLUS
INTRAVENOUS | Status: DC | PRN
  Administered 2019-08-11 (×3): 20 mg via INTRAVENOUS

## 2019-08-11 MED ORDER — ATORVASTATIN CALCIUM 40 MG PO TABS: 40.0000 mg | ORAL_TABLET | Freq: Every day | ORAL | Status: DC

## 2019-08-11 MED ORDER — MAGNESIUM CITRATE PO SOLN: 1.0000 | Freq: Once | ORAL | Status: DC | PRN

## 2019-08-11 MED ORDER — LIDOCAINE 2% (20 MG/ML) 5 ML SYRINGE
INTRAMUSCULAR | Status: AC
  Filled 2019-08-11: qty 5

## 2019-08-11 MED ORDER — PHENYLEPHRINE HCL-NACL 10-0.9 MG/250ML-% IV SOLN
INTRAVENOUS | Status: DC | PRN
  Administered 2019-08-11: 40 ug/min via INTRAVENOUS

## 2019-08-11 MED ORDER — ONDANSETRON HCL 4 MG PO TABS: 4.0000 mg | ORAL_TABLET | Freq: Four times a day (QID) | ORAL | Status: DC | PRN

## 2019-08-11 MED ORDER — BUPIVACAINE HCL (PF) 0.25 % IJ SOLN
INTRAMUSCULAR | Status: DC | PRN
  Administered 2019-08-11: 30 mL

## 2019-08-11 MED ORDER — 0.9 % SODIUM CHLORIDE (POUR BTL) OPTIME
TOPICAL | Status: DC | PRN
  Administered 2019-08-11: 1000 mL

## 2019-08-11 MED ORDER — TAMSULOSIN HCL 0.4 MG PO CAPS
0.8000 mg | ORAL_CAPSULE | Freq: Every day | ORAL | Status: DC
  Administered 2019-08-11: 17:00:00 0.8 mg via ORAL
  Filled 2019-08-11 (×2): qty 2

## 2019-08-11 MED ORDER — DIPHENHYDRAMINE HCL 12.5 MG/5ML PO ELIX: 12.5000 mg | ORAL_SOLUTION | ORAL | Status: DC | PRN

## 2019-08-11 MED ORDER — METOPROLOL SUCCINATE ER 50 MG PO TB24
75.0000 mg | ORAL_TABLET | Freq: Every day | ORAL | Status: DC
  Administered 2019-08-12: 75 mg via ORAL
  Filled 2019-08-11: qty 1

## 2019-08-11 MED ORDER — PHENYLEPHRINE HCL (PRESSORS) 10 MG/ML IV SOLN
INTRAVENOUS | Status: AC
  Filled 2019-08-11: qty 1

## 2019-08-11 MED ORDER — METOCLOPRAMIDE HCL 5 MG/ML IJ SOLN: 5.0000 mg | Freq: Three times a day (TID) | INTRAMUSCULAR | Status: DC | PRN

## 2019-08-11 MED ORDER — KETOROLAC TROMETHAMINE 30 MG/ML IJ SOLN
INTRAMUSCULAR | Status: DC | PRN
  Administered 2019-08-11: 30 mg

## 2019-08-11 MED ORDER — MENTHOL 3 MG MT LOZG: 1.0000 | LOZENGE | OROMUCOSAL | Status: DC | PRN

## 2019-08-11 MED ORDER — ACETAMINOPHEN 500 MG PO TABS
1000.0000 mg | ORAL_TABLET | Freq: Once | ORAL | Status: AC
  Administered 2019-08-11: 1000 mg via ORAL
  Filled 2019-08-11: qty 2

## 2019-08-11 MED ORDER — ROPIVACAINE HCL 5 MG/ML IJ SOLN
INTRAMUSCULAR | Status: DC | PRN
  Administered 2019-08-11: 30 mL via PERINEURAL

## 2019-08-11 MED ORDER — HYDROCODONE-ACETAMINOPHEN 5-325 MG PO TABS
1.0000 | ORAL_TABLET | ORAL | Status: DC | PRN
  Administered 2019-08-11: 2 via ORAL
  Administered 2019-08-11: 17:00:00 1 via ORAL
  Administered 2019-08-12: 2 via ORAL
  Filled 2019-08-11 (×2): qty 2
  Filled 2019-08-11: qty 1

## 2019-08-11 MED ORDER — STERILE WATER FOR IRRIGATION IR SOLN
Status: DC | PRN
  Administered 2019-08-11: 2000 mL

## 2019-08-11 MED ORDER — POVIDONE-IODINE 10 % EX SWAB
2.0000 "application " | Freq: Once | CUTANEOUS | Status: AC
  Administered 2019-08-11: 2 via TOPICAL

## 2019-08-11 MED ORDER — BUPIVACAINE IN DEXTROSE 0.75-8.25 % IT SOLN
INTRATHECAL | Status: DC | PRN
  Administered 2019-08-11: 1.6 mL via INTRATHECAL

## 2019-08-11 MED ORDER — ACETAMINOPHEN 325 MG PO TABS: 325.0000 mg | ORAL_TABLET | Freq: Four times a day (QID) | ORAL | Status: DC | PRN

## 2019-08-11 MED ORDER — DOCUSATE SODIUM 100 MG PO CAPS
100.0000 mg | ORAL_CAPSULE | Freq: Two times a day (BID) | ORAL | Status: DC
  Administered 2019-08-11 – 2019-08-12 (×2): 100 mg via ORAL
  Filled 2019-08-11 (×2): qty 1

## 2019-08-11 MED ORDER — CHLORHEXIDINE GLUCONATE 4 % EX LIQD: 60.0000 mL | Freq: Once | CUTANEOUS | Status: DC

## 2019-08-11 MED ORDER — SODIUM CHLORIDE (PF) 0.9 % IJ SOLN
INTRAMUSCULAR | Status: AC
  Filled 2019-08-11: qty 50

## 2019-08-11 MED ORDER — WARFARIN SODIUM 5 MG PO TABS
5.0000 mg | ORAL_TABLET | Freq: Once | ORAL | Status: DC
  Filled 2019-08-11 (×2): qty 1

## 2019-08-11 MED ORDER — BUPIVACAINE HCL (PF) 0.25 % IJ SOLN
INTRAMUSCULAR | Status: AC
  Filled 2019-08-11: qty 30

## 2019-08-11 MED ORDER — METHOCARBAMOL 500 MG PO TABS
500.0000 mg | ORAL_TABLET | Freq: Four times a day (QID) | ORAL | Status: DC | PRN
  Administered 2019-08-11 – 2019-08-12 (×2): 500 mg via ORAL
  Filled 2019-08-11 (×2): qty 1

## 2019-08-11 MED ORDER — SODIUM CHLORIDE 0.9 % IV SOLN: INTRAVENOUS | Status: DC

## 2019-08-11 MED ORDER — ONDANSETRON HCL 4 MG/2ML IJ SOLN: 4.0000 mg | Freq: Once | INTRAMUSCULAR | Status: DC | PRN

## 2019-08-11 MED ORDER — PHENYLEPHRINE 40 MCG/ML (10ML) SYRINGE FOR IV PUSH (FOR BLOOD PRESSURE SUPPORT)
PREFILLED_SYRINGE | INTRAVENOUS | Status: DC | PRN
  Administered 2019-08-11: 80 ug via INTRAVENOUS
  Administered 2019-08-11: 40 ug via INTRAVENOUS
  Administered 2019-08-11: 80 ug via INTRAVENOUS
  Administered 2019-08-11: 40 ug via INTRAVENOUS

## 2019-08-11 MED ORDER — WARFARIN - PHARMACIST DOSING INPATIENT: Freq: Every day | Status: DC

## 2019-08-11 MED ORDER — PHENOL 1.4 % MT LIQD: 1.0000 | OROMUCOSAL | Status: DC | PRN

## 2019-08-11 MED ORDER — LACTATED RINGERS IV SOLN: INTRAVENOUS | Status: DC

## 2019-08-11 MED ORDER — DEXAMETHASONE SODIUM PHOSPHATE 10 MG/ML IJ SOLN: 10.0000 mg | Freq: Once | INTRAMUSCULAR | Status: DC

## 2019-08-11 MED ORDER — HYDROMORPHONE HCL 1 MG/ML IJ SOLN: 0.5000 mg | INTRAMUSCULAR | Status: DC | PRN

## 2019-08-11 MED ORDER — DEXAMETHASONE SODIUM PHOSPHATE 10 MG/ML IJ SOLN
INTRAMUSCULAR | Status: AC
  Filled 2019-08-11: qty 1

## 2019-08-11 MED ORDER — CEFAZOLIN SODIUM-DEXTROSE 2-4 GM/100ML-% IV SOLN
2.0000 g | INTRAVENOUS | Status: AC
  Administered 2019-08-11: 10:00:00 2 g via INTRAVENOUS
  Filled 2019-08-11: qty 100

## 2019-08-11 MED ORDER — METOCLOPRAMIDE HCL 5 MG PO TABS: 5.0000 mg | ORAL_TABLET | Freq: Three times a day (TID) | ORAL | Status: DC | PRN

## 2019-08-11 MED ORDER — HYDROCODONE-ACETAMINOPHEN 7.5-325 MG PO TABS
1.0000 | ORAL_TABLET | ORAL | Status: DC | PRN
  Administered 2019-08-12: 1 via ORAL
  Filled 2019-08-11: qty 1

## 2019-08-11 MED ORDER — MIDAZOLAM HCL 2 MG/2ML IJ SOLN
1.0000 mg | INTRAMUSCULAR | Status: DC
  Filled 2019-08-11: qty 2

## 2019-08-11 MED ORDER — LACTATED RINGERS IV SOLN: INTRAVENOUS | Status: DC | PRN

## 2019-08-11 MED ORDER — NITROGLYCERIN 0.4 MG SL SUBL: 0.4000 mg | SUBLINGUAL_TABLET | SUBLINGUAL | Status: DC | PRN

## 2019-08-11 SURGICAL SUPPLY — 61 items
ATTUNE MED ANAT PAT 35 KNEE (Knees) ×2 IMPLANT
ATTUNE PS FEM RT SZ 4 CEM KNEE (Femur) ×2 IMPLANT
ATTUNE PSRP INSR SZ4 6 KNEE (Insert) ×2 IMPLANT
BAG ZIPLOCK 12X15 (MISCELLANEOUS) IMPLANT
BASE TIBIAL ROT PLAT SZ 5 KNEE (Knees) ×1 IMPLANT
BLADE SAW SGTL 11.0X1.19X90.0M (BLADE) IMPLANT
BLADE SAW SGTL 13.0X1.19X90.0M (BLADE) ×2 IMPLANT
BLADE SURG SZ10 CARB STEEL (BLADE) ×4 IMPLANT
BNDG ELASTIC 6X5.8 VLCR STR LF (GAUZE/BANDAGES/DRESSINGS) ×2 IMPLANT
BOWL SMART MIX CTS (DISPOSABLE) ×2 IMPLANT
CEMENT HV SMART SET (Cement) ×4 IMPLANT
COVER SURGICAL LIGHT HANDLE (MISCELLANEOUS) ×2 IMPLANT
COVER WAND RF STERILE (DRAPES) IMPLANT
CUFF TOURN SGL QUICK 34 (TOURNIQUET CUFF) ×2
CUFF TRNQT CYL 34X4.125X (TOURNIQUET CUFF) ×1 IMPLANT
DECANTER SPIKE VIAL GLASS SM (MISCELLANEOUS) ×4 IMPLANT
DERMABOND ADVANCED (GAUZE/BANDAGES/DRESSINGS) ×1
DERMABOND ADVANCED .7 DNX12 (GAUZE/BANDAGES/DRESSINGS) ×1 IMPLANT
DRAPE U-SHAPE 47X51 STRL (DRAPES) ×2 IMPLANT
DRESSING AQUACEL AG SP 3.5X10 (GAUZE/BANDAGES/DRESSINGS) ×1 IMPLANT
DRSG AQUACEL AG ADV 3.5X10 (GAUZE/BANDAGES/DRESSINGS) ×2 IMPLANT
DRSG AQUACEL AG SP 3.5X10 (GAUZE/BANDAGES/DRESSINGS) ×2
DURAPREP 26ML APPLICATOR (WOUND CARE) ×4 IMPLANT
ELECT REM PT RETURN 15FT ADLT (MISCELLANEOUS) ×2 IMPLANT
GLOVE BIO SURGEON STRL SZ 6 (GLOVE) ×2 IMPLANT
GLOVE BIOGEL PI IND STRL 6.5 (GLOVE) ×1 IMPLANT
GLOVE BIOGEL PI IND STRL 7.5 (GLOVE) ×1 IMPLANT
GLOVE BIOGEL PI IND STRL 8.5 (GLOVE) ×1 IMPLANT
GLOVE BIOGEL PI INDICATOR 6.5 (GLOVE) ×1
GLOVE BIOGEL PI INDICATOR 7.5 (GLOVE) ×1
GLOVE BIOGEL PI INDICATOR 8.5 (GLOVE) ×1
GLOVE ECLIPSE 8.0 STRL XLNG CF (GLOVE) ×2 IMPLANT
GLOVE ORTHO TXT STRL SZ7.5 (GLOVE) ×2 IMPLANT
GOWN STRL REUS W/ TWL LRG LVL3 (GOWN DISPOSABLE) ×1 IMPLANT
GOWN STRL REUS W/TWL 2XL LVL3 (GOWN DISPOSABLE) ×2 IMPLANT
GOWN STRL REUS W/TWL LRG LVL3 (GOWN DISPOSABLE) ×4 IMPLANT
HANDPIECE INTERPULSE COAX TIP (DISPOSABLE) ×2
HOLDER FOLEY CATH W/STRAP (MISCELLANEOUS) IMPLANT
KIT TURNOVER KIT A (KITS) IMPLANT
MANIFOLD NEPTUNE II (INSTRUMENTS) ×2 IMPLANT
NDL SAFETY ECLIPSE 18X1.5 (NEEDLE) IMPLANT
NEEDLE HYPO 18GX1.5 SHARP (NEEDLE)
NS IRRIG 1000ML POUR BTL (IV SOLUTION) ×2 IMPLANT
PACK TOTAL KNEE CUSTOM (KITS) ×2 IMPLANT
PENCIL SMOKE EVACUATOR (MISCELLANEOUS) IMPLANT
PIN DRILL FIX HALF THREAD (BIT) ×2 IMPLANT
PIN FIX SIGMA LCS THRD HI (PIN) ×2 IMPLANT
PROTECTOR NERVE ULNAR (MISCELLANEOUS) ×2 IMPLANT
SET HNDPC FAN SPRY TIP SCT (DISPOSABLE) ×1 IMPLANT
SET PAD KNEE POSITIONER (MISCELLANEOUS) ×2 IMPLANT
SUT MNCRL AB 4-0 PS2 18 (SUTURE) ×2 IMPLANT
SUT STRATAFIX PDS+ 0 24IN (SUTURE) ×2 IMPLANT
SUT VIC AB 1 CT1 36 (SUTURE) ×2 IMPLANT
SUT VIC AB 2-0 CT1 27 (SUTURE) ×6
SUT VIC AB 2-0 CT1 TAPERPNT 27 (SUTURE) ×3 IMPLANT
SYR 3ML LL SCALE MARK (SYRINGE) ×2 IMPLANT
TIBIAL BASE ROT PLAT SZ 5 KNEE (Knees) ×2 IMPLANT
TRAY FOLEY MTR SLVR 16FR STAT (SET/KITS/TRAYS/PACK) ×2 IMPLANT
WATER STERILE IRR 1000ML POUR (IV SOLUTION) ×4 IMPLANT
WRAP KNEE MAXI GEL POST OP (GAUZE/BANDAGES/DRESSINGS) ×2 IMPLANT
YANKAUER SUCT BULB TIP 10FT TU (MISCELLANEOUS) ×2 IMPLANT

## 2019-08-11 NOTE — Plan of Care (Signed)
  Problem: Activity: Goal: Range of joint motion will improve Outcome: Progressing   Problem: Clinical Measurements: Goal: Postoperative complications will be avoided or minimized Outcome: Progressing   Problem: Pain Management: Goal: Pain level will decrease with appropriate interventions Outcome: Progressing   Problem: Skin Integrity: Goal: Will show signs of wound healing Outcome: Progressing

## 2019-08-11 NOTE — Discharge Instructions (Signed)

## 2019-08-11 NOTE — Op Note (Signed)
NAME:  Shawn [name]                      MEDICAL RECORD NO.:  [id number]                             FACILITY:  [facility]      PHYSICIAN:  Pietro Cassis. Alvan Dame, M.D.  DATE OF BIRTH:  1935-03-27      DATE OF PROCEDURE:  08/11/2019                                     OPERATIVE REPORT         PREOPERATIVE DIAGNOSIS:  Right knee osteoarthritis.      POSTOPERATIVE DIAGNOSIS:  Right knee osteoarthritis.      FINDINGS:  The patient was noted to have complete loss of cartilage and   bone-on-bone arthritis with associated osteophytes in the medial and patellofemoral compartments of   the knee with a significant synovitis and associated effusion.  The patient had failed months of conservative treatment including medications, injection therapy, activity modification.     PROCEDURE:  Right total knee replacement.      COMPONENTS USED:  DePuy Attune rotating platform posterior stabilized knee   system, a size 4 femur, 5 tibia, size 6 mm PS AOX insert, and 35 anatomic patellar   button.      SURGEON:  Pietro Cassis. Alvan Dame, M.D.      ASSISTANT:  Griffith Citron, PA-C.      ANESTHESIA:  Regional and Spinal.      SPECIMENS:  None.      COMPLICATION:  None.      DRAINS:  None.  EBL: <100cc       TOURNIQUET TIME:   Total Tourniquet Time Documented: Thigh (Right) - 28 minutes Total: Thigh (Right) - 28 minutes  .      The patient was stable to the recovery room.      INDICATION FOR PROCEDURE:  Shawn [name] is a 85 y.o. male patient of   mine.  The patient had been seen, evaluated, and treated for months conservatively in the   office with medication, activity modification, and injections.  The patient had   radiographic changes of bone-on-bone arthritis with endplate sclerosis and osteophytes noted.  Based on the radiographic changes and failed conservative measures, the patient   decided to proceed with definitive treatment, total knee replacement.  Risks of infection, DVT, component  failure, need for revision surgery, neurovascular injury were reviewed in the office setting.  The postop course was reviewed stressing the efforts to maximize post-operative satisfaction and function.  Consent was obtained for benefit of pain   relief.      PROCEDURE IN DETAIL:  The patient was brought to the operative theater.   Once adequate anesthesia, preoperative antibiotics, 2 gm of Ancef,1 gm of Tranexamic Acid, and 10 mg of Decadron administered, the patient was positioned supine with a right thigh tourniquet placed.  The  right lower extremity was prepped and draped in sterile fashion.  A time-   out was performed identifying the patient, planned procedure, and the appropriate extremity.      The right lower extremity was placed in the [facility] leg holder.  The leg was   exsanguinated, tourniquet elevated to 250 mmHg.  A midline incision was  made followed by median parapatellar arthrotomy.  Following initial   exposure, attention was first directed to the patella.  Precut   measurement was noted to be 22 mm.  I resected down to 13 mm and used a   35 anatomic patellar button to restore patellar height as well as cover the cut surface.      The lug holes were drilled and a metal shim was placed to protect the   patella from retractors and saw blade during the procedure.      At this point, attention was now directed to the femur.  The femoral   canal was opened with a drill, irrigated to try to prevent fat emboli.  An   intramedullary rod was passed at 3 degrees valgus, 9 mm of bone was   resected off the distal femur.  Following this resection, the tibia was   subluxated anteriorly.  Using the extramedullary guide, 2 mm of bone was resected off   the proximal medial tibia.  We confirmed the gap would be   stable medially and laterally with a size 5 spacer block as well as confirmed that the tibial cut was perpendicular in the coronal plane, checking with an alignment rod.      Once  this was done, I sized the femur to be a size 4 in the anterior-   posterior dimension, chose a standard component based on medial and   lateral dimension.  The size 4 rotation block was then pinned in   position anterior referenced using the C-clamp to set rotation.  The   anterior, posterior, and  chamfer cuts were made without difficulty nor   notching making certain that I was along the anterior cortex to help   with flexion gap stability.      The final box cut was made off the lateral aspect of distal femur.      At this point, the tibia was sized to be a size 5.  The size 5 tray was   then pinned in position through the medial third of the tubercle,   drilled, and keel punched.  Trial reduction was now carried with a 4 femur,  5 tibia, a size 6 mm PS insert, and the 35 anatomic patella botton.  The knee was brought to full extension with good flexion stability with the patella   tracking through the trochlea without application of pressure.  Given   all these findings the trial components removed.  Final components were   opened and cement was mixed.  The knee was irrigated with normal saline solution and pulse lavage.  The synovial lining was   then injected with 30 cc of 0.25% Marcaine with epinephrine, 1 cc of Toradol and 30 cc of NS for a total of 61 cc.     Final implants were then cemented onto cleaned and dried cut surfaces of bone with the knee brought to extension with a size 6 mm PS trial insert.      Once the cement had fully cured, excess cement was removed   throughout the knee.  I confirmed that I was satisfied with the range of   motion and stability, and the final size 6 mm PS AOX insert was chosen.  It was   placed into the knee.      The tourniquet had been let down at 28 minutes.  No significant   hemostasis was required.  The extensor mechanism was then reapproximated using #1 Vicryl  and #1 Stratafix sutures with the knee   in flexion.  The   remaining wound  was closed with 2-0 Vicryl and running 4-0 Monocryl.   The knee was cleaned, dried, dressed sterilely using Dermabond and   Aquacel dressing.  The patient was then   brought to recovery room in stable condition, tolerating the procedure   well.   Please note that Physician Assistant, Griffith Citron, PA-C was present for the entirety of the case, and was utilized for pre-operative positioning, peri-operative retractor management, general facilitation of the procedure and for primary wound closure at the end of the case.              Pietro Cassis Alvan Dame, M.D.    08/11/2019 11:41 AM

## 2019-08-11 NOTE — Anesthesia Procedure Notes (Signed)
Spinal  Patient location during procedure: OR Start time: 08/11/2019 10:25 AM End time: 08/11/2019 10:30 AM Staffing Performed: anesthesiologist  Anesthesiologist: Murvin Natal, MD Preanesthetic Checklist Completed: patient identified, IV checked, risks and benefits discussed, surgical consent, monitors and equipment checked, pre-op evaluation and timeout performed Spinal Block Patient position: sitting Prep: DuraPrep Patient monitoring: cardiac monitor, continuous pulse ox and blood pressure Approach: midline Location: L4-5 Injection technique: single-shot Needle Needle type: Pencan  Needle gauge: 24 G Needle length: 9 cm Assessment Sensory level: T10 Additional Notes Functioning IV was confirmed and monitors were applied. Sterile prep and drape, including hand hygiene and sterile gloves were used. The patient was positioned and the spine was prepped. The skin was anesthetized with lidocaine. Previous unsuccessful attempts by CRNA.  Free flow of clear CSF was obtained prior to injecting local anesthetic into the CSF.  The spinal needle aspirated freely following injection.  The needle was carefully withdrawn.  The patient tolerated the procedure well.

## 2019-08-11 NOTE — Anesthesia Preprocedure Evaluation (Addendum)
Anesthesia Evaluation  Patient identified by MRN, date of birth, ID band Patient awake    Reviewed: Allergy & Precautions, NPO status , Patient's Chart, lab work & pertinent test results  History of Anesthesia Complications (+) PONV and history of anesthetic complications  Airway Mallampati: III  TM Distance: >3 FB Neck ROM: Full    Dental no notable dental hx.    Pulmonary neg pulmonary ROS,    Pulmonary exam normal breath sounds clear to auscultation       Cardiovascular hypertension, Pt. on home beta blockers + CAD and + Cardiac Stents (x 4)  Normal cardiovascular exam+ dysrhythmias Atrial Fibrillation + pacemaker  Rhythm:Regular Rate:Normal     Neuro/Psych negative neurological ROS  negative psych ROS   GI/Hepatic Neg liver ROS, GERD  Medicated and Controlled,  Endo/Other  negative endocrine ROS  Renal/GU negative Renal ROS     Musculoskeletal  (+) Arthritis , Osteoarthritis,    Abdominal (+) + obese,   Peds  Hematology HLD   Anesthesia Other Findings Right knee osteoarthritis  Reproductive/Obstetrics                            Anesthesia Physical Anesthesia Plan  ASA: III  Anesthesia Plan: Spinal and Regional   Post-op Pain Management:    Induction: Intravenous  PONV Risk Score and Plan: 2 and Ondansetron, Dexamethasone, Propofol infusion and Treatment may vary due to age or medical condition  Airway Management Planned: Simple Face Mask  Additional Equipment:   Intra-op Plan:   Post-operative Plan:   Informed Consent: I have reviewed the patients History and Physical, chart, labs and discussed the procedure including the risks, benefits and alternatives for the proposed anesthesia with the patient or authorized representative who has indicated his/her understanding and acceptance.     Dental advisory given  Plan Discussed with: CRNA  Anesthesia Plan Comments:  (Pre-op anesthesia note reviewed )        Anesthesia Quick Evaluation

## 2019-08-11 NOTE — Anesthesia Procedure Notes (Signed)
Anesthesia Regional Block: Adductor canal block   Pre-Anesthetic Checklist: ,, timeout performed, Correct Patient, Correct Site, Correct Laterality, Correct Procedure,, site marked, risks and benefits discussed, Surgical consent,  Pre-op evaluation,  At surgeon's request and post-op pain management  Laterality: Right  Prep: chloraprep       Needles:  Injection technique: Single-shot  Needle Type: Echogenic Stimulator Needle     Needle Length: 9cm  Needle Gauge: 21     Additional Needles:   Procedures:,,,, ultrasound used (permanent image in chart),,,,  Narrative:  Start time: 08/11/2019 9:00 AM End time: 08/11/2019 9:10 AM Injection made incrementally with aspirations every 5 mL.  Performed by: Personally  Anesthesiologist: Murvin Natal, MD  Additional Notes: Functioning IV was confirmed and monitors were applied. A time-out was performed. Hand hygiene and sterile gloves were used. The thigh was placed in a frog-leg position and prepped in a sterile fashion. A 64mm 21ga Arrow echogenic stimulator needle was placed using ultrasound guidance.  Negative aspiration and negative test dose prior to incremental administration of local anesthetic. The patient tolerated the procedure well.

## 2019-08-11 NOTE — Progress Notes (Signed)
AssistedDr. Ellender with right, ultrasound guided, adductor canal block. Side rails up, monitors on throughout procedure. See vital signs in flow sheet. Tolerated Procedure well.  

## 2019-08-11 NOTE — Interval H&P Note (Signed)
History and Physical Interval Note:  08/11/2019 9:07 AM  Shawn [name]  has presented today for surgery, with the diagnosis of Right knee osteoarthritis.  The various methods of treatment have been discussed with the patient and family. After consideration of risks, benefits and other options for treatment, the patient has consented to  Procedure(s) with comments: TOTAL KNEE ARTHROPLASTY (Right) - 70 mins as a surgical intervention.  The patient's history has been reviewed, patient examined, no change in status, stable for surgery.  I have reviewed the patient's chart and labs.  Questions were answered to the patient's satisfaction.     Mauri Pole

## 2019-08-11 NOTE — Care Plan (Signed)
Ortho Bundle Case Management Note  Patient Details  Name: Shawn [name] MRN: [id number] Date of Birth: 1934-11-27  R TKA on 08-11-19 DCP:  Home with son and/or friend.  2 story home with 3 ste. DME:  No needs.  Has a RW and 3-in-1. PT:  [facility].  Eval scheduled on 08-14-19.                   DME Arranged:  N/A DME Agency:  NA  HH Arranged:  NA HH Agency:  NA  Additional Comments: Please contact me with any questions of if this plan should need to change.  Marianne Sofia, RN,CCM [facility]  [phone] 08/11/2019, 11:41 AM

## 2019-08-11 NOTE — Anesthesia Postprocedure Evaluation (Signed)
Anesthesia Post Note  Patient: Shawn [name]  Procedure(s) Performed: TOTAL KNEE ARTHROPLASTY (Right Knee)     Patient location during evaluation: PACU Anesthesia Type: Regional and Spinal Level of consciousness: oriented and awake Pain management: pain level controlled Vital Signs Assessment: post-procedure vital signs reviewed and stable Respiratory status: spontaneous breathing, respiratory function stable and patient connected to nasal cannula oxygen Cardiovascular status: blood pressure returned to baseline and stable Postop Assessment: no headache, no backache and no apparent nausea or vomiting Anesthetic complications: no    Last Vitals:  Vitals:   08/11/19 1423 08/11/19 1525  BP: 140/75 126/61  Pulse: 65 73  Resp: 16 17  Temp: 36.5 C 37.1 C  SpO2: 100% 96%    Last Pain:  Vitals:   08/11/19 1525  TempSrc: Oral  PainSc:                  Shawn [name]

## 2019-08-11 NOTE — Evaluation (Signed)
Physical Therapy Evaluation Patient Details Name: Shawn [name] MRN: [id number] DOB: [birth year] Today's Date: 08/11/2019   History of Present Illness  RTKA, HO CAD, Afib, PPM  Clinical Impression  The patient is experiencing remaining numbness and muscle weakness. Able to stand and take a few steps to recliner with right knee supported. Patient should progress well tomorrow to DC to home. Pt admitted with above diagnosis. Pt currently with functional limitations due to the deficits listed below (see PT Problem List). Pt will benefit from skilled PT to increase their independence and safety with mobility to allow discharge to the venue listed below.       Follow Up Recommendations Follow surgeon's recommendation for DC plan and follow-up therapies    Equipment Recommendations  None recommended by PT    Recommendations for Other Services       Precautions / Restrictions Precautions Precautions: Fall Precaution Comments: still numbness, PPM      Mobility  Bed Mobility Overal bed mobility: Needs Assistance Bed Mobility: Supine to Sit     Supine to sit: Min guard     General bed mobility comments: no assistance required  Transfers Overall transfer level: Needs assistance Equipment used: Rolling walker (2 wheeled) Transfers: Sit to/from [facility] Sit to Stand: Mod assist Stand pivot transfers: Mod assist       General transfer comment: right > left leg somewhat lack of control. sat and  stood again and took small pivot steps to recliner, noted decreased right leg control.  Ambulation/Gait             General Gait Details: tba  Stairs            Wheelchair Mobility    Modified Rankin (Stroke Patients Only)       Balance Overall balance assessment: Needs assistance Sitting-balance support: No upper extremity supported;Single extremity supported Sitting balance-Leahy Scale: Fair     Standing balance support: Bilateral upper  extremity supported;During functional activity Standing balance-Leahy Scale: Poor Standing balance comment: due to numbness most likely                             Pertinent Vitals/Pain Pain Assessment: 0-10 Pain Score: 4  Pain Location: right knee Pain Descriptors / Indicators: Discomfort Pain Intervention(s): Monitored during session;Ice applied    Home Living Family/patient expects to be discharged to:: Private residence Living Arrangements: Non-relatives/Friends;Children Available Help at Discharge: Family;Available 24 hours/day Type of Home: House Home Access: Stairs to enter Entrance Stairs-Rails: Left Entrance Stairs-Number of Steps: 3 Home Layout: Two level;Able to live on main level with bedroom/bathroom Home Equipment: Gilford Rile - 2 wheels;Cane - single point      Prior Function Level of Independence: Independent               Hand Dominance        Extremity/Trunk Assessment   Upper Extremity Assessment Upper Extremity Assessment: Overall WFL for tasks assessed    Lower Extremity Assessment Lower Extremity Assessment: RLE deficits/detail RLE Deficits / Details: able to SLR, knee flex10-80, reportsmfoot and lower leg till feel numb    Cervical / Trunk Assessment Cervical / Trunk Assessment: Normal  Communication   Communication: No difficulties  Cognition Arousal/Alertness: Awake/alert Behavior During Therapy: WFL for tasks assessed/performed Overall Cognitive Status: Within Functional Limits for tasks assessed  General Comments      Exercises Total Joint Exercises Ankle Circles/Pumps: AROM;Both;10 reps;Supine Quad Sets: AROM;Both;5 reps;Supine Straight Leg Raises: AROM;Both;5 reps;Supine Knee Flexion: AROM;5 reps;Seated   Assessment/Plan    PT Assessment Patient needs continued PT services  PT Problem List Decreased strength;Decreased range of motion;Decreased  mobility;Decreased knowledge of use of DME;Decreased activity tolerance;Decreased knowledge of precautions;Impaired sensation       PT Treatment Interventions DME instruction;Functional mobility training;Patient/family education;Gait training;Therapeutic activities;Stair training;Therapeutic exercise    PT Goals (Current goals can be found in the Care Plan section)  Acute Rehab PT Goals Patient Stated Goal: to go home PT Goal Formulation: With patient/family Time For Goal Achievement: 08/18/19 Potential to Achieve Goals: Good    Frequency 7X/week   Barriers to discharge        Co-evaluation               AM-PAC PT "6 Clicks" Mobility  Outcome Measure Help needed turning from your back to your side while in a flat bed without using bedrails?: A Little Help needed moving from lying on your back to sitting on the side of a flat bed without using bedrails?: A Little Help needed moving to and from a bed to a chair (including a wheelchair)?: A Lot Help needed standing up from a chair using your arms (e.g., wheelchair or bedside chair)?: A Lot Help needed to walk in hospital room?: A Lot Help needed climbing 3-5 steps with a railing? : Total 6 Click Score: 13    End of Session Equipment Utilized During Treatment: Gait belt Activity Tolerance: Patient tolerated treatment well Patient left: in chair;with call bell/phone within reach;with chair alarm set Nurse Communication: Mobility status PT Visit Diagnosis: Unsteadiness on feet (R26.81);Muscle weakness (generalized) (M62.81);Difficulty in walking, not elsewhere classified (R26.2)    Time: [id number] PT Time Calculation (min) (ACUTE ONLY): 30 min   Charges:   PT Evaluation $PT Eval Low Complexity: 1 Low PT Treatments $Therapeutic Activity: 8-22 mins        Tresa Endo PT Acute Rehabilitation Services Pager [phone] Office [phone]   Claretha Cooper 08/11/2019, 4:41 PM

## 2019-08-11 NOTE — Progress Notes (Addendum)
ANTICOAGULATION CONSULT NOTE - Initial Consult  Pharmacy Consult for Warfarin Indication: atrial fibrillation  No Known Allergies  Patient Measurements: Height: 5\' 7"  (170.2 cm) Weight: 200 lb 9 oz (91 kg) IBW/kg (Calculated) : 66.1  Vital Signs: Temp: 97.8 F (36.6 C) (03/09 1300) Temp Source: Oral (03/09 0900) BP: 131/70 (03/09 1300) Pulse Rate: 59 (03/09 1300)  Labs: No results for input(s): HGB, HCT, PLT, APTT, LABPROT, INR, HEPARINUNFRC, HEPRLOWMOCWT, CREATININE, CKTOTAL, CKMB, TROPONINIHS in the last 72 hours.  Estimated Creatinine Clearance: 59.5 mL/min (by C-G formula based on SCr of 1.03 mg/dL).   Medical History: Past Medical History:  Diagnosis Date  . Actinic keratosis   . Adenomatous colon polyp   . ALLERGIC RHINITIS   . Arthritis   . CAD, NATIVE VESSEL    a. DES x2 to RCA 2004 b. repeat PTCA to RCA 2008 c. PCI to RCA 2012 non-DES   . Cellulitis and abscess of trunk   . Diverticulosis   . Dysrhythmia   . GERD (gastroesophageal reflux disease)   . HYPERLIPIDEMIA   . HYPERTENSION   . OTH MALIG NEOPLASM SKIN OTH\T\UNSPEC PARTS FACE   . Permanent atrial fibrillation ([place])   . PONV (postoperative nausea and vomiting)   . Presence of permanent cardiac pacemaker   . Skin cancer   . Tachy-brady syndrome ([place])    a. s/p MDT dual chamber pacemaker implant complicated by pericardial effusion and micro-perforatation s/p lead revisions    Medications:  Medications Prior to Admission  Medication Sig Dispense Refill Last Dose  . acetaminophen (TYLENOL) 325 MG tablet Take 650 mg by mouth every 6 (six) hours as needed for moderate pain.   Past Week at Unknown time  . atorvastatin (LIPITOR) 40 MG tablet Take 1 tablet (40 mg total) by mouth daily at 6 PM. 90 tablet 0 08/11/2019 at 0615  . diltiazem (CARDIZEM CD) 240 MG 24 hr capsule TAKE 1 CAPSULE EVERY DAY (Patient taking differently: Take 240 mg by mouth daily. ) 90 capsule 0 08/10/2019  . finasteride (PROSCAR) 5 MG  tablet Take 5 mg by mouth daily.    08/11/2019 at [facility]  . fluticasone (FLONASE) 50 MCG/ACT nasal spray USE 2 SPRAYS IN EACH NOSTRIL EVERY DAY (Patient taking differently: Place 2 sprays into both nostrils daily. ) 48 g 0 08/10/2019 at Unknown time  . metoprolol succinate (TOPROL-XL) 50 MG 24 hr tablet Take with or immediately following a meal. (Patient taking differently: Take 75 mg by mouth daily. Take with or immediately following a meal.) 135 tablet 0 08/11/2019 at 0615  . Multiple Vitamins-Minerals (MULTIVITAMIN & MINERAL PO) Take 1 tablet by mouth every other day.    Past Week at Unknown time  . nitroGLYCERIN (NITROSTAT) 0.4 MG SL tablet Place 1 tablet (0.4 mg total) under the tongue every 5 (five) minutes as needed for chest pain. 25 tablet 1   . omeprazole (PRILOSEC) 20 MG capsule Take 1 capsule (20 mg total) by mouth daily. 90 capsule 3 Past Week at Unknown time  . tamsulosin (FLOMAX) 0.4 MG CAPS capsule TAKE 2 CAPSULES EVERY DAY (Patient taking differently: Take 0.8 mg by mouth daily. ) 180 capsule 0 08/10/2019 at Unknown time  . warfarin (COUMADIN) 2.5 MG tablet TAKE 1 TABLET DAILY EXCEPT TAKE 2 TABLETS ON Mondays or AS DIRECTED BY THE COUMADIN CLINIC (Patient taking differently: Take 2.5-5 mg by mouth See admin instructions. Take 5 mg by mouth on Monday and take 2.5 mg Tuesday - Sunday) 120 tablet 0  08/04/2019    Assessment: 85 y/o M on chronic warfarin for atrial fibrillation. Patient is now s/p rt TKA with plans to resume warfarin this evening.   Last warfarin dose 3/2 INR 1.2 on 3/5  Goal of Therapy:  INR 2-3   Plan:  Warfarin 5 mg tonight F/U AM INR  Ulice Dash D 08/11/2019,1:09 PM

## 2019-08-11 NOTE — Transfer of Care (Signed)
Immediate Anesthesia Transfer of Care Note  Patient: Shawn [name]  Procedure(s) Performed: TOTAL KNEE ARTHROPLASTY (Right Knee)  Patient Location: PACU  Anesthesia Type:Spinal  Level of Consciousness: awake, alert  and oriented  Airway & Oxygen Therapy: Patient Spontanous Breathing and Patient connected to face mask oxygen  Post-op Assessment: Report given to RN and Post -op Vital signs reviewed and stable  Post vital signs: Reviewed and stable  Last Vitals:  Vitals Value Taken Time  BP 116/61 08/11/19 1207  Temp    Pulse 64 08/11/19 1208  Resp 19 08/11/19 1208  SpO2 100 % 08/11/19 1208  Vitals shown include unvalidated device data.  Last Pain:  Vitals:   08/11/19 0925  TempSrc:   PainSc: 0-No pain         Complications: No apparent anesthesia complications

## 2019-08-12 ENCOUNTER — Other Ambulatory Visit: Payer: Self-pay | Admitting: Family Medicine

## 2019-08-12 ENCOUNTER — Encounter: Payer: Self-pay | Admitting: *Deleted

## 2019-08-12 DIAGNOSIS — M1711 Unilateral primary osteoarthritis, right knee: Secondary | ICD-10-CM | POA: Diagnosis not present

## 2019-08-12 DIAGNOSIS — E669 Obesity, unspecified: Secondary | ICD-10-CM | POA: Diagnosis present

## 2019-08-12 DIAGNOSIS — M659 Synovitis and tenosynovitis, unspecified: Secondary | ICD-10-CM | POA: Diagnosis not present

## 2019-08-12 DIAGNOSIS — I10 Essential (primary) hypertension: Secondary | ICD-10-CM | POA: Diagnosis not present

## 2019-08-12 DIAGNOSIS — E785 Hyperlipidemia, unspecified: Secondary | ICD-10-CM

## 2019-08-12 DIAGNOSIS — Z7901 Long term (current) use of anticoagulants: Secondary | ICD-10-CM

## 2019-08-12 DIAGNOSIS — Z85828 Personal history of other malignant neoplasm of skin: Secondary | ICD-10-CM | POA: Diagnosis not present

## 2019-08-12 DIAGNOSIS — Z955 Presence of coronary angioplasty implant and graft: Secondary | ICD-10-CM | POA: Diagnosis not present

## 2019-08-12 DIAGNOSIS — Z950 Presence of cardiac pacemaker: Secondary | ICD-10-CM | POA: Diagnosis not present

## 2019-08-12 DIAGNOSIS — I2510 Atherosclerotic heart disease of native coronary artery without angina pectoris: Secondary | ICD-10-CM | POA: Diagnosis not present

## 2019-08-12 DIAGNOSIS — M25461 Effusion, right knee: Secondary | ICD-10-CM | POA: Diagnosis not present

## 2019-08-12 LAB — BASIC METABOLIC PANEL
Anion gap: 9 (ref 5–15)
BUN: 17 mg/dL (ref 8–23)
CO2: 22 mmol/L (ref 22–32)
Calcium: 8.5 mg/dL — ABNORMAL LOW (ref 8.9–10.3)
Chloride: 107 mmol/L (ref 98–111)
Creatinine, Ser: 0.98 mg/dL (ref 0.61–1.24)
GFR calc Af Amer: >60 mL/min (ref >60)
GFR calc non Af Amer: >60 mL/min (ref >60)
Glucose, Bld: 208 mg/dL — ABNORMAL HIGH (ref 70–99)
Potassium: 4.6 mmol/L (ref 3.5–5.1)
Sodium: 138 mmol/L (ref 135–145)

## 2019-08-12 LAB — CBC
HCT: 40 % (ref 39.0–52.0)
Hemoglobin: 13.2 g/dL (ref 13.0–17.0)
MCH: 31.7 pg (ref 26.0–34.0)
MCHC: 33 g/dL (ref 30.0–36.0)
MCV: 96.2 fL (ref 80.0–100.0)
Platelets: 152 10*3/uL (ref 150–400)
RBC: 4.16 MIL/uL — ABNORMAL LOW (ref 4.22–5.81)
RDW: 13.2 % (ref 11.5–15.5)
WBC: 13.1 10*3/uL — ABNORMAL HIGH (ref 4.0–10.5)
nRBC: 0 % (ref 0.0–0.2)

## 2019-08-12 LAB — PROTIME-INR
INR: 1 (ref 0.8–1.2)
Prothrombin Time: 13.1 seconds (ref 11.4–15.2)

## 2019-08-12 MED ORDER — POLYETHYLENE GLYCOL 3350 17 G PO PACK: 17.0000 g | PACK | Freq: Two times a day (BID) | ORAL | 0 refills | Status: DC

## 2019-08-12 MED ORDER — DOCUSATE SODIUM 100 MG PO CAPS: 100.0000 mg | ORAL_CAPSULE | Freq: Two times a day (BID) | ORAL | 0 refills | Status: DC

## 2019-08-12 MED ORDER — ENOXAPARIN SODIUM 40 MG/0.4ML ~~LOC~~ SOLN: 40.0000 mg | SUBCUTANEOUS | 0 refills | Status: DC

## 2019-08-12 MED ORDER — FERROUS SULFATE 325 (65 FE) MG PO TABS: 325.0000 mg | ORAL_TABLET | Freq: Three times a day (TID) | ORAL | 0 refills | Status: DC

## 2019-08-12 MED ORDER — HYDROCODONE-ACETAMINOPHEN 7.5-325 MG PO TABS: 1.0000 | ORAL_TABLET | ORAL | 0 refills | Status: DC | PRN

## 2019-08-12 MED ORDER — METHOCARBAMOL 500 MG PO TABS: 500.0000 mg | ORAL_TABLET | Freq: Four times a day (QID) | ORAL | 0 refills | Status: DC | PRN

## 2019-08-12 MED ORDER — WARFARIN SODIUM 5 MG PO TABS
5.0000 mg | ORAL_TABLET | Freq: Once | ORAL | Status: AC
  Administered 2019-08-12: 12:00:00 5 mg via ORAL
  Filled 2019-08-12: qty 1

## 2019-08-12 NOTE — Progress Notes (Signed)
Physical Therapy Treatment Patient Details Name: Shawn [name] MRN: [id number] DOB: 23-Dec-1934 Today's Date: 08/12/2019    History of Present Illness RTKA, HO CAD, Afib, PPM    PT Comments    POD # 1 am session Pt OOB in recliner.  Son present.  Assisted with amb and practice stairs.  General transfer comment: 50% VC's on safety.  Pt impulsive.  VC's to use walker throughout turning as he attempted to push to thew side and step without.  VC's on proper hand placenment with stand to sit as pt attempted to sit uncontrolled.General Gait Details: VC's on proper walker to self distance and esp with turns.General stair comments: 25% VC's on kproper sequencing and safety.  Pt did well using B hands on one rail.  Son present during for family education on safe handling.Then returned to room to perform some TE's following HEP handout.  Instructed on proper tech, freq as well as use of ICE.   Addressed all mobility questions, discussed appropriate activity, educated on use of ICE.  Pt ready for D/C to home.   Follow Up Recommendations  Follow surgeon's recommendation for DC plan and follow-up therapies     Equipment Recommendations  None recommended by PT    Recommendations for Other Services       Precautions / Restrictions Precautions Precautions: Fall Precaution Comments: instructed no pillow under knee Restrictions Weight Bearing Restrictions: No Other Position/Activity Restrictions: WBAT    Mobility  Bed Mobility               General bed mobility comments: OOB in recliner  Transfers Overall transfer level: Needs assistance Equipment used: Rolling walker (2 wheeled) Transfers: Sit to/from [facility] Sit to Stand: Supervision Stand pivot transfers: Supervision;Min guard       General transfer comment: 50% VC's on safety.  Pt impulsive.  VC's to use walker throughout turning as he attempted to push to thew side and step without.  VC's on proper hand  placenment with stand to sit as pt attempted to sit uncontrolled.  Ambulation/Gait Ambulation/Gait assistance: Supervision Gait Distance (Feet): 45 Feet Assistive device: Rolling walker (2 wheeled) Gait Pattern/deviations: Step-through pattern Gait velocity: decreased   General Gait Details: VC's on proper walker to self distance and esp with turns.   Stairs Stairs: Yes Stairs assistance: Supervision;Min guard Stair Management: One rail Left;Sideways;Forwards Number of Stairs: 2 General stair comments: 25% VC's on kproper sequencing and safety.  Pt did well using B hands on one rail.  Son present during for family education on safe handling.   Wheelchair Mobility    Modified Rankin (Stroke Patients Only)       Balance                                            Cognition Arousal/Alertness: Awake/alert Behavior During Therapy: WFL for tasks assessed/performed Overall Cognitive Status: Within Functional Limits for tasks assessed                                 General Comments: impulsive      Exercises      General Comments        Pertinent Vitals/Pain Pain Assessment: Faces Faces Pain Scale: Hurts a little bit Pain Location: right knee/right thigh Pain Descriptors / Indicators: Discomfort;Sore Pain Intervention(s):  Monitored during session;Premedicated before session;Repositioned;Ice applied    Home Living                      Prior Function            PT Goals (current goals can now be found in the care plan section) Progress towards PT goals: Progressing toward goals    Frequency    7X/week      PT Plan      Co-evaluation              AM-PAC PT "6 Clicks" Mobility   Outcome Measure  Help needed turning from your back to your side while in a flat bed without using bedrails?: None Help needed moving from lying on your back to sitting on the side of a flat bed without using bedrails?: None Help  needed moving to and from a bed to a chair (including a wheelchair)?: None Help needed standing up from a chair using your arms (e.g., wheelchair or bedside chair)?: None Help needed to walk in hospital room?: None Help needed climbing 3-5 steps with a railing? : A Little 6 Click Score: 23    End of Session Equipment Utilized During Treatment: Gait belt Activity Tolerance: Patient tolerated treatment well Patient left: in chair;with call bell/phone within reach;with chair alarm set Nurse Communication: Mobility status(pt ready for D/C to home one PT session) PT Visit Diagnosis: Unsteadiness on feet (R26.81);Muscle weakness (generalized) (M62.81);Difficulty in walking, not elsewhere classified (R26.2)     Time: [id number] PT Time Calculation (min) (ACUTE ONLY): 41 min  Charges:  $Gait Training: 8-22 mins $Therapeutic Exercise: 8-22 mins $Therapeutic Activity: 8-22 mins                     {Christle Nolting  PTA Acute  Rehabilitation Services Pager      [phone] Office      [phone]

## 2019-08-12 NOTE — Progress Notes (Addendum)
ANTICOAGULATION CONSULT NOTE - Follow Up Consult  Pharmacy Consult for Warfarin Indication: atrial fibrillation  No Known Allergies  Patient Measurements: Height: 5\' 7"  (170.2 cm) Weight: 200 lb 9 oz (91 kg) IBW/kg (Calculated) : 66.1  Vital Signs: Temp: 98.2 F (36.8 C) (03/10 1013) Temp Source: Oral (03/10 1013) BP: 131/65 (03/10 1013) Pulse Rate: 71 (03/10 1013)  Labs: Recent Labs    08/11/19 1352 08/12/19 0326  HGB 14.3 13.2  HCT 43.9 40.0  PLT 144* 152  LABPROT  --  13.1  INR  --  1.0  CREATININE 0.96 0.98    Estimated Creatinine Clearance: 62.6 mL/min (by C-G formula based on SCr of 0.98 mg/dL).   Medications:  Scheduled:  . atorvastatin  40 mg Oral q1800  . diltiazem  240 mg Oral Daily  . docusate sodium  100 mg Oral BID  . enoxaparin (LOVENOX) injection  40 mg Subcutaneous Q24H  . ferrous sulfate  325 mg Oral BID WC  . finasteride  5 mg Oral Daily  . fluticasone  2 spray Each Nare Daily  . metoprolol succinate  75 mg Oral Q breakfast  . omeprazole  20 mg Oral Daily  . polyethylene glycol  17 g Oral BID  . tamsulosin  0.8 mg Oral Daily  . warfarin  5 mg Oral ONCE-1800  . Warfarin - Pharmacist Dosing Inpatient   Does not apply q1800    Assessment: 85 y/o M on chronic warfarin for atrial fibrillation. Patient is now s/p rt TKA. Warfarin resumed 3/9.   Last warfarin dose 3/2 INR 1.2 on 3/5  08/12/19 - INR 1 - no bleeding reported - warfarin not administered last PM (not charted on). Queried Therapist, sports who does not know why it was held  Goal of Therapy:  INR 2-3   Plan:  - Warfarin 5 mg today - Continue Lovenox VTE ppx dosing - Daily INR   Ulice Dash D 08/12/2019,10:34 AM

## 2019-08-12 NOTE — Progress Notes (Signed)
     Subjective: 1 Day Post-Op Procedure(s) (LRB): TOTAL KNEE ARTHROPLASTY (Right)   Patient reports pain as mild, pain controlled medication.  No reported events throughout the night.  Dr. Alvan Dame discussed the procedure, findings and expectations moving forward.  Patient is ready be discharged home, if he does well therapy.     Objective:   VITALS:   Vitals:   08/12/19 0232 08/12/19 0516  BP: 131/86 119/74  Pulse: 78 81  Resp: 18 18  Temp: 97.9 F (36.6 C) 97.8 F (36.6 C)  SpO2: 97% 98%    Dorsiflexion/Plantar flexion intact Incision: dressing C/D/I No cellulitis present Compartment soft  LABS Recent Labs    08/11/19 1352 08/12/19 0326  HGB 14.3 13.2  HCT 43.9 40.0  WBC 9.3 13.1*  PLT 144* 152    Recent Labs    08/11/19 1352 08/12/19 0326  NA  --  138  K  --  4.6  BUN  --  17  CREATININE 0.96 0.98  GLUCOSE  --  208*     Assessment/Plan: 1 Day Post-Op Procedure(s) (LRB): TOTAL KNEE ARTHROPLASTY (Right) Foley cath d/c'ed Advance diet Up with therapy D/C IV fluids Discharge home Follow up in 2 weeks at [facility] Follow up with OLIN,Lorah Kalina D in 2 weeks.  Contact information:  [facility] [address] [phone]      Obese (BMI 30-39.9) Estimated body mass index is 31.41 kg/m as calculated from the following:   Height as of this encounter: 5\' 7"  (1.702 m).   Weight as of this encounter: 91 kg. Patient also counseled that weight may inhibit the healing process Patient counseled that losing weight will help with future health issues         Danae Orleans PA-C  [facility]  [place] [address], [place], [place] [address] Phone: [phone] [email or web address]  [employer]

## 2019-08-14 DIAGNOSIS — Z4789 Encounter for other orthopedic aftercare: Secondary | ICD-10-CM | POA: Diagnosis not present

## 2019-08-14 DIAGNOSIS — R262 Difficulty in walking, not elsewhere classified: Secondary | ICD-10-CM | POA: Diagnosis not present

## 2019-08-14 DIAGNOSIS — Z96651 Presence of right artificial knee joint: Secondary | ICD-10-CM | POA: Diagnosis not present

## 2019-08-19 NOTE — Discharge Summary (Signed)
Physician Discharge Summary  Patient ID: Shawn [name] MRN: [id number] DOB/AGE: 1934-07-16 85 y.o.  Admit date: 08/11/2019 Discharge date: 08/12/2019   Procedures:  Procedure(s) (LRB): TOTAL KNEE ARTHROPLASTY (Right)  Attending Physician:  Dr. Paralee Cancel   Admission Diagnoses:   Right knee primary OA / pain  Discharge Diagnoses:  Active Problems:   Status post total right knee replacement   Obese  Past Medical History:  Diagnosis Date  . Actinic keratosis   . Adenomatous colon polyp   . ALLERGIC RHINITIS   . Arthritis   . CAD, NATIVE VESSEL    a. DES x2 to RCA 2004 b. repeat PTCA to RCA 2008 c. PCI to RCA 2012 non-DES   . Cellulitis and abscess of trunk   . Diverticulosis   . Dysrhythmia   . GERD (gastroesophageal reflux disease)   . HYPERLIPIDEMIA   . HYPERTENSION   . OTH MALIG NEOPLASM SKIN OTH\T\UNSPEC PARTS FACE   . Permanent atrial fibrillation ([place])   . PONV (postoperative nausea and vomiting)   . Presence of permanent cardiac pacemaker   . Skin cancer   . Tachy-brady syndrome ([place])    a. s/p MDT dual chamber pacemaker implant complicated by pericardial effusion and micro-perforatation s/p lead revisions    HPI:    Shawn [name], 85 y.o. male, has a history of pain and functional disability in the right knee due to arthritis and has failed non-surgical conservative treatments for greater than 12 weeks to include analgesics, corticosteriod injections and activity modification.  Onset of symptoms was gradual, starting 1+ years ago with gradually worsening course since that time. The patient noted no past surgery on the right knee(s).  Patient currently rates pain in the right knee(s) at 6 out of 10 with activity. Patient has night pain, worsening of pain with activity and weight bearing, pain that interferes with activities of daily living and pain with passive range of motion.  Patient has evidence of periarticular osteophytes and joint space narrowing by  imaging studies.  There is no active infection.  Risks, benefits and expectations were discussed with the patient.  Risks including but not limited to the risk of anesthesia, blood clots, nerve damage, blood vessel damage, failure of the prosthesis, infection and up to and including death.  Patient understand the risks, benefits and expectations and wishes to proceed with surgery.   PCP: Shawn Olp, MD   Discharged Condition: good  Hospital Course:  Patient underwent the above stated procedure on 08/11/2019. Patient tolerated the procedure well and brought to the recovery room in good condition and subsequently to the floor.  POD #1 BP: 119/74 ; Pulse: 81 ; Temp: 97.8 F (36.6 C) ; Resp: 18 Patient reports pain as mild, pain controlled medication.  No reported events throughout the night.  Dr. Alvan Dame discussed the procedure, findings and expectations moving forward.  Patient is ready be discharged home. Dorsiflexion/plantar flexion intact, incision: dressing C/D/I, no cellulitis present and compartment soft.   LABS  Basename    HGB     13.2  HCT     40.0    Discharge Exam: General appearance: alert, cooperative and no distress Extremities: Homans sign is negative, no sign of DVT, no edema, redness or tenderness in the calves or thighs and no ulcers, gangrene or trophic changes  Disposition: Home with follow up in 2 weeks   [place] on 08/14/2019.   Specialty: Physical Therapy  Why: You are scheduled for physical therapy on 08-14-19 at 10:00 am.  Contact information: [facility] [address] [place] [address] [phone]        Danae Orleans, [place]. Go on 08/26/2019.   Specialty: Orthopedic Surgery Why: You are scheduled for a post-operative appointment on 08-26-19 at 2:15 pm.  Contact information: [address] [place] [address] [id number]           Discharge  Instructions    Call MD / Call 911   Complete by: As directed    If you experience chest pain or shortness of breath, CALL 911 and be transported to the hospital emergency room.  If you develope a fever above 101 F, pus (white drainage) or increased drainage or redness at the wound, or calf pain, call your surgeon's office.   Change dressing   Complete by: As directed    Maintain surgical dressing until follow up in the clinic. If the edges start to pull up, may reinforce with tape. If the dressing is no longer working, may remove and cover with gauze and tape, but must keep the area dry and clean.  Call with any questions or concerns.   Constipation Prevention   Complete by: As directed    Drink plenty of fluids.  Prune juice may be helpful.  You may use a stool softener, such as Colace (over the counter) 100 mg twice a day.  Use MiraLax (over the counter) for constipation as needed.   Diet - low sodium heart healthy   Complete by: As directed    Discharge instructions   Complete by: As directed    Maintain surgical dressing until follow up in the clinic. If the edges start to pull up, may reinforce with tape. If the dressing is no longer working, may remove and cover with gauze and tape, but must keep the area dry and clean.  Follow up in 2 weeks at [facility]. Call with any questions or concerns.   Increase activity slowly as tolerated   Complete by: As directed    Weight bearing as tolerated with assist device (walker, cane, etc) as directed, use it as long as suggested by your surgeon or therapist, typically at least 4-6 weeks.   TED hose   Complete by: As directed    Use stockings (TED hose) for 2 weeks on both leg(s).  You may remove them at night for sleeping.      Allergies as of 08/12/2019   No Known Allergies     Medication List    STOP taking these medications   acetaminophen 325 MG tablet Commonly known as: TYLENOL     TAKE these medications   diltiazem 240 MG 24 hr  capsule Commonly known as: CARDIZEM CD TAKE 1 CAPSULE EVERY DAY   docusate sodium 100 MG capsule Commonly known as: Colace Take 1 capsule (100 mg total) by mouth 2 (two) times daily.   ferrous sulfate 325 (65 FE) MG tablet Commonly known as: FerrouSul Take 1 tablet (325 mg total) by mouth 3 (three) times daily with meals for 14 days.   finasteride 5 MG tablet Commonly known as: PROSCAR Take 5 mg by mouth daily.   fluticasone 50 MCG/ACT nasal spray Commonly known as: FLONASE USE 2 SPRAYS IN EACH NOSTRIL EVERY DAY   HYDROcodone-acetaminophen 7.5-325 MG tablet Commonly known as: Norco Take 1-2 tablets by mouth every 4 (four) hours as needed for moderate pain.  methocarbamol 500 MG tablet Commonly known as: Robaxin Take 1 tablet (500 mg total) by mouth every 6 (six) hours as needed for muscle spasms.   metoprolol succinate 50 MG 24 hr tablet Commonly known as: TOPROL-XL Take with or immediately following a meal. What changed:   how much to take  how to take this  when to take this   MULTIVITAMIN & MINERAL PO Take 1 tablet by mouth every other day.   nitroGLYCERIN 0.4 MG SL tablet Commonly known as: NITROSTAT Place 1 tablet (0.4 mg total) under the tongue every 5 (five) minutes as needed for chest pain.   omeprazole 20 MG capsule Commonly known as: PRILOSEC Take 1 capsule (20 mg total) by mouth daily.   polyethylene glycol 17 g packet Commonly known as: MIRALAX / GLYCOLAX Take 17 g by mouth 2 (two) times daily.   tamsulosin 0.4 MG Caps capsule Commonly known as: FLOMAX TAKE 2 CAPSULES EVERY DAY            Discharge Care Instructions  (From admission, onward)         Start     Ordered   08/12/19 0000  Change dressing    Comments: Maintain surgical dressing until follow up in the clinic. If the edges start to pull up, may reinforce with tape. If the dressing is no longer working, may remove and cover with gauze and tape, but must keep the area dry and  clean.  Call with any questions or concerns.   08/12/19 [id number]           Signed: [place]. Aasiya Creasey   PA-C  08/19/2019, 2:04 PM

## 2019-08-20 DIAGNOSIS — R262 Difficulty in walking, not elsewhere classified: Secondary | ICD-10-CM | POA: Diagnosis not present

## 2019-08-20 DIAGNOSIS — Z4789 Encounter for other orthopedic aftercare: Secondary | ICD-10-CM | POA: Diagnosis not present

## 2019-08-20 DIAGNOSIS — Z96651 Presence of right artificial knee joint: Secondary | ICD-10-CM | POA: Diagnosis not present

## 2019-08-25 DIAGNOSIS — Z4789 Encounter for other orthopedic aftercare: Secondary | ICD-10-CM | POA: Diagnosis not present

## 2019-08-25 DIAGNOSIS — Z96651 Presence of right artificial knee joint: Secondary | ICD-10-CM | POA: Diagnosis not present

## 2019-08-25 DIAGNOSIS — R262 Difficulty in walking, not elsewhere classified: Secondary | ICD-10-CM | POA: Diagnosis not present

## 2019-08-26 ENCOUNTER — Ambulatory Visit (INDEPENDENT_AMBULATORY_CARE_PROVIDER_SITE_OTHER): Payer: PRIVATE HEALTH INSURANCE | Admitting: General Practice

## 2019-08-26 ENCOUNTER — Other Ambulatory Visit: Payer: Self-pay

## 2019-08-26 DIAGNOSIS — Z7901 Long term (current) use of anticoagulants: Secondary | ICD-10-CM | POA: Diagnosis not present

## 2019-08-26 DIAGNOSIS — I4891 Unspecified atrial fibrillation: Secondary | ICD-10-CM

## 2019-08-26 LAB — POCT INR: INR: 2.7 (ref 2.0–3.0)

## 2019-08-26 NOTE — Patient Instructions (Addendum)
Pre visit review using our clinic review tool, if applicable. No additional management support is needed unless otherwise documented below in the visit note.  Take 1 tablet daily except 2 tablets on Mondays only. Re-check in 6 weeks.  

## 2019-08-26 NOTE — Progress Notes (Signed)
I have reviewed and agree with note, evaluation, plan.   Levon Penning, MD  

## 2019-09-01 DIAGNOSIS — R262 Difficulty in walking, not elsewhere classified: Secondary | ICD-10-CM | POA: Diagnosis not present

## 2019-09-01 DIAGNOSIS — Z96651 Presence of right artificial knee joint: Secondary | ICD-10-CM | POA: Diagnosis not present

## 2019-09-01 DIAGNOSIS — Z4789 Encounter for other orthopedic aftercare: Secondary | ICD-10-CM | POA: Diagnosis not present

## 2019-09-03 DIAGNOSIS — Z96651 Presence of right artificial knee joint: Secondary | ICD-10-CM | POA: Diagnosis not present

## 2019-09-03 DIAGNOSIS — R262 Difficulty in walking, not elsewhere classified: Secondary | ICD-10-CM | POA: Diagnosis not present

## 2019-09-03 DIAGNOSIS — Z4789 Encounter for other orthopedic aftercare: Secondary | ICD-10-CM | POA: Diagnosis not present

## 2019-09-08 DIAGNOSIS — Z96651 Presence of right artificial knee joint: Secondary | ICD-10-CM | POA: Diagnosis not present

## 2019-09-08 DIAGNOSIS — Z4789 Encounter for other orthopedic aftercare: Secondary | ICD-10-CM | POA: Diagnosis not present

## 2019-09-08 DIAGNOSIS — R262 Difficulty in walking, not elsewhere classified: Secondary | ICD-10-CM | POA: Diagnosis not present

## 2019-09-09 ENCOUNTER — Ambulatory Visit: Payer: Medicare HMO

## 2019-09-09 ENCOUNTER — Other Ambulatory Visit: Payer: Self-pay | Admitting: Family Medicine

## 2019-09-10 DIAGNOSIS — R262 Difficulty in walking, not elsewhere classified: Secondary | ICD-10-CM | POA: Diagnosis not present

## 2019-09-10 DIAGNOSIS — Z96651 Presence of right artificial knee joint: Secondary | ICD-10-CM | POA: Diagnosis not present

## 2019-09-10 DIAGNOSIS — Z4789 Encounter for other orthopedic aftercare: Secondary | ICD-10-CM | POA: Diagnosis not present

## 2019-09-15 DIAGNOSIS — Z4789 Encounter for other orthopedic aftercare: Secondary | ICD-10-CM | POA: Diagnosis not present

## 2019-09-15 DIAGNOSIS — Z96651 Presence of right artificial knee joint: Secondary | ICD-10-CM | POA: Diagnosis not present

## 2019-09-15 DIAGNOSIS — R262 Difficulty in walking, not elsewhere classified: Secondary | ICD-10-CM | POA: Diagnosis not present

## 2019-09-17 DIAGNOSIS — R262 Difficulty in walking, not elsewhere classified: Secondary | ICD-10-CM | POA: Diagnosis not present

## 2019-09-17 DIAGNOSIS — Z4789 Encounter for other orthopedic aftercare: Secondary | ICD-10-CM | POA: Diagnosis not present

## 2019-09-17 DIAGNOSIS — Z96651 Presence of right artificial knee joint: Secondary | ICD-10-CM | POA: Diagnosis not present

## 2019-09-23 DIAGNOSIS — Z96651 Presence of right artificial knee joint: Secondary | ICD-10-CM | POA: Diagnosis not present

## 2019-09-23 DIAGNOSIS — Z471 Aftercare following joint replacement surgery: Secondary | ICD-10-CM | POA: Diagnosis not present

## 2019-09-24 ENCOUNTER — Other Ambulatory Visit: Payer: Self-pay

## 2019-09-24 ENCOUNTER — Ambulatory Visit (INDEPENDENT_AMBULATORY_CARE_PROVIDER_SITE_OTHER): Payer: Medicare HMO | Admitting: Family Medicine

## 2019-09-24 ENCOUNTER — Encounter: Payer: Self-pay | Admitting: Family Medicine

## 2019-09-24 VITALS — BP 120/60 | HR 74 | Temp 98.2°F | Ht 67.0 in | Wt 200.6 lb

## 2019-09-24 DIAGNOSIS — I4891 Unspecified atrial fibrillation: Secondary | ICD-10-CM

## 2019-09-24 DIAGNOSIS — H8111 Benign paroxysmal vertigo, right ear: Secondary | ICD-10-CM | POA: Diagnosis not present

## 2019-09-24 DIAGNOSIS — N401 Enlarged prostate with lower urinary tract symptoms: Secondary | ICD-10-CM

## 2019-09-24 DIAGNOSIS — E785 Hyperlipidemia, unspecified: Secondary | ICD-10-CM

## 2019-09-24 DIAGNOSIS — Z0001 Encounter for general adult medical examination with abnormal findings: Secondary | ICD-10-CM | POA: Diagnosis not present

## 2019-09-24 DIAGNOSIS — I2510 Atherosclerotic heart disease of native coronary artery without angina pectoris: Secondary | ICD-10-CM

## 2019-09-24 DIAGNOSIS — I10 Essential (primary) hypertension: Secondary | ICD-10-CM | POA: Diagnosis not present

## 2019-09-24 DIAGNOSIS — Z950 Presence of cardiac pacemaker: Secondary | ICD-10-CM

## 2019-09-24 DIAGNOSIS — I700 Atherosclerosis of aorta: Secondary | ICD-10-CM

## 2019-09-24 DIAGNOSIS — R351 Nocturia: Secondary | ICD-10-CM

## 2019-09-24 MED ORDER — NITROGLYCERIN 0.4 MG SL SUBL: 0.4000 mg | SUBLINGUAL_TABLET | SUBLINGUAL | 1 refills | Status: DC | PRN

## 2019-09-24 NOTE — Progress Notes (Signed)
Phone: [phone]   Subjective:  Patient presents today for their annual physical. Chief complaint-noted.   See problem oriented charting- ROS- full  review of systems was completed and negative  except for:  Vertigo. No worsened tinnitus. No hearing loss. No facial or extremity weakness. No slurred words or trouble swallowing. no blurry vision or double vision. No paresthesias. No confusion or word finding difficulties.   The following were reviewed and entered/updated in epic: Past Medical History:  Diagnosis Date  . Actinic keratosis   . Adenomatous colon polyp   . ALLERGIC RHINITIS   . Arthritis   . CAD, NATIVE VESSEL    a. DES x2 to RCA 2004 b. repeat PTCA to RCA 2008 c. PCI to RCA 2012 non-DES   . Cellulitis and abscess of trunk   . Diverticulosis   . Dysrhythmia   . GERD (gastroesophageal reflux disease)   . HYPERLIPIDEMIA   . HYPERTENSION   . OTH MALIG NEOPLASM SKIN OTH\T\UNSPEC PARTS FACE   . Permanent atrial fibrillation ([place])   . PONV (postoperative nausea and vomiting)   . Presence of permanent cardiac pacemaker   . Skin cancer   . Tachy-brady syndrome ([place])    a. s/p MDT dual chamber pacemaker implant complicated by pericardial effusion and micro-perforatation s/p lead revisions   Patient Active Problem List   Diagnosis Date Noted  . CAD, NATIVE VESSEL 09/27/2008    Priority: High  . PACEMAKER, PERMANENT 03/11/2007    Priority: High  . Atrial fibrillation ([place]) 02/11/2007    Priority: High  . Status post total right knee replacement 08/11/2019    Priority: Medium  . History of adenomatous polyp of colon 09/29/2014    Priority: Medium  . BPH associated with nocturia 09/09/2014    Priority: Medium  . Hyperlipidemia 03/10/2008    Priority: Medium  . Essential hypertension 12/02/2006    Priority: Medium  . Aortic atherosclerosis ([place]) 10/28/2018    Priority: Low  . Encounter for therapeutic drug monitoring 09/24/2013    Priority: Low  . History of  thrombocytopenia 06/17/2011    Priority: Low  . Hx of skin cancer, basal cell 04/07/2008    Priority: Low  . Actinic keratosis 04/07/2008    Priority: Low  . Allergic rhinitis 12/02/2006    Priority: Low  . GERD 12/02/2006    Priority: Low  . Obese 08/12/2019  . Complete heart block ([place]) 04/02/2019  . Benign microscopic hematuria 10/02/2018  . Nocturia 10/02/2018  . Long term (current) use of anticoagulants 02/18/2017   Past Surgical History:  Procedure Laterality Date  . BACK SURGERY  2000   "for bulging discs"  . CATARACT EXTRACTION W/ INTRAOCULAR LENS  IMPLANT, BILATERAL  2012  . CORONARY ANGIOPLASTY WITH STENT PLACEMENT  2008  . CORONARY ANGIOPLASTY WITH STENT PLACEMENT  05/14/11  . CYSTECTOMY     from right side of face.  . EP IMPLANTABLE DEVICE N/A 12/16/2014   Procedure: PPM Generator Changeout;  Surgeon: Evans Lance, MD;  Location: [facility];  Service: Cardiovascular;  Laterality: N/A;  . LEAD REVISION  2008   11 days post initial implant, RA and RV lead revisions for micro-perforation and pericardial effusion by Dr Lovena Le  . LEFT HEART CATHETERIZATION WITH CORONARY ANGIOGRAM N/A 05/14/2011   Procedure: LEFT HEART CATHETERIZATION WITH CORONARY ANGIOGRAM;  Surgeon: Hillary Bow, MD;  Location: [facility] CATH LAB;  Service: Cardiovascular;  Laterality: N/A;  . PACEMAKER INSERTION  2008   MDT dual chamber  pacemaker implanted by Dr Percival Spanish  . TOTAL KNEE ARTHROPLASTY Right 08/11/2019   Procedure: TOTAL KNEE ARTHROPLASTY;  Surgeon: Paralee Cancel, MD;  Location: [facility];  Service: Orthopedics;  Laterality: Right;  70 mins    Family History  Problem Relation Age of Onset  . Ovarian cancer Mother   . Heart attack Father   . Colon cancer Father 53       deceased 49  . Diabetes Brother        x 2  . Heart disease Brother        x 3    Medications- reviewed and updated Current Outpatient Medications  Medication Sig Dispense Refill  . atorvastatin (LIPITOR) 40 MG  tablet TAKE 1 TABLET (40 MG TOTAL) BY MOUTH DAILY AT 6 PM. 90 tablet 0  . diltiazem (CARDIZEM CD) 240 MG 24 hr capsule TAKE 1 CAPSULE EVERY DAY 90 capsule 0  . docusate sodium (COLACE) 100 MG capsule Take 1 capsule (100 mg total) by mouth 2 (two) times daily. 28 capsule 0  . finasteride (PROSCAR) 5 MG tablet Take 5 mg by mouth daily.     . fluticasone (FLONASE) 50 MCG/ACT nasal spray USE 2 SPRAYS IN EACH NOSTRIL EVERY DAY (Patient taking differently: Place 2 sprays into both nostrils daily. ) 48 g 0  . metoprolol succinate (TOPROL-XL) 50 MG 24 hr tablet Take with or immediately following a meal. (Patient taking differently: Take 75 mg by mouth daily. Take with or immediately following a meal.) 135 tablet 0  . Multiple Vitamins-Minerals (MULTIVITAMIN & MINERAL PO) Take 1 tablet by mouth every other day.     . nitroGLYCERIN (NITROSTAT) 0.4 MG SL tablet Place 1 tablet (0.4 mg total) under the tongue every 5 (five) minutes as needed for chest pain. 25 tablet 1  . omeprazole (PRILOSEC) 20 MG capsule Take 1 capsule (20 mg total) by mouth daily. 90 capsule 3  . polyethylene glycol (MIRALAX / GLYCOLAX) 17 g packet Take 17 g by mouth 2 (two) times daily. 28 packet 0  . tamsulosin (FLOMAX) 0.4 MG CAPS capsule TAKE 2 CAPSULES EVERY DAY (Patient taking differently: Take 0.8 mg by mouth daily. ) 180 capsule 0  . warfarin (COUMADIN) 2.5 MG tablet TAKE 1 TABLET DAILY EXCEPT TAKE 2 TABLETS ON MONDAYS OR AS DIRECTED BY THE COUMADIN CLINIC 120 tablet 0  . ferrous sulfate (FERROUSUL) 325 (65 FE) MG tablet Take 1 tablet (325 mg total) by mouth 3 (three) times daily with meals for 14 days. 42 tablet 0   No current facility-administered medications for this visit.    Allergies-reviewed and updated No Known Allergies  Social History   Social History Narrative   Widowed March 2016. 2 sons. 4 grandkids- all through college. 1 greatgrandchild on way 11/2015.    Lives by himself.       Worked on railroad- Designer, jewellery for 35 years.       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   Objective  Objective:  BP 120/60   Pulse 74   Temp 98.2 F (36.8 C)   Ht 5\' 7"  (1.702 m)   Wt 200 lb 9.6 oz (91 kg)   SpO2 97%   BMI 31.42 kg/m  Gen: NAD, resting comfortably HEENT: Mucous membranes are moist. Oropharynx normal Neck: no thyromegaly CV: RRR no murmurs rubs or gallops Lungs: CTAB no crackles, wheeze, rhonchi Abdomen: soft/nontender/nondistended/normal bowel sounds. No rebound  or guarding.  Ext: 1+  Edema- slightly worse on right Skin: warm, dry Neuro: grossly normal, moves all extremities, blown pupil on the right    Assessment and Plan  85 y.o. male presenting for annual physical.  Health Maintenance counseling: 1. Anticipatory guidance: Patient counseled regarding regular dental exams -q6 months, eye exams yearly,  avoiding smoking and second hand smoke , limiting alcohol to 0 beverages per day.   2. Risk factor reduction:  Advised patient of need for regular exercise and diet rich and fruits and vegetables to reduce risk of heart attack and stroke. Exercise- none due to knee surgery. Diet-weight stable-we discussed in the long run weight maintenance or slight weight loss would be preferred-he feels like he could cut down on portion sizes  Wt Readings from Last 3 Encounters:  09/24/19 200 lb 9.6 oz (91 kg)  08/11/19 200 lb 9 oz (91 kg)  08/07/19 200 lb 9 oz (91 kg)  3. Immunizations/screenings/ancillary studies-fully up-to-date other than Shingrix- wants to wait until next year- more healed up from knee  Immunization History  Administered Date(s) Administered  . Fluad Quad(high Dose 65+) 04/01/2019  . Influenza Split 03/24/2012  . Influenza Whole 03/10/2008, 03/16/2009, 04/05/2010  . Influenza, High Dose Seasonal PF 04/02/2016, 04/08/2017, 03/19/2018  . Influenza,inj,Quad PF,6+ Mos 03/30/2013, 03/16/2014, 03/11/2015  .  PFIZER SARS-COV-2 Vaccination 06/09/2019, 07/15/2019  . Pneumococcal Conjugate-13 09/09/2014  . Pneumococcal Polysaccharide-23 11/05/2011  . Tdap 11/05/2011  4. Prostate cancer screening- past age based screening recommendations. Lab Results  Component Value Date   PSA 2.8 09/08/2018   PSA 3.14 07/07/2018   PSA 2.20 05/17/2014   5. Colon cancer screening -February 17, 2016.  Patient did have adenomatous colon polyp.  Gastroenterology was deferring colon cancer screening decision to primary care at that time-we discussed reevaluating next year- lean away from doing this due to coumadin  6. Skin cancer screening-history of skin cancer-follow-up with dermatology regularly- had recent skin cancer removedadvised regular sunscreen use. Denies worrisome, changing, or new skin lesions.  7. Never smoker  Status of chronic or acute concerns   #See acute problem note on vertigo from today  #Right knee replacement-completed March 2021-patient reports overall he is doing well-some soreness still in the knee.  He has had lower appetite since the surgery but has not lost any significant weight-we will continue to monitor  #CAD/heart block with pacemaker/atrial fibrillation S: Patient with close cardiology follow-up with Dr. Lovena Le as well as Dr. Percival Spanish.  He is chest pain-free.  He is on Coumadin.  For rate control- He is on metoprolol 50 mg extended release and tolerating this well along with diltiazem 240 mg extended release.  He is not on aspirin as a result.  He is on atorvastatin 40 mg for lipids. A/P: CAD stable-asymptomatic.  Continue warfarin and statin.  Discussed refill nitroglycerin- he is not using but last refill 2019 so will refill   Heart block with pacemaker-pacemaker in place.  Continue cardiology follow-up  Atrial fibrillation-anticoagulated with Coumadin.  Rate controlled with metoprolol 50 mg XR and diltiazem as above  #hyperlipidemia S: Medication: Atorvastatin 40 mg Lab Results   Component Value Date   CHOL 109 07/07/2018   HDL 31.00 (L) 07/07/2018   LDLCALC 45 07/07/2018   LDLDIRECT 54.0 11/21/2015   TRIG 166.0 (H) 07/07/2018   CHOLHDL 4 07/07/2018   A/P: Excellent control with last LDL of 45-now due for follow-up.  Update lipid panel with labs today.   #hypertension S: medication: Diltiazem  240 mg extended release and metoprolol 50 mg extended release Home readings #s: usually looks good- was a little high when had vertigo episodes BP Readings from Last 3 Encounters:  09/24/19 120/60  08/12/19 131/65  08/07/19 (!) 141/69  A/P: Stable. Continue current medications.    #BPH-patient remains on Flomax 0.8 mg and finasteride 5 mg.  Reasonable control-continue current medication  #Aortic atherosclerosis (HCC)-continue risk factor modification as above for hyperlipidemia and hypertension  # allergies- flonase for allergies  # diarrhea- 2-3 bowel movements every day.  Some urgency. He notes that his bowels are watery or loose.  This has been going on at least since his surgery if not longer. Imodium helped- can use sparingly. pepto bismol also helps. He is going to try some more pepto bismol. Decline sstool studies for now- if persisted to next visit could do fstool studies or referral back to GI.   Recommended follow up: 64-month follow-up recommended.  Definitely 1 year for physical at the latest Future Appointments  Date Time Provider [place]  10/07/2019  9:30 AM [facility]  10/19/2019  8:05 AM [facility] DEVICE REMOTES [facility]  01/18/2020  8:05 AM [facility] DEVICE REMOTES [facility]  04/18/2020  8:05 AM [facility] DEVICE REMOTES [facility]    Lab/Order associations: fasting   ICD-10-CM   1. Preventative health care  Z00.00   2. Atherosclerosis of native coronary artery of native heart without angina pectoris  I25.10   3. PACEMAKER, PERMANENT  Z95.0   4. Atrial  fibrillation, unspecified type ([place])  I48.91   5. Hyperlipidemia, unspecified hyperlipidemia type  E78.5   6. Essential hypertension  I10   7. BPH associated with nocturia  N40.1    R35.1   8. Aortic atherosclerosis (HCC)  I70.0     No orders of the defined types were placed in this encounter.   Return precautions advised.  Garret Reddish, MD

## 2019-09-24 NOTE — Assessment & Plan Note (Signed)
S: Vertigo-a week ago patient noted with head movement that he would start to feel some imbalance/like the room was moving.  This happened even with laying down into the bed.  Seem to be doing better but then had it again this morning.  He denies hearing loss or tinnitus. A/P: Patient with new onset BPPV.  Dix-Hallpike maneuver to the right with recurrence of symptoms-actually more severe than his regular symptoms as well as nystagmus noted -Typically I would consider meclizine but with BPH I will hold off. -At his age I think he may struggle with doing maneuvers at home so we will refer him to formal physical therapy today. -Neurological exam was reassuring and no flags for possible stroke or mass -He knows to reach out if he has new or worsening symptoms.

## 2019-09-24 NOTE — Patient Instructions (Addendum)
Please stop by lab before you go If you have mychart- we will send your results within 3 business days of [place] receiving them.  If you do not have mychart- we will call you about results within 5 business days of [place] receiving them.    Please stop by the checkout desk-I want them to set you up with a visit with this physical therapy-specifically with vestibular rehab.  No other changes today-I did refill your nitroglycerin

## 2019-09-24 NOTE — Progress Notes (Signed)
Phone [phone] In person visit   Subjective:   Shawn [name] is a 85 y.o. year old very pleasant male patient who presents for/with See problem oriented charting  This visit occurred during the SARS-CoV-2 public health emergency.  Safety protocols were in place, including screening questions prior to the visit, additional usage of staff PPE, and extensive cleaning of exam room while observing appropriate contact time as indicated for disinfecting solutions.   Past Medical History-  Patient Active Problem List   Diagnosis Date Noted  . CAD, NATIVE VESSEL 09/27/2008    Priority: High  . PACEMAKER, PERMANENT 03/11/2007    Priority: High  . Atrial fibrillation ([place]) 02/11/2007    Priority: High  . Benign paroxysmal positional vertigo of right ear 09/24/2019    Priority: Medium  . Status post total right knee replacement 08/11/2019    Priority: Medium  . History of adenomatous polyp of colon 09/29/2014    Priority: Medium  . BPH associated with nocturia 09/09/2014    Priority: Medium  . Hyperlipidemia 03/10/2008    Priority: Medium  . Essential hypertension 12/02/2006    Priority: Medium  . Aortic atherosclerosis ([place]) 10/28/2018    Priority: Low  . Encounter for therapeutic drug monitoring 09/24/2013    Priority: Low  . History of thrombocytopenia 06/17/2011    Priority: Low  . Hx of skin cancer, basal cell 04/07/2008    Priority: Low  . Actinic keratosis 04/07/2008    Priority: Low  . Allergic rhinitis 12/02/2006    Priority: Low  . GERD 12/02/2006    Priority: Low  . Obese 08/12/2019  . Complete heart block ([place]) 04/02/2019  . Benign microscopic hematuria 10/02/2018  . Nocturia 10/02/2018  . Long term (current) use of anticoagulants 02/18/2017    Medications- reviewed and updated Current Outpatient Medications  Medication Sig Dispense Refill  . atorvastatin (LIPITOR) 40 MG tablet TAKE 1 TABLET (40 MG TOTAL) BY MOUTH DAILY AT 6 PM. 90 tablet 0  .  diltiazem (CARDIZEM CD) 240 MG 24 hr capsule TAKE 1 CAPSULE EVERY DAY 90 capsule 0  . finasteride (PROSCAR) 5 MG tablet Take 5 mg by mouth daily.     . fluticasone (FLONASE) 50 MCG/ACT nasal spray USE 2 SPRAYS IN EACH NOSTRIL EVERY DAY (Patient taking differently: Place 2 sprays into both nostrils daily. ) 48 g 0  . metoprolol succinate (TOPROL-XL) 50 MG 24 hr tablet Take with or immediately following a meal. (Patient taking differently: Take 75 mg by mouth daily. Take with or immediately following a meal.) 135 tablet 0  . Multiple Vitamins-Minerals (MULTIVITAMIN & MINERAL PO) Take 1 tablet by mouth every other day.     . nitroGLYCERIN (NITROSTAT) 0.4 MG SL tablet Place 1 tablet (0.4 mg total) under the tongue every 5 (five) minutes as needed for chest pain. 25 tablet 1  . omeprazole (PRILOSEC) 20 MG capsule Take 1 capsule (20 mg total) by mouth daily. 90 capsule 3  . polyethylene glycol (MIRALAX / GLYCOLAX) 17 g packet Take 17 g by mouth 2 (two) times daily. 28 packet 0  . tamsulosin (FLOMAX) 0.4 MG CAPS capsule TAKE 2 CAPSULES EVERY DAY (Patient taking differently: Take 0.8 mg by mouth daily. ) 180 capsule 0  . warfarin (COUMADIN) 2.5 MG tablet TAKE 1 TABLET DAILY EXCEPT TAKE 2 TABLETS ON MONDAYS OR AS DIRECTED BY THE COUMADIN CLINIC 120 tablet 0  . ferrous sulfate (FERROUSUL) 325 (65 FE) MG tablet Take 1 tablet (325 mg total) by  mouth 3 (three) times daily with meals for 14 days. 42 tablet 0   No current facility-administered medications for this visit.     Objective:  BP 120/60   Pulse 74   Temp 98.2 F (36.8 C)   Ht 5\' 7"  (1.702 m)   Wt 200 lb 9.6 oz (91 kg)   SpO2 97%   BMI 31.42 kg/m  Gen: Not distressed-appears anxious with Dix-Hallpike to the right.  Neuro: CN II-XII intact, sensation and reflexes normal throughout, 5/5 muscle strength in bilateral upper and lower extremities. Normal finger to nose. Normal rapid alternating movements. No pronator drift. Normal romberg. Normal gait.    Dix-Hallpike maneuver on the left is normal.  Dix-Hallpike maneuver on the right produces nystagmus and vertigo.    Assessment and Plan   Benign paroxysmal positional vertigo of right ear S: Vertigo-a week ago patient noted with head movement that he would start to feel some imbalance/like the room was moving.  This happened even with laying down into the bed.  Seem to be doing better but then had it again this morning.  He denies hearing loss or tinnitus. A/P: Patient with new onset BPPV.  Dix-Hallpike maneuver to the right with recurrence of symptoms-actually more severe than his regular symptoms as well as nystagmus noted -Typically I would consider meclizine but with BPH I will hold off. -At his age I think he may struggle with doing maneuvers at home so we will refer him to formal physical therapy today. -Neurological exam was reassuring and no flags for possible stroke or mass -He knows to reach out if he has new or worsening symptoms.   Recommended follow up: As needed for new or worsening symptoms-otherwise Return in about 6 months (around 03/25/2020) for follow up- or sooner if needed.   Lab/Order associations:   ICD-10-CM   2. Benign paroxysmal positional vertigo of right ear  H81.11 Ambulatory referral to Physical Therapy   Return precautions advised.  Garret Reddish, MD

## 2019-09-25 DIAGNOSIS — R42 Dizziness and giddiness: Secondary | ICD-10-CM | POA: Diagnosis not present

## 2019-09-25 DIAGNOSIS — R262 Difficulty in walking, not elsewhere classified: Secondary | ICD-10-CM | POA: Diagnosis not present

## 2019-09-25 DIAGNOSIS — H81391 Other peripheral vertigo, right ear: Secondary | ICD-10-CM | POA: Diagnosis not present

## 2019-09-25 LAB — CBC WITH DIFFERENTIAL/PLATELET
Basophils Absolute: 0.2 10*3/uL — ABNORMAL HIGH (ref 0.0–0.1)
Basophils Relative: 1.5 % (ref 0.0–3.0)
Eosinophils Absolute: 0.8 10*3/uL — ABNORMAL HIGH (ref 0.0–0.7)
Eosinophils Relative: 7.1 % — ABNORMAL HIGH (ref 0.0–5.0)
HCT: 40.9 % (ref 39.0–52.0)
Hemoglobin: 13.5 g/dL (ref 13.0–17.0)
Lymphocytes Relative: 20.3 % (ref 12.0–46.0)
Lymphs Abs: 2.2 10*3/uL (ref 0.7–4.0)
MCHC: 33.1 g/dL (ref 30.0–36.0)
MCV: 94 fl (ref 78.0–100.0)
Monocytes Absolute: 1.1 10*3/uL — ABNORMAL HIGH (ref 0.1–1.0)
Monocytes Relative: 10.2 % (ref 3.0–12.0)
Neutro Abs: 6.5 10*3/uL (ref 1.4–7.7)
Neutrophils Relative %: 60.9 % (ref 43.0–77.0)
Platelets: 201 10*3/uL (ref 150.0–400.0)
RBC: 4.35 Mil/uL (ref 4.22–5.81)
RDW: 13.9 % (ref 11.5–15.5)
WBC: 10.7 10*3/uL — ABNORMAL HIGH (ref 4.0–10.5)

## 2019-09-25 LAB — COMPREHENSIVE METABOLIC PANEL
ALT: 15 U/L (ref 0–53)
AST: 15 U/L (ref 0–37)
Albumin: 3.9 g/dL (ref 3.5–5.2)
Alkaline Phosphatase: 74 U/L (ref 39–117)
BUN: 13 mg/dL (ref 6–23)
CO2: 29 mEq/L (ref 19–32)
Calcium: 8.9 mg/dL (ref 8.4–10.5)
Chloride: 106 mEq/L (ref 96–112)
Creatinine, Ser: 0.95 mg/dL (ref 0.40–1.50)
GFR: 75.77 mL/min (ref >60.00)
Glucose, Bld: 107 mg/dL — ABNORMAL HIGH (ref 70–99)
Potassium: 4.3 mEq/L (ref 3.5–5.1)
Sodium: 142 mEq/L (ref 135–145)
Total Bilirubin: 0.4 mg/dL (ref 0.2–1.2)
Total Protein: 6.5 g/dL (ref 6.0–8.3)

## 2019-09-25 LAB — LIPID PANEL
Cholesterol: 103 mg/dL (ref 0–200)
HDL: 32.1 mg/dL — ABNORMAL LOW (ref >39.00)
NonHDL: 70.8
Total CHOL/HDL Ratio: 3
Triglycerides: 214 mg/dL — ABNORMAL HIGH (ref 0.0–149.0)
VLDL: 42.8 mg/dL — ABNORMAL HIGH (ref 0.0–40.0)

## 2019-09-25 LAB — LDL CHOLESTEROL, DIRECT: Direct LDL: 46 mg/dL

## 2019-10-07 ENCOUNTER — Ambulatory Visit (INDEPENDENT_AMBULATORY_CARE_PROVIDER_SITE_OTHER): Payer: Medicare HMO | Admitting: General Practice

## 2019-10-07 ENCOUNTER — Other Ambulatory Visit: Payer: Self-pay

## 2019-10-07 DIAGNOSIS — Z7901 Long term (current) use of anticoagulants: Secondary | ICD-10-CM | POA: Diagnosis not present

## 2019-10-07 DIAGNOSIS — I4891 Unspecified atrial fibrillation: Secondary | ICD-10-CM

## 2019-10-07 LAB — POCT INR: INR: 2.6 (ref 2.0–3.0)

## 2019-10-07 NOTE — Patient Instructions (Signed)
Pre visit review using our clinic review tool, if applicable. No additional management support is needed unless otherwise documented below in the visit note.  Take 1 tablet daily except 2 tablets on Mondays only. Re-check in 6 weeks.  

## 2019-10-07 NOTE — Progress Notes (Signed)
I have reviewed and agree with note, evaluation, plan.   Rhemi Balbach, MD  

## 2019-10-19 ENCOUNTER — Ambulatory Visit (INDEPENDENT_AMBULATORY_CARE_PROVIDER_SITE_OTHER): Payer: Medicare HMO | Admitting: *Deleted

## 2019-10-19 DIAGNOSIS — I4891 Unspecified atrial fibrillation: Secondary | ICD-10-CM

## 2019-10-19 DIAGNOSIS — I442 Atrioventricular block, complete: Secondary | ICD-10-CM | POA: Diagnosis not present

## 2019-10-19 LAB — CUP PACEART REMOTE DEVICE CHECK
Battery Impedance: 1501 Ohm
Battery Remaining Longevity: 48 mo
Battery Voltage: 2.76 V
Brady Statistic RV Percent Paced: 68 %
Date Time Interrogation Session: 20210517081434
Implantable Lead Implant Date: 20081007
Implantable Lead Implant Date: 20081007
Implantable Lead Location: 753859
Implantable Lead Location: 753860
Implantable Lead Model: 5076
Implantable Lead Model: 5076
Implantable Pulse Generator Implant Date: 20160714
Lead Channel Impedance Value: 0 Ohm
Lead Channel Impedance Value: 478 Ohm
Lead Channel Pacing Threshold Amplitude: 0.625 V
Lead Channel Pacing Threshold Pulse Width: 0.4 ms
Lead Channel Setting Pacing Amplitude: 2 V
Lead Channel Setting Pacing Pulse Width: 0.4 ms
Lead Channel Setting Sensing Sensitivity: 1.4 mV

## 2019-10-20 NOTE — Progress Notes (Signed)
Remote pacemaker transmission.   

## 2019-10-29 DIAGNOSIS — H40021 Open angle with borderline findings, high risk, right eye: Secondary | ICD-10-CM | POA: Diagnosis not present

## 2019-10-29 DIAGNOSIS — H04123 Dry eye syndrome of bilateral lacrimal glands: Secondary | ICD-10-CM | POA: Diagnosis not present

## 2019-10-29 DIAGNOSIS — Z961 Presence of intraocular lens: Secondary | ICD-10-CM | POA: Diagnosis not present

## 2019-10-29 DIAGNOSIS — H35371 Puckering of macula, right eye: Secondary | ICD-10-CM | POA: Diagnosis not present

## 2019-11-14 ENCOUNTER — Other Ambulatory Visit: Payer: Self-pay | Admitting: Family Medicine

## 2019-11-18 ENCOUNTER — Ambulatory Visit (INDEPENDENT_AMBULATORY_CARE_PROVIDER_SITE_OTHER): Payer: Medicare HMO | Admitting: General Practice

## 2019-11-18 ENCOUNTER — Other Ambulatory Visit: Payer: Self-pay

## 2019-11-18 DIAGNOSIS — I4891 Unspecified atrial fibrillation: Secondary | ICD-10-CM | POA: Diagnosis not present

## 2019-11-18 DIAGNOSIS — Z7901 Long term (current) use of anticoagulants: Secondary | ICD-10-CM

## 2019-11-18 LAB — POCT INR: INR: 2.2 (ref 2.0–3.0)

## 2019-11-18 MED ORDER — FINASTERIDE 5 MG PO TABS: 5.0000 mg | ORAL_TABLET | Freq: Every day | ORAL | 2 refills | Status: DC

## 2019-11-18 NOTE — Patient Instructions (Signed)
Pre visit review using our clinic review tool, if applicable. No additional management support is needed unless otherwise documented below in the visit note.  Take 1 tablet daily except 2 tablets on Mondays only. Re-check in 6 weeks.  

## 2019-11-19 ENCOUNTER — Other Ambulatory Visit: Payer: Self-pay | Admitting: Family Medicine

## 2019-11-19 DIAGNOSIS — E785 Hyperlipidemia, unspecified: Secondary | ICD-10-CM

## 2019-11-19 DIAGNOSIS — Z7901 Long term (current) use of anticoagulants: Secondary | ICD-10-CM

## 2019-11-19 NOTE — Progress Notes (Signed)
I have reviewed and agree with note, evaluation, plan.   Willford Rabideau, MD  

## 2019-12-02 DIAGNOSIS — H40021 Open angle with borderline findings, high risk, right eye: Secondary | ICD-10-CM | POA: Diagnosis not present

## 2019-12-02 DIAGNOSIS — H04123 Dry eye syndrome of bilateral lacrimal glands: Secondary | ICD-10-CM | POA: Diagnosis not present

## 2019-12-30 ENCOUNTER — Ambulatory Visit (INDEPENDENT_AMBULATORY_CARE_PROVIDER_SITE_OTHER): Payer: Medicare HMO | Admitting: General Practice

## 2019-12-30 ENCOUNTER — Other Ambulatory Visit: Payer: Self-pay

## 2019-12-30 DIAGNOSIS — I4891 Unspecified atrial fibrillation: Secondary | ICD-10-CM | POA: Diagnosis not present

## 2019-12-30 DIAGNOSIS — Z7901 Long term (current) use of anticoagulants: Secondary | ICD-10-CM

## 2019-12-30 LAB — POCT INR: INR: 2 (ref 2.0–3.0)

## 2019-12-30 NOTE — Patient Instructions (Addendum)
Pre visit review using our clinic review tool, if applicable. No additional management support is needed unless otherwise documented below in the visit note.  Take 1 tablet daily except 2 tablets on Mondays only. Re-check in 6 weeks.  

## 2019-12-30 NOTE — Progress Notes (Signed)
I have reviewed and agree with note, evaluation, plan.   Ramina Hulet, MD  

## 2020-01-18 ENCOUNTER — Other Ambulatory Visit: Payer: Self-pay | Admitting: Family Medicine

## 2020-01-18 ENCOUNTER — Ambulatory Visit (INDEPENDENT_AMBULATORY_CARE_PROVIDER_SITE_OTHER): Payer: Medicare HMO | Admitting: *Deleted

## 2020-01-18 DIAGNOSIS — I442 Atrioventricular block, complete: Secondary | ICD-10-CM

## 2020-01-18 DIAGNOSIS — E785 Hyperlipidemia, unspecified: Secondary | ICD-10-CM

## 2020-01-18 LAB — CUP PACEART REMOTE DEVICE CHECK
Battery Impedance: 1612 Ohm
Battery Remaining Longevity: 46 mo
Battery Voltage: 2.76 V
Brady Statistic RV Percent Paced: 70 %
Date Time Interrogation Session: 20210816092317
Implantable Lead Implant Date: 20081007
Implantable Lead Implant Date: 20081007
Implantable Lead Location: 753859
Implantable Lead Location: 753860
Implantable Lead Model: 5076
Implantable Lead Model: 5076
Implantable Pulse Generator Implant Date: 20160714
Lead Channel Impedance Value: 0 Ohm
Lead Channel Impedance Value: 490 Ohm
Lead Channel Pacing Threshold Amplitude: 0.75 V
Lead Channel Pacing Threshold Pulse Width: 0.4 ms
Lead Channel Setting Pacing Amplitude: 2 V
Lead Channel Setting Pacing Pulse Width: 0.4 ms
Lead Channel Setting Sensing Sensitivity: 1.4 mV

## 2020-01-20 NOTE — Progress Notes (Signed)
Remote pacemaker transmission.   

## 2020-01-23 ENCOUNTER — Other Ambulatory Visit: Payer: Self-pay | Admitting: Family Medicine

## 2020-02-10 ENCOUNTER — Ambulatory Visit (INDEPENDENT_AMBULATORY_CARE_PROVIDER_SITE_OTHER): Payer: Medicare HMO | Admitting: General Practice

## 2020-02-10 ENCOUNTER — Ambulatory Visit: Payer: Medicare HMO

## 2020-02-10 ENCOUNTER — Other Ambulatory Visit: Payer: Self-pay | Admitting: General Practice

## 2020-02-10 ENCOUNTER — Other Ambulatory Visit: Payer: Self-pay

## 2020-02-10 DIAGNOSIS — Z7901 Long term (current) use of anticoagulants: Secondary | ICD-10-CM

## 2020-02-10 DIAGNOSIS — I4891 Unspecified atrial fibrillation: Secondary | ICD-10-CM | POA: Diagnosis not present

## 2020-02-10 DIAGNOSIS — I10 Essential (primary) hypertension: Secondary | ICD-10-CM

## 2020-02-10 LAB — POCT INR: INR: 2.5 (ref 2.0–3.0)

## 2020-02-10 MED ORDER — WARFARIN SODIUM 2.5 MG PO TABS: ORAL_TABLET | ORAL | 1 refills | Status: DC

## 2020-02-10 MED ORDER — METOPROLOL SUCCINATE ER 50 MG PO TB24: 75.0000 mg | ORAL_TABLET | Freq: Every day | ORAL | 1 refills | Status: DC

## 2020-02-10 NOTE — Patient Instructions (Addendum)
Pre visit review using our clinic review tool, if applicable. No additional management support is needed unless otherwise documented below in the visit note.  Take 1 tablet daily except 2 tablets on Mondays only. Re-check in 6 weeks.  

## 2020-02-10 NOTE — Progress Notes (Signed)
I have reviewed and agree with note, evaluation, plan.   Raine Blodgett, MD  

## 2020-03-10 DIAGNOSIS — H35371 Puckering of macula, right eye: Secondary | ICD-10-CM | POA: Diagnosis not present

## 2020-03-10 DIAGNOSIS — H401112 Primary open-angle glaucoma, right eye, moderate stage: Secondary | ICD-10-CM | POA: Diagnosis not present

## 2020-03-10 DIAGNOSIS — Z961 Presence of intraocular lens: Secondary | ICD-10-CM | POA: Diagnosis not present

## 2020-03-10 DIAGNOSIS — H04123 Dry eye syndrome of bilateral lacrimal glands: Secondary | ICD-10-CM | POA: Diagnosis not present

## 2020-03-10 DIAGNOSIS — H353131 Nonexudative age-related macular degeneration, bilateral, early dry stage: Secondary | ICD-10-CM | POA: Diagnosis not present

## 2020-03-23 ENCOUNTER — Other Ambulatory Visit: Payer: Self-pay

## 2020-03-23 ENCOUNTER — Ambulatory Visit (INDEPENDENT_AMBULATORY_CARE_PROVIDER_SITE_OTHER): Payer: Medicare HMO | Admitting: General Practice

## 2020-03-23 DIAGNOSIS — Z7901 Long term (current) use of anticoagulants: Secondary | ICD-10-CM

## 2020-03-23 DIAGNOSIS — I4891 Unspecified atrial fibrillation: Secondary | ICD-10-CM | POA: Diagnosis not present

## 2020-03-23 LAB — POCT INR: INR: 2.5 (ref 2.0–3.0)

## 2020-03-23 NOTE — Patient Instructions (Addendum)
Pre visit review using our clinic review tool, if applicable. No additional management support is needed unless otherwise documented below in the visit note.  Take 1 tablet daily except 2 tablets on Mondays only. Re-check in 6 weeks.  

## 2020-03-23 NOTE — Progress Notes (Signed)
I have reviewed and agree with note, evaluation, plan.   Dyamon Sosinski, MD  

## 2020-03-25 ENCOUNTER — Ambulatory Visit: Payer: PRIVATE HEALTH INSURANCE | Admitting: Family Medicine

## 2020-03-28 ENCOUNTER — Other Ambulatory Visit: Payer: Self-pay

## 2020-03-28 ENCOUNTER — Ambulatory Visit (INDEPENDENT_AMBULATORY_CARE_PROVIDER_SITE_OTHER): Payer: Medicare HMO | Admitting: Family Medicine

## 2020-03-28 ENCOUNTER — Encounter: Payer: Self-pay | Admitting: Family Medicine

## 2020-03-28 ENCOUNTER — Other Ambulatory Visit: Payer: Self-pay | Admitting: Family Medicine

## 2020-03-28 VITALS — BP 120/56 | HR 68 | Temp 97.8°F | Ht 67.0 in | Wt 208.0 lb

## 2020-03-28 DIAGNOSIS — R197 Diarrhea, unspecified: Secondary | ICD-10-CM

## 2020-03-28 DIAGNOSIS — Z23 Encounter for immunization: Secondary | ICD-10-CM | POA: Diagnosis not present

## 2020-03-28 DIAGNOSIS — E785 Hyperlipidemia, unspecified: Secondary | ICD-10-CM

## 2020-03-28 DIAGNOSIS — K219 Gastro-esophageal reflux disease without esophagitis: Secondary | ICD-10-CM | POA: Diagnosis not present

## 2020-03-28 DIAGNOSIS — R195 Other fecal abnormalities: Secondary | ICD-10-CM

## 2020-03-28 DIAGNOSIS — I10 Essential (primary) hypertension: Secondary | ICD-10-CM

## 2020-03-28 NOTE — Patient Instructions (Addendum)
Health Maintenance Due  Topic Date Due  . INFLUENZA VACCINE - high dose flu shot today 01/03/2020   We will call you within two weeks about your referral to GI to consider colonoscopy potentially with ongoing loose stools. If you do not hear within 3 weeks, give [place] a call.   Please stop by lab before you go If you have mychart- we will send your results within 3 business days of [place] receiving them.  If you do not have mychart- we will call you about results within 5 business days of [place] receiving them.  *please note we are currently using [facility] which has a longer processing time than [facility] typically so labs may not come back as quickly as in the past *please also note that you will see labs on mychart as soon as they post. I will later go in and write notes on them- will say "notes from Dr. Yong Channel"

## 2020-03-28 NOTE — Progress Notes (Signed)
Phone [phone] In person visit   Subjective:   Shawn [name] is a 85 y.o. year old very pleasant male patient who presents for/with See problem oriented charting Chief Complaint  Patient presents with  . Hyperlipidemia  . Hypertension  . Diarrhea    immediately after eating food, dark in color   This visit occurred during the SARS-CoV-2 public health emergency.  Safety protocols were in place, including screening questions prior to the visit, additional usage of staff PPE, and extensive cleaning of exam room while observing appropriate contact time as indicated for disinfecting solutions.   Past Medical History-  Patient Active Problem List   Diagnosis Date Noted  . CAD, NATIVE VESSEL 09/27/2008    Priority: High  . PACEMAKER, PERMANENT 03/11/2007    Priority: High  . Atrial fibrillation ([place]) 02/11/2007    Priority: High  . Benign paroxysmal positional vertigo of right ear 09/24/2019    Priority: Medium  . Status post total right knee replacement 08/11/2019    Priority: Medium  . History of adenomatous polyp of colon 09/29/2014    Priority: Medium  . BPH associated with nocturia 09/09/2014    Priority: Medium  . Hyperlipidemia 03/10/2008    Priority: Medium  . Essential hypertension 12/02/2006    Priority: Medium  . Aortic atherosclerosis ([place]) 10/28/2018    Priority: Low  . Encounter for therapeutic drug monitoring 09/24/2013    Priority: Low  . History of thrombocytopenia 06/17/2011    Priority: Low  . Hx of skin cancer, basal cell 04/07/2008    Priority: Low  . Actinic keratosis 04/07/2008    Priority: Low  . Allergic rhinitis 12/02/2006    Priority: Low  . GERD 12/02/2006    Priority: Low  . Obese 08/12/2019  . Complete heart block ([place]) 04/02/2019  . Benign microscopic hematuria 10/02/2018  . Nocturia 10/02/2018  . Long term (current) use of anticoagulants 02/18/2017    Medications- reviewed and updated Current Outpatient Medications   Medication Sig Dispense Refill  . atorvastatin (LIPITOR) 40 MG tablet TAKE 1 TABLET (40 MG TOTAL) BY MOUTH DAILY AT 6 PM. 90 tablet 1  . diltiazem (CARDIZEM CD) 240 MG 24 hr capsule TAKE 1 CAPSULE EVERY DAY 90 capsule 0  . finasteride (PROSCAR) 5 MG tablet Take 1 tablet (5 mg total) by mouth daily. 90 tablet 2  . fluticasone (FLONASE) 50 MCG/ACT nasal spray USE 2 SPRAYS IN EACH NOSTRIL EVERY DAY (Patient taking differently: Place 2 sprays into both nostrils daily. ) 48 g 0  . metoprolol succinate (TOPROL-XL) 50 MG 24 hr tablet Take 1.5 tablets (75 mg total) by mouth daily. Take with or immediately following a meal. 135 tablet 1  . Multiple Vitamins-Minerals (MULTIVITAMIN & MINERAL PO) Take 1 tablet by mouth every other day.     . nitroGLYCERIN (NITROSTAT) 0.4 MG SL tablet Place 1 tablet (0.4 mg total) under the tongue every 5 (five) minutes as needed for chest pain. 25 tablet 1  . tamsulosin (FLOMAX) 0.4 MG CAPS capsule TAKE 2 CAPSULES EVERY DAY (Patient taking differently: Take 0.8 mg by mouth daily. ) 180 capsule 0  . warfarin (COUMADIN) 2.5 MG tablet Take 1 tablet daily except take 2 tablets on Monday or Take as directed by anticoagulation clinic 120 tablet 1   No current facility-administered medications for this visit.     Objective:  BP (!) 120/56   Pulse 68   Temp 97.8 F (36.6 C) (Temporal)   Ht 5\' 7"  (1.702  m)   Wt 208 lb (94.3 kg)   SpO2 97%   BMI 32.58 kg/m  Gen: NAD, resting comfortably CV: RRR no murmurs rubs or gallops Lungs: CTAB no crackles, wheeze, rhonchi Abdomen: soft/nontender/nondistended/normal bowel sounds. No rebound or guarding.  Ext: trace to 1+ edema Skin: warm, dry    Assessment and Plan   # Loose frequent stools S: reported 2-3 BMs per day back in April with some urgency. Some were watery or loose. Imodium helped some. pepto bismol helped some- he wanted to try some more- declined stool studies last visit as just started after knee replacement march  2021  Now consistently 3x a day and seems worse. He reported prior dark stools with pepto bismol but cleared up in the last week with avoiding use. Occasional sharp pains in lower abdomen after eating. Iron can also make stools dark- state has been off that for a while- last rx was only 14 day rx  A/P: 85 year old male on coumadin with ongoing loose stools now for over 6 months- started after knee surgery- with chronic symptoms- will refer to GI today . Occasionally feels like food may get stuck as well- want him to discuss this with GI. Marland Kitchen Doesn't feel choked. No issues with liquids generally- sometimes something like a coke will feel "stuck" and gets better with drinking  # heart block- patient with pacemaker place. Stable- continue close cardiology follow up  # Atrial fibrillation S: Rate controlled with diltiazem 240mg  XR, metoprolol 50mg  XR Anticoagulated with couamdin per schedule- 2.5mg  tabletsPatient is  followed by cardiology: sees them next month  A/P: Stable. Continue current medications.   #hypertension S: medication:  Metoprolol 50Mg  Extended release, diltiazem 240mg  XR BP Readings from Last 3 Encounters:  03/28/20 (!) 120/56  09/24/19 120/60  08/12/19 131/65  A/P: Stable. Continue current medications.   #CAD- no chest pain or shortness of breath. Has not had to use nitroglycerin #hyperlipidemia S: Medication: atorvastatin 40Mg  , coumadin (not on aspirin as a result)  Lab Results  Component Value Date   CHOL 103 09/24/2019   HDL 32.10 (L) 09/24/2019   LDLCALC 45 07/07/2018   LDLDIRECT 46.0 09/24/2019   TRIG 214.0 (H) 09/24/2019   CHOLHDL 3 09/24/2019   A/P: CAD asymptomatic- continue current meds. LDL at goal last check- continue current meds.   # PT worked well in April 2021 for BPPV- resolved with one session  # flu shot given today   Recommended follow up: Return in about 6 months (around 09/26/2020) for physical or sooner if needed. Future Appointments  Date  Time Provider [place]  04/18/2020  8:05 AM [facility] DEVICE REMOTES [facility]  05/04/2020 10:00 AM [facility] COUMADIN CLINIC [facility]  05/09/2020 11:40 AM Minus Breeding, MD [facility]  05/10/2020  2:30 PM Evans Lance, MD [facility]  07/18/2020  8:05 AM [facility] DEVICE REMOTES [facility]  10/17/2020  8:05 AM [facility] DEVICE REMOTES [facility]  01/16/2021  8:05 AM [facility] DEVICE REMOTES [facility]  04/17/2021  8:05 AM [facility] DEVICE REMOTES [facility]  07/17/2021  8:05 AM [facility] DEVICE REMOTES [facility]    Lab/Order associations:   ICD-10-CM   1. Essential hypertension  I10 CBC With Differential/Platelet    COMPLETE METABOLIC PANEL WITH GFR  2. Gastroesophageal reflux disease without esophagitis  K21.9   3. Hyperlipidemia, unspecified hyperlipidemia type  E78.5 CBC With Differential/Platelet    COMPLETE METABOLIC PANEL WITH GFR  4. Loose stools  R19.5 Ambulatory  referral to Gastroenterology  5. Diarrhea, unspecified type  R19.7    Return precautions advised.  Garret Reddish, MD

## 2020-03-29 ENCOUNTER — Telehealth: Payer: Self-pay

## 2020-03-29 LAB — CBC WITH DIFFERENTIAL/PLATELET
Absolute Monocytes: 701 cells/uL (ref 200–950)
Basophils Absolute: 31 cells/uL (ref 0–200)
Basophils Relative: 0.4 %
Eosinophils Absolute: 231 cells/uL (ref 15–500)
Eosinophils Relative: 3 %
HCT: 40.2 % (ref 38.5–50.0)
Hemoglobin: 13.2 g/dL (ref 13.2–17.1)
Lymphs Abs: 1548 cells/uL (ref 850–3900)
MCH: 30.5 pg (ref 27.0–33.0)
MCHC: 32.8 g/dL (ref 32.0–36.0)
MCV: 92.8 fL (ref 80.0–100.0)
MPV: 9.9 fL (ref 7.5–12.5)
Monocytes Relative: 9.1 %
Neutro Abs: 5190 cells/uL (ref 1500–7800)
Neutrophils Relative %: 67.4 %
Platelets: 154 10*3/uL (ref 140–400)
RBC: 4.33 10*6/uL (ref 4.20–5.80)
RDW: 13.1 % (ref 11.0–15.0)
Total Lymphocyte: 20.1 %
WBC: 7.7 10*3/uL (ref 3.8–10.8)

## 2020-03-29 LAB — COMPLETE METABOLIC PANEL WITH GFR
AG Ratio: 1.5 (calc) (ref 1.0–2.5)
ALT: 14 U/L (ref 9–46)
AST: 13 U/L (ref 10–35)
Albumin: 3.8 g/dL (ref 3.6–5.1)
Alkaline phosphatase (APISO): 68 U/L (ref 35–144)
BUN/Creatinine Ratio: 12 (calc) (ref 6–22)
BUN: 14 mg/dL (ref 7–25)
CO2: 26 mmol/L (ref 20–32)
Calcium: 8.6 mg/dL (ref 8.6–10.3)
Chloride: 106 mmol/L (ref 98–110)
Creat: 1.13 mg/dL — ABNORMAL HIGH (ref 0.70–1.11)
GFR, Est African American: 69 mL/min/{1.73_m2} (ref >=60)
GFR, Est Non African American: 60 mL/min/{1.73_m2} (ref >=60)
Globulin: 2.5 g/dL (calc) (ref 1.9–3.7)
Glucose, Bld: 142 mg/dL — ABNORMAL HIGH (ref 65–99)
Potassium: 3.8 mmol/L (ref 3.5–5.3)
Sodium: 141 mmol/L (ref 135–146)
Total Bilirubin: 0.7 mg/dL (ref 0.2–1.2)
Total Protein: 6.3 g/dL (ref 6.1–8.1)

## 2020-03-29 NOTE — Telephone Encounter (Signed)
For gas and bloating I like Gas-X/simethicone  With abdominal pain portion-if that continues despite use he needs to be evaluated with a visit

## 2020-03-29 NOTE — Telephone Encounter (Signed)
Patient called in wanting to know what PCP would recommend OTC for abdominal pain/bloating/gas

## 2020-03-30 DIAGNOSIS — H401112 Primary open-angle glaucoma, right eye, moderate stage: Secondary | ICD-10-CM | POA: Diagnosis not present

## 2020-03-30 NOTE — Telephone Encounter (Signed)
Called and gave below message to pt.

## 2020-04-01 ENCOUNTER — Other Ambulatory Visit: Payer: Self-pay | Admitting: Family Medicine

## 2020-04-04 ENCOUNTER — Telehealth: Payer: Self-pay | Admitting: Family Medicine

## 2020-04-04 NOTE — Chronic Care Management (AMB) (Signed)
  Chronic Care Management   Note  04/04/2020 Name: ASHVIK GRUNDMAN MRN: [id number] DOB: Apr 09, 1935  ERROLL WILBOURNE is a 85 y.o. year old male who is a primary care patient of Yong Channel, Brayton Mars, MD. I reached out to Sherolyn Buba by phone today in response to a referral sent by Mr. Sahir Tolson Brienza's PCP, Marin Olp, MD.   Mr. Turnley was given information about Chronic Care Management services today including:  1. CCM service includes personalized support from designated clinical staff supervised by his physician, including individualized plan of care and coordination with other care providers 2. 24/7 contact phone numbers for assistance for urgent and routine care needs. 3. Service will only be billed when office clinical staff spend 20 minutes or more in a month to coordinate care. 4. Only one practitioner may furnish and bill the service in a calendar month. 5. The patient may stop CCM services at any time (effective at the end of the month) by phone call to the office staff.   Patient agreed to services and verbal consent obtained.   Follow up plan:   Lauretta Grill [employer] Scheduler

## 2020-04-18 ENCOUNTER — Ambulatory Visit (INDEPENDENT_AMBULATORY_CARE_PROVIDER_SITE_OTHER): Payer: Medicare HMO

## 2020-04-18 DIAGNOSIS — I4891 Unspecified atrial fibrillation: Secondary | ICD-10-CM | POA: Diagnosis not present

## 2020-04-18 LAB — CUP PACEART REMOTE DEVICE CHECK
Battery Impedance: 1725 Ohm
Battery Remaining Longevity: 43 mo
Battery Voltage: 2.76 V
Brady Statistic RV Percent Paced: 75 %
Date Time Interrogation Session: 20211115085259
Implantable Lead Implant Date: 20081007
Implantable Lead Implant Date: 20081007
Implantable Lead Location: 753859
Implantable Lead Location: 753860
Implantable Lead Model: 5076
Implantable Lead Model: 5076
Implantable Pulse Generator Implant Date: 20160714
Lead Channel Impedance Value: 0 Ohm
Lead Channel Impedance Value: 510 Ohm
Lead Channel Pacing Threshold Amplitude: 0.75 V
Lead Channel Pacing Threshold Pulse Width: 0.4 ms
Lead Channel Setting Pacing Amplitude: 2 V
Lead Channel Setting Pacing Pulse Width: 0.4 ms
Lead Channel Setting Sensing Sensitivity: 1.4 mV

## 2020-04-20 NOTE — Progress Notes (Signed)
Remote pacemaker transmission.   

## 2020-05-04 ENCOUNTER — Other Ambulatory Visit: Payer: Self-pay

## 2020-05-04 ENCOUNTER — Ambulatory Visit (INDEPENDENT_AMBULATORY_CARE_PROVIDER_SITE_OTHER): Payer: Medicare HMO | Admitting: General Practice

## 2020-05-04 DIAGNOSIS — Z7901 Long term (current) use of anticoagulants: Secondary | ICD-10-CM

## 2020-05-04 DIAGNOSIS — I4891 Unspecified atrial fibrillation: Secondary | ICD-10-CM

## 2020-05-04 LAB — POCT INR: INR: 2.7 (ref 2.0–3.0)

## 2020-05-04 NOTE — Patient Instructions (Addendum)
Pre visit review using our clinic review tool, if applicable. No additional management support is needed unless otherwise documented below in the visit note.  Take 1 tablet daily except 2 tablets on Mondays only. Re-check in 6 weeks.  

## 2020-05-04 NOTE — Progress Notes (Signed)
I have reviewed and agree with note, evaluation, plan.   Vashaun Osmon, MD  

## 2020-05-08 NOTE — Progress Notes (Signed)
Cardiology Office Note   Date:  05/09/2020   ID:  Shawn [name], DOB [birth year], MRN [id number]  PCP:  Marin Olp, MD  Cardiologist:   Minus Breeding, MD   Chief Complaint  Patient presents with  . Coronary Artery Disease      History of Present Illness: Shawn [name] is a 85 y.o. male who presents for followup of atrial fib. Since I last saw him he has done well.  He gets around his yard mowing the lawn with a tractor.  He picks up leaves with a tractor.  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  He did have a knee replacement.  He did physical therapy. The patient denies any new symptoms such as chest discomfort, neck or arm discomfort. There has been no new shortness of breath, PND or orthopnea. There have been no reported palpitations, presyncope or syncope.    Past Medical History:  Diagnosis Date  . Actinic keratosis   . Adenomatous colon polyp   . ALLERGIC RHINITIS   . Arthritis   . CAD, NATIVE VESSEL    a. DES x2 to RCA 2004 b. repeat PTCA to RCA 2008 c. PCI to RCA 2012 non-DES   . Cellulitis and abscess of trunk   . Diverticulosis   . Dysrhythmia   . GERD (gastroesophageal reflux disease)   . HYPERLIPIDEMIA   . HYPERTENSION   . OTH MALIG NEOPLASM SKIN OTH\T\UNSPEC PARTS FACE   . Permanent atrial fibrillation ([place])   . PONV (postoperative nausea and vomiting)   . Presence of permanent cardiac pacemaker   . Skin cancer   . Tachy-brady syndrome ([place])    a. s/p MDT dual chamber pacemaker implant complicated by pericardial effusion and micro-perforatation s/p lead revisions    Past Surgical History:  Procedure Laterality Date  . BACK SURGERY  2000   "for bulging discs"  . CATARACT EXTRACTION W/ INTRAOCULAR LENS  IMPLANT, BILATERAL  2012  . CORONARY ANGIOPLASTY WITH STENT PLACEMENT  2008  . CORONARY ANGIOPLASTY WITH STENT PLACEMENT  05/14/11  . CYSTECTOMY     from right side of face.  . EP IMPLANTABLE DEVICE N/A 12/16/2014   Procedure:  PPM Generator Changeout;  Surgeon: Evans Lance, MD;  Location: [facility];  Service: Cardiovascular;  Laterality: N/A;  . LEAD REVISION  2008   11 days post initial implant, RA and RV lead revisions for micro-perforation and pericardial effusion by Dr Lovena Le  . LEFT HEART CATHETERIZATION WITH CORONARY ANGIOGRAM N/A 05/14/2011   Procedure: LEFT HEART CATHETERIZATION WITH CORONARY ANGIOGRAM;  Surgeon: Hillary Bow, MD;  Location: [facility] CATH LAB;  Service: Cardiovascular;  Laterality: N/A;  . PACEMAKER INSERTION  2008   MDT dual chamber pacemaker implanted by Dr Percival Spanish  . TOTAL KNEE ARTHROPLASTY Right 08/11/2019   Procedure: TOTAL KNEE ARTHROPLASTY;  Surgeon: Paralee Cancel, MD;  Location: [facility] ORS;  Service: Orthopedics;  Laterality: Right;  70 mins     Current Outpatient Medications  Medication Sig Dispense Refill  . atorvastatin (LIPITOR) 40 MG tablet TAKE 1 TABLET (40 MG TOTAL) BY MOUTH DAILY AT 6 PM. 90 tablet 1  . diltiazem (CARDIZEM CD) 240 MG 24 hr capsule TAKE 1 CAPSULE EVERY DAY 90 capsule 0  . finasteride (PROSCAR) 5 MG tablet Take 1 tablet (5 mg total) by mouth daily. 90 tablet 2  . fluticasone (FLONASE) 50 MCG/ACT nasal spray USE 2 SPRAYS IN EACH NOSTRIL EVERY DAY (Patient taking  differently: Place 2 sprays into both nostrils daily. ) 48 g 0  . metoprolol succinate (TOPROL-XL) 50 MG 24 hr tablet Take 1.5 tablets (75 mg total) by mouth daily. Take with or immediately following a meal. 135 tablet 1  . Multiple Vitamins-Minerals (MULTIVITAMIN & MINERAL PO) Take 1 tablet by mouth every other day.     . nitroGLYCERIN (NITROSTAT) 0.4 MG SL tablet Place 1 tablet (0.4 mg total) under the tongue every 5 (five) minutes as needed for chest pain. 25 tablet 1  . tamsulosin (FLOMAX) 0.4 MG CAPS capsule TAKE 2 CAPSULES EVERY DAY (Patient taking differently: Take 0.8 mg by mouth daily. ) 180 capsule 0  . warfarin (COUMADIN) 2.5 MG tablet Take 1 tablet daily except take 2 tablets on Monday or  Take as directed by anticoagulation clinic 120 tablet 1   No current facility-administered medications for this visit.    Allergies:   Patient has no known allergies.    ROS:  Please see the history of present illness.   Otherwise, review of systems are positive for none.   All other systems are reviewed and negative.    PHYSICAL EXAM: VS:  BP 130/68   Pulse 69   Ht 5\' 7"  (1.702 m)   Wt 198 lb (89.8 kg)   SpO2 98%   BMI 31.01 kg/m  , BMI Body mass index is 31.01 kg/m. GENERAL:  Well appearing NECK:  No jugular venous distention, waveform within normal limits, carotid upstroke brisk and symmetric, no bruits, no thyromegaly LUNGS:  Clear to auscultation bilaterally CHEST:  Well healed pacemaker pocket HEART:  PMI not displaced or sustained,S1 and S2 within normal limits, no S3, no clicks, no rubs, no murmurs, irregular  ABD:  Flat, positive bowel sounds normal in frequency in pitch, no bruits, no rebound, no guarding, no midline pulsatile mass, no hepatomegaly, no splenomegaly EXT:  2 plus pulses throughout, mild left greater than right leg edema, no cyanosis no clubbing    EKG:  EKG is  ordered today. Atrial fibrillation with ventricular pacing 100% capture  Recent Labs: 03/28/2020: ALT 14; BUN 14; Creat 1.13; Hemoglobin 13.2; Platelets 154; Potassium 3.8; Sodium 141    Lipid Panel    Component Value Date/Time   CHOL 103 09/24/2019 1554   CHOL 109 04/16/2017 0923   TRIG 214.0 (H) 09/24/2019 1554   HDL 32.10 (L) 09/24/2019 1554   HDL 39 (L) 04/16/2017 0923   CHOLHDL 3 09/24/2019 1554   VLDL 42.8 (H) 09/24/2019 1554   LDLCALC 45 07/07/2018 1002   LDLCALC 48 04/16/2017 0923   LDLDIRECT 46.0 09/24/2019 1554      Wt Readings from Last 3 Encounters:  05/09/20 198 lb (89.8 kg)  03/28/20 208 lb (94.3 kg)  09/24/19 200 lb 9.6 oz (91 kg)      Other studies Reviewed: Additional studies/ records that were reviewed today include: Labs. Review of the above records  demonstrates:  Please see elsewhere in the note.     ASSESSMENT AND PLAN:   ATRIAL FIBRILLATION -    [name] has a CHA2DS2 - VASc score of 4.   He tolerates rate control and anticoagulation.  I will check a CBC today.   CAD, NATIVE VESSEL -  He had a stress perfusion study in 2012 .  He has had no new symptoms.  No change in therapy.   PACEMAKER, PERMANENT -  He is up to date with follow up and I reviewed the 04/18/20 interrogation.  DYSLIPIDEMIA - LDL was 46.  No change in therapy.   HTN - His blood pressure is at target.  No change in therapy.   Current medicines are reviewed at length with the patient today.  The patient does not have concerns regarding medicines.  The following changes have been made:  None  Labs/ tests ordered today include: None  Orders Placed This Encounter  Procedures  . EKG 12-Lead     Disposition:   FU with me in me in 12 months.     Signed, Minus Breeding, MD  05/09/2020 12:11 PM    [facility]

## 2020-05-09 ENCOUNTER — Encounter: Payer: Self-pay | Admitting: Cardiology

## 2020-05-09 ENCOUNTER — Other Ambulatory Visit: Payer: Self-pay

## 2020-05-09 ENCOUNTER — Ambulatory Visit: Payer: Medicare HMO | Admitting: Cardiology

## 2020-05-09 ENCOUNTER — Telehealth: Payer: Self-pay

## 2020-05-09 VITALS — BP 130/68 | HR 69 | Ht 67.0 in | Wt 198.0 lb

## 2020-05-09 DIAGNOSIS — I2510 Atherosclerotic heart disease of native coronary artery without angina pectoris: Secondary | ICD-10-CM

## 2020-05-09 DIAGNOSIS — Z950 Presence of cardiac pacemaker: Secondary | ICD-10-CM

## 2020-05-09 DIAGNOSIS — I482 Chronic atrial fibrillation, unspecified: Secondary | ICD-10-CM | POA: Diagnosis not present

## 2020-05-09 DIAGNOSIS — I10 Essential (primary) hypertension: Secondary | ICD-10-CM

## 2020-05-09 DIAGNOSIS — E785 Hyperlipidemia, unspecified: Secondary | ICD-10-CM

## 2020-05-09 MED ORDER — TAMSULOSIN HCL 0.4 MG PO CAPS
0.8000 mg | ORAL_CAPSULE | Freq: Every day | ORAL | 0 refills | Status: DC
Start: 2020-05-09 — End: 2020-07-12

## 2020-05-09 MED ORDER — ATORVASTATIN CALCIUM 40 MG PO TABS: 40.0000 mg | ORAL_TABLET | Freq: Every day | ORAL | 1 refills | Status: DC

## 2020-05-09 NOTE — Patient Instructions (Signed)
Medication Instructions:  No changes *If you need a refill on your cardiac medications before your next appointment, please call your pharmacy*  Lab Work: None ordered this visit If you have labs (blood work) drawn today and your tests are completely normal, you will receive your results only by: Marland Kitchen MyChart Message (if you have MyChart) OR . A paper copy in the mail If you have any lab test that is abnormal or we need to change your treatment, we will call you to review the results.  Testing/Procedures: None ordered this visit  Follow-Up: At [facility], you and your health needs are our priority.  As part of our continuing mission to provide you with exceptional heart care, we have created designated Provider Care Teams.  These Care Teams include your primary Cardiologist (physician) and Advanced Practice Providers (APPs -  Physician Assistants and Nurse Practitioners) who all work together to provide you with the care you need, when you need it.  We recommend signing up for the patient portal called "MyChart".  Sign up information is provided on this After Visit Summary.  MyChart is used to connect with patients for Virtual Visits (Telemedicine).  Patients are able to view lab/test results, encounter notes, upcoming appointments, etc.  Non-urgent messages can be sent to your provider as well.   To learn more about what you can do with MyChart, go to [email or web address].    Your next appointment:   12 month(s)  You will receive a reminder letter in the mail two months in advance. If you don't receive a letter, please call our office to schedule the follow-up appointment.  The format for your next appointment:   In Person  Provider:   Minus Breeding, MD

## 2020-05-09 NOTE — Telephone Encounter (Signed)
Medication has been sent to the patient's pharmacy.  

## 2020-05-09 NOTE — Telephone Encounter (Signed)
MEDICATION: tamsulosin (FLOMAX) 0.4 MG CAPS capsule  atorvastatin (LIPITOR) 40 MG tablet  PHARMACY:  [place], [place] Phone:  [phone]  Fax:  [phone]       Comments:   Will be out in a week!      **Let patient know to contact pharmacy at the end of the day to make sure medication is ready. **  ** Please notify patient to allow 48-72 hours to process**  **Encourage patient to contact the pharmacy for refills or they can request refills through [facility]**

## 2020-05-10 ENCOUNTER — Ambulatory Visit: Payer: Medicare HMO | Admitting: Internal Medicine

## 2020-05-10 VITALS — BP 118/70 | HR 88 | Ht 67.0 in | Wt 207.0 lb

## 2020-05-10 DIAGNOSIS — I480 Paroxysmal atrial fibrillation: Secondary | ICD-10-CM

## 2020-05-10 DIAGNOSIS — Z950 Presence of cardiac pacemaker: Secondary | ICD-10-CM

## 2020-05-10 DIAGNOSIS — I442 Atrioventricular block, complete: Secondary | ICD-10-CM | POA: Diagnosis not present

## 2020-05-10 DIAGNOSIS — I10 Essential (primary) hypertension: Secondary | ICD-10-CM | POA: Diagnosis not present

## 2020-05-10 NOTE — Patient Instructions (Signed)
Medication Instructions:  Your physician recommends that you continue on your current medications as directed. Please refer to the Current Medication list given to you today.  Labwork: None ordered.  Testing/Procedures: None ordered.  Follow-Up: Your physician wants you to follow-up in: one year with Dr. Lovena Le.   You will receive a reminder letter in the mail two months in advance. If you don't receive a letter, please call our office to schedule the follow-up appointment.  Remote monitoring is used to monitor your Pacemaker from home. This monitoring reduces the number of office visits required to check your device to one time per year. It allows [place] to keep an eye on the functioning of your device to ensure it is working properly. You are scheduled for a device check from home on 07/18/2020. You may send your transmission at any time that day. If you have a wireless device, the transmission will be sent automatically. After your physician reviews your transmission, you will receive a postcard with your next transmission date.  Any Other Special Instructions Will Be Listed Below (If Applicable).  If you need a refill on your cardiac medications before your next appointment, please call your pharmacy.

## 2020-05-10 NOTE — Progress Notes (Signed)
HPI [name] returns today for followup of his symptomatic tachy/brady syndrome, s/p PPM insertion. He is an elderly man with a h/o symptomatic sinus node dysfunction and PAF, who underwent PPM change out 5 years ago. In the interim, he has done well. He notes that he is still mowing grass and is quite active.  he denies chest pain or sob.  No Known Allergies   Current Outpatient Medications  Medication Sig Dispense Refill  . atorvastatin (LIPITOR) 40 MG tablet Take 1 tablet (40 mg total) by mouth daily at 6 PM. 90 tablet 1  . diltiazem (CARDIZEM CD) 240 MG 24 hr capsule TAKE 1 CAPSULE EVERY DAY 90 capsule 0  . finasteride (PROSCAR) 5 MG tablet Take 1 tablet (5 mg total) by mouth daily. 90 tablet 2  . fluticasone (FLONASE) 50 MCG/ACT nasal spray USE 2 SPRAYS IN EACH NOSTRIL EVERY DAY 48 g 0  . metoprolol succinate (TOPROL-XL) 50 MG 24 hr tablet Take 1.5 tablets (75 mg total) by mouth daily. Take with or immediately following a meal. 135 tablet 1  . Multiple Vitamins-Minerals (MULTIVITAMIN & MINERAL PO) Take 1 tablet by mouth every other day.     . nitroGLYCERIN (NITROSTAT) 0.4 MG SL tablet Place 1 tablet (0.4 mg total) under the tongue every 5 (five) minutes as needed for chest pain. 25 tablet 1  . tamsulosin (FLOMAX) 0.4 MG CAPS capsule Take 2 capsules (0.8 mg total) by mouth daily. 180 capsule 0  . warfarin (COUMADIN) 2.5 MG tablet Take 1 tablet daily except take 2 tablets on Monday or Take as directed by anticoagulation clinic 120 tablet 1   No current facility-administered medications for this visit.     Past Medical History:  Diagnosis Date  . Actinic keratosis   . Adenomatous colon polyp   . ALLERGIC RHINITIS   . Arthritis   . CAD, NATIVE VESSEL    a. DES x2 to RCA 2004 b. repeat PTCA to RCA 2008 c. PCI to RCA 2012 non-DES   . Cellulitis and abscess of trunk   . Diverticulosis   . Dysrhythmia   . GERD (gastroesophageal reflux disease)   . HYPERLIPIDEMIA   .  HYPERTENSION   . OTH MALIG NEOPLASM SKIN OTH\T\UNSPEC PARTS FACE   . Permanent atrial fibrillation ([place])   . PONV (postoperative nausea and vomiting)   . Presence of permanent cardiac pacemaker   . Skin cancer   . Tachy-brady syndrome ([place])    a. s/p MDT dual chamber pacemaker implant complicated by pericardial effusion and micro-perforatation s/p lead revisions    ROS:   All systems reviewed and negative except as noted in the HPI.   Past Surgical History:  Procedure Laterality Date  . BACK SURGERY  2000   "for bulging discs"  . CATARACT EXTRACTION W/ INTRAOCULAR LENS  IMPLANT, BILATERAL  2012  . CORONARY ANGIOPLASTY WITH STENT PLACEMENT  2008  . CORONARY ANGIOPLASTY WITH STENT PLACEMENT  05/14/11  . CYSTECTOMY     from right side of face.  . EP IMPLANTABLE DEVICE N/A 12/16/2014   Procedure: PPM Generator Changeout;  Surgeon: Evans Lance, MD;  Location: [place] [facility];  Service: Cardiovascular;  Laterality: N/A;  . LEAD REVISION  2008   11 days post initial implant, RA and RV lead revisions for micro-perforation and pericardial effusion by Dr Lovena Le  . LEFT HEART CATHETERIZATION WITH CORONARY ANGIOGRAM N/A 05/14/2011   Procedure: LEFT HEART CATHETERIZATION WITH CORONARY ANGIOGRAM;  Surgeon:  Hillary Bow, MD;  Location: [facility];  Service: Cardiovascular;  Laterality: N/A;  . PACEMAKER INSERTION  2008   MDT dual chamber pacemaker implanted by Dr Percival Spanish  . TOTAL KNEE ARTHROPLASTY Right 08/11/2019   Procedure: TOTAL KNEE ARTHROPLASTY;  Surgeon: Paralee Cancel, MD;  Location: [facility];  Service: Orthopedics;  Laterality: Right;  70 mins     Family History  Problem Relation Age of Onset  . Ovarian cancer Mother   . Heart attack Father   . Colon cancer Father 1       deceased 76  . Diabetes Brother        x 2  . Heart disease Brother        x 3     Social History   Socioeconomic History  . Marital status: Married    Spouse name: Not on file  . Number of  children: 2  . Years of education: Not on file  . Highest education level: Not on file  Occupational History  . Occupation: retired    Comment: [employer]   Tobacco Use  . Smoking status: Never Smoker  . Smokeless tobacco: Never Used  Vaping Use  . Vaping Use: Never used  Substance and Sexual Activity  . Alcohol use: No    Comment: 05/14/11 "last occasional drink was in 1990's"  . Drug use: No  . Sexual activity: Yes  Other Topics Concern  . Not on file  Social History Narrative   Widowed March 2016. 2 sons. 4 grandkids- all through college. 1 greatgrandchild on way 11/2015.    Lives by himself.       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   Social Determinants of Health   Financial Resource Strain:   . Difficulty of Paying Living Expenses: Not on file  Food Insecurity:   . Worried About Charity fundraiser in the Last Year: Not on file  . Ran Out of Food in the Last Year: Not on file  Transportation Needs:   . Lack of Transportation (Medical): Not on file  . Lack of Transportation (Non-Medical): Not on file  Physical Activity:   . Days of Exercise per Week: Not on file  . Minutes of Exercise per Session: Not on file  Stress:   . Feeling of Stress : Not on file  Social Connections:   . Frequency of Communication with Friends and Family: Not on file  . Frequency of Social Gatherings with Friends and Family: Not on file  . Attends Religious Services: Not on file  . Active Member of Clubs or Organizations: Not on file  . Attends Archivist Meetings: Not on file  . Marital Status: Not on file  Intimate Partner Violence:   . Fear of Current or Ex-Partner: Not on file  . Emotionally Abused: Not on file  . Physically Abused: Not on file  . Sexually Abused: Not on file     BP 118/70   Pulse 88   Ht 5\' 7"  (1.702 m)   Wt 207 lb 0.6  oz (93.9 kg)   SpO2 98%   BMI 32.43 kg/m   Physical Exam:  Well appearing 85 yo man, NAD HEENT: Unremarkable Neck:  6 cm JVD, no thyromegally Lymphatics:  No adenopathy Back:  No CVA tenderness Lungs:  Clear with no wheezes  HEART:  IRegular rate rhythm, no murmurs, no rubs, no clicks Abd:  soft, positive bowel sounds, no organomegally, no rebound, no guarding Ext:  2 plus pulses, no edema, no cyanosis, no clubbing Skin:  No rashes no nodules Neuro:  CN II through XII intact, motor grossly intact   DEVICE  Normal device function.  See PaceArt for details.   Assess/Plan: 1. Chronic atrial fib - his VR is well controlled. No change in his meds. 2. PPM - his medtronic single chamber PPM is working normally and he is pacing about  3. Obesity - I encouraged him to lose more weight. 4. Sinus node dysfunction - he is asymptomatic now that he is in atrial fib.  Carleene Overlie Jayziah Bankhead,MD

## 2020-05-11 DIAGNOSIS — H04123 Dry eye syndrome of bilateral lacrimal glands: Secondary | ICD-10-CM | POA: Diagnosis not present

## 2020-05-11 DIAGNOSIS — H401112 Primary open-angle glaucoma, right eye, moderate stage: Secondary | ICD-10-CM | POA: Diagnosis not present

## 2020-05-11 DIAGNOSIS — Z961 Presence of intraocular lens: Secondary | ICD-10-CM | POA: Diagnosis not present

## 2020-05-16 ENCOUNTER — Telehealth: Payer: Self-pay

## 2020-05-16 NOTE — Progress Notes (Unsigned)
    Chronic Care Management Pharmacy Assistant   Name: Shawn [name]  MRN: [id number] DOB: 04-08-1935  Reason for Encounter: Medication Review   Shawn [name],  85 y.o. , male presents for their Initial CCM visit with the clinical pharmacist via telephone.  PCP : Marin Olp, MD  Allergies:  No Known Allergies  Medications: Outpatient Encounter Medications as of 05/16/2020  Medication Sig  . atorvastatin (LIPITOR) 40 MG tablet Take 1 tablet (40 mg total) by mouth daily at 6 PM.  . diltiazem (CARDIZEM CD) 240 MG 24 hr capsule TAKE 1 CAPSULE EVERY DAY  . finasteride (PROSCAR) 5 MG tablet Take 1 tablet (5 mg total) by mouth daily.  . fluticasone (FLONASE) 50 MCG/ACT nasal spray USE 2 SPRAYS IN EACH NOSTRIL EVERY DAY  . metoprolol succinate (TOPROL-XL) 50 MG 24 hr tablet Take 1.5 tablets (75 mg total) by mouth daily. Take with or immediately following a meal.  . Multiple Vitamins-Minerals (MULTIVITAMIN & MINERAL PO) Take 1 tablet by mouth every other day.   . nitroGLYCERIN (NITROSTAT) 0.4 MG SL tablet Place 1 tablet (0.4 mg total) under the tongue every 5 (five) minutes as needed for chest pain.  . tamsulosin (FLOMAX) 0.4 MG CAPS capsule Take 2 capsules (0.8 mg total) by mouth daily.  Marland Kitchen warfarin (COUMADIN) 2.5 MG tablet Take 1 tablet daily except take 2 tablets on Monday or Take as directed by anticoagulation clinic   No facility-administered encounter medications on file as of 05/16/2020.    Current Diagnosis: Patient Active Problem List   Diagnosis Date Noted  . Benign paroxysmal positional vertigo of right ear 09/24/2019  . Obese 08/12/2019  . Status post total right knee replacement 08/11/2019  . Complete heart block ([place]) 04/02/2019  . Aortic atherosclerosis ([place]) 10/28/2018  . Benign microscopic hematuria 10/02/2018  . Nocturia 10/02/2018  . Long term (current) use of anticoagulants 02/18/2017  . History of adenomatous polyp of colon 09/29/2014  . BPH  associated with nocturia 09/09/2014  . Encounter for therapeutic drug monitoring 09/24/2013  . History of thrombocytopenia 06/17/2011  . CAD, NATIVE VESSEL 09/27/2008  . Hx of skin cancer, basal cell 04/07/2008  . Actinic keratosis 04/07/2008  . Hyperlipidemia 03/10/2008  . PACEMAKER, PERMANENT 03/11/2007  . Atrial fibrillation ([place]) 02/11/2007  . Essential hypertension 12/02/2006  . Allergic rhinitis 12/02/2006  . GERD 12/02/2006    Have you seen any other providers since your last visit? **{YES NO:22349}  Any changes in your medications or health? {YES NO:22349}  Any side effects from any medications? {YES NO:22349}  Do you have an symptoms or problems not managed by your medications? {YES NO:22349}  Any concerns about your health right now? {YES NO:22349}  Has your provider asked that you check blood pressure, blood sugar, or follow special diet at home? {YES NO:22349}  Do you get any type of exercise on a regular basis? {YES NO:22349}  Can you think of a goal you would like to reach for your health?   Do you have any problems getting your medications? {YES NO:22349}  Is there anything that you would like to discuss during the appointment?  Please bring medications and supplements to appointment    Follow-Up:  Pharmacist Review

## 2020-05-16 NOTE — Progress Notes (Deleted)
Chronic Care Management Pharmacy Name: Shawn [name]     MRN: [id number]     DOB: 1935-01-28  Chief Complaint/ HPI Shawn [name], 85 y.o., male, presents for their initial CCM visit with the clinical pharmacist {in office or via telephone:24812::"via telephone due to COVID-19 pandemic"}.  PCP : Marin Olp, MD No diagnosis found.  Office Visits:  03/28/2020 (PCP):  Loose stools 6+ mo, referred to GI. 6 month f/u for physical.   Consult Visit: 05/10/2020 (Dr Lovena Le): chronic afib - his VR is well controlled. PP< - medtronic single chamber working normally. Encouraged to lose more weight. Sinus node dysfunction - asymptomatic now that he is in afib. 05/09/2020 (Dr Percival Spanish): tolerating rate control and AC, no changes for CAD/Pacemaker/dyslipemia/HTN - at goal.   Patient Active Problem List   Diagnosis Date Noted  . Benign paroxysmal positional vertigo of right ear 09/24/2019  . Obese 08/12/2019  . Status post total right knee replacement 08/11/2019  . Complete heart block ([place]) 04/02/2019  . Aortic atherosclerosis ([place]) 10/28/2018  . Benign microscopic hematuria 10/02/2018  . Nocturia 10/02/2018  . Long term (current) use of anticoagulants 02/18/2017  . History of adenomatous polyp of colon 09/29/2014  . BPH associated with nocturia 09/09/2014  . Encounter for therapeutic drug monitoring 09/24/2013  . History of thrombocytopenia 06/17/2011  . CAD, NATIVE VESSEL 09/27/2008  . Hx of skin cancer, basal cell 04/07/2008  . Actinic keratosis 04/07/2008  . Hyperlipidemia 03/10/2008  . PACEMAKER, PERMANENT 03/11/2007  . Atrial fibrillation ([place]) 02/11/2007  . Essential hypertension 12/02/2006  . Allergic rhinitis 12/02/2006  . GERD 12/02/2006   Past Surgical History:  Procedure Laterality Date  . BACK SURGERY  2000   "for bulging discs"  . CATARACT EXTRACTION W/ INTRAOCULAR LENS  IMPLANT, BILATERAL  2012  . CORONARY ANGIOPLASTY WITH STENT PLACEMENT  2008  . CORONARY  ANGIOPLASTY WITH STENT PLACEMENT  05/14/11  . CYSTECTOMY     from right side of face.  . EP IMPLANTABLE DEVICE N/A 12/16/2014   Procedure: PPM Generator Changeout;  Surgeon: Evans Lance, MD;  Location: [place] [facility];  Service: Cardiovascular;  Laterality: N/A;  . LEAD REVISION  2008   11 days post initial implant, RA and RV lead revisions for micro-perforation and pericardial effusion by Dr Lovena Le  . LEFT HEART CATHETERIZATION WITH CORONARY ANGIOGRAM N/A 05/14/2011   Procedure: LEFT HEART CATHETERIZATION WITH CORONARY ANGIOGRAM;  Surgeon: Hillary Bow, MD;  Location: [facility] C[facility] LAB;  Service: Cardiovascular;  Laterality: N/A;  . PACEMAKER INSERTION  2008   MDT dual chamber pacemaker implanted by Dr Percival Spanish  . TOTAL KNEE ARTHROPLASTY Right 08/11/2019   Procedure: TOTAL KNEE ARTHROPLASTY;  Surgeon: Paralee Cancel, MD;  Location: [facility];  Service: Orthopedics;  Laterality: Right;  70 mins   Family History  Problem Relation Age of Onset  . Ovarian cancer Mother   . Heart attack Father   . Colon cancer Father 11       deceased 66  . Diabetes Brother        x 2  . Heart disease Brother        x 3   No Known Allergies Outpatient Encounter Medications as of 05/17/2020  Medication Sig  . atorvastatin (LIPITOR) 40 MG tablet Take 1 tablet (40 mg total) by mouth daily at 6 PM.  . diltiazem (CARDIZEM CD) 240 MG 24 hr capsule TAKE 1 CAPSULE EVERY DAY  . finasteride (PROSCAR) 5 MG tablet Take 1  tablet (5 mg total) by mouth daily.  . fluticasone (FLONASE) 50 MCG/ACT nasal spray USE 2 SPRAYS IN EACH NOSTRIL EVERY DAY  . metoprolol succinate (TOPROL-XL) 50 MG 24 hr tablet Take 1.5 tablets (75 mg total) by mouth daily. Take with or immediately following a meal.  . Multiple Vitamins-Minerals (MULTIVITAMIN & MINERAL PO) Take 1 tablet by mouth every other day.   . nitroGLYCERIN (NITROSTAT) 0.4 MG SL tablet Place 1 tablet (0.4 mg total) under the tongue every 5 (five) minutes as needed for chest  pain.  . tamsulosin (FLOMAX) 0.4 MG CAPS capsule Take 2 capsules (0.8 mg total) by mouth daily.  Marland Kitchen warfarin (COUMADIN) 2.5 MG tablet Take 1 tablet daily except take 2 tablets on Monday or Take as directed by anticoagulation clinic   No facility-administered encounter medications on file as of 05/17/2020.   Patient Care Team    Relationship Specialty Notifications Start End  Marin Olp, MD PCP - General Family Medicine  09/23/18    Comment: Beryle Quant, MD PCP - Cardiology Cardiology Admissions 05/04/19   Marin Olp, MD  Family Medicine  09/23/18    Comment: Wyonia Hough, MD Consulting Physician Urology  10/17/18   Karin Golden, MD Consulting Physician Dermatology  07/28/19   Calvert Cantor, MD Consulting Physician Ophthalmology  07/28/19   Paralee Cancel, MD Consulting Physician Orthopedic Surgery  07/28/19   Madelin Rear, [facility] Pharmacist Pharmacist  04/04/20    Comment: [phone]   Current Diagnosis/Assessment: Goals Addressed   None    Hypertension   BP goal <130/80  BP Readings from Last 3 Encounters:  05/10/20 118/70  05/09/20 130/68  03/28/20 (!) 120/56    BMP Latest Ref Rng & Units 03/28/2020 09/24/2019 08/12/2019  Glucose 65 - 99 mg/dL 142(H) 107(H) 208(H)  BUN 7 - 25 mg/dL '14 13 17  ' Creatinine 0.70 - 1.11 mg/dL 1.13(H) 0.95 0.98  BUN/Creat Ratio 6 - 22 (calc) 12 - -  Sodium 135 - 146 mmol/L 141 142 138  Potassium 3.5 - 5.3 mmol/L 3.8 4.3 4.6  Chloride 98 - 110 mmol/L 106 106 107  CO2 20 - 32 mmol/L '26 29 22  ' Calcium 8.6 - 10.3 mg/dL 8.6 8.9 8.5(L)   Previous medications: ***. Patient checks BP at home {CHL HP BP Monitoring Frequency:[phone]}. Recent home readings: ***. Patient is currently at goal on the following medications:  . See afib  ***Diet and exercise discussed - ***  Plan  ***Continue current medications.  Hyperlipidemia   LDL goal < 70  Lipid Panel     Component Value Date/Time   CHOL 103  09/24/2019 1554   CHOL 109 04/16/2017 0923   TRIG 214.0 (H) 09/24/2019 1554   HDL 32.10 (L) 09/24/2019 1554   HDL 39 (L) 04/16/2017 0923   LDLCALC 45 07/07/2018 1002   LDLCALC 48 04/16/2017 0923   LDLDIRECT 46.0 09/24/2019 1554    Hepatic Function Latest Ref Rng & Units 03/28/2020 09/24/2019 07/07/2018  Total Protein 6.1 - 8.1 g/dL 6.3 6.5 6.8  Albumin 3.5 - 5.2 g/dL - 3.9 4.1  AST 10 - 35 U/L '13 15 15  ' ALT 9 - 46 U/L '14 15 22  ' Alk Phosphatase 39 - 117 U/L - 74 64  Total Bilirubin 0.2 - 1.2 mg/dL 0.7 0.4 0.8  Bilirubin, Direct <=0.2 mg/dL - - -    The ASCVD Risk score (Plummer., et al., 2013) failed to calculate for the following reasons:  The 2013 ASCVD risk score is only valid for ages 31 to 17  Previous medications: lower dose atorvastatin 20 mg daily.  Patient is currently at goal the following medications:  . Atorvastatin 40 mg once daily  CAD. Not on asa due to anticoagulation with warfarin.   We discussed diet and exercise extensively.   Plan  Continue current medications.  AFIB   Pulse Readings from Last 3 Encounters:  05/10/20 88  05/09/20 69  03/28/20 68  follwed by Dr. Percival Spanish and Dr. Lovena Le.  Patient is currently rate controlled on the following medications:  . Diltiazem 240 mg once daily . Metoprolol succinate 50 mg tablet - 1.5 tablets (89m) once daily   INR Goal 2.0-3.0 Lab Results  Component Value Date   INR 2.7 05/04/2020   INR 2.5 03/23/2020   INR 2.5 02/10/2020   INR 2.0 12/30/2019   INR 2.2 11/18/2019   Previous medications: xarelto.  ***Prevention of stroke in Afib. CHA2DS2/VAS Stroke Risk Points = 4.Denies any abnormal bruising, bleeding from nose or gums or blood in urine or stool. Patient is currently ***controlled on the following medications:  . Warfarin take 1 tablet daily except take 2 tablets on Monday or Take as directed by anticoagulation clinic  ***Reviewed diet with emphasis on consistency in vitamin K rich foods - handout  provided.  Plan  Continue current medications.  Vaccines   Immunization History  Administered Date(s) Administered  . Fluad Quad(high Dose 65+) 04/01/2019, 03/28/2020  . Influenza Split 03/24/2012  . Influenza Whole 03/10/2008, 03/16/2009, 04/05/2010  . Influenza, High Dose Seasonal PF 04/02/2016, 04/08/2017, 03/19/2018  . Influenza,inj,Quad PF,6+ Mos 03/30/2013, 03/16/2014, 03/11/2015  . PFIZER SARS-COV-2 Vaccination 06/09/2019, 07/15/2019  . Pneumococcal Conjugate-13 09/09/2014  . Pneumococcal Polysaccharide-23 11/05/2011  . Tdap 11/05/2011   Reviewed and discussed patient's vaccination history.    Plan  Recommended patient receive shingrix vaccine in pharmacy.   Medication Management / Care Coordination   Rece[place] medications from:  [id number]: [phone]: [phone] [id number] [place]one: [phone]: [phone]***Denies any issues with current medication management.   Plan  {US Pharmacy PlZDGU:44034}___________________________ SDOH (Social Determinants of Health) assessments performed: Yes.  Future Appointments  Date Time Provider [place]12/14/2021  3:30 PM [facility] CCM PHARMACIST [facility]  06/15/2020 10:00 AM [facility] COUMADIN CLINIC [facility]  07/18/2020  8:05 AM [facility] DEVICE REMOTES [facility]  09/29/2020 10:40 AM Hu[place]in OlpMD [facility]  10/17/2020  8:05 AM [facility] DEVICE REMOTES [facility]  01/16/2021  8:05 AM [facility] DEVICE REMOTES [facility]  04/17/2021  8:05 AM [facility] DEVICE REMOTES [facility]  07/17/2021  8:05 AM [facility] DEVICE REMOTES [facility]   Visit follow-up:  . CPA follow-up: ***. . Marland KitchenPH  follow-up: *** month *** visit.  JaMadelin RearPharm.D., BCGP Clinical Pharmacist [facility] ([phone]

## 2020-05-17 ENCOUNTER — Ambulatory Visit: Payer: Medicare HMO

## 2020-05-17 NOTE — Chronic Care Management (AMB) (Signed)
  Chronic Care Management   Outreach Note   Name: Shawn [name] MRN: [id number] DOB: 05-19-1935  Referred by: Marin Olp, MD Reason for referral: Telephone Appointment with [place] Pharmacist, Madelin Rear.   An unsuccessful telephone outreach was attempted today. The patient was referred to the pharmacist for assistance with care management and care coordination.   Telephone appointment with clinical pharmacist today (05/17/2020) at 330pm. If patient immediately returns call, transfer to [phone]. Otherwise, please provide number below so patient can reschedule visit.   Madelin Rear, Pharm.D., BCGP Clinical Pharmacist [facility] [phone]

## 2020-06-14 ENCOUNTER — Other Ambulatory Visit: Payer: Self-pay | Admitting: Family Medicine

## 2020-06-14 ENCOUNTER — Other Ambulatory Visit: Payer: Self-pay

## 2020-06-15 ENCOUNTER — Ambulatory Visit (INDEPENDENT_AMBULATORY_CARE_PROVIDER_SITE_OTHER): Payer: Medicare HMO | Admitting: General Practice

## 2020-06-15 DIAGNOSIS — I4891 Unspecified atrial fibrillation: Secondary | ICD-10-CM | POA: Diagnosis not present

## 2020-06-15 DIAGNOSIS — Z7901 Long term (current) use of anticoagulants: Secondary | ICD-10-CM

## 2020-06-15 LAB — POCT INR: INR: 2.2 (ref 2.0–3.0)

## 2020-06-15 NOTE — Patient Instructions (Signed)
Pre visit review using our clinic review tool, if applicable. No additional management support is needed unless otherwise documented below in the visit note.  Take 1 tablet daily except 2 tablets on Mondays only. Re-check in 6 weeks.  

## 2020-06-15 NOTE — Progress Notes (Signed)
I have reviewed and agree with note, evaluation, plan.   Nathan Moctezuma, MD  

## 2020-06-24 ENCOUNTER — Other Ambulatory Visit: Payer: Self-pay | Admitting: Family Medicine

## 2020-06-24 DIAGNOSIS — I10 Essential (primary) hypertension: Secondary | ICD-10-CM

## 2020-07-04 ENCOUNTER — Other Ambulatory Visit: Payer: Self-pay | Admitting: Family Medicine

## 2020-07-11 ENCOUNTER — Other Ambulatory Visit: Payer: Self-pay | Admitting: Family Medicine

## 2020-07-18 ENCOUNTER — Ambulatory Visit (INDEPENDENT_AMBULATORY_CARE_PROVIDER_SITE_OTHER): Payer: Medicare HMO

## 2020-07-18 DIAGNOSIS — I442 Atrioventricular block, complete: Secondary | ICD-10-CM

## 2020-07-20 LAB — CUP PACEART REMOTE DEVICE CHECK
Battery Impedance: 1982 Ohm
Battery Remaining Longevity: 37 mo
Battery Voltage: 2.76 V
Brady Statistic RV Percent Paced: 89 %
Date Time Interrogation Session: 20220215125201
Implantable Lead Implant Date: 20081007
Implantable Lead Implant Date: 20081007
Implantable Lead Location: 753859
Implantable Lead Location: 753860
Implantable Lead Model: 5076
Implantable Lead Model: 5076
Implantable Pulse Generator Implant Date: 20160714
Lead Channel Impedance Value: 0 Ohm
Lead Channel Impedance Value: 510 Ohm
Lead Channel Pacing Threshold Amplitude: 0.75 V
Lead Channel Pacing Threshold Pulse Width: 0.4 ms
Lead Channel Setting Pacing Amplitude: 2 V
Lead Channel Setting Pacing Pulse Width: 0.4 ms
Lead Channel Setting Sensing Sensitivity: 1.4 mV

## 2020-07-25 NOTE — Progress Notes (Signed)
Remote pacemaker transmission.   

## 2020-07-27 ENCOUNTER — Other Ambulatory Visit: Payer: Self-pay

## 2020-07-27 ENCOUNTER — Ambulatory Visit (INDEPENDENT_AMBULATORY_CARE_PROVIDER_SITE_OTHER): Payer: Medicare HMO | Admitting: General Practice

## 2020-07-27 DIAGNOSIS — Z7901 Long term (current) use of anticoagulants: Secondary | ICD-10-CM

## 2020-07-27 DIAGNOSIS — I4891 Unspecified atrial fibrillation: Secondary | ICD-10-CM | POA: Diagnosis not present

## 2020-07-27 LAB — POCT INR: INR: 2.2 (ref 2.0–3.0)

## 2020-07-27 NOTE — Progress Notes (Signed)
I have reviewed and agree with note, evaluation, plan.   Stephen Hunter, MD  

## 2020-07-27 NOTE — Patient Instructions (Addendum)
Pre visit review using our clinic review tool, if applicable. No additional management support is needed unless otherwise documented below in the visit note.  Take 1 tablet daily except 2 tablets on Mondays only. Re-check in 6 weeks.

## 2020-08-18 ENCOUNTER — Other Ambulatory Visit: Payer: Self-pay | Admitting: Family Medicine

## 2020-08-29 ENCOUNTER — Ambulatory Visit: Payer: Medicare HMO

## 2020-08-31 ENCOUNTER — Ambulatory Visit: Payer: Medicare HMO | Admitting: Physician Assistant

## 2020-09-07 ENCOUNTER — Ambulatory Visit (INDEPENDENT_AMBULATORY_CARE_PROVIDER_SITE_OTHER): Payer: Medicare HMO | Admitting: General Practice

## 2020-09-07 ENCOUNTER — Other Ambulatory Visit: Payer: Self-pay

## 2020-09-07 DIAGNOSIS — I4891 Unspecified atrial fibrillation: Secondary | ICD-10-CM | POA: Diagnosis not present

## 2020-09-07 DIAGNOSIS — Z7901 Long term (current) use of anticoagulants: Secondary | ICD-10-CM

## 2020-09-07 LAB — POCT INR: INR: 1.9 — AB (ref 2.0–3.0)

## 2020-09-07 NOTE — Progress Notes (Signed)
I have reviewed and agree with note, evaluation, plan.   Jamielynn Wigley, MD  

## 2020-09-07 NOTE — Patient Instructions (Addendum)
Pre visit review using our clinic review tool, if applicable. No additional management support is needed unless otherwise documented below in the visit note.  Take 1 tablet today and then continue to take 1 tablet daily except 2 tablets on Mondays only. Re-check in 6 weeks.

## 2020-09-12 DIAGNOSIS — H04123 Dry eye syndrome of bilateral lacrimal glands: Secondary | ICD-10-CM | POA: Diagnosis not present

## 2020-09-12 DIAGNOSIS — H401112 Primary open-angle glaucoma, right eye, moderate stage: Secondary | ICD-10-CM | POA: Diagnosis not present

## 2020-09-12 DIAGNOSIS — Z961 Presence of intraocular lens: Secondary | ICD-10-CM | POA: Diagnosis not present

## 2020-09-12 DIAGNOSIS — Z96651 Presence of right artificial knee joint: Secondary | ICD-10-CM | POA: Diagnosis not present

## 2020-09-16 ENCOUNTER — Other Ambulatory Visit: Payer: Self-pay | Admitting: Family Medicine

## 2020-09-27 NOTE — Patient Instructions (Addendum)
Please stop by lab before you go If you have mychart- we will send your results within 3 business days of [place] receiving them.  If you do not have mychart- we will call you about results within 5 business days of [place] receiving them.  *please also note that you will see labs on mychart as soon as they post. I will later go in and write notes on them- will say "notes from Dr. Yong Channel"  Health Maintenance Due  Topic Date Due  . COVID-19 Vaccine (3 - Pfizer risk 4-dose series) Will call back with dates  08/12/2019   Check with pharmacist about prevnar 20 or if you want [place] to give it double check with your insurance  Please check with your pharmacy to see if they have the shingrix vaccine. If they do- please get this immunization and update [place] by phone call or mychart with dates you receive the vaccine   You are eligible to schedule your annual wellness visit with our nurse specialist Otila Kluver (covered by insurance and often can be done virtually or by phone at present).  Please consider scheduling this before you leave today  Refilled flonase to mail order  We will call you within two weeks about your referral to sports medicine for left clavicle/shoulder pain. If you do not hear within 2 weeks, give [place] a call.     Recommended follow up: Return in about 6 months (around 03/31/2021) for follow up- or sooner if needed.

## 2020-09-27 NOTE — Progress Notes (Signed)
Phone: [phone]   Subjective:  Patient presents today for their annual physical. Chief complaint-noted.   See problem oriented charting- ROS- full  review of systems was completed and negative  except for: dental issues, some left clavicle pain- interestingly hurts if he pushes around the temple as well as on the clavicle itself- was improving then cold air made slightly worse- no left neck pain (refer to sports med), eys get slightly red after shower, some darker stools- loose stools responded to probiotic, some slight right sided neck pain- opposite side clavicle pain, seasonal allergies, light headed, bruises easily    The following were reviewed and entered/updated in epic: Past Medical History:  Diagnosis Date  . Actinic keratosis   . Adenomatous colon polyp   . ALLERGIC RHINITIS   . Arthritis   . CAD, NATIVE VESSEL    a. DES x2 to RCA 2004 b. repeat PTCA to RCA 2008 c. PCI to RCA 2012 non-DES   . Cellulitis and abscess of trunk   . Diverticulosis   . Dysrhythmia   . GERD (gastroesophageal reflux disease)   . HYPERLIPIDEMIA   . HYPERTENSION   . OTH MALIG NEOPLASM SKIN OTH\T\UNSPEC PARTS FACE   . Permanent atrial fibrillation ([place])   . PONV (postoperative nausea and vomiting)   . Presence of permanent cardiac pacemaker   . Skin cancer   . Tachy-brady syndrome ([place])    a. s/p MDT dual chamber pacemaker implant complicated by pericardial effusion and micro-perforatation s/p lead revisions   Patient Active Problem List   Diagnosis Date Noted  . Complete heart block ([place]) 04/02/2019    Priority: High  . CAD, NATIVE VESSEL 09/27/2008    Priority: High  . PACEMAKER, PERMANENT 03/11/2007    Priority: High  . Atrial fibrillation ([place]) 02/11/2007    Priority: High  . Benign paroxysmal positional vertigo of right ear 09/24/2019    Priority: Medium  . Status post total right knee replacement 08/11/2019    Priority: Medium  . History of adenomatous polyp of colon  09/29/2014    Priority: Medium  . BPH associated with nocturia 09/09/2014    Priority: Medium  . Hyperlipidemia 03/10/2008    Priority: Medium  . Essential hypertension 12/02/2006    Priority: Medium  . Aortic atherosclerosis ([place]) 10/28/2018    Priority: Low  . Encounter for therapeutic drug monitoring 09/24/2013    Priority: Low  . History of thrombocytopenia 06/17/2011    Priority: Low  . Hx of skin cancer, basal cell 04/07/2008    Priority: Low  . Actinic keratosis 04/07/2008    Priority: Low  . Allergic rhinitis 12/02/2006    Priority: Low  . GERD 12/02/2006    Priority: Low  . Obese 08/12/2019  . Benign microscopic hematuria 10/02/2018  . Nocturia 10/02/2018  . Long term (current) use of anticoagulants 02/18/2017   Past Surgical History:  Procedure Laterality Date  . BACK SURGERY  2000   "for bulging discs"  . CATARACT EXTRACTION W/ INTRAOCULAR LENS  IMPLANT, BILATERAL  2012  . CORONARY ANGIOPLASTY WITH STENT PLACEMENT  2008  . CORONARY ANGIOPLASTY WITH STENT PLACEMENT  05/14/11  . CYSTECTOMY     from right side of face.  . EP IMPLANTABLE DEVICE N/A 12/16/2014   Procedure: PPM Generator Changeout;  Surgeon: Evans Lance, MD;  Location: [facility];  Service: Cardiovascular;  Laterality: N/A;  . LEAD REVISION  2008   11 days post initial implant, RA and RV lead revisions  for micro-perforation and pericardial effusion by Dr Lovena Le  . LEFT HEART CATHETERIZATION WITH CORONARY ANGIOGRAM N/A 05/14/2011   Procedure: LEFT HEART CATHETERIZATION WITH CORONARY ANGIOGRAM;  Surgeon: Hillary Bow, MD;  Location: [facility];  Service: Cardiovascular;  Laterality: N/A;  . PACEMAKER INSERTION  2008   MDT dual chamber pacemaker implanted by Dr Percival Spanish  . TOTAL KNEE ARTHROPLASTY Right 08/11/2019   Procedure: TOTAL KNEE ARTHROPLASTY;  Surgeon: Paralee Cancel, MD;  Location: [facility];  Service: Orthopedics;  Laterality: Right;  70 mins    Family History  Problem Relation  Age of Onset  . Ovarian cancer Mother   . Heart attack Father   . Colon cancer Father 51       deceased 53  . Diabetes Brother        x 2  . Heart disease Brother        x 3    Medications- reviewed and updated Current Outpatient Medications  Medication Sig Dispense Refill  . atorvastatin (LIPITOR) 40 MG tablet Take 1 tablet (40 mg total) by mouth daily at 6 PM. 90 tablet 1  . diltiazem (CARDIZEM CD) 240 MG 24 hr capsule TAKE 1 CAPSULE EVERY DAY 90 capsule 3  . finasteride (PROSCAR) 5 MG tablet TAKE 1 TABLET EVERY DAY 90 tablet 2  . fluticasone (FLONASE) 50 MCG/ACT nasal spray USE 2 SPRAYS IN EACH NOSTRIL EVERY DAY 48 g 0  . metoprolol succinate (TOPROL-XL) 50 MG 24 hr tablet TAKE 1 AND 1/2 TABLETS EVERY DAY. TAKE WITH OR IMMEDIATELY FOLLOWING A MEAL. 135 tablet 1  . Multiple Vitamins-Minerals (MULTIVITAMIN & MINERAL PO) Take 1 tablet by mouth every other day.     . nitroGLYCERIN (NITROSTAT) 0.4 MG SL tablet Place 1 tablet (0.4 mg total) under the tongue every 5 (five) minutes as needed for chest pain. 25 tablet 1  . tamsulosin (FLOMAX) 0.4 MG CAPS capsule TAKE 2 CAPSULES EVERY DAY (NEED MD APPOINTMENT) 180 capsule 0  . warfarin (COUMADIN) 2.5 MG tablet Take 1 tablet daily except take 2 tablets on Monday or Take as directed by anticoagulation clinic 120 tablet 1   No current facility-administered medications for this visit.    Allergies-reviewed and updated No Known Allergies  Social History   Social History Narrative   Widowed March 2016. 2 sons. 4 grandkids- all through college. 1 greatgrandchild on way 11/2015.    Lives by himself.       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   Objective  Objective:  BP 134/72   Pulse 67   Temp 97.9 F (36.6 C) (Temporal)   Ht 5\' 7"  (1.702 m)   Wt 204 lb 6.4 oz (92.7 kg)   SpO2 97%   BMI 32.01 kg/m   Gen: NAD, resting comfortably HEENT: Mucous membranes are moist. Oropharynx normal Neck: no thyromegaly CV: RRR no murmurs rubs or gallops Lungs: CTAB no crackles, wheeze, rhonchi Abdomen: soft/nontender/nondistended/normal bowel sounds. No rebound or guarding.  Ext: 1+ edema Skin: warm, dry Neuro: grossly normal, moves all extremities, blown right pupil    Assessment and Plan  86 y.o. male presenting for annual physical.  Health Maintenance counseling: 1. Anticipatory guidance: Patient counseled regarding regular dental exams -typically q6 months, eye exams - yearly Dr. Bing Plume,  avoiding smoking and second hand smoke , limiting alcohol to 2  beverages per day - does not drink.   2. Risk factor reduction:  Advised patient of need for regular exercise and diet rich and fruits and vegetables to reduce risk of heart attack and stroke. Exercise- staying active in yard- mowing his grass to best of his ability. Diet-up 4 lbs from lats CPE- trying to watch portion sizes.  Wt Readings from Last 3 Encounters:  09/29/20 204 lb 6.4 oz (92.7 kg)  05/10/20 207 lb 0.6 oz (93.9 kg)  05/09/20 198 lb (89.8 kg)  3. Immunizations/screenings/ancillary studies-discussed Prevnar 20 as well as COVID-19 vaccination #3 and 4   Immunization History  Administered Date(s) Administered  . Fluad Quad(high Dose 65+) 04/01/2019, 03/28/2020  . Influenza Split 03/24/2012  . Influenza Whole 03/10/2008, 03/16/2009, 04/05/2010  . Influenza, High Dose Seasonal PF 04/02/2016, 04/08/2017, 03/19/2018  . Influenza,inj,Quad PF,6+ Mos 03/30/2013, 03/16/2014, 03/11/2015  . PFIZER(Purple Top)SARS-COV-2 Vaccination 06/09/2019, 07/15/2019  . Pneumococcal Conjugate-13 09/09/2014  . Pneumococcal Polysaccharide-23 11/05/2011  . Tdap 11/05/2011  4. Prostate cancer screening- past age based screening recommendations  5. Colon cancer screening - #History adenomatous colon polyp 02/17/2016 with 5-year follow-up planned.  We referred to GI  last visit for loose frequent stools and dysphagia- he started probiotic and feeling better. He does not want to follow up with IG 6. Skin cancer screening- has seen derm in past- sounds like at [facility]- declines derm referral for now. advised regular sunscreen use. Denies worrisome, changing, or new skin lesions other than some skin tags  7. never smoker 8. STD screening - dating a friend- will check STD panel for unprotected sex. Always recommend condoms  Status of chronic or acute concerns   #CAD with history of stents-nitroglycerin on hand- no cp or sob #Aortic atherosclerosis #hyperlipidemia S: Medication: Atorvastatin 40Mg , Coumadin (not on aspirin as a result) Lab Results  Component Value Date   CHOL 103 09/24/2019   HDL 32.10 (L) 09/24/2019   LDLCALC 45 07/07/2018   LDLDIRECT 46.0 09/24/2019   TRIG 214.0 (H) 09/24/2019   CHOLHDL 3 09/24/2019    A/P: For CAD-asymptomatic- continue current meds  For aortic atherosclerosis-continue risk factor modification  For hyperlipidemia-target LDL <70 -continue current meds for all the above   # Atrial fibrillation with pacemaker due to history of tachybradycardia and complete heart block syndrome-follows Dr. Lovena Le S: Rate controlled with diltiazem 240 mg extended release, metoprolol 75 mg extended release Anticoagulated with Coumadin A/P: appears stable- continue current meds   #hypertension S: medication: Metoprolol 75 mg extended release, diltiazem 240 mg extended release BP Readings from Last 3 Encounters:  09/29/20 134/72  05/10/20 118/70  05/09/20 130/68  A/P: Stable. Continue current medications.   #BPH S: Medication: Finasteride 5 mg daily, tamsulosin 0.4 mg daily. Nocturia twice a night tolerable- better than before meds  A/P: reasonable control- continue rx   #Allergies/allergic rhinitis S: Medication: Flonase helpful when uses  A/P: needs refill- refilled today to mail order   #Osteoarthritis- doing well  after right knee replacement march 2021  #History of BPPV- right ear in the pastwhich responded well to vestibular rehab- no recurrence  #left clavicle pain- refer to sports medicine- 1 month of pain. Interestingly enough can bother him if he pushes on left side of head and also directly over clavicle  # Left great toe pain at MTP joint since Sunday. Did have gout flare up 7-8 years ago. States got warm and red- had been treated 07/30/11 for gout in past. Topical cream and tylenol helped-  largely normal today- confident this is gout- will monitor Lab Results  Component Value Date   LABURIC 7.0 07/30/2011   Recommended follow up: Return in about 6 months (around 03/31/2021) for follow up- or sooner if needed. Future Appointments  Date Time Provider [place]  10/17/2020  8:05 AM [facility] DEVICE REMOTES [facility]  10/19/2020 10:00 AM [facility]  01/16/2021  8:05 AM [facility] DEVICE REMOTES [facility]  04/17/2021  8:05 AM [facility] DEVICE REMOTES [facility]  07/17/2021  8:05 AM [facility] DEVICE REMOTES [facility]   Lab/Order associations: fasting   ICD-10-CM   1. Preventative health care  Z00.00   2. Essential hypertension  I10   3. Hyperlipidemia, unspecified hyperlipidemia type  E78.5   4. Pain of left clavicle  M89.8X1     No orders of the defined types were placed in this encounter.   Return precautions advised.  Garret Reddish, MD

## 2020-09-29 ENCOUNTER — Other Ambulatory Visit (HOSPITAL_COMMUNITY)
Admission: RE | Admit: 2020-09-29 | Discharge: 2020-09-29 | Disposition: A | Discharge status: Home / Self Care | Payer: Medicare HMO | Source: Ambulatory Visit | Attending: Family Medicine | Admitting: Family Medicine

## 2020-09-29 ENCOUNTER — Telehealth: Payer: Self-pay

## 2020-09-29 ENCOUNTER — Encounter: Payer: Self-pay | Admitting: Family Medicine

## 2020-09-29 ENCOUNTER — Ambulatory Visit (INDEPENDENT_AMBULATORY_CARE_PROVIDER_SITE_OTHER): Payer: Medicare HMO | Admitting: Family Medicine

## 2020-09-29 ENCOUNTER — Other Ambulatory Visit: Payer: Self-pay

## 2020-09-29 VITALS — BP 134/72 | HR 67 | Temp 97.9°F | Ht 67.0 in | Wt 204.4 lb

## 2020-09-29 DIAGNOSIS — Z118 Encounter for screening for other infectious and parasitic diseases: Secondary | ICD-10-CM

## 2020-09-29 DIAGNOSIS — Z0000 Encounter for general adult medical examination without abnormal findings: Secondary | ICD-10-CM | POA: Diagnosis not present

## 2020-09-29 DIAGNOSIS — Z114 Encounter for screening for human immunodeficiency virus [HIV]: Secondary | ICD-10-CM | POA: Diagnosis not present

## 2020-09-29 DIAGNOSIS — E785 Hyperlipidemia, unspecified: Secondary | ICD-10-CM

## 2020-09-29 DIAGNOSIS — I10 Essential (primary) hypertension: Secondary | ICD-10-CM | POA: Diagnosis not present

## 2020-09-29 DIAGNOSIS — Z113 Encounter for screening for infections with a predominantly sexual mode of transmission: Secondary | ICD-10-CM | POA: Insufficient documentation

## 2020-09-29 DIAGNOSIS — M898X1 Other specified disorders of bone, shoulder: Secondary | ICD-10-CM | POA: Diagnosis not present

## 2020-09-29 DIAGNOSIS — Z9189 Other specified personal risk factors, not elsewhere classified: Secondary | ICD-10-CM | POA: Diagnosis not present

## 2020-09-29 LAB — COMPREHENSIVE METABOLIC PANEL
ALT: 14 U/L (ref 0–53)
AST: 14 U/L (ref 0–37)
Albumin: 4.1 g/dL (ref 3.5–5.2)
Alkaline Phosphatase: 77 U/L (ref 39–117)
BUN: 14 mg/dL (ref 6–23)
CO2: 28 mEq/L (ref 19–32)
Calcium: 9.3 mg/dL (ref 8.4–10.5)
Chloride: 104 mEq/L (ref 96–112)
Creatinine, Ser: 1.07 mg/dL (ref 0.40–1.50)
GFR: 64.09 mL/min (ref >60.00)
Glucose, Bld: 106 mg/dL — ABNORMAL HIGH (ref 70–99)
Potassium: 4.1 mEq/L (ref 3.5–5.1)
Sodium: 140 mEq/L (ref 135–145)
Total Bilirubin: 1 mg/dL (ref 0.2–1.2)
Total Protein: 7.2 g/dL (ref 6.0–8.3)

## 2020-09-29 LAB — CBC WITH DIFFERENTIAL/PLATELET
Basophils Absolute: 0.1 10*3/uL (ref 0.0–0.1)
Basophils Relative: 0.7 % (ref 0.0–3.0)
Eosinophils Absolute: 0.4 10*3/uL (ref 0.0–0.7)
Eosinophils Relative: 3.8 % (ref 0.0–5.0)
HCT: 41.5 % (ref 39.0–52.0)
Hemoglobin: 14.2 g/dL (ref 13.0–17.0)
Lymphocytes Relative: 18.1 % (ref 12.0–46.0)
Lymphs Abs: 1.7 10*3/uL (ref 0.7–4.0)
MCHC: 34.1 g/dL (ref 30.0–36.0)
MCV: 90.2 fl (ref 78.0–100.0)
Monocytes Absolute: 0.8 10*3/uL (ref 0.1–1.0)
Monocytes Relative: 8.3 % (ref 3.0–12.0)
Neutro Abs: 6.5 10*3/uL (ref 1.4–7.7)
Neutrophils Relative %: 69.1 % (ref 43.0–77.0)
Platelets: 185 10*3/uL (ref 150.0–400.0)
RBC: 4.6 Mil/uL (ref 4.22–5.81)
RDW: 13.7 % (ref 11.5–15.5)
WBC: 9.4 10*3/uL (ref 4.0–10.5)

## 2020-09-29 LAB — LIPID PANEL
Cholesterol: 89 mg/dL (ref 0–200)
HDL: 35.3 mg/dL — ABNORMAL LOW (ref >39.00)
LDL Cholesterol: 37 mg/dL (ref 0–99)
NonHDL: 54.06
Total CHOL/HDL Ratio: 3
Triglycerides: 83 mg/dL (ref 0.0–149.0)
VLDL: 16.6 mg/dL (ref 0.0–40.0)

## 2020-09-29 MED ORDER — FLUTICASONE PROPIONATE 50 MCG/ACT NA SUSP: 2.0000 | Freq: Every day | NASAL | 3 refills | Status: DC

## 2020-09-29 NOTE — Addendum Note (Signed)
Addended by: Brandy Hale on: 09/29/2020 11:35 AM   Modules accepted: Orders

## 2020-09-29 NOTE — Telephone Encounter (Signed)
Pt called stating he got his [place] booster on 04/22/2020.

## 2020-09-29 NOTE — Telephone Encounter (Signed)
Noted, added to shot record.

## 2020-09-30 LAB — URINE CYTOLOGY ANCILLARY ONLY
Chlamydia: NEGATIVE
Comment: NEGATIVE
Comment: NEGATIVE
Comment: NORMAL
Neisseria Gonorrhea: NEGATIVE
Trichomonas: NEGATIVE

## 2020-09-30 LAB — RPR: RPR Ser Ql: NONREACTIVE

## 2020-09-30 LAB — HIV ANTIBODY (ROUTINE TESTING W REFLEX): HIV 1&2 Ab, 4th Generation: NONREACTIVE

## 2020-10-03 NOTE — Progress Notes (Signed)
Subjective:   I, Shawn [name], am serving as a scribe for Dr. Lynne Leader.  I'm seeing this patient as a consultation for: Dr. Garret Reddish. Note will be routed back to referring provider/PCP.  CC: Left clavicle pain  HPI: Pt is an 86 y/o male c/o L clavicular pain x 1 month. NWG:NFAO Pt locates pain to Left clavicle that radiates up the side of his neck up to his left temple. Patient states that the pain has gotten worse and has started to hurt on the right side of his neck.   No chest pain palpitations or shortness of breath.  Swelling:no UE numbness/tingling: in left hand  UE weakness: no Aggravates: wore at night or when he is sitting. Treatments tried: tylenol, heating pad  Past medical history, Surgical history, Family history, Social history, Allergies, and medications have been entered into the medical record, reviewed.   Review of Systems: No new headache, visual changes, nausea, vomiting, diarrhea, constipation, dizziness, abdominal pain, skin rash, fevers, chills, night sweats, weight loss, swollen lymph nodes, body aches, joint swelling, muscle aches, chest pain, shortness of breath, mood changes, visual or auditory hallucinations.   Objective:    Vitals:   10/04/20 1433  BP: 118/60  Pulse: 80  SpO2: 97%   General: Well Developed, well nourished, and in no acute distress.  Neuro/Psych: Alert and oriented x3, extra-ocular muscles intact, able to move all 4 extremities, sensation grossly intact. Skin: Warm and dry, no rashes noted.  Respiratory: Not using accessory muscles, speaking in full sentences, trachea midline.  Cardiovascular: Pulses palpable, no extremity edema. Abdomen: Does not appear distended. MSK: C-spine normal-appearing Nontender midline. Tender palpation left cervical paraspinal musculature. Decreased cervical motion. Upper extremity strength reflexes and sensation are intact and equal bilaterally.  Left shoulder  normal-appearing Nontender.  Normal shoulder motion.  Normal strength negative impingement testing.  Lab and Radiology Results  X-ray images C-spine and left clavicle obtained today personally and independently interpreted  C-spine: Significant DDD C5-6.  No acute fractures.  Left clavicle: Pacemaker visible left chest.  AC DJD present.  No acute abnormality present left lung field.  Await formal radiology review   Impression and Recommendations:    Assessment and Plan: 86 y.o. male with neck pain primarily located left side of neck and under clavicle.  Pain mostly due to cervical paraspinal muscles dysfunction.  Certainly does have some degenerative component.  Plan for physical therapy and heating pad.  Discussed medications likely will be less helpful than harmful.  Recheck back in about a month.  Return sooner if needed.Marland Kitchen  PDMP not reviewed this encounter. Orders Placed This Encounter  Procedures  . DG Cervical Spine 2 or 3 views    Standing Status:   Future    Number of Occurrences:   1    Standing Expiration Date:   10/04/2021    Order Specific Question:   Reason for Exam (SYMPTOM  OR DIAGNOSIS REQUIRED)    Answer:   eval neck pain    Order Specific Question:   Preferred imaging location?    Answer:   [facility]  . DG Clavicle Left    Standing Status:   Future    Number of Occurrences:   1    Standing Expiration Date:   10/04/2021    Order Specific Question:   Reason for Exam (SYMPTOM  OR DIAGNOSIS REQUIRED)    Answer:   eval left clav pain    Order Specific Question:  Preferred imaging location?    Answer:   [facility]  . Ambulatory referral to Physical Therapy    Referral Priority:   Routine    Referral Type:   Physical Medicine    Referral Reason:   Specialty Services Required    Requested Specialty:   Physical Therapy    Number of Visits Requested:   1   No orders of the defined types were placed in this encounter.   Discussed warning signs  or symptoms. Please see discharge instructions. Patient expresses understanding.   The above documentation has been reviewed and is accurate and complete Lynne Leader, M.D.

## 2020-10-04 ENCOUNTER — Encounter: Payer: Self-pay | Admitting: Family Medicine

## 2020-10-04 ENCOUNTER — Ambulatory Visit: Payer: Medicare HMO | Admitting: Family Medicine

## 2020-10-04 ENCOUNTER — Other Ambulatory Visit: Payer: Self-pay

## 2020-10-04 ENCOUNTER — Ambulatory Visit (INDEPENDENT_AMBULATORY_CARE_PROVIDER_SITE_OTHER): Payer: Medicare HMO

## 2020-10-04 VITALS — BP 118/60 | HR 80 | Ht 67.0 in | Wt 203.0 lb

## 2020-10-04 DIAGNOSIS — M898X1 Other specified disorders of bone, shoulder: Secondary | ICD-10-CM | POA: Diagnosis not present

## 2020-10-04 DIAGNOSIS — M50323 Other cervical disc degeneration at C6-C7 level: Secondary | ICD-10-CM | POA: Diagnosis not present

## 2020-10-04 DIAGNOSIS — M542 Cervicalgia: Secondary | ICD-10-CM

## 2020-10-04 DIAGNOSIS — M47812 Spondylosis without myelopathy or radiculopathy, cervical region: Secondary | ICD-10-CM | POA: Diagnosis not present

## 2020-10-04 DIAGNOSIS — M50322 Other cervical disc degeneration at C5-C6 level: Secondary | ICD-10-CM | POA: Diagnosis not present

## 2020-10-04 DIAGNOSIS — M19012 Primary osteoarthritis, left shoulder: Secondary | ICD-10-CM | POA: Diagnosis not present

## 2020-10-04 NOTE — Patient Instructions (Addendum)
Thank you for coming in today.  Please get an Xray today before you leave  I've referred you to Physical Therapy.  Let us know if you don't hear from them in one week.  Use heating pad.   Recheck in about 1 month.   Keep me updated and let me sooner if you are having a problem.

## 2020-10-07 ENCOUNTER — Ambulatory Visit (INDEPENDENT_AMBULATORY_CARE_PROVIDER_SITE_OTHER): Payer: Medicare HMO

## 2020-10-07 DIAGNOSIS — Z0000 Encounter for general adult medical examination without abnormal findings: Secondary | ICD-10-CM | POA: Diagnosis not present

## 2020-10-07 NOTE — Progress Notes (Signed)
Left collarbone x-ray shows a little bit of arthritis of the small joint at the top of the shoulder but otherwise looks okay.  The pacemaker on the left side looks okay as well.

## 2020-10-07 NOTE — Patient Instructions (Addendum)
[name] , Thank you for taking time to come for your Medicare Wellness Visit. I appreciate your ongoing commitment to your health goals. Please review the following plan we discussed and let me know if I can assist you in the future.   Screening recommendations/referrals: Colonoscopy: Done 02/17/16 Recommended yearly ophthalmology/optometry visit for glaucoma screening and checkup Recommended yearly dental visit for hygiene and checkup  Vaccinations: Influenza vaccine: Up to date Pneumococcal vaccine: Up to date Tdap vaccine: Up to date Shingles vaccine: Shingrix discussed. Please contact your pharmacy for coverage information.    Covid-19: Completed 1/5, 2/10, & 04/22/20  Advanced directives: Please bring a copy of your health care power of attorney and living will to the office at your convenience.  Conditions/risks identified: None at this time   Next appointment: Follow up in one year for your annual wellness visit.   Preventive Care 83 Years and Older, Male Preventive care refers to lifestyle choices and visits with your health care provider that can promote health and wellness. What does preventive care include?  A yearly physical exam. This is also called an annual well check.  Dental exams once or twice a year.  Routine eye exams. Ask your health care provider how often you should have your eyes checked.  Personal lifestyle choices, including:  Daily care of your teeth and gums.  Regular physical activity.  Eating a healthy diet.  Avoiding tobacco and drug use.  Limiting alcohol use.  Practicing safe sex.  Taking low doses of aspirin every day.  Taking vitamin and mineral supplements as recommended by your health care provider. What happens during an annual well check? The services and screenings done by your health care provider during your annual well check will depend on your age, overall health, lifestyle risk factors, and family history of  disease. Counseling  Your health care provider may ask you questions about your:  Alcohol use.  Tobacco use.  Drug use.  Emotional well-being.  Home and relationship well-being.  Sexual activity.  Eating habits.  History of falls.  Memory and ability to understand (cognition).  Work and work Statistician. Screening  You may have the following tests or measurements:  Height, weight, and BMI.  Blood pressure.  Lipid and cholesterol levels. These may be checked every 5 years, or more frequently if you are over 27 years old.  Skin check.  Lung cancer screening. You may have this screening every year starting at age 83 if you have a 30-pack-year history of smoking and currently smoke or have quit within the past 15 years.  Fecal occult blood test (FOBT) of the stool. You may have this test every year starting at age 39.  Flexible sigmoidoscopy or colonoscopy. You may have a sigmoidoscopy every 5 years or a colonoscopy every 10 years starting at age 37.  Prostate cancer screening. Recommendations will vary depending on your family history and other risks.  Hepatitis C blood test.  Hepatitis B blood test.  Sexually transmitted disease (STD) testing.  Diabetes screening. This is done by checking your blood sugar (glucose) after you have not eaten for a while (fasting). You may have this done every 1-3 years.  Abdominal aortic aneurysm (AAA) screening. You may need this if you are a current or former smoker.  Osteoporosis. You may be screened starting at age 65 if you are at high risk. Talk with your health care provider about your test results, treatment options, and if necessary, the need for more tests. Vaccines  Your health care provider may recommend certain vaccines, such as:  Influenza vaccine. This is recommended every year.  Tetanus, diphtheria, and acellular pertussis (Tdap, Td) vaccine. You may need a Td booster every 10 years.  Zoster vaccine. You may  need this after age 54.  Pneumococcal 13-valent conjugate (PCV13) vaccine. One dose is recommended after age 45.  Pneumococcal polysaccharide (PPSV23) vaccine. One dose is recommended after age 101. Talk to your health care provider about which screenings and vaccines you need and how often you need them. This information is not intended to replace advice given to you by your health care provider. Make sure you discuss any questions you have with your health care provider. Document Released: 06/17/2015 Document Revised: 02/08/2016 Document Reviewed: 03/22/2015 Elsevier Interactive Patient Education  2017 [place] Prevention in the Home Falls can cause injuries. They can happen to people of all ages. There are many things you can do to make your home safe and to help prevent falls. What can I do on the outside of my home?  Regularly fix the edges of walkways and driveways and fix any cracks.  Remove anything that might make you trip as you walk through a door, such as a raised step or threshold.  Trim any bushes or trees on the path to your home.  Use bright outdoor lighting.  Clear any walking paths of anything that might make someone trip, such as rocks or tools.  Regularly check to see if handrails are loose or broken. Make sure that both sides of any steps have handrails.  Any raised decks and porches should have guardrails on the edges.  Have any leaves, snow, or ice cleared regularly.  Use sand or salt on walking paths during winter.  Clean up any spills in your garage right away. This includes oil or grease spills. What can I do in the bathroom?  Use night lights.  Install grab bars by the toilet and in the tub and shower. Do not use towel bars as grab bars.  Use non-skid mats or decals in the tub or shower.  If you need to sit down in the shower, use a plastic, non-slip stool.  Keep the floor dry. Clean up any water that spills on the floor as soon as it  happens.  Remove soap buildup in the tub or shower regularly.  Attach bath mats securely with double-sided non-slip rug tape.  Do not have throw rugs and other things on the floor that can make you trip. What can I do in the bedroom?  Use night lights.  Make sure that you have a light by your bed that is easy to reach.  Do not use any sheets or blankets that are too big for your bed. They should not hang down onto the floor.  Have a firm chair that has side arms. You can use this for support while you get dressed.  Do not have throw rugs and other things on the floor that can make you trip. What can I do in the kitchen?  Clean up any spills right away.  Avoid walking on wet floors.  Keep items that you use a lot in easy-to-reach places.  If you need to reach something above you, use a strong step stool that has a grab bar.  Keep electrical cords out of the way.  Do not use floor polish or wax that makes floors slippery. If you must use wax, use non-skid floor wax.  Do  not have throw rugs and other things on the floor that can make you trip. What can I do with my stairs?  Do not leave any items on the stairs.  Make sure that there are handrails on both sides of the stairs and use them. Fix handrails that are broken or loose. Make sure that handrails are as long as the stairways.  Check any carpeting to make sure that it is firmly attached to the stairs. Fix any carpet that is loose or worn.  Avoid having throw rugs at the top or bottom of the stairs. If you do have throw rugs, attach them to the floor with carpet tape.  Make sure that you have a light switch at the top of the stairs and the bottom of the stairs. If you do not have them, ask someone to add them for you. What else can I do to help prevent falls?  Wear shoes that:  Do not have high heels.  Have rubber bottoms.  Are comfortable and fit you well.  Are closed at the toe. Do not wear sandals.  If you  use a stepladder:  Make sure that it is fully opened. Do not climb a closed stepladder.  Make sure that both sides of the stepladder are locked into place.  Ask someone to hold it for you, if possible.  Clearly mark and make sure that you can see:  Any grab bars or handrails.  First and last steps.  Where the edge of each step is.  Use tools that help you move around (mobility aids) if they are needed. These include:  Canes.  Walkers.  Scooters.  Crutches.  Turn on the lights when you go into a dark area. Replace any light bulbs as soon as they burn out.  Set up your furniture so you have a clear path. Avoid moving your furniture around.  If any of your floors are uneven, fix them.  If there are any pets around you, be aware of where they are.  Review your medicines with your doctor. Some medicines can make you feel dizzy. This can increase your chance of falling. Ask your doctor what other things that you can do to help prevent falls. This information is not intended to replace advice given to you by your health care provider. Make sure you discuss any questions you have with your health care provider. Document Released: 03/17/2009 Document Revised: 10/27/2015 Document Reviewed: 06/25/2014 Elsevier Interactive Patient Education  2017 [employer].

## 2020-10-07 NOTE — Progress Notes (Signed)
X-ray cervical spine shows some arthritis changes

## 2020-10-07 NOTE — Progress Notes (Addendum)
Virtual Visit via Telephone Note  I connected with  Shawn [name] on 10/07/20 at  3:15 PM EDT by telephone and verified that I am speaking with the correct person using two identifiers.  Medicare Annual Wellness visit completed telephonically due to Covid-19 pandemic.   Persons participating in this call: This Health Coach and this patient.   Location: Patient: Home Provider: Office    I discussed the limitations, risks, security and privacy concerns of performing an evaluation and management service by telephone and the availability of in person appointments. The patient expressed understanding and agreed to proceed.  Unable to perform video visit due to video visit attempted and failed and/or patient does not have video capability.   Some vital signs may be absent or patient reported.   Shawn Brace, LPN    Subjective:   Shawn [name] is a 86 y.o. male who presents for Medicare Annual/Subsequent preventive examination.  Review of Systems     Cardiac Risk Factors include: advanced age (>42men, >45 women);hypertension;dyslipidemia;male gender;obesity (BMI >30kg/m2)     Objective:    Today's Vitals   10/07/20 1515  PainSc: 3    There is no height or weight on file to calculate BMI.  Advanced Directives 10/07/2020 08/11/2019 08/05/2019 07/28/2019 02/06/2018 02/17/2016 05/14/2011  Does Patient Have a Medical Advance Directive? Yes Yes Yes Yes Yes Yes Patient would not like information  Type of Advance Directive Healthcare Power of [place];Living will [place];Living will Living will;Healthcare Power of [place];Living will [place];Living will -  Does patient want to make changes to medical advance directive? - No - Patient declined No - Patient declined No - Patient declined No - Patient declined - -  Copy of [place] in Chart? No - copy requested No - copy  requested No - copy requested No - copy requested No - copy requested Yes -  Pre-existing out of facility DNR order (yellow form or pink MOST form) - - - - - - -    Current Medications (verified) Outpatient Encounter Medications as of 10/07/2020  Medication Sig  . atorvastatin (LIPITOR) 40 MG tablet Take 1 tablet (40 mg total) by mouth daily at 6 PM.  . diltiazem (CARDIZEM CD) 240 MG 24 hr capsule TAKE 1 CAPSULE EVERY DAY  . finasteride (PROSCAR) 5 MG tablet TAKE 1 TABLET EVERY DAY  . fluticasone (FLONASE) 50 MCG/ACT nasal spray Place 2 sprays into both nostrils daily.  . metoprolol succinate (TOPROL-XL) 50 MG 24 hr tablet TAKE 1 AND 1/2 TABLETS EVERY DAY. TAKE WITH OR IMMEDIATELY FOLLOWING A MEAL.  . Multiple Vitamins-Minerals (MULTIVITAMIN & MINERAL PO) Take 1 tablet by mouth every other day.   . tamsulosin (FLOMAX) 0.4 MG CAPS capsule TAKE 2 CAPSULES EVERY DAY (NEED MD APPOINTMENT)  . warfarin (COUMADIN) 2.5 MG tablet Take 1 tablet daily except take 2 tablets on Monday or Take as directed by anticoagulation clinic  . nitroGLYCERIN (NITROSTAT) 0.4 MG SL tablet Place 1 tablet (0.4 mg total) under the tongue every 5 (five) minutes as needed for chest pain. (Patient not taking: Reported on 10/07/2020)   No facility-administered encounter medications on file as of 10/07/2020.    Allergies (verified) Patient has no known allergies.   History: Past Medical History:  Diagnosis Date  . Actinic keratosis   . Adenomatous colon polyp   . ALLERGIC RHINITIS   . Arthritis   . CAD, NATIVE  VESSEL    a. DES x2 to RCA 2004 b. repeat PTCA to RCA 2008 c. PCI to RCA 2012 non-DES   . Cellulitis and abscess of trunk   . Diverticulosis   . Dysrhythmia   . GERD (gastroesophageal reflux disease)   . HYPERLIPIDEMIA   . HYPERTENSION   . OTH MALIG NEOPLASM SKIN OTH\T\UNSPEC PARTS FACE   . Permanent atrial fibrillation ([place])   . PONV (postoperative nausea and vomiting)   . Presence of permanent cardiac  pacemaker   . Skin cancer   . Tachy-brady syndrome ([place])    a. s/p MDT dual chamber pacemaker implant complicated by pericardial effusion and micro-perforatation s/p lead revisions   Past Surgical History:  Procedure Laterality Date  . BACK SURGERY  2000   "for bulging discs"  . CATARACT EXTRACTION W/ INTRAOCULAR LENS  IMPLANT, BILATERAL  2012  . CORONARY ANGIOPLASTY WITH STENT PLACEMENT  2008  . CORONARY ANGIOPLASTY WITH STENT PLACEMENT  05/14/11  . CYSTECTOMY     from right side of face.  . EP IMPLANTABLE DEVICE N/A 12/16/2014   Procedure: PPM Generator Changeout;  Surgeon: Evans Lance, MD;  Location: [facility];  Service: Cardiovascular;  Laterality: N/A;  . LEAD REVISION  2008   11 days post initial implant, RA and RV lead revisions for micro-perforation and pericardial effusion by Dr Lovena Le  . LEFT HEART CATHETERIZATION WITH CORONARY ANGIOGRAM N/A 05/14/2011   Procedure: LEFT HEART CATHETERIZATION WITH CORONARY ANGIOGRAM;  Surgeon: Hillary Bow, MD;  Location: [facility];  Service: Cardiovascular;  Laterality: N/A;  . PACEMAKER INSERTION  2008   MDT dual chamber pacemaker implanted by Dr Percival Spanish  . TOTAL KNEE ARTHROPLASTY Right 08/11/2019   Procedure: TOTAL KNEE ARTHROPLASTY;  Surgeon: Paralee Cancel, MD;  Location: [facility];  Service: Orthopedics;  Laterality: Right;  70 mins   Family History  Problem Relation Age of Onset  . Ovarian cancer Mother   . Heart attack Father   . Colon cancer Father 27       deceased 68  . Diabetes Brother        x 2  . Heart disease Brother        x 3   Social History   Socioeconomic History  . Marital status: Married    Spouse name: Not on file  . Number of children: 2  . Years of education: Not on file  . Highest education level: Not on file  Occupational History  . Occupation: retired    Comment: [employer]   Tobacco Use  . Smoking status: Never Smoker  . Smokeless tobacco: Never Used  Vaping Use  . Vaping Use: Never  used  Substance and Sexual Activity  . Alcohol use: No    Comment: 05/14/11 "last occasional drink was in 1990's"  . Drug use: No  . Sexual activity: Yes  Other Topics Concern  . Not on file  Social History Narrative   Widowed March 2016. 2 sons. 4 grandkids- all through college. 1 greatgrandchild on way 11/2015.    Lives by himself.       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   Social Determinants of Health   Financial Resource Strain: Low Risk   . Difficulty of Paying Living Expenses: Not hard at all  Food Insecurity: No Food Insecurity  .  Worried About Charity fundraiser in the Last Year: Never true  . Ran Out of Food in the Last Year: Never true  Transportation Needs: No Transportation Needs  . Lack of Transportation (Medical): No  . Lack of Transportation (Non-Medical): No  Physical Activity: Inactive  . Days of Exercise per Week: 0 days  . Minutes of Exercise per Session: 0 min  Stress: No Stress Concern Present  . Feeling of Stress : Not at all  Social Connections: Moderately Integrated  . Frequency of Communication with Friends and Family: More than three times a week  . Frequency of Social Gatherings with Friends and Family: More than three times a week  . Attends Religious Services: More than 4 times per year  . Active Member of Clubs or Organizations: Yes  . Attends Archivist Meetings: 1 to 4 times per year  . Marital Status: Widowed    Tobacco Counseling Counseling given: Not Answered   Clinical Intake:  Pre-visit preparation completed: Yes  Pain : 0-10 Pain Score: 3  Pain Type: Chronic pain Pain Location: Neck Pain Descriptors / Indicators: Dull Pain Onset: More than a month ago Pain Frequency: Intermittent     BMI - recorded: 31.79 Nutritional Status: BMI > 30  Obese Nutritional Risks: None Diabetes:  No  How often do you need to have someone help you when you read instructions, pamphlets, or other written materials from your doctor or pharmacy?: 1 - Never  Diabetic?No  Interpreter Needed?: No  Information entered by :: Charlott Rakes, LPN   Activities of Daily Living In your present state of health, do you have any difficulty performing the following activities: 10/07/2020 09/29/2020  Hearing? N N  Vision? N N  Difficulty concentrating or making decisions? Y N  Walking or climbing stairs? N N  Dressing or bathing? N N  Doing errands, shopping? N N  Preparing Food and eating ? N -  Using the Toilet? N -  In the past six months, have you accidently leaked urine? N -  Do you have problems with loss of bowel control? N -  Managing your Medications? N -  Managing your Finances? N -  Housekeeping or managing your Housekeeping? N -  Some recent data might be hidden    Patient Care Team: Marin Olp, MD as PCP - General (Family Medicine) Minus Breeding, MD as PCP - Cardiology (Cardiology) Marin Olp, MD (Family Medicine) Ceasar Mons, MD as Consulting Physician (Urology) Karin Golden, MD as Consulting Physician (Dermatology) Calvert Cantor, MD as Consulting Physician (Ophthalmology) Paralee Cancel, MD as Consulting Physician (Orthopedic Surgery) Madelin Rear, [facility] as Pharmacist (Pharmacist)  Indicate any recent Medical Services you may have received from other than [facility] providers in the past year (date may be approximate).     Assessment:   This is a routine wellness examination for Jayjay.  Hearing/Vision screen  Hearing Screening   125Hz  250Hz  500Hz  1000Hz  2000Hz  3000Hz  4000Hz  6000Hz  8000Hz   Right ear:           Left ear:           Comments: Pt denies any hearing issues   Vision Screening Comments: Pt follows up with Dr @ [facility]   Dietary issues and exercise activities discussed: Current Exercise Habits: The patient does not  participate in regular exercise at present  Goals Addressed            This Visit's Progress   . Patient Stated  None at this time       Depression Screen PHQ 2/9 Scores 10/07/2020 09/29/2020 09/24/2019 07/28/2019 10/28/2018 07/07/2018 02/06/2018  PHQ - 2 Score 0 0 0 0 0 3 0  PHQ- 9 Score - 0 1 - 5 13 0    Fall Risk Fall Risk  10/07/2020 09/29/2020 07/28/2019 10/28/2018 07/07/2018  Falls in the past year? 0 0 0 0 0  Number falls in past yr: 0 0 0 0 0  Injury with Fall? 0 0 0 0 0  Risk for fall due to : Impaired vision - Orthopedic patient - -  Follow up Falls prevention discussed - Education provided;Falls prevention discussed;Falls evaluation completed - -    FALL RISK PREVENTION PERTAINING TO THE HOME:  Any stairs in or around the home? Yes  If so, are there any without handrails? No  Home free of loose throw rugs in walkways, pet beds, electrical cords, etc? Yes  Adequate lighting in your home to reduce risk of falls? Yes   ASSISTIVE DEVICES UTILIZED TO PREVENT FALLS:  Life alert? Yes  Use of a cane, walker or w/c? No  Grab bars in the bathroom? Yes  Shower chair or bench in shower? Yes  Elevated toilet seat or a handicapped toilet? Yes   TIMED UP AND GO:  Was the test performed? No .     Cognitive Function:     6CIT Screen 10/07/2020 07/28/2019  What Year? 0 points 0 points  What month? 0 points 0 points  What time? - 0 points  Count back from 20 0 points 0 points  Months in reverse 0 points 0 points  Repeat phrase 6 points 2 points  Total Score - 2    Immunizations Immunization History  Administered Date(s) Administered  . Fluad Quad(high Dose 65+) 04/01/2019, 03/28/2020  . Influenza Split 03/24/2012  . Influenza Whole 03/10/2008, 03/16/2009, 04/05/2010  . Influenza, High Dose Seasonal PF 04/02/2016, 04/08/2017, 03/19/2018  . Influenza,inj,Quad PF,6+ Mos 03/30/2013, 03/16/2014, 03/11/2015  . PFIZER(Purple Top)SARS-COV-2 Vaccination 06/09/2019, 07/15/2019,  04/22/2020  . Pneumococcal Conjugate-13 09/09/2014  . Pneumococcal Polysaccharide-23 11/05/2011  . Tdap 11/05/2011    TDAP status: Up to date  Flu Vaccine status: Up to date  Pneumococcal vaccine status: Up to date  Covid-19 vaccine status: Completed vaccines  Qualifies for Shingles Vaccine? Yes   Zostavax completed No   Shingrix Completed?: No.    Education has been provided regarding the importance of this vaccine. Patient has been advised to call insurance company to determine out of pocket expense if they have not yet received this vaccine. Advised may also receive vaccine at local pharmacy or Health Dept. Verbalized acceptance and understanding.  Screening Tests Health Maintenance  Topic Date Due  . COVID-19 Vaccine (4 - Booster for Pfizer series) 10/20/2020  . INFLUENZA VACCINE  01/02/2021  . COLONOSCOPY (Pts 45-82yrs Insurance coverage will need to be confirmed)  02/16/2021  . TETANUS/TDAP  11/04/2021  . PNA vac Low Risk Adult  Completed  . HPV VACCINES  Aged Out    Health Maintenance  There are no preventive care reminders to display for this patient.  Colorectal cancer screening: Type of screening: Colonoscopy. Completed 02/17/16. Repeat every 5 years   Additional Screening:  Vision Screening: Recommended annual ophthalmology exams for early detection of glaucoma and other disorders of the eye. Is the patient up to date with their annual eye exam?  Yes  Who is the provider or what is the name of the office in  which the patient attends annual eye exams? [facility]  If pt is not established with a provider, would they like to be referred to a provider to establish care? No .   Dental Screening: Recommended annual dental exams for proper oral hygiene  Community Resource Referral / Chronic Care Management: CRR required this visit?  No   CCM required this visit?  No      Plan:     I have personally reviewed and noted the following in the patient's  chart:   . Medical and social history . Use of alcohol, tobacco or illicit drugs  . Current medications and supplements including opioid prescriptions. Patient is not currently taking opioid prescriptions. . Functional ability and status . Nutritional status . Physical activity . Advanced directives . List of other physicians . Hospitalizations, surgeries, and ER visits in previous 12 months . Vitals . Screenings to include cognitive, depression, and falls . Referrals and appointments  In addition, I have reviewed and discussed with patient certain preventive protocols, quality metrics, and best practice recommendations. A written personalized care plan for preventive services as well as general preventive health recommendations were provided to patient.     Shawn Brace, LPN   06/09/1094   Nurse Notes: None

## 2020-10-17 ENCOUNTER — Ambulatory Visit (INDEPENDENT_AMBULATORY_CARE_PROVIDER_SITE_OTHER): Payer: Medicare HMO

## 2020-10-17 DIAGNOSIS — I442 Atrioventricular block, complete: Secondary | ICD-10-CM | POA: Diagnosis not present

## 2020-10-19 ENCOUNTER — Ambulatory Visit (INDEPENDENT_AMBULATORY_CARE_PROVIDER_SITE_OTHER): Payer: Medicare HMO | Admitting: General Practice

## 2020-10-19 ENCOUNTER — Other Ambulatory Visit: Payer: Self-pay

## 2020-10-19 DIAGNOSIS — Z7901 Long term (current) use of anticoagulants: Secondary | ICD-10-CM

## 2020-10-19 DIAGNOSIS — I4891 Unspecified atrial fibrillation: Secondary | ICD-10-CM

## 2020-10-19 LAB — CUP PACEART REMOTE DEVICE CHECK
Battery Impedance: 2070 Ohm
Battery Remaining Longevity: 36 mo
Battery Voltage: 2.75 V
Brady Statistic RV Percent Paced: 87 %
Date Time Interrogation Session: 20220516081324
Implantable Lead Implant Date: 20081007
Implantable Lead Implant Date: 20081007
Implantable Lead Location: 753859
Implantable Lead Location: 753860
Implantable Lead Model: 5076
Implantable Lead Model: 5076
Implantable Pulse Generator Implant Date: 20160714
Lead Channel Impedance Value: 0 Ohm
Lead Channel Impedance Value: 487 Ohm
Lead Channel Pacing Threshold Amplitude: 0.75 V
Lead Channel Pacing Threshold Pulse Width: 0.4 ms
Lead Channel Setting Pacing Amplitude: 2 V
Lead Channel Setting Pacing Pulse Width: 0.4 ms
Lead Channel Setting Sensing Sensitivity: 1.4 mV

## 2020-10-19 LAB — POCT INR: INR: 2.6 (ref 2.0–3.0)

## 2020-10-19 NOTE — Progress Notes (Signed)
I have reviewed and agree with note, evaluation, plan.   Dezzie Badilla, MD  

## 2020-10-19 NOTE — Patient Instructions (Signed)
Pre visit review using our clinic review tool, if applicable. No additional management support is needed unless otherwise documented below in the visit note.  Continue to take 1 tablet daily except 2 tablets on Mondays only. Re-check in 6 weeks.

## 2020-10-21 ENCOUNTER — Other Ambulatory Visit: Payer: Self-pay | Admitting: Family Medicine

## 2020-10-21 DIAGNOSIS — E785 Hyperlipidemia, unspecified: Secondary | ICD-10-CM

## 2020-10-27 NOTE — Progress Notes (Deleted)
   I, Peterson Lombard, LAT, ATC acting as a scribe for Lynne Leader, MD.  GAVYNN DUVALL is a 86 y.o. male who presents to [place] [facility] today for f/u L-sided neck pain and deep to L clavicle. Pt was last seen by Dr. Georgina Snell on 10/04/20 and was advised to use a heating pad and was referred to PT of which he's completed # visits. Today, pt reports   Dx imaging: 10/04/20 L clavicle & C-spine XR  Pertinent review of systems: ***  Relevant historical information: ***   Exam:  There were no vitals taken for this visit. General: Well Developed, well nourished, and in no acute distress.   MSK: ***    Lab and Radiology Results No results found for this or any previous visit (from the past 72 hour(s)). No results found.     Assessment and Plan: 86 y.o. male with ***   PDMP not reviewed this encounter. No orders of the defined types were placed in this encounter.  No orders of the defined types were placed in this encounter.    Discussed warning signs or symptoms. Please see discharge instructions. Patient expresses understanding.   ***

## 2020-11-01 ENCOUNTER — Ambulatory Visit: Payer: Medicare HMO | Admitting: Family Medicine

## 2020-11-09 NOTE — Progress Notes (Signed)
Remote pacemaker transmission.   

## 2020-11-30 ENCOUNTER — Other Ambulatory Visit: Payer: Self-pay

## 2020-11-30 ENCOUNTER — Ambulatory Visit (INDEPENDENT_AMBULATORY_CARE_PROVIDER_SITE_OTHER): Payer: Medicare HMO | Admitting: General Practice

## 2020-11-30 DIAGNOSIS — I4891 Unspecified atrial fibrillation: Secondary | ICD-10-CM | POA: Diagnosis not present

## 2020-11-30 DIAGNOSIS — Z7901 Long term (current) use of anticoagulants: Secondary | ICD-10-CM

## 2020-11-30 LAB — POCT INR: INR: 2.6 (ref 2.0–3.0)

## 2020-11-30 NOTE — Patient Instructions (Addendum)
Pre visit review using our clinic review tool, if applicable. No additional management support is needed unless otherwise documented below in the visit note.  Continue to take 1 tablet daily except 2 tablets on Mondays only. Re-check in 6 weeks.

## 2020-11-30 NOTE — Progress Notes (Signed)
I have reviewed and agree with note, evaluation, plan.   Landrey Mahurin, MD  

## 2020-12-14 ENCOUNTER — Other Ambulatory Visit: Payer: Self-pay | Admitting: Family Medicine

## 2020-12-14 DIAGNOSIS — I10 Essential (primary) hypertension: Secondary | ICD-10-CM

## 2021-01-04 ENCOUNTER — Ambulatory Visit (INDEPENDENT_AMBULATORY_CARE_PROVIDER_SITE_OTHER): Payer: Medicare HMO | Admitting: General Practice

## 2021-01-04 ENCOUNTER — Other Ambulatory Visit: Payer: Self-pay

## 2021-01-04 DIAGNOSIS — I4891 Unspecified atrial fibrillation: Secondary | ICD-10-CM

## 2021-01-04 DIAGNOSIS — Z7901 Long term (current) use of anticoagulants: Secondary | ICD-10-CM

## 2021-01-04 LAB — POCT INR: INR: 2.7 (ref 2.0–3.0)

## 2021-01-04 NOTE — Progress Notes (Signed)
I have reviewed and agree with note, evaluation, plan.   Verniece Encarnacion, MD  

## 2021-01-04 NOTE — Patient Instructions (Addendum)
Pre visit review using our clinic review tool, if applicable. No additional management support is needed unless otherwise documented below in the visit note.  Continue to take 1 tablet daily except 2 tablets on Mondays only. Re-check in 6 weeks.

## 2021-01-11 ENCOUNTER — Ambulatory Visit: Payer: Medicare HMO

## 2021-01-16 ENCOUNTER — Ambulatory Visit (INDEPENDENT_AMBULATORY_CARE_PROVIDER_SITE_OTHER): Payer: Medicare HMO

## 2021-01-16 DIAGNOSIS — I442 Atrioventricular block, complete: Secondary | ICD-10-CM | POA: Diagnosis not present

## 2021-01-17 LAB — CUP PACEART REMOTE DEVICE CHECK
Battery Impedance: 2310 Ohm
Battery Remaining Longevity: 32 mo
Battery Voltage: 2.74 V
Brady Statistic RV Percent Paced: 90 %
Date Time Interrogation Session: 20220815091211
Implantable Lead Implant Date: 20081007
Implantable Lead Implant Date: 20081007
Implantable Lead Location: 753859
Implantable Lead Location: 753860
Implantable Lead Model: 5076
Implantable Lead Model: 5076
Implantable Pulse Generator Implant Date: 20160714
Lead Channel Impedance Value: 0 Ohm
Lead Channel Impedance Value: 513 Ohm
Lead Channel Pacing Threshold Amplitude: 0.75 V
Lead Channel Pacing Threshold Pulse Width: 0.4 ms
Lead Channel Setting Pacing Amplitude: 2 V
Lead Channel Setting Pacing Pulse Width: 0.4 ms
Lead Channel Setting Sensing Sensitivity: 1.4 mV

## 2021-01-18 DIAGNOSIS — H401112 Primary open-angle glaucoma, right eye, moderate stage: Secondary | ICD-10-CM | POA: Diagnosis not present

## 2021-01-18 DIAGNOSIS — H04123 Dry eye syndrome of bilateral lacrimal glands: Secondary | ICD-10-CM | POA: Diagnosis not present

## 2021-01-18 DIAGNOSIS — Z961 Presence of intraocular lens: Secondary | ICD-10-CM | POA: Diagnosis not present

## 2021-02-01 ENCOUNTER — Other Ambulatory Visit: Payer: Self-pay | Admitting: Family Medicine

## 2021-02-02 ENCOUNTER — Other Ambulatory Visit: Payer: Self-pay

## 2021-02-02 ENCOUNTER — Encounter (HOSPITAL_BASED_OUTPATIENT_CLINIC_OR_DEPARTMENT_OTHER): Payer: Self-pay | Admitting: Emergency Medicine

## 2021-02-02 ENCOUNTER — Emergency Department (HOSPITAL_BASED_OUTPATIENT_CLINIC_OR_DEPARTMENT_OTHER)
Admission: EM | Admit: 2021-02-02 | Discharge: 2021-02-02 | Disposition: A | Discharge status: Home / Self Care | Payer: Medicare HMO | Attending: Emergency Medicine | Admitting: Emergency Medicine

## 2021-02-02 DIAGNOSIS — Z79899 Other long term (current) drug therapy: Secondary | ICD-10-CM | POA: Diagnosis not present

## 2021-02-02 DIAGNOSIS — I872 Venous insufficiency (chronic) (peripheral): Secondary | ICD-10-CM | POA: Insufficient documentation

## 2021-02-02 DIAGNOSIS — I2510 Atherosclerotic heart disease of native coronary artery without angina pectoris: Secondary | ICD-10-CM | POA: Diagnosis not present

## 2021-02-02 DIAGNOSIS — S8991XA Unspecified injury of right lower leg, initial encounter: Secondary | ICD-10-CM | POA: Diagnosis present

## 2021-02-02 DIAGNOSIS — Z96651 Presence of right artificial knee joint: Secondary | ICD-10-CM | POA: Diagnosis not present

## 2021-02-02 DIAGNOSIS — I4821 Permanent atrial fibrillation: Secondary | ICD-10-CM | POA: Insufficient documentation

## 2021-02-02 DIAGNOSIS — S80811A Abrasion, right lower leg, initial encounter: Secondary | ICD-10-CM | POA: Diagnosis not present

## 2021-02-02 DIAGNOSIS — Z7901 Long term (current) use of anticoagulants: Secondary | ICD-10-CM | POA: Insufficient documentation

## 2021-02-02 DIAGNOSIS — T148XXA Other injury of unspecified body region, initial encounter: Secondary | ICD-10-CM

## 2021-02-02 DIAGNOSIS — I10 Essential (primary) hypertension: Secondary | ICD-10-CM | POA: Insufficient documentation

## 2021-02-02 DIAGNOSIS — X58XXXA Exposure to other specified factors, initial encounter: Secondary | ICD-10-CM | POA: Diagnosis not present

## 2021-02-02 DIAGNOSIS — Z85828 Personal history of other malignant neoplasm of skin: Secondary | ICD-10-CM | POA: Diagnosis not present

## 2021-02-02 DIAGNOSIS — R600 Localized edema: Secondary | ICD-10-CM | POA: Insufficient documentation

## 2021-02-02 DIAGNOSIS — Z950 Presence of cardiac pacemaker: Secondary | ICD-10-CM | POA: Insufficient documentation

## 2021-02-02 NOTE — ED Triage Notes (Signed)
Pt arrives pov with c/o bilateral lower leg pain, weeping noted. Ambulatory to triage, normal gait

## 2021-02-02 NOTE — ED Provider Notes (Signed)
[facility] EMERGENCY DEPARTMENT Provider Note   CSN: [id number] Arrival date & time: 02/02/21  0936    History Chief Complaint  Patient presents with   Leg Pain    Shawn [name] is an 86 y.o. male presenting for evaluation of a leg wound.  Reports he noticed a small pocket of fluid in his right posterior leg 1 month ago. Two weeks ago he tried to drain it with a sterilized needle and expressed small amount of clear fluid and blood. Still has a bump in that area. Also has an abrasion to his right anterior shin which he noticed within the last week. Denies any leg pain. No increased swelling of his legs. No color change or increased redness. No fever/chills. No shortness of breath.  He has an appointment with his PCP in 2 weeks but wanted to be evaluated sooner to ensure there was no infection.  Past Medical History:  Diagnosis Date   Actinic keratosis    Adenomatous colon polyp    ALLERGIC RHINITIS    Arthritis    CAD, NATIVE VESSEL    a. DES x2 to RCA 2004 b. repeat PTCA to RCA 2008 c. PCI to RCA 2012 non-DES    Cellulitis and abscess of trunk    Diverticulosis    Dysrhythmia    GERD (gastroesophageal reflux disease)    HYPERLIPIDEMIA    HYPERTENSION    OTH MALIG NEOPLASM SKIN OTH\T\UNSPEC PARTS FACE    Permanent atrial fibrillation (HCC)    PONV (postoperative nausea and vomiting)    Presence of permanent cardiac pacemaker    Skin cancer    Tachy-brady syndrome ([place])    a. s/p MDT dual chamber pacemaker implant complicated by pericardial effusion and micro-perforatation s/p lead revisions    Patient Active Problem List   Diagnosis Date Noted   Benign paroxysmal positional vertigo of right ear 09/24/2019   Obese 08/12/2019   Status post total right knee replacement 08/11/2019   Complete heart block ([place]) 04/02/2019   Aortic atherosclerosis ([place]) 10/28/2018   Benign microscopic hematuria 10/02/2018   Nocturia 10/02/2018   Long term (current) use of  anticoagulants 02/18/2017   History of adenomatous polyp of colon 09/29/2014   BPH associated with nocturia 09/09/2014   Encounter for therapeutic drug monitoring 09/24/2013   History of thrombocytopenia 06/17/2011   CAD, NATIVE VESSEL 09/27/2008   Hx of skin cancer, basal cell 04/07/2008   Actinic keratosis 04/07/2008   Hyperlipidemia 03/10/2008   PACEMAKER, PERMANENT 03/11/2007   Atrial fibrillation ([place]) 02/11/2007   Essential hypertension 12/02/2006   Allergic rhinitis 12/02/2006   GERD 12/02/2006    Past Surgical History:  Procedure Laterality Date   BACK SURGERY  2000   "for bulging discs"   CATARACT EXTRACTION W/ INTRAOCULAR LENS  IMPLANT, BILATERAL  2012   CORONARY ANGIOPLASTY WITH STENT PLACEMENT  2008   CORONARY ANGIOPLASTY WITH STENT PLACEMENT  05/14/11   CYSTECTOMY     from right side of face.   EP IMPLANTABLE DEVICE N/A 12/16/2014   Procedure: PPM Generator Changeout;  Surgeon: Evans Lance, MD;  Location: [facility];  Service: Cardiovascular;  Laterality: N/A;   LEAD REVISION  2008   11 days post initial implant, RA and RV lead revisions for micro-perforation and pericardial effusion by Dr Lovena Le   LEFT HEART CATHETERIZATION WITH CORONARY ANGIOGRAM N/A 05/14/2011   Procedure: LEFT HEART CATHETERIZATION WITH CORONARY ANGIOGRAM;  Surgeon: Hillary Bow, MD;  Location: [facility] CATH LAB;  Service: Cardiovascular;  Laterality: N/A;   PACEMAKER INSERTION  2008   MDT dual chamber pacemaker implanted by Dr Percival Spanish   TOTAL KNEE ARTHROPLASTY Right 08/11/2019   Procedure: TOTAL KNEE ARTHROPLASTY;  Surgeon: Paralee Cancel, MD;  Location: [facility] ORS;  Service: Orthopedics;  Laterality: Right;  70 mins       Family History  Problem Relation Age of Onset   Ovarian cancer Mother    Heart attack Father    Colon cancer Father 79       deceased 14   Diabetes Brother        x 2   Heart disease Brother        x 3    Social History   Tobacco Use   Smoking status: Never    Smokeless tobacco: Never  Vaping Use   Vaping Use: Never used  Substance Use Topics   Alcohol use: No    Comment: 05/14/11 "last occasional drink was in 1990's"   Drug use: No    Home Medications Prior to Admission medications   Medication Sig Start Date End Date Taking? Authorizing Provider  atorvastatin (LIPITOR) 40 MG tablet TAKE 1 TABLET EVERY DAY AT 6PM 10/24/20   Marin Olp, MD  diltiazem (CARDIZEM CD) 240 MG 24 hr capsule TAKE 1 CAPSULE EVERY DAY 08/18/20   Marin Olp, MD  finasteride (PROSCAR) 5 MG tablet TAKE 1 TABLET EVERY DAY 07/04/20   Marin Olp, MD  fluticasone (FLONASE) 50 MCG/ACT nasal spray Place 2 sprays into both nostrils daily. 09/29/20   Marin Olp, MD  metoprolol succinate (TOPROL-XL) 50 MG 24 hr tablet TAKE 1 AND 1/2 TABLETS EVERY DAY. TAKE WITH OR IMMEDIATELY FOLLOWING A MEAL. 12/15/20   Marin Olp, MD  Multiple Vitamins-Minerals (MULTIVITAMIN & MINERAL PO) Take 1 tablet by mouth every other day.     [provider]  nitroGLYCERIN (NITROSTAT) 0.4 MG SL tablet Place 1 tablet (0.4 mg total) under the tongue every 5 (five) minutes as needed for chest pain. Patient not taking: Reported on 10/07/2020 09/24/19   Marin Olp, MD  tamsulosin (FLOMAX) 0.4 MG CAPS capsule TAKE 2 CAPSULES EVERY DAY (NEED MD APPOINTMENT) 02/01/21   Marin Olp, MD  warfarin (COUMADIN) 2.5 MG tablet Take 1 tablet daily except take 2 tablets on Monday or Take as directed by anticoagulation clinic 02/10/20   Marin Olp, MD    Allergies    Patient has no known allergies.  Review of Systems   Review of Systems  Constitutional:  Negative for chills and fever.  Respiratory:  Negative for shortness of breath.   Musculoskeletal:  Negative for joint swelling and myalgias.       No calf pain  Skin:  Positive for wound. Negative for color change and rash.   Physical Exam Updated Vital Signs BP (!) 143/59 (BP Location: Right Arm)   Pulse 65    Temp 98.1 F (36.7 C) (Oral)   Resp 20   Ht '5\' 7"'$  (1.702 m)   Wt 90.7 kg   SpO2 97%   BMI 31.32 kg/m   Physical Exam Constitutional:      General: He is not in acute distress.    Appearance: Normal appearance. He is not toxic-appearing.  Musculoskeletal:     Comments: Trace pitting edema of bilateral lower extremities with chronic venous stasis changes. Small abrasion to anterior R shin and 60m erythematous papule on right posterior shin.  No drainage, induration, increased  warmth or streaking erythema.  2+ DP and PT pulses bilaterally. No calf tenderness.    Skin:    General: Skin is warm and dry.  Neurological:     General: No focal deficit present.     Mental Status: He is alert. Mental status is at baseline.    ED Results / Procedures / Treatments   Labs (all labs ordered are listed, but only abnormal results are displayed) Labs Reviewed - No data to display  EKG None  Radiology No results found.  Procedures Procedures   Medications Ordered in ED Medications - No data to display  ED Course  I have reviewed the triage vital signs and the nursing notes.  Pertinent labs & imaging results that were available during my care of the patient were reviewed by me and considered in my medical decision making (see chart for details).    MDM Rules/Calculators/A&P                         86 year old gentleman presenting for evaluation of leg "wound" He noticed a small bump on his right posterior leg 1 month ago that seemed to be fluid-filled. Also has an abrasion to his right anterior shin. He would like to be sure these areas are not infected.  Initial differential included cellulitis vs benign skin lesion vs DVT vs CHF. History is reassuring as he has no diffuse edema, no dyspnea, no calf or leg pain, no change in coloration of his skin, no fever or chills. He and his friend report his legs look the same as always aside from the 2 spots mentioned above.  On exam he  has chronic venous stasis changes and trace pitting edema bilaterally. No calf tenderness or unilateral swelling/redness. Very low suspicion for CHF or DVT at this time. No increased warmth, no drainage, no induration or streaking redness to suggest cellulitis.  Reassurance provided. Stable for discharge home. Return precautions discussed. He has upcoming appointment with his PCP.    Final Clinical Impression(s) / ED Diagnoses Final diagnoses:  Venous stasis dermatitis of both lower extremities  Abrasion    Rx / DC Orders ED Discharge Orders     None      Alcus Dad, MD PGY-2 [facility]   Alcus Dad, MD 02/02/21 1355    Lucrezia Starch, MD 02/03/21 1515

## 2021-02-02 NOTE — Discharge Instructions (Addendum)
Today you were evaluated in the emergency department for a leg wound. There are no signs of infection. Please follow up with your primary care doctor as scheduled. Reasons to return to the ED: -draining of thick, pus-like material -fever/chills -worsening leg swelling associated with shortness of breath

## 2021-02-04 NOTE — Progress Notes (Signed)
Remote pacemaker transmission.   

## 2021-02-14 ENCOUNTER — Ambulatory Visit: Payer: Medicare HMO | Admitting: Family Medicine

## 2021-02-15 ENCOUNTER — Ambulatory Visit (INDEPENDENT_AMBULATORY_CARE_PROVIDER_SITE_OTHER): Payer: Medicare HMO

## 2021-02-15 ENCOUNTER — Other Ambulatory Visit: Payer: Self-pay

## 2021-02-15 DIAGNOSIS — Z7901 Long term (current) use of anticoagulants: Secondary | ICD-10-CM | POA: Diagnosis not present

## 2021-02-15 LAB — POCT INR: INR: 2.8 (ref 2.0–3.0)

## 2021-02-15 NOTE — Progress Notes (Signed)
I have reviewed and agree with note, evaluation, plan.   Berniece Abid, MD  

## 2021-02-15 NOTE — Patient Instructions (Addendum)
Pre visit review using our clinic review tool, if applicable. No additional management support is needed unless otherwise documented below in the visit note.  Continue to take 1 tablet daily except 2 tablets on Mondays only. Re-check in 6 weeks.

## 2021-03-13 ENCOUNTER — Telehealth: Payer: Self-pay

## 2021-03-13 DIAGNOSIS — Z7901 Long term (current) use of anticoagulants: Secondary | ICD-10-CM

## 2021-03-13 MED ORDER — WARFARIN SODIUM 2.5 MG PO TABS: ORAL_TABLET | ORAL | 1 refills | Status: DC

## 2021-03-13 NOTE — Telephone Encounter (Signed)
  Encourage patient to contact the pharmacy for refills or they can request refills through [place]:  09/29/20   NEXT APPOINTMENT DATE: 03/31/21  MEDICATION: warfarin (COUMADIN) 2.5 MG tablet  Is the patient out of medication? No   PHARMACY: [place], [place]  Let patient know to contact pharmacy at the end of the day to make sure medication is ready.  Please notify patient to allow 48-72 hours to process

## 2021-03-13 NOTE — Telephone Encounter (Signed)
Medication sent to pharmacy  

## 2021-03-24 NOTE — Progress Notes (Signed)
Phone [phone] In person visit   Subjective:   Shawn [name] is a 86 y.o. year old very pleasant male patient who presents for/with See problem oriented charting Chief Complaint  Patient presents with   Follow-up   urinary incontinenece     Proscar not helping still having leaks. Trouble holing an erection as well.     This visit occurred during the SARS-CoV-2 public health emergency.  Safety protocols were in place, including screening questions prior to the visit, additional usage of staff PPE, and extensive cleaning of exam room while observing appropriate contact time as indicated for disinfecting solutions.   Past Medical History-  Patient Active Problem List   Diagnosis Date Noted   Complete heart block ([place]) 04/02/2019    Priority: 1.   CAD, NATIVE VESSEL 09/27/2008    Priority: 1.   PACEMAKER, PERMANENT 03/11/2007    Priority: 1.   Atrial fibrillation ([place]) 02/11/2007    Priority: 1.   Benign paroxysmal positional vertigo of right ear 09/24/2019    Priority: 2.   Status post total right knee replacement 08/11/2019    Priority: 2.   History of adenomatous polyp of colon 09/29/2014    Priority: 2.   BPH associated with nocturia 09/09/2014    Priority: 2.   Hyperlipidemia 03/10/2008    Priority: 2.   Essential hypertension 12/02/2006    Priority: 2.   Aortic atherosclerosis ([place]) 10/28/2018    Priority: 3.   Encounter for therapeutic drug monitoring 09/24/2013    Priority: 3.   History of thrombocytopenia 06/17/2011    Priority: 3.   Hx of skin cancer, basal cell 04/07/2008    Priority: 3.   Actinic keratosis 04/07/2008    Priority: 3.   Allergic rhinitis 12/02/2006    Priority: 3.   GERD 12/02/2006    Priority: 3.   Obese 08/12/2019   Benign microscopic hematuria 10/02/2018   Nocturia 10/02/2018   Long term (current) use of anticoagulants 02/18/2017    Medications- reviewed and updated Current Outpatient Medications  Medication Sig  Dispense Refill   atorvastatin (LIPITOR) 40 MG tablet TAKE 1 TABLET EVERY DAY AT 6PM 90 tablet 1   diltiazem (CARDIZEM CD) 240 MG 24 hr capsule TAKE 1 CAPSULE EVERY DAY 90 capsule 3   finasteride (PROSCAR) 5 MG tablet TAKE 1 TABLET EVERY DAY 90 tablet 2   fluticasone (FLONASE) 50 MCG/ACT nasal spray Place 2 sprays into both nostrils daily. 48 g 3   metoprolol succinate (TOPROL-XL) 50 MG 24 hr tablet TAKE 1 AND 1/2 TABLETS EVERY DAY. TAKE WITH OR IMMEDIATELY FOLLOWING A MEAL. 135 tablet 1   Multiple Vitamins-Minerals (MULTIVITAMIN & MINERAL PO) Take 1 tablet by mouth every other day.      tamsulosin (FLOMAX) 0.4 MG CAPS capsule TAKE 2 CAPSULES EVERY DAY (NEED MD APPOINTMENT) 180 capsule 0   warfarin (COUMADIN) 2.5 MG tablet Take 1 tablet daily except take 2 tablets on Monday or Take as directed by anticoagulation clinic 120 tablet 1   nitroGLYCERIN (NITROSTAT) 0.4 MG SL tablet Place 1 tablet (0.4 mg total) under the tongue every 5 (five) minutes as needed for chest pain. (Patient not taking: No sig reported) 25 tablet 1   No current facility-administered medications for this visit.     Objective:  BP 109/66   Pulse 66   Temp 98.1 F (36.7 C) (Oral)   Ht 5\' 7"  (1.702 m)   Wt 207 lb 12.8 oz (94.3 kg)   SpO2  97%   BMI 32.55 kg/m  Gen: NAD, resting comfortably CV: RRR (no obvious a fib but cannot rule out without ekg)no murmurs rubs or gallops Lungs: CTAB no crackles, wheeze, rhonchi Ext: 1+ edema with venous stasis skin changes Skin: warm, dry Neuro: blown right pupil    Assessment and Plan   # ED F/U for leg pain S:Patient was seen in ED on 02/02/2021 for an evaluation of leg pain. He reported he noticed a small pocket of fluid in his right posterior leg a month before visit. Two weeks before ED visit, he tried to drain it with a sterilized needle and expressed small amount of clear fluid and blood however still had a bump in that area. Also reported an abrasion to his right anterior  shin. He denied any leg pain or color change or increased redness.  It was thought this was related to chronic venous stasis without concern for cellulitis-plan was outpatient follow-up A/P: This looks like venous stasis skin changes-prior fluid-filled pocket have resolved but we discussed potential for recurrence-we discussed elevating the legs and using compression stockings (as long as he does not have significant worsening of leg pain or claudication symptoms)  #CAD with history of stents-nitroglycerin on hand-no chest pain or shortness of breath #Aortic atherosclerosis #hyperlipidemia S: Medication: Atorvastatin 40 mg, not on aspirin due to being on Coumadin Lab Results  Component Value Date   CHOL 89 09/29/2020   HDL 35.30 (L) 09/29/2020   LDLCALC 37 09/29/2020   LDLDIRECT 46.0 09/24/2019   TRIG 83.0 09/29/2020   CHOLHDL 3 09/29/2020   A/P: CAD remains asymptomatic as is aortic atherosclerosis-continue current medication including statin and Coumadin-remain off of aspirin.  Has nitroglycerin on hand but not needing-offered refill  For hyperlipidemia LDL goal 70 or less and was at goal at last visit-continue current medication   # Atrial fibrillation with pacemaker due to history of tachybradycardia and complete heart block syndrome-follows Dr. Lovena Le S: Rate controlled with diltiazem 240 mg extended release, metoprolol 75 mg extended release Anticoagulated with Coumadin A/P: a fibAppropriately anticoagulated and rate controlled-continue current medication. Heart block stable with pacemaker. Also follows with Dr. Percival Spanish   #hypertension S: medication: Metoprolol 75 mg extended release, diltiazem 240 mg extended release. No orthostatic symptoms BP Readings from Last 3 Encounters:  03/31/21 109/66  02/02/21 (!) 143/59  10/04/20 118/60  A/P: Well-controlled primarily with A. fib medications-continue current medication  #BPH S: Medication: Finasteride 5 mg daily, tamsulosin 0.4 mg  daily. Nocturia twice a night tolerable in past but now worse. Still experiencing incontinence- worsening. Occasional mild dysuria A/P: with ongoing symptoms despite finasteride and Flomax offered referral to urology- rule out UTI first- he declines urology follow up for now- has seen them in the past   #History of adenomatous polyps-patient declines further follow-up with GI despite history-prefers not to have invasive procedure unless anemic or frank blood in the stool. Plus would have increased risk on coumadin and with a fib history Lab Results  Component Value Date   WBC 9.4 09/29/2020   HGB 14.2 09/29/2020   HCT 41.5 09/29/2020   MCV 90.2 09/29/2020   PLT 185.0 09/29/2020   #history of skin cancer- agrees to call dermatology back  #Allergies/allergic rhinitis S: Medication: Flonase helpful when used. Occaisonally if gets congested iwll get some sinus pressure- tylenol resolves A/P: overall stable- continue current meds  #weight up slightly- agrees to slightly agreeto reduce portion sizes. Active around the home- consider some walking  Recommended  follow up: No follow-ups on file. Future Appointments  Date Time Provider [place]  04/17/2021  8:05 AM [facility] DEVICE REMOTES [facility]  05/10/2021  9:30 AM [facility] COUMADIN CLINIC [facility]  05/17/2021 11:00 AM Minus Breeding, MD [facility]  07/17/2021  8:05 AM [facility] DEVICE REMOTES [facility]  10/12/2021  3:15 PM [facility] HEALTH COACH [facility]   Lab/Order associations:   ICD-10-CM   1. Essential hypertension  I10     2. Hyperlipidemia, unspecified hyperlipidemia type  E78.5 CBC with Differential/Platelet    Comprehensive metabolic panel    3. Atrial fibrillation, unspecified type ([place])  I48.91     4. Aortic atherosclerosis (HCC)  I70.0     5. Polyuria  R35.89 POCT Urinalysis Dipstick (Automated)    Urine Culture    6. BPH associated with nocturia  N40.1 POCT  Urinalysis Dipstick (Automated)   R35.1 Urine Culture    7. Need for immunization against influenza  Z23 Flu Vaccine QUAD High Dose(Fluad)    8. Complete heart block (HCC)  I44.2     9. Atherosclerosis of native coronary artery of native heart without angina pectoris  I25.10       No orders of the defined types were placed in this encounter.   I,Jada Bradford,acting as a scribe for Garret Reddish, MD.,have documented all relevant documentation on the behalf of Garret Reddish, MD,as directed by  Garret Reddish, MD while in the presence of Garret Reddish, MD.  I, Garret Reddish, MD, have reviewed all documentation for this visit. The documentation on 03/31/21 for the exam, diagnosis, procedures, and orders are all accurate and complete.  Return precautions advised.  Garret Reddish, MD

## 2021-03-29 ENCOUNTER — Other Ambulatory Visit: Payer: Self-pay

## 2021-03-29 ENCOUNTER — Ambulatory Visit (INDEPENDENT_AMBULATORY_CARE_PROVIDER_SITE_OTHER): Payer: Medicare HMO

## 2021-03-29 DIAGNOSIS — Z7901 Long term (current) use of anticoagulants: Secondary | ICD-10-CM | POA: Diagnosis not present

## 2021-03-29 LAB — POCT INR: INR: 2.3 (ref 2.0–3.0)

## 2021-03-29 NOTE — Patient Instructions (Addendum)
Pre visit review using our clinic review tool, if applicable. No additional management support is needed unless otherwise documented below in the visit note.  Continue to take 1 tablet daily except 2 tablets on Tuesdays only. Re-check in 6 weeks.

## 2021-03-29 NOTE — Progress Notes (Signed)
I have reviewed and agree with note, evaluation, plan.   Solymar Grace, MD  

## 2021-03-31 ENCOUNTER — Other Ambulatory Visit: Payer: Self-pay

## 2021-03-31 ENCOUNTER — Encounter: Payer: Self-pay | Admitting: Family Medicine

## 2021-03-31 ENCOUNTER — Ambulatory Visit (INDEPENDENT_AMBULATORY_CARE_PROVIDER_SITE_OTHER): Payer: Medicare HMO | Admitting: Family Medicine

## 2021-03-31 VITALS — BP 109/66 | HR 66 | Temp 98.1°F | Ht 67.0 in | Wt 207.8 lb

## 2021-03-31 DIAGNOSIS — I442 Atrioventricular block, complete: Secondary | ICD-10-CM | POA: Diagnosis not present

## 2021-03-31 DIAGNOSIS — R3589 Other polyuria: Secondary | ICD-10-CM

## 2021-03-31 DIAGNOSIS — I10 Essential (primary) hypertension: Secondary | ICD-10-CM

## 2021-03-31 DIAGNOSIS — R351 Nocturia: Secondary | ICD-10-CM

## 2021-03-31 DIAGNOSIS — Z23 Encounter for immunization: Secondary | ICD-10-CM | POA: Diagnosis not present

## 2021-03-31 DIAGNOSIS — E785 Hyperlipidemia, unspecified: Secondary | ICD-10-CM

## 2021-03-31 DIAGNOSIS — I700 Atherosclerosis of aorta: Secondary | ICD-10-CM | POA: Diagnosis not present

## 2021-03-31 DIAGNOSIS — I4891 Unspecified atrial fibrillation: Secondary | ICD-10-CM

## 2021-03-31 DIAGNOSIS — I2510 Atherosclerotic heart disease of native coronary artery without angina pectoris: Secondary | ICD-10-CM

## 2021-03-31 DIAGNOSIS — N401 Enlarged prostate with lower urinary tract symptoms: Secondary | ICD-10-CM

## 2021-03-31 NOTE — Addendum Note (Signed)
Addended by: Loura Back on: 03/31/2021 02:36 PM   Modules accepted: Orders

## 2021-03-31 NOTE — Patient Instructions (Addendum)
Health Maintenance Due  Topic Date Due   Zoster Vaccines- Shingrix (1 of 2)-  Please check with your pharmacy to see if they have the shingrix vaccine. If they do- please get this immunization and update [place] by phone call or mychart with dates you receive the vaccine Never done   Please start wearing compression socks and start elevating your legs.   Please stop by lab before you go If you have mychart- we will send your results within 3 business days of [place] receiving them.  If you do not have mychart- we will call you about results within 5 business days of [place] receiving them.  *please also note that you will see labs on mychart as soon as they post. I will later go in and write notes on them- will say "notes from Dr. Yong Channel"  Recommended follow up: Return in about 6 months (around 09/29/2021) for physical or sooner if needed.

## 2021-04-01 LAB — URINE CULTURE
MICRO NUMBER:: 12565847
Result:: NO GROWTH
SPECIMEN QUALITY:: ADEQUATE

## 2021-04-04 ENCOUNTER — Telehealth: Payer: Self-pay

## 2021-04-04 NOTE — Telephone Encounter (Signed)
Pt was returning a call. Please Advise.  

## 2021-04-05 NOTE — Addendum Note (Signed)
Addended by: Loura Back on: 04/05/2021 11:09 AM   Modules accepted: Orders

## 2021-04-05 NOTE — Telephone Encounter (Signed)
Lft VM to rtn call. Dm/cma  

## 2021-04-17 ENCOUNTER — Ambulatory Visit (INDEPENDENT_AMBULATORY_CARE_PROVIDER_SITE_OTHER): Payer: Medicare HMO

## 2021-04-17 DIAGNOSIS — I442 Atrioventricular block, complete: Secondary | ICD-10-CM | POA: Diagnosis not present

## 2021-04-18 LAB — CUP PACEART REMOTE DEVICE CHECK
Battery Impedance: 2439 Ohm
Battery Remaining Longevity: 30 mo
Battery Voltage: 2.74 V
Brady Statistic RV Percent Paced: 91 %
Date Time Interrogation Session: 20221114092433
Implantable Lead Implant Date: 20081007
Implantable Lead Implant Date: 20081007
Implantable Lead Location: 753859
Implantable Lead Location: 753860
Implantable Lead Model: 5076
Implantable Lead Model: 5076
Implantable Pulse Generator Implant Date: 20160714
Lead Channel Impedance Value: 0 Ohm
Lead Channel Impedance Value: 529 Ohm
Lead Channel Pacing Threshold Amplitude: 0.75 V
Lead Channel Pacing Threshold Pulse Width: 0.4 ms
Lead Channel Setting Pacing Amplitude: 2 V
Lead Channel Setting Pacing Pulse Width: 0.4 ms
Lead Channel Setting Sensing Sensitivity: 1.4 mV

## 2021-04-24 NOTE — Progress Notes (Signed)
Remote pacemaker transmission.   

## 2021-05-09 DIAGNOSIS — L821 Other seborrheic keratosis: Secondary | ICD-10-CM | POA: Diagnosis not present

## 2021-05-09 DIAGNOSIS — L570 Actinic keratosis: Secondary | ICD-10-CM | POA: Diagnosis not present

## 2021-05-10 ENCOUNTER — Other Ambulatory Visit: Payer: Self-pay

## 2021-05-10 ENCOUNTER — Ambulatory Visit (INDEPENDENT_AMBULATORY_CARE_PROVIDER_SITE_OTHER): Payer: Medicare HMO

## 2021-05-10 DIAGNOSIS — Z7901 Long term (current) use of anticoagulants: Secondary | ICD-10-CM | POA: Diagnosis not present

## 2021-05-10 LAB — POCT INR: INR: 3 (ref 2.0–3.0)

## 2021-05-10 NOTE — Progress Notes (Signed)
Continue to take 1 tablet daily except 2 tablets on Tuesdays only. Re-check in 6 weeks.

## 2021-05-10 NOTE — Progress Notes (Signed)
I have reviewed and agree with note, evaluation, plan.   Albie Bazin, MD  

## 2021-05-10 NOTE — Patient Instructions (Addendum)
Pre visit review using our clinic review tool, if applicable. No additional management support is needed unless otherwise documented below in the visit note.  Continue to take 1 tablet daily except 2 tablets on Tuesdays only. Re-check in 6 weeks.

## 2021-05-15 DIAGNOSIS — H4031X2 Glaucoma secondary to eye trauma, right eye, moderate stage: Secondary | ICD-10-CM | POA: Diagnosis not present

## 2021-05-15 DIAGNOSIS — Z961 Presence of intraocular lens: Secondary | ICD-10-CM | POA: Diagnosis not present

## 2021-05-15 DIAGNOSIS — H04123 Dry eye syndrome of bilateral lacrimal glands: Secondary | ICD-10-CM | POA: Diagnosis not present

## 2021-05-15 NOTE — Progress Notes (Signed)
Cardiology Office Note   Date:  05/17/2021   ID:  Shawn [name], DOB 1934/06/29, MRN [id number]  PCP:  Shawn Olp, MD  Cardiologist:   Shawn Breeding, MD   Chief Complaint  Patient presents with   Atrial Fibrillation       History of Present Illness: Shawn [name] is a 86 y.o. male who presents for followup of atrial fib. Since I last saw him he has done well from a cardiac standpoint.  He says that he does have some problems with diarrhea and constipation.  However, he is not describing any cardiac complaints.  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  His granddaughter works for [place] and  just had a baby.    The patient denies any new symptoms such as chest discomfort, neck or arm discomfort. There has been no new shortness of breath, PND or orthopnea. There have been no reported palpitations, presyncope or syncope.    Past Medical History:  Diagnosis Date   Actinic keratosis    Adenomatous colon polyp    ALLERGIC RHINITIS    Arthritis    CAD, NATIVE VESSEL    a. DES x2 to RCA 2004 b. repeat PTCA to RCA 2008 c. PCI to RCA 2012 non-DES    Cellulitis and abscess of trunk    Diverticulosis    Dysrhythmia    GERD (gastroesophageal reflux disease)    HYPERLIPIDEMIA    HYPERTENSION    OTH MALIG NEOPLASM SKIN OTH\T\UNSPEC PARTS FACE    Permanent atrial fibrillation (HCC)    PONV (postoperative nausea and vomiting)    Presence of permanent cardiac pacemaker    Skin cancer    Tachy-brady syndrome (HCC)    a. s/p MDT dual chamber pacemaker implant complicated by pericardial effusion and micro-perforatation s/p lead revisions    Past Surgical History:  Procedure Laterality Date   BACK SURGERY  2000   "for bulging discs"   CATARACT EXTRACTION W/ INTRAOCULAR LENS  IMPLANT, BILATERAL  2012   CORONARY ANGIOPLASTY WITH STENT PLACEMENT  2008   CORONARY ANGIOPLASTY WITH STENT PLACEMENT  05/14/11    CYSTECTOMY     from right side of face.   EP IMPLANTABLE DEVICE N/A 12/16/2014   Procedure: PPM Generator Changeout;  Surgeon: Evans Lance, MD;  Location: [facility];  Service: Cardiovascular;  Laterality: N/A;   LEAD REVISION  2008   11 days post initial implant, RA and RV lead revisions for micro-perforation and pericardial effusion by Dr Lovena Le   LEFT HEART CATHETERIZATION WITH CORONARY ANGIOGRAM N/A 05/14/2011   Procedure: LEFT HEART CATHETERIZATION WITH CORONARY ANGIOGRAM;  Surgeon: Hillary Bow, MD;  [facility];  Service: Cardiovascular;  Laterality: N/A;   PACEMAKER INSERTION  2008   MDT dual chamber pacemaker implanted by Dr Percival Spanish   TOTAL KNEE ARTHROPLASTY Right 08/11/2019   Procedure: TOTAL KNEE ARTHROPLASTY;  Surgeon: Paralee Cancel, MD;  Location: [facility];  Service: Orthopedics;  Laterality: Right;  70 mins     Current Outpatient Medications  Medication Sig Dispense Refill   atorvastatin (LIPITOR) 40 MG tablet TAKE 1 TABLET EVERY DAY AT 6PM 90 tablet 1   diltiazem (CARDIZEM CD) 240 MG 24 hr capsule TAKE 1 CAPSULE EVERY DAY 90 capsule 3   finasteride (PROSCAR) 5 MG tablet TAKE 1 TABLET EVERY DAY 90 tablet 2   fluticasone (FLONASE) 50 MCG/ACT nasal spray  Place 2 sprays into both nostrils daily. 48 g 3   metoprolol succinate (TOPROL-XL) 50 MG 24 hr tablet TAKE 1 AND 1/2 TABLETS EVERY DAY. TAKE WITH OR IMMEDIATELY FOLLOWING A MEAL. 135 tablet 1   Multiple Vitamins-Minerals (MULTIVITAMIN & MINERAL PO) Take 1 tablet by mouth every other day.      nitroGLYCERIN (NITROSTAT) 0.4 MG SL tablet Place 1 tablet (0.4 mg total) under the tongue every 5 (five) minutes as needed for chest pain. 25 tablet 1   tamsulosin (FLOMAX) 0.4 MG CAPS capsule TAKE 2 CAPSULES EVERY DAY (NEED MD APPOINTMENT) 180 capsule 0   warfarin (COUMADIN) 2.5 MG tablet Take 1 tablet daily except take 2 tablets on Monday or Take as directed by anticoagulation clinic 120 tablet 1   No current  facility-administered medications for this visit.    Allergies:   Patient has no known allergies.    ROS:  Please see the history of present illness.   Otherwise, review of systems are positive for none.   All other systems are reviewed and negative.    PHYSICAL EXAM: VS:  BP 100/60 (BP Location: Left Arm, Patient Position: Sitting, Cuff Size: Normal)   Pulse 83   Ht 5\' 7"  (1.702 m)   Wt 209 lb (94.8 kg)   BMI 32.73 kg/m  , BMI Body mass index is 32.73 kg/m. GENERAL:  Well appearing NECK:  No jugular venous distention, waveform within normal limits, carotid upstroke brisk and symmetric, no bruits, no thyromegaly LUNGS:  Clear to auscultation bilaterally CHEST:  Well healed pacer pocket HEART:  PMI not displaced or sustained,S1 and S2 within normal limits, no S3, no clicks, no rubs, no murmurs, irregular ABD:  Flat, positive bowel sounds normal in frequency in pitch, no bruits, no rebound, no guarding, no midline pulsatile mass, no hepatomegaly, no splenomegaly EXT:  2 plus pulses throughout, mild left greater than right leg edema, no cyanosis no clubbing   EKG:  EKG is  ordered today. Atrial fibrillation with ventricular pacing 100% capture rate 83  Recent Labs: 09/29/2020: ALT 14; BUN 14; Creatinine, Ser 1.07; Hemoglobin 14.2; Platelets 185.0; Potassium 4.1; Sodium 140    Lipid Panel    Component Value Date/Time   CHOL 89 09/29/2020 1127   CHOL 109 04/16/2017 0923   TRIG 83.0 09/29/2020 1127   HDL 35.30 (L) 09/29/2020 1127   HDL 39 (L) 04/16/2017 0923   CHOLHDL 3 09/29/2020 1127   VLDL 16.6 09/29/2020 1127   LDLCALC 37 09/29/2020 1127   LDLCALC 48 04/16/2017 0923   LDLDIRECT 46.0 09/24/2019 1554      Wt Readings from Last 3 Encounters:  05/17/21 209 lb (94.8 kg)  03/31/21 207 lb 12.8 oz (94.3 kg)  02/02/21 200 lb (90.7 kg)      Other studies Reviewed: Additional studies/ records that were reviewed today include: Labs. Review of the above records demonstrates:   Please see elsewhere in the note.     ASSESSMENT AND PLAN:   ATRIAL FIBRILLATION -    [name] has a CHA2DS2 - VASc score of 4.   He tolerates this.  He feels no palpitations.  Tolerates anticoagulation.  No change in therapy.   CAD, NATIVE VESSEL -  He had a stress perfusion study in 2012 .  No change in therapy.    PACEMAKER, PERMANENT -  He had follow up with Dr. Lovena Le in December last year.  I did review the most recent interrogation.  He is up-to-date  and he has normal function.   DYSLIPIDEMIA - LDL was 37.  No change in therapy.   HTN - His blood pressure is at target.  No change in therapy.    Current medicines are reviewed at length with the patient today.  The patient does not have concerns regarding medicines.  The following changes have been made:  None  Labs/ tests ordered today include: None  Orders Placed This Encounter  Procedures   EKG 12-Lead      Disposition:   FU with me in me in 12 months.     Signed, Shawn Breeding, MD  05/17/2021 11:35 AM    [place]

## 2021-05-16 ENCOUNTER — Other Ambulatory Visit: Payer: Self-pay | Admitting: Family Medicine

## 2021-05-17 ENCOUNTER — Other Ambulatory Visit: Payer: Self-pay

## 2021-05-17 ENCOUNTER — Ambulatory Visit: Payer: Medicare HMO | Admitting: Cardiology

## 2021-05-17 ENCOUNTER — Encounter: Payer: Self-pay | Admitting: Cardiology

## 2021-05-17 VITALS — BP 100/60 | HR 83 | Ht 67.0 in | Wt 209.0 lb

## 2021-05-17 DIAGNOSIS — E785 Hyperlipidemia, unspecified: Secondary | ICD-10-CM | POA: Diagnosis not present

## 2021-05-17 DIAGNOSIS — I10 Essential (primary) hypertension: Secondary | ICD-10-CM | POA: Diagnosis not present

## 2021-05-17 DIAGNOSIS — I482 Chronic atrial fibrillation, unspecified: Secondary | ICD-10-CM

## 2021-05-17 NOTE — Patient Instructions (Signed)
Medication Instructions:  °Your Physician recommend you continue on your current medication as directed.   ° °*If you need a refill on your cardiac medications before your next appointment, please call your pharmacy* ° °Follow-Up: °At [facility], you and your health needs are our priority.  As part of our continuing mission to provide you with exceptional heart care, we have created designated Provider Care Teams.  These Care Teams include your primary Cardiologist (physician) and Advanced Practice Providers (APPs -  Physician Assistants and Nurse Practitioners) who all work together to provide you with the care you need, when you need it. ° °We recommend signing up for the patient portal called "MyChart".  Sign up information is provided on this After Visit Summary.  MyChart is used to connect with patients for Virtual Visits (Telemedicine).  Patients are able to view lab/test results, encounter notes, upcoming appointments, etc.  Non-urgent messages can be sent to your provider as well.   °To learn more about what you can do with MyChart, go to [email or web address].   ° °Your next appointment:   °12 month(s) ° °The format for your next appointment:   °In Person ° °Provider:   °James Hochrein, MD  ° ° ° °

## 2021-06-13 ENCOUNTER — Other Ambulatory Visit: Payer: Self-pay | Admitting: Family Medicine

## 2021-06-13 DIAGNOSIS — E785 Hyperlipidemia, unspecified: Secondary | ICD-10-CM

## 2021-06-21 ENCOUNTER — Ambulatory Visit (INDEPENDENT_AMBULATORY_CARE_PROVIDER_SITE_OTHER): Payer: Medicare HMO

## 2021-06-21 ENCOUNTER — Other Ambulatory Visit: Payer: Self-pay

## 2021-06-21 DIAGNOSIS — Z7901 Long term (current) use of anticoagulants: Secondary | ICD-10-CM | POA: Diagnosis not present

## 2021-06-21 LAB — POCT INR: INR: 2.7 (ref 2.0–3.0)

## 2021-06-21 NOTE — Patient Instructions (Addendum)
Pre visit review using our clinic review tool, if applicable. No additional management support is needed unless otherwise documented below in the visit note.  Continue to take 1 tablet daily except 2 tablets on Mondays only. Re-check in 6 weeks.

## 2021-06-21 NOTE — Progress Notes (Addendum)
Continue to take 1 tablet daily except 2 tablets on Mondays only. Re-check in 6 weeks.

## 2021-06-27 ENCOUNTER — Other Ambulatory Visit: Payer: Self-pay | Admitting: Family Medicine

## 2021-07-12 DIAGNOSIS — D1801 Hemangioma of skin and subcutaneous tissue: Secondary | ICD-10-CM | POA: Diagnosis not present

## 2021-07-12 DIAGNOSIS — L814 Other melanin hyperpigmentation: Secondary | ICD-10-CM | POA: Diagnosis not present

## 2021-07-12 DIAGNOSIS — L821 Other seborrheic keratosis: Secondary | ICD-10-CM | POA: Diagnosis not present

## 2021-07-12 DIAGNOSIS — L570 Actinic keratosis: Secondary | ICD-10-CM | POA: Diagnosis not present

## 2021-07-17 ENCOUNTER — Ambulatory Visit (INDEPENDENT_AMBULATORY_CARE_PROVIDER_SITE_OTHER): Payer: Medicare HMO

## 2021-07-17 DIAGNOSIS — I482 Chronic atrial fibrillation, unspecified: Secondary | ICD-10-CM | POA: Diagnosis not present

## 2021-07-19 LAB — CUP PACEART REMOTE DEVICE CHECK
Battery Impedance: 2606 Ohm
Battery Remaining Longevity: 28 mo
Battery Voltage: 2.73 V
Brady Statistic RV Percent Paced: 92 %
Date Time Interrogation Session: 20230215093135
Implantable Lead Implant Date: 20081007
Implantable Lead Implant Date: 20081007
Implantable Lead Location: 753859
Implantable Lead Location: 753860
Implantable Lead Model: 5076
Implantable Lead Model: 5076
Implantable Pulse Generator Implant Date: 20160714
Lead Channel Impedance Value: 0 Ohm
Lead Channel Impedance Value: 524 Ohm
Lead Channel Pacing Threshold Amplitude: 0.875 V
Lead Channel Pacing Threshold Pulse Width: 0.4 ms
Lead Channel Setting Pacing Amplitude: 2 V
Lead Channel Setting Pacing Pulse Width: 0.4 ms
Lead Channel Setting Sensing Sensitivity: 1.4 mV

## 2021-07-20 NOTE — Progress Notes (Signed)
Remote pacemaker transmission.   

## 2021-07-24 NOTE — Progress Notes (Signed)
Cardiology Office Note Date:  07/25/2021  Patient ID:  Shawn [name], [name] 07-08-1934, MRN [id number] PCP:  Shawn Olp, MD  Cardiologist:  Dr. Percival Spanish Electrophysiologist: Dr. Lovena Le    Chief Complaint:  over due annual visit  History of Present Illness: Shawn [name] is a 87 y.o. male with history of tachy-brady, SN dysfunction w/PPM, AFib, CAD (PCI to RCA 2004, 2008, 2012), HTN, HLD  He comes in today to be seen for Dr. Lovena Le, last seen by him Dec 2021, doing well, afib decribed as chronic and rate controlled.  No changes were made.  He saw Dr. Percival Spanish Dec 2022, feeling well, active, no changes were made.  TODAY He is doing well, no new symptoms, denies any changes in his exertional capacity No CP, palpitations or cardiac awareness. No near syncope or syncope. No bleeding or signs of bleeding, warfarin managed by his PMD  Device information MDT dual chamber PPM implanted 03/11/2007, gen change 12/16/2014 Programmed VVIR  Past Medical History:  Diagnosis Date   Actinic keratosis    Adenomatous colon polyp    ALLERGIC RHINITIS    Arthritis    CAD, NATIVE VESSEL    a. DES x2 to RCA 2004 b. repeat PTCA to RCA 2008 c. PCI to RCA 2012 non-DES    Cellulitis and abscess of trunk    Diverticulosis    Dysrhythmia    GERD (gastroesophageal reflux disease)    HYPERLIPIDEMIA    HYPERTENSION    OTH MALIG NEOPLASM SKIN OTH\T\UNSPEC PARTS FACE    Permanent atrial fibrillation (HCC)    PONV (postoperative nausea and vomiting)    Presence of permanent cardiac pacemaker    Skin cancer    Tachy-brady syndrome (HCC)    a. s/p MDT dual chamber pacemaker implant complicated by pericardial effusion and micro-perforatation s/p lead revisions    Past Surgical History:  Procedure Laterality Date   BACK SURGERY  2000   "for bulging discs"   CATARACT EXTRACTION W/ INTRAOCULAR LENS  IMPLANT, BILATERAL  2012   CORONARY ANGIOPLASTY WITH STENT PLACEMENT  2008   CORONARY  ANGIOPLASTY WITH STENT PLACEMENT  05/14/11   CYSTECTOMY     from right side of face.   EP IMPLANTABLE DEVICE N/A 12/16/2014   Procedure: PPM Generator Changeout;  Surgeon: Evans Lance, MD;  Location: [facility];  Service: Cardiovascular;  Laterality: N/A;   LEAD REVISION  2008   11 days post initial implant, RA and RV lead revisions for micro-perforation and pericardial effusion by Dr Lovena Le   LEFT HEART CATHETERIZATION WITH CORONARY ANGIOGRAM N/A 05/14/2011   Procedure: LEFT HEART CATHETERIZATION WITH CORONARY ANGIOGRAM;  Surgeon: Hillary Bow, MD;  Location: [facility];  Service: Cardiovascular;  Laterality: N/A;   PACEMAKER INSERTION  2008   MDT dual chamber pacemaker implanted by Dr Percival Spanish   TOTAL KNEE ARTHROPLASTY Right 08/11/2019   Procedure: TOTAL KNEE ARTHROPLASTY;  Surgeon: Paralee Cancel, MD;  Location: [facility];  Service: Orthopedics;  Laterality: Right;  70 mins    Current Outpatient Medications  Medication Sig Dispense Refill   acetaminophen (TYLENOL) 325 MG tablet Take 325-650 mg by mouth every 6 (six) hours as needed (pain).     atorvastatin (LIPITOR) 40 MG tablet TAKE 1 TABLET EVERY DAY AT 6PM 90 tablet 1   diltiazem (CARDIZEM CD) 240 MG 24 hr capsule TAKE 1 CAPSULE EVERY DAY 90 capsule 3   finasteride (PROSCAR) 5 MG tablet TAKE 1 TABLET EVERY DAY 90  tablet 2   fluticasone (FLONASE) 50 MCG/ACT nasal spray Place 2 sprays into both nostrils daily. 48 g 3   metoprolol succinate (TOPROL-XL) 50 MG 24 hr tablet TAKE 1 AND 1/2 TABLETS EVERY DAY. TAKE WITH OR IMMEDIATELY FOLLOWING A MEAL. 135 tablet 1   Multiple Vitamins-Minerals (MULTIVITAMIN & MINERAL PO) Take 1 tablet by mouth every other day.      nitroGLYCERIN (NITROSTAT) 0.4 MG SL tablet Place 1 tablet (0.4 mg total) under the tongue every 5 (five) minutes as needed for chest pain. 25 tablet 1   tamsulosin (FLOMAX) 0.4 MG CAPS capsule TAKE 2 CAPSULES EVERY DAY (NEED MD APPOINTMENT) 180 capsule 0   warfarin  (COUMADIN) 2.5 MG tablet Take 1 tablet daily except take 2 tablets on Monday or Take as directed by anticoagulation clinic 120 tablet 1   No current facility-administered medications for this visit.    Allergies:   Patient has no known allergies.   Social History:  The patient  reports that he has never smoked. He has been exposed to tobacco smoke. He has never used smokeless tobacco. He reports that he does not drink alcohol and does not use drugs.   Family History:  The patient's family history includes Colon cancer (age of onset: 108) in his father; Diabetes in his brother; Heart attack in his father; Heart disease in his brother; Ovarian cancer in his mother.  ROS:  Please see the history of present illness.    All other systems are reviewed and otherwise negative.   PHYSICAL EXAM:  VS:  BP 118/64    Pulse 86    Ht 5\' 7"  (1.702 m)    Wt 206 lb 12.8 oz (93.8 kg)    SpO2 96%    BMI 32.39 kg/m  BMI: Body mass index is 32.39 kg/m. Well nourished, well developed, in no acute distress HEENT: normocephalic, atraumatic Neck: no JVD, carotid bruits or masses Cardiac:  RRR; no significant murmurs, no rubs, or gallops Lungs:  CTA b/l, no wheezing, rhonchi or rales Abd: soft, nontender MS: no deformity or atrophy Ext: trace edema Skin: warm and dry, no rash Neuro:  No gross deficits appreciated Psych: euthymic mood, full affect  PPM site is stable, no tethering or discomfort   EKG:  not done today  Device interrogation done today and reviewed by myself:  Battery and elad measurements are good ONe NSVT Aug 2022 92.2% VP  06/30/2007: TTE  SUMMARY   -  Overall left ventricular systolic function was normal. Left         ventricular ejection fraction was estimated to be 60 %. There         was no diagnostic evidence of left ventricular regional wall         motion abnormalities.   -  The pulmonary veins were grossly normal.   -  The right ventricle was mildly dilated. Right  ventricular         systolic function was mildly reduced. There was the         appearance of a catheter or pacing wire in the right         ventricle.   -  There was a trivial pericardial effusion posterior to the heart.   Recent Labs: 09/29/2020: ALT 14; BUN 14; Creatinine, Ser 1.07; Hemoglobin 14.2; Platelets 185.0; Potassium 4.1; Sodium 140  09/29/2020: Cholesterol 89; HDL 35.30; LDL Cholesterol 37; Total CHOL/HDL Ratio 3; Triglycerides 83.0; VLDL 16.6   CrCl cannot be calculated (  Patient's most recent lab result is older than the maximum 21 days allowed.).   Wt Readings from Last 3 Encounters:  07/25/21 206 lb 12.8 oz (93.8 kg)  05/17/21 209 lb (94.8 kg)  03/31/21 207 lb 12.8 oz (94.3 kg)     Other studies reviewed: Additional studies/records reviewed today include: summarized above  ASSESSMENT AND PLAN:  Permanent Afib CHA2DS2Vasc is 4, on Warfarin Rate controlled, 92% VP  PPM Intact function, no programming changes made  CAD No anginal symptoms C/w Dr. Percival Spanish  Disposition: F/u with remotes as usual, in clinic with EP in a year, sooner if needed  Current medicines are reviewed at length with the patient today.  The patient did not have any concerns regarding medicines.  Venetia Night, PA-C 07/25/2021 12:37 PM     [address] [place] [address] [phone] (office)  [phone] (fax)

## 2021-07-25 ENCOUNTER — Encounter: Payer: Self-pay | Admitting: Physician Assistant

## 2021-07-25 ENCOUNTER — Ambulatory Visit: Payer: Medicare HMO | Admitting: Physician Assistant

## 2021-07-25 ENCOUNTER — Other Ambulatory Visit: Payer: Self-pay

## 2021-07-25 VITALS — BP 118/64 | HR 86 | Ht 67.0 in | Wt 206.8 lb

## 2021-07-25 DIAGNOSIS — Z950 Presence of cardiac pacemaker: Secondary | ICD-10-CM

## 2021-07-25 DIAGNOSIS — I4821 Permanent atrial fibrillation: Secondary | ICD-10-CM

## 2021-07-25 DIAGNOSIS — I442 Atrioventricular block, complete: Secondary | ICD-10-CM | POA: Diagnosis not present

## 2021-07-25 DIAGNOSIS — I2510 Atherosclerotic heart disease of native coronary artery without angina pectoris: Secondary | ICD-10-CM | POA: Diagnosis not present

## 2021-07-25 LAB — CUP PACEART INCLINIC DEVICE CHECK
Battery Impedance: 2518 Ohm
Battery Remaining Longevity: 29 mo
Battery Voltage: 2.74 V
Brady Statistic RV Percent Paced: 92 %
Date Time Interrogation Session: 20230221124338
Implantable Lead Implant Date: 20081007
Implantable Lead Implant Date: 20081007
Implantable Lead Location: 753859
Implantable Lead Location: 753860
Implantable Lead Model: 5076
Implantable Lead Model: 5076
Implantable Pulse Generator Implant Date: 20160714
Lead Channel Impedance Value: 0 Ohm
Lead Channel Impedance Value: 522 Ohm
Lead Channel Pacing Threshold Amplitude: 0.75 V
Lead Channel Pacing Threshold Amplitude: 0.75 V
Lead Channel Pacing Threshold Pulse Width: 0.4 ms
Lead Channel Pacing Threshold Pulse Width: 0.4 ms
Lead Channel Sensing Intrinsic Amplitude: 2.8 mV
Lead Channel Setting Pacing Amplitude: 2 V
Lead Channel Setting Pacing Pulse Width: 0.4 ms
Lead Channel Setting Sensing Sensitivity: 1.4 mV

## 2021-07-25 NOTE — Patient Instructions (Signed)
Medication Instructions:   Your physician recommends that you continue on your current medications as directed. Please refer to the Current Medication list given to you today.  *If you need a refill on your cardiac medications before your next appointment, please call your pharmacy*   Lab Work: [place]   If you have labs (blood work) drawn today and your tests are completely normal, you will receive your results only by: [place] (if you have MyChart) OR A paper copy in the mail If you have any lab test that is abnormal or we need to change your treatment, we will call you to review the results.   Testing/Procedures: NONE ORDERED  TODAY     Follow-Up: At [facility], you and your health needs are our priority.  As part of our continuing mission to provide you with exceptional heart care, we have created designated Provider Care Teams.  These Care Teams include your primary Cardiologist (physician) and Advanced Practice Providers (APPs -  Physician Assistants and Nurse Practitioners) who all work together to provide you with the care you need, when you need it.  We recommend signing up for the patient portal called "MyChart".  Sign up information is provided on this After Visit Summary.  MyChart is used to connect with patients for Virtual Visits (Telemedicine).  Patients are able to view lab/test results, encounter notes, upcoming appointments, etc.  Non-urgent messages can be sent to your provider as well.   To learn more about what you can do with MyChart, go to [email or web address].    Your next appointment:   1 year(s)  The format for your next appointment:   In Person  Provider:   You may see Dr. Cristopher Peru  or one of the following Advanced Practice Providers on your designated Care Team:   Tommye Standard, [place] "Oda Kilts, [place]     Other Instructions

## 2021-08-02 ENCOUNTER — Other Ambulatory Visit: Payer: Self-pay

## 2021-08-02 ENCOUNTER — Ambulatory Visit (INDEPENDENT_AMBULATORY_CARE_PROVIDER_SITE_OTHER): Payer: Medicare HMO

## 2021-08-02 DIAGNOSIS — Z7901 Long term (current) use of anticoagulants: Secondary | ICD-10-CM

## 2021-08-02 LAB — POCT INR
INR: 1.5 — AB (ref 2.0–3.0)
INR: 2.5 (ref 2.0–3.0)

## 2021-08-02 NOTE — Progress Notes (Signed)
Continue to take 1 tablet daily except 2 tablets on Mondays only. Re-check in 6 weeks. ?

## 2021-08-02 NOTE — Patient Instructions (Addendum)
Pre visit review using our clinic review tool, if applicable. No additional management support is needed unless otherwise documented below in the visit note. ? ?Continue to take 1 tablet daily except 2 tablets on Mondays only. Re-check in 6 weeks. ?

## 2021-08-02 NOTE — Progress Notes (Signed)
I have reviewed and agree with note, evaluation, plan.   Joshua Soulier, MD  

## 2021-09-13 ENCOUNTER — Ambulatory Visit (INDEPENDENT_AMBULATORY_CARE_PROVIDER_SITE_OTHER): Payer: Medicare HMO

## 2021-09-13 DIAGNOSIS — Z7901 Long term (current) use of anticoagulants: Secondary | ICD-10-CM | POA: Diagnosis not present

## 2021-09-13 LAB — POCT INR: INR: 2 (ref 2.0–3.0)

## 2021-09-13 NOTE — Progress Notes (Signed)
Continue to take 1 tablet daily except 2 tablets on Mondays only. Re-check in 6 weeks. ?

## 2021-09-13 NOTE — Patient Instructions (Addendum)
Pre visit review using our clinic review tool, if applicable. No additional management support is needed unless otherwise documented below in the visit note. ? ?Continue to take 1 tablet daily except 2 tablets on Mondays only. Re-check in 6 weeks. ?

## 2021-09-13 NOTE — Progress Notes (Signed)
I have reviewed the patient's encounter and agree with the documentation. ? ?Algis Greenhouse. Jerline Pain, MD ?09/13/2021 10:01 AM  ? ?

## 2021-10-02 ENCOUNTER — Other Ambulatory Visit: Payer: Self-pay | Admitting: Family Medicine

## 2021-10-02 DIAGNOSIS — Z7901 Long term (current) use of anticoagulants: Secondary | ICD-10-CM

## 2021-10-04 DIAGNOSIS — H04123 Dry eye syndrome of bilateral lacrimal glands: Secondary | ICD-10-CM | POA: Diagnosis not present

## 2021-10-04 DIAGNOSIS — Z961 Presence of intraocular lens: Secondary | ICD-10-CM | POA: Diagnosis not present

## 2021-10-04 DIAGNOSIS — H4031X2 Glaucoma secondary to eye trauma, right eye, moderate stage: Secondary | ICD-10-CM | POA: Diagnosis not present

## 2021-10-12 NOTE — Progress Notes (Signed)
This encounter was created in error - please disregard. Left voice message no response pt will need to be rescheduled ?

## 2021-10-16 ENCOUNTER — Ambulatory Visit (INDEPENDENT_AMBULATORY_CARE_PROVIDER_SITE_OTHER): Payer: Medicare HMO

## 2021-10-16 DIAGNOSIS — I442 Atrioventricular block, complete: Secondary | ICD-10-CM

## 2021-10-18 LAB — CUP PACEART REMOTE DEVICE CHECK
Battery Impedance: 2833 Ohm
Battery Remaining Longevity: 25 mo
Battery Voltage: 2.73 V
Brady Statistic RV Percent Paced: 96 %
Date Time Interrogation Session: 20230515081952
Implantable Lead Implant Date: 20081007
Implantable Lead Implant Date: 20081007
Implantable Lead Location: 753859
Implantable Lead Location: 753860
Implantable Lead Model: 5076
Implantable Lead Model: 5076
Implantable Pulse Generator Implant Date: 20160714
Lead Channel Impedance Value: 0 Ohm
Lead Channel Impedance Value: 527 Ohm
Lead Channel Pacing Threshold Amplitude: 0.75 V
Lead Channel Pacing Threshold Pulse Width: 0.4 ms
Lead Channel Setting Pacing Amplitude: 2 V
Lead Channel Setting Pacing Pulse Width: 0.4 ms
Lead Channel Setting Sensing Sensitivity: 1.4 mV

## 2021-10-25 ENCOUNTER — Ambulatory Visit (INDEPENDENT_AMBULATORY_CARE_PROVIDER_SITE_OTHER): Payer: Medicare HMO

## 2021-10-25 DIAGNOSIS — Z7901 Long term (current) use of anticoagulants: Secondary | ICD-10-CM | POA: Diagnosis not present

## 2021-10-25 LAB — POCT INR: INR: 2.6 (ref 2.0–3.0)

## 2021-10-25 NOTE — Progress Notes (Signed)
Continue to take 1 tablet daily except 2 tablets on Mondays only. Re-check in 6 weeks.

## 2021-10-25 NOTE — Patient Instructions (Addendum)
Pre visit review using our clinic review tool, if applicable. No additional management support is needed unless otherwise documented below in the visit note.  Continue to take 1 tablet daily except 2 tablets on Mondays only. Re-check in 6 weeks.

## 2021-10-25 NOTE — Progress Notes (Signed)
I have reviewed and agree with note, evaluation, plan.   Cristy Colmenares, MD  

## 2021-11-06 NOTE — Progress Notes (Signed)
Remote pacemaker transmission.   

## 2021-11-24 ENCOUNTER — Ambulatory Visit (INDEPENDENT_AMBULATORY_CARE_PROVIDER_SITE_OTHER): Payer: Medicare HMO

## 2021-11-24 DIAGNOSIS — Z0000 Encounter for general adult medical examination without abnormal findings: Secondary | ICD-10-CM

## 2021-11-24 NOTE — Progress Notes (Addendum)
Virtual Visit via Telephone Note  I connected with  [name] on 11/24/21 at  9:00 AM EDT by telephone and verified that I am speaking with the correct person using two identifiers.  Medicare Annual Wellness visit completed telephonically due to Covid-19 pandemic.   Persons participating in this call: This Health Coach and this patient.   Location: Patient: Home Provider: Office    I discussed the limitations, risks, security and privacy concerns of performing an evaluation and management service by telephone and the availability of in person appointments. The patient expressed understanding and agreed to proceed.  Unable to perform video visit due to video visit attempted and failed and/or patient does not have video capability.   Some vital signs may be absent or patient reported.   Shawn Schlein, LPN   Subjective:   Shawn [name] is a 87 y.o. male who presents for Medicare Annual/Subsequent preventive examination.  Review of Systems     Cardiac Risk Factors include: advanced age (>59men, >34 women);dyslipidemia;male gender;obesity (BMI >30kg/m2);hypertension     Objective:    There were no vitals filed for this visit. There is no height or weight on file to calculate BMI.     11/24/2021    9:00 AM 02/02/2021    9:46 AM 10/07/2020    3:25 PM 08/11/2019    8:22 AM 08/05/2019    2:08 PM 07/28/2019   12:25 PM 02/06/2018    9:14 AM  Advanced Directives  Does Patient Have a Medical Advance Directive? Yes Yes Yes Yes Yes Yes Yes  Type of Estate agent of [place];Living will  Healthcare Power of [employer] of [place];Living will Healthcare Power of [place];Living will Living will;Healthcare Power of [employer] Power of [place];Living will  Does patient want to make changes to medical advance directive?    No - Patient declined No - Patient declined No - Patient declined No - Patient declined  Copy of Healthcare Power of  Attorney in Chart? No - copy requested  No - copy requested No - copy requested No - copy requested No - copy requested No - copy requested    Current Medications (verified) Outpatient Encounter Medications as of 11/24/2021  Medication Sig   acetaminophen (TYLENOL) 325 MG tablet Take 325-650 mg by mouth every 6 (six) hours as needed (pain).   atorvastatin (LIPITOR) 40 MG tablet TAKE 1 TABLET EVERY DAY AT 6PM   diltiazem (CARDIZEM CD) 240 MG 24 hr capsule TAKE 1 CAPSULE EVERY DAY   finasteride (PROSCAR) 5 MG tablet TAKE 1 TABLET EVERY DAY   fluticasone (FLONASE) 50 MCG/ACT nasal spray Place 2 sprays into both nostrils daily.   metoprolol succinate (TOPROL-XL) 50 MG 24 hr tablet TAKE 1 AND 1/2 TABLETS EVERY DAY. TAKE WITH OR IMMEDIATELY FOLLOWING A MEAL.   Multiple Vitamins-Minerals (MULTIVITAMIN & MINERAL PO) Take 1 tablet by mouth every other day.    tamsulosin (FLOMAX) 0.4 MG CAPS capsule TAKE 2 CAPSULES EVERY DAY (NEED MD APPOINTMENT)   warfarin (COUMADIN) 2.5 MG tablet TAKE 1 TABLET DAILY EXCEPT TAKE 2 TABLETS ON MONDAY OR TAKE AS DIRECTED BY ANTICOAGULATION CLINIC   nitroGLYCERIN (NITROSTAT) 0.4 MG SL tablet Place 1 tablet (0.4 mg total) under the tongue every 5 (five) minutes as needed for chest pain. (Patient not taking: Reported on 11/24/2021)   No facility-administered encounter medications on file as of 11/24/2021.    Allergies (verified) Patient has no known allergies.   History: Past Medical History:  Diagnosis Date   Actinic keratosis    Adenomatous colon polyp    ALLERGIC RHINITIS    Arthritis    CAD, NATIVE VESSEL    a. DES x2 to RCA 2004 b. repeat PTCA to RCA 2008 c. PCI to RCA 2012 non-DES    Cellulitis and abscess of trunk    Diverticulosis    Dysrhythmia    GERD (gastroesophageal reflux disease)    HYPERLIPIDEMIA    HYPERTENSION    OTH MALIG NEOPLASM SKIN OTH\T\UNSPEC PARTS FACE    Permanent atrial fibrillation (HCC)    PONV (postoperative nausea and vomiting)     Presence of permanent cardiac pacemaker    Skin cancer    Tachy-brady syndrome (HCC)    a. s/p MDT dual chamber pacemaker implant complicated by pericardial effusion and micro-perforatation s/p lead revisions   Past Surgical History:  Procedure Laterality Date   BACK SURGERY  2000   "for bulging discs"   CATARACT EXTRACTION W/ INTRAOCULAR LENS  IMPLANT, BILATERAL  2012   CORONARY ANGIOPLASTY WITH STENT PLACEMENT  2008   CORONARY ANGIOPLASTY WITH STENT PLACEMENT  05/14/11   CYSTECTOMY     from right side of face.   EP IMPLANTABLE DEVICE N/A 12/16/2014   Procedure: PPM Generator Changeout;  Surgeon: Marinus Maw, MD;  Location: [facility] INVASIVE CV LAB;  Service: Cardiovascular;  Laterality: N/A;   LEAD REVISION  2008   11 days post initial implant, RA and RV lead revisions for micro-perforation and pericardial effusion by Dr Ladona Ridgel   LEFT HEART CATHETERIZATION WITH CORONARY ANGIOGRAM N/A 05/14/2011   Procedure: LEFT HEART CATHETERIZATION WITH CORONARY ANGIOGRAM;  Surgeon: Herby Abraham, MD;  Location[facility] CATH LAB;  Service: Cardiovascular;  Laterality: N/A;   PACEMAKER INSERTION  2008   MDT dual chamber pacemaker implanted by Dr Antoine Poche   TOTAL KNEE ARTHROPLASTY Right 08/11/2019   Procedure: TOTAL KNEE ARTHROPLASTY;  Surgeon: Durene Romans, MD;  Location: [facility] ORS;  Service: Orthopedics;  Laterality: Right;  70 mins   Family History  Problem Relation Age of Onset   Ovarian cancer Mother    Heart attack Father    Colon cancer Father 41       deceased 58   Diabetes Brother        x 2   Heart disease Brother        x 3   Social History   Socioeconomic History   Marital status: Married    Spouse name: Not on file   Number of children: 2   Years of education: Not on file   Highest education level: Not on file  Occupational History   Occupation: retired    Comment: Railroad   Tobacco Use   Smoking status: Never    Passive exposure: Past   Smokeless tobacco: Never  Vaping Use    Vaping Use: Never used  Substance and Sexual Activity   Alcohol use: No    Comment: 05/14/11 "last occasional drink was in 1990's"   Drug use: No   Sexual activity: Yes  Other Topics Concern   Not on file  Social History Narrative   Widowed March 2016. 2 sons. 4 grandkids- all through college. 1 greatgrandchild on way 11/2015.    Lives by himself.       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  Social Determinants of Health   Financial Resource Strain: Low Risk  (11/24/2021)   Overall Financial Resource Strain (CARDIA)    Difficulty of Paying Living Expenses: Not hard at all  Food Insecurity: No Food Insecurity (11/24/2021)   Hunger Vital Sign    Worried About Running Out of Food in the Last Year: Never true    Ran Out of Food in the Last Year: Never true  Transportation Needs: No Transportation Needs (11/24/2021)   PRAPARE - Administrator, Civil Service (Medical): No    Lack of Transportation (Non-Medical): No  Physical Activity: Inactive (11/24/2021)   Exercise Vital Sign    Days of Exercise per Week: 0 days    Minutes of Exercise per Session: 0 min  Stress: No Stress Concern Present (11/24/2021)   [employer] of Occupational Health - Occupational Stress Questionnaire    Feeling of Stress : Not at all  Social Connections: Moderately Integrated (11/24/2021)   Social Connection and Isolation Panel [NHANES]    Frequency of Communication with Friends and Family: More than three times a week    Frequency of Social Gatherings with Friends and Family: More than three times a week    Attends Religious Services: More than 4 times per year    Active Member of [employer] or Organizations: Yes    Attends Banker Meetings: Never    Marital Status: Widowed    Tobacco Counseling Counseling given: Not Answered   Clinical  Intake:  Pre-visit preparation completed: Yes  Pain : No/denies pain     BMI - recorded: 32.39 Nutritional Status: BMI > 30  Obese Nutritional Risks: None Diabetes: No  How often do you need to have someone help you when you read instructions, pamphlets, or other written materials from your doctor or pharmacy?: 1 - Never  Diabetic?no  Interpreter Needed?: No  Information entered by :: Lanier Ensign, LPN   Activities of Daily Living    11/24/2021    9:01 AM  In your present state of health, do you have any difficulty performing the following activities:  Hearing? 0  Vision? 0  Difficulty concentrating or making decisions? 0  Walking or climbing stairs? 0  Dressing or bathing? 0  Doing errands, shopping? 0  Preparing Food and eating ? N  Using the Toilet? N  In the past six months, have you accidently leaked urine? Y  Comment at times  Do you have problems with loss of bowel control? Y  Comment at times  Managing your Medications? N  Managing your Finances? N  Housekeeping or managing your Housekeeping? N    Patient Care Team: Shelva Majestic, MD as PCP - General (Family Medicine) Rollene Rotunda, MD as PCP - Cardiology (Cardiology) Shelva Majestic, MD (Family Medicine) Rene Paci, MD as Consulting Physician (Urology) Glennis Brink, MD as Consulting Physician (Dermatology) Nelson Chimes, MD as Consulting Physician (Ophthalmology) Durene Romans, MD as Consulting Physician (Orthopedic Surgery) Dahlia Byes, [facility] as Pharmacist (Pharmacist) Rollene Rotunda, MD as Consulting Physician (Cardiology)  Indicate any recent Medical Services you may have received from other than [facility] providers in the past year (date may be approximate).     Assessment:   This is a routine wellness examination for Amitoj.  Hearing/Vision screen Hearing Screening - Comments:: Pt denies any hearing issues  Vision Screening - Comments:: Pt follows up with Dr Hazle Quant for  annual eye exams   Dietary issues and exercise activities discussed: Current Exercise Habits: The patient  does not participate in regular exercise at present   Goals Addressed             This Visit's Progress    Patient Stated       Stay healthy        Depression Screen    11/24/2021    8:59 AM 10/07/2020    3:23 PM 09/29/2020   10:32 AM 09/24/2019    2:33 PM 07/28/2019   12:27 PM 10/28/2018   11:28 AM 07/07/2018    9:04 AM  PHQ 2/9 Scores  PHQ - 2 Score 0 0 0 0 0 0 3  PHQ- 9 Score   0 1  5 13     Fall Risk    11/24/2021    9:01 AM 10/07/2020    3:25 PM 09/29/2020   10:32 AM 07/28/2019   12:26 PM 10/28/2018   11:27 AM  Fall Risk   Falls in the past year? 0 0 0 0 0  Number falls in past yr: 0 0 0 0 0  Injury with Fall? 0 0 0 0 0  Risk for fall due to : Impaired vision Impaired vision  Orthopedic patient   Follow up Falls prevention discussed Falls prevention discussed  Education provided;Falls prevention discussed;Falls evaluation completed     FALL RISK PREVENTION PERTAINING TO THE HOME:  Any stairs in or around the home? Yes  If so, are there any without handrails? No  Home free of loose throw rugs in walkways, pet beds, electrical cords, etc? Yes  Adequate lighting in your home to reduce risk of falls? Yes   ASSISTIVE DEVICES UTILIZED TO PREVENT FALLS:  Life alert? No  Use of a cane, walker or w/c? No  Grab bars in the bathroom? Yes  Shower chair or bench in shower? Yes  Elevated toilet seat or a handicapped toilet? Yes   TIMED UP AND GO:  Was the test performed? No .   Cognitive Function:        11/24/2021    9:03 AM 10/07/2020    3:28 PM 07/28/2019   12:26 PM  6CIT Screen  What Year? 0 points 0 points 0 points  What month? 0 points 0 points 0 points  What time? 0 points  0 points  Count back from 20 0 points 0 points 0 points  Months in reverse 0 points 0 points 0 points  Repeat phrase 0 points 6 points 2 points  Total Score 0 points  2 points     Immunizations Immunization History  Administered Date(s) Administered   Fluad Quad(high Dose 65+) 04/01/2019, 03/28/2020, 03/31/2021   Influenza Split 03/24/2012   Influenza Whole 03/10/2008, 03/16/2009, 04/05/2010   Influenza, High Dose Seasonal PF 04/02/2016, 04/08/2017, 03/19/2018   Influenza,inj,Quad PF,6+ Mos 03/30/2013, 03/16/2014, 03/11/2015   PFIZER(Purple Top)SARS-COV-2 Vaccination 06/09/2019, 07/15/2019, 04/22/2020   Pneumococcal Conjugate-13 09/09/2014   Pneumococcal Polysaccharide-23 11/05/2011   Tdap 11/05/2011    TDAP status: Due, Education has been provided regarding the importance of this vaccine. Advised may receive this vaccine at local pharmacy or Health Dept. Aware to provide a copy of the vaccination record if obtained from local pharmacy or Health Dept. Verbalized acceptance and understanding.  Flu Vaccine status: Up to date  Pneumococcal vaccine status: Up to date  Covid-19 vaccine status: Completed vaccines  Qualifies for Shingles Vaccine? Yes   Zostavax completed No   Shingrix Completed?: No.    Education has been provided regarding the importance of this vaccine. Patient has been  advised to call insurance company to determine out of pocket expense if they have not yet received this vaccine. Advised may also receive vaccine at local pharmacy or Health Dept. Verbalized acceptance and understanding.  Screening Tests Health Maintenance  Topic Date Due   Zoster Vaccines- Shingrix (1 of 2) Never done   COVID-19 Vaccine (4 - Booster for Pfizer series) 06/17/2020   TETANUS/TDAP  11/04/2021   INFLUENZA VACCINE  01/02/2022   Pneumonia Vaccine 42+ Years old  Completed   HPV VACCINES  Aged Out   COLONOSCOPY (Pts 45-35yrs Insurance coverage will need to be confirmed)  Discontinued    Health Maintenance  Health Maintenance Due  Topic Date Due   Zoster Vaccines- Shingrix (1 of 2) Never done   COVID-19 Vaccine (4 - Booster for Pfizer series) 06/17/2020    TETANUS/TDAP  11/04/2021    Colorectal cancer screening: No longer required.    Additional Screening:   Vision Screening: Recommended annual ophthalmology exams for early detection of glaucoma and other disorders of the eye. Is the patient up to date with their annual eye exam?  Yes  Who is the provider or what is the name of the office in which the patient attends annual eye exams? Dr Hazle Quant  If pt is not established with a provider, would they like to be referred to a provider to establish care? No .   Dental Screening: Recommended annual dental exams for proper oral hygiene  Community Resource Referral / Chronic Care Management: CRR required this visit?  No   CCM required this visit?  No      Plan:     I have personally reviewed and noted the following in the patient's chart:   Medical and social history Use of alcohol, tobacco or illicit drugs  Current medications and supplements including opioid prescriptions. Patient is not currently taking opioid prescriptions. Functional ability and status Nutritional status Physical activity Advanced directives List of other physicians Hospitalizations, surgeries, and ER visits in previous 12 months Vitals Screenings to include cognitive, depression, and falls Referrals and appointments  In addition, I have reviewed and discussed with patient certain preventive protocols, quality metrics, and best practice recommendations. A written personalized care plan for preventive services as well as general preventive health recommendations were provided to patient.     Shawn Schlein, LPN   09/03/270   Nurse Notes: None at this time

## 2021-11-29 ENCOUNTER — Ambulatory Visit (INDEPENDENT_AMBULATORY_CARE_PROVIDER_SITE_OTHER): Payer: Medicare HMO

## 2021-11-29 DIAGNOSIS — Z7901 Long term (current) use of anticoagulants: Secondary | ICD-10-CM | POA: Diagnosis not present

## 2021-11-29 DIAGNOSIS — I10 Essential (primary) hypertension: Secondary | ICD-10-CM

## 2021-11-29 LAB — POCT INR: INR: 1.9 — AB (ref 2.0–3.0)

## 2021-11-29 MED ORDER — WARFARIN SODIUM 2.5 MG PO TABS: ORAL_TABLET | ORAL | 1 refills | Status: DC

## 2021-11-29 MED ORDER — METOPROLOL SUCCINATE ER 50 MG PO TB24: ORAL_TABLET | ORAL | 1 refills | Status: DC

## 2021-11-29 NOTE — Progress Notes (Addendum)
Increase dose today to take 1 1/2 tablets and the continue to take 1 tablet daily except 2 tablets on Mondays only. Re-check in 4 weeks.  Pt also reported he has had difficulty getting warfarin and metoprolol from his pharmacy. Advised pt he needs to make an apt with PCP for his annual physical. Advised both scripts will be sent in and if any further difficulties to contact office. Pt appreciative and verbalized understanding.   Pt made apt for physical. Sent in refills.

## 2021-11-29 NOTE — Progress Notes (Signed)
I have reviewed and agree with note, evaluation, plan.   Shykeria Sakamoto, MD  

## 2021-11-29 NOTE — Patient Instructions (Addendum)
Pre visit review using our clinic review tool, if applicable. No additional management support is needed unless otherwise documented below in the visit note.  Increase dose today to take 1 1/2 tablets and the continue to take 1 tablet daily except 2 tablets on Mondays only. Re-check in 4 weeks.

## 2021-12-04 ENCOUNTER — Other Ambulatory Visit: Payer: Self-pay | Admitting: Family Medicine

## 2021-12-13 ENCOUNTER — Ambulatory Visit (INDEPENDENT_AMBULATORY_CARE_PROVIDER_SITE_OTHER): Payer: Medicare HMO | Admitting: Family Medicine

## 2021-12-13 ENCOUNTER — Encounter: Payer: Self-pay | Admitting: Family Medicine

## 2021-12-13 VITALS — BP 108/56 | HR 76 | Temp 98.0°F | Ht 67.0 in | Wt 200.6 lb

## 2021-12-13 DIAGNOSIS — Z7901 Long term (current) use of anticoagulants: Secondary | ICD-10-CM | POA: Diagnosis not present

## 2021-12-13 DIAGNOSIS — R351 Nocturia: Secondary | ICD-10-CM | POA: Diagnosis not present

## 2021-12-13 DIAGNOSIS — I700 Atherosclerosis of aorta: Secondary | ICD-10-CM | POA: Diagnosis not present

## 2021-12-13 DIAGNOSIS — E785 Hyperlipidemia, unspecified: Secondary | ICD-10-CM

## 2021-12-13 DIAGNOSIS — I10 Essential (primary) hypertension: Secondary | ICD-10-CM

## 2021-12-13 DIAGNOSIS — I2510 Atherosclerotic heart disease of native coronary artery without angina pectoris: Secondary | ICD-10-CM

## 2021-12-13 LAB — LIPID PANEL
Cholesterol: 88 mg/dL (ref 0–200)
HDL: 33.8 mg/dL — ABNORMAL LOW (ref >39.00)
LDL Cholesterol: 27 mg/dL (ref 0–99)
NonHDL: 54.42
Total CHOL/HDL Ratio: 3
Triglycerides: 137 mg/dL (ref 0.0–149.0)
VLDL: 27.4 mg/dL (ref 0.0–40.0)

## 2021-12-13 LAB — COMPREHENSIVE METABOLIC PANEL
ALT: 24 U/L (ref 0–53)
AST: 21 U/L (ref 0–37)
Albumin: 4.2 g/dL (ref 3.5–5.2)
Alkaline Phosphatase: 82 U/L (ref 39–117)
BUN: 16 mg/dL (ref 6–23)
CO2: 29 mEq/L (ref 19–32)
Calcium: 9.7 mg/dL (ref 8.4–10.5)
Chloride: 106 mEq/L (ref 96–112)
Creatinine, Ser: 1.39 mg/dL (ref 0.40–1.50)
GFR: 46.43 mL/min — ABNORMAL LOW (ref >60.00)
Glucose, Bld: 102 mg/dL — ABNORMAL HIGH (ref 70–99)
Potassium: 4.7 mEq/L (ref 3.5–5.1)
Sodium: 141 mEq/L (ref 135–145)
Total Bilirubin: 1.1 mg/dL (ref 0.2–1.2)
Total Protein: 7.5 g/dL (ref 6.0–8.3)

## 2021-12-13 LAB — POC URINALSYSI DIPSTICK (AUTOMATED)
Bilirubin, UA: NEGATIVE
Blood, UA: POSITIVE
Glucose, UA: NEGATIVE
Ketones, UA: NEGATIVE
Nitrite, UA: NEGATIVE
Protein, UA: NEGATIVE
Spec Grav, UA: 1.02 (ref 1.010–1.025)
Urobilinogen, UA: 0.2 E.U./dL
pH, UA: 6 (ref 5.0–8.0)

## 2021-12-13 LAB — CBC WITH DIFFERENTIAL/PLATELET
Basophils Absolute: 0 10*3/uL (ref 0.0–0.1)
Basophils Relative: 0.6 % (ref 0.0–3.0)
Eosinophils Absolute: 0.2 10*3/uL (ref 0.0–0.7)
Eosinophils Relative: 2.8 % (ref 0.0–5.0)
HCT: 43.1 % (ref 39.0–52.0)
Hemoglobin: 14.4 g/dL (ref 13.0–17.0)
Lymphocytes Relative: 37.3 % (ref 12.0–46.0)
Lymphs Abs: 2.7 10*3/uL (ref 0.7–4.0)
MCHC: 33.4 g/dL (ref 30.0–36.0)
MCV: 91.3 fl (ref 78.0–100.0)
Monocytes Absolute: 0.8 10*3/uL (ref 0.1–1.0)
Monocytes Relative: 10.8 % (ref 3.0–12.0)
Neutro Abs: 3.6 10*3/uL (ref 1.4–7.7)
Neutrophils Relative %: 48.5 % (ref 43.0–77.0)
Platelets: 146 10*3/uL — ABNORMAL LOW (ref 150.0–400.0)
RBC: 4.73 Mil/uL (ref 4.22–5.81)
RDW: 13.9 % (ref 11.5–15.5)
WBC: 7.3 10*3/uL (ref 4.0–10.5)

## 2021-12-13 MED ORDER — WARFARIN SODIUM 2.5 MG PO TABS: ORAL_TABLET | ORAL | 3 refills | Status: DC

## 2021-12-13 MED ORDER — METOPROLOL SUCCINATE ER 50 MG PO TB24: ORAL_TABLET | ORAL | 3 refills | Status: DC

## 2021-12-13 MED ORDER — TAMSULOSIN HCL 0.4 MG PO CAPS: ORAL_CAPSULE | ORAL | 3 refills | Status: DC

## 2021-12-13 NOTE — Patient Instructions (Addendum)
Health Maintenance Due  Topic Date Due   Zoster Vaccines- Shingrix (1 of 2)-let us know when you get this at pharmacy Never done   TETANUS/TDAP -let us know when you get this at pharmacy. 11/04/2021  - could consider fall covid shot in fall when they come out with a new one  Please stop by lab before you go If you have mychart- we will send your results within 3 business days of [place] receiving them.  If you do not have mychart- we will call you about results within 5 business days of [place] receiving them.  *please also note that you will see labs on mychart as soon as they post. I will later go in and write notes on them- will say "notes from Dr. Yong Channel"   Glad you are doing well overall!   Recommended follow up: Return in about 6 months (around 06/15/2022) for physical or sooner if needed.Schedule b4 you leave.

## 2021-12-13 NOTE — Progress Notes (Addendum)
Phone [phone] In person visit   Subjective:   Shawn [name] is a 87 y.o. year old very pleasant male patient who presents for/with See problem oriented charting Chief Complaint  Patient presents with   Follow-up   Hypertension   Hyperlipidemia   runny stool    Pt c/o runny stool, he has been eating fresh vegetables lately.    Nocturia    Pt c/o urination at night and at times almost wets his pants.    Past Medical History-  Patient Active Problem List   Diagnosis Date Noted   Complete heart block ([place]) 04/02/2019    Priority: High   CAD, NATIVE VESSEL 09/27/2008    Priority: High   PACEMAKER, PERMANENT 03/11/2007    Priority: High   Atrial fibrillation ([place]) 02/11/2007    Priority: High   Benign paroxysmal positional vertigo of right ear 09/24/2019    Priority: Medium    Status post total right knee replacement 08/11/2019    Priority: Medium    History of adenomatous polyp of colon 09/29/2014    Priority: Medium    BPH associated with nocturia 09/09/2014    Priority: Medium    Hyperlipidemia 03/10/2008    Priority: Medium    Essential hypertension 12/02/2006    Priority: Medium    Aortic atherosclerosis ([place]) 10/28/2018    Priority: Low   Encounter for therapeutic drug monitoring 09/24/2013    Priority: Low   History of thrombocytopenia 06/17/2011    Priority: Low   Hx of skin cancer, basal cell 04/07/2008    Priority: Low   Actinic keratosis 04/07/2008    Priority: Low   Allergic rhinitis 12/02/2006    Priority: Low   GERD 12/02/2006    Priority: Low   Obese 08/12/2019   Benign microscopic hematuria 10/02/2018   Nocturia 10/02/2018   Long term (current) use of anticoagulants 02/18/2017    Medications- reviewed and updated Current Outpatient Medications  Medication Sig Dispense Refill   acetaminophen (TYLENOL) 325 MG tablet Take 325-650 mg by mouth every 6 (six) hours as needed (pain).     atorvastatin (LIPITOR) 40 MG tablet TAKE 1 TABLET  EVERY DAY AT 6PM 90 tablet 1   diltiazem (CARDIZEM CD) 240 MG 24 hr capsule TAKE 1 CAPSULE EVERY DAY 90 capsule 3   finasteride (PROSCAR) 5 MG tablet TAKE 1 TABLET EVERY DAY 90 tablet 2   fluticasone (FLONASE) 50 MCG/ACT nasal spray Place 2 sprays into both nostrils daily. 48 g 3   Multiple Vitamins-Minerals (MULTIVITAMIN & MINERAL PO) Take 1 tablet by mouth every other day.      nitroGLYCERIN (NITROSTAT) 0.4 MG SL tablet Place 1 tablet (0.4 mg total) under the tongue every 5 (five) minutes as needed for chest pain. 25 tablet 1   metoprolol succinate (TOPROL-XL) 50 MG 24 hr tablet TAKE 1 AND 1/2 TABLETS EVERY DAY. TAKE WITH OR IMMEDIATELY FOLLOWING A MEAL. 135 tablet 3   tamsulosin (FLOMAX) 0.4 MG CAPS capsule TAKE 2 CAPSULES EVERY DAY 180 capsule 3   warfarin (COUMADIN) 2.5 MG tablet TAKE 2 TABLET BY MOUTH DAILY OR TAKE AS DIRECTED BY ANTICOAGULATION CLINIC 200 tablet 3   No current facility-administered medications for this visit.     Objective:  BP (!) 108/56   Pulse 76   Temp 98 F (36.7 C)   Ht '5\' 7"'$  (1.702 m)   Wt 200 lb 9.6 oz (91 kg)   SpO2 97%   BMI 31.42 kg/m  Gen: NAD, resting  comfortably CV: RRR no murmurs rubs or gallops Lungs: CTAB no crackles, wheeze, rhonchi Ext: trace edema slight worse on left Skin: warm, dry     Assessment and Plan   #CAD with history of stents #hyperlipidemia #aortic atherosclerosis S: Medication: Atorvastatin '40Mg'$ , Coumadin (not on aspirin as a result) -No chest pain or shortness of breath reported.  Seen by cardiology 07/25/2021.  Has not had to use nitroglycerin Lab Results  Component Value Date   CHOL 88 12/13/2021   HDL 33.80 (L) 12/13/2021   LDLCALC 27 12/13/2021   LDLDIRECT 46.0 09/24/2019   TRIG 137.0 12/13/2021   CHOLHDL 3 12/13/2021   A/P: CAD asymptomatic- continue current meds. Lipids have bene at goal- update lipids and likely continue current meds For aortic atherosclerosis- continue risk factor modification. Presume  stable  # Atrial fibrillation with pacemaker due to history of tachybradycardia and complete heart block syndrome-follows Dr. Lovena Le S: Rate controlled with diltiazem 240 mg extended release, metoprolol 75 mg extended release Anticoagulated with Coumadin-follows with our clinic A/P: atrial fibrillation appropriately anticoagulated and rate controlled-continue current medication.  Complete heart block stable with pacemaker in place-continue to monitor and follow-up with cardiology  #hypertension S: medication: Metoprolol 75 mg extended release, diltiazem 240 mg extended release -Blood pressure low normal today but denies lightheadedness/dizziness BP Readings from Last 3 Encounters:  12/13/21 (!) 108/56  07/25/21 118/64  05/17/21 100/60   A/P:  Controlled. Continue current medications.    #BPH S: Medication: Finasteride 5 mg daily, tamsulosin 0.4 mg daily  -Despite medication he is having nocturia 4-5 x (worsened a few months ago) and also has some  dribbling Lab Results  Component Value Date   PSA 2.8 09/08/2018   PSA 3.14 07/07/2018   PSA 2.20 05/17/2014  A/P: Patient with worsening nocturia and dribbling over the last few months-we have not checked a PSA in several years and he would like to check another 1 of these today.  Also check urinalysis and urine culture to make sure no infection - wants to hold off on rectal today- willing to reconsider in 6 months - wants to hold off on urology consult  #History adenomatous colon polyp 02/17/2016 with possible 5 year follow up- hed prefer to hold off even with slight bowel changes -He has noted some looser stools 1-2x a day (pretty stable)in last 1.5 months-has been eating more fiber/3 more vegetables recently. No blood in stool or melena.   Recommended follow up: Return in about 6 months (around 06/15/2022) for physical or sooner if needed.Schedule b4 you leave. Future Appointments  Date Time Provider [place]  12/27/2021 10:00 AM  [facility]  01/15/2022  8:05 AM [facility] DEVICE REMOTES [facility]  04/16/2022  8:05 AM [facility] DEVICE REMOTES [facility]  06/20/2022  9:00 AM Marin Olp, MD [facility]  07/16/2022  8:05 AM [facility] DEVICE REMOT[facility]  10/15/2022  8:05 AM [facility] DEVICE REMOTES [facility]  11/30/2022  9:00 AM [facility] HEALTH COACH [facility]    Lab/Order associations: some chocolate donuts x2 this morning- reports small   ICD-10-CM   1. Nocturia  R35.1 POCT Urinalysis Dipstick (Automated)    Urine Culture    POCT Urinalysis Dipstick (Automated)    2. Essential hypertension  I10 metoprolol succinate (TOPROL-XL) 50 MG 24 hr tablet    3. Long term (current) use of anticoagulants  Z79.01 warfarin (COUMADIN) 2.5 MG tablet    4. Atherosclerosis of native coronary artery of native  heart without angina pectoris  I25.10     5. Hyperlipidemia, unspecified hyperlipidemia type  E78.5 CBC with Differential/Platelet    Comprehensive metabolic panel    Lipid panel    6. Aortic atherosclerosis (HCC) Chronic I70.0       Meds ordered this encounter  Medications   metoprolol succinate (TOPROL-XL) 50 MG 24 hr tablet    Sig: TAKE 1 AND 1/2 TABLETS EVERY DAY. TAKE WITH OR IMMEDIATELY FOLLOWING A MEAL.    Dispense:  135 tablet    Refill:  3   warfarin (COUMADIN) 2.5 MG tablet    Sig: TAKE 2 TABLET BY MOUTH DAILY OR TAKE AS DIRECTED BY ANTICOAGULATION CLINIC    Dispense:  200 tablet    Refill:  3   tamsulosin (FLOMAX) 0.4 MG CAPS capsule    Sig: TAKE 2 CAPSULES EVERY DAY    Dispense:  180 capsule    Refill:  3    Return precautions advised.  Garret Reddish, MD

## 2021-12-15 ENCOUNTER — Other Ambulatory Visit: Payer: Self-pay

## 2021-12-15 DIAGNOSIS — R319 Hematuria, unspecified: Secondary | ICD-10-CM

## 2021-12-15 LAB — URINE CULTURE
MICRO NUMBER:: 13637167
SPECIMEN QUALITY:: ADEQUATE

## 2021-12-27 ENCOUNTER — Ambulatory Visit (INDEPENDENT_AMBULATORY_CARE_PROVIDER_SITE_OTHER): Payer: Medicare HMO

## 2021-12-27 DIAGNOSIS — Z7901 Long term (current) use of anticoagulants: Secondary | ICD-10-CM

## 2021-12-27 LAB — POCT INR: INR: 1.8 — AB (ref 2.0–3.0)

## 2021-12-27 NOTE — Progress Notes (Addendum)
Pt reports eating a lot more fresh vegetables out of his garden. Increase dose today to take 1 1/2 tablets and the change weekly dose to take 1 tablet daily except 2 tablets on Mondays and Thursdays. Re-check in 3 weeks.

## 2021-12-27 NOTE — Progress Notes (Signed)
I have reviewed and agree with note, evaluation, plan.   Selen Smucker, MD  

## 2021-12-27 NOTE — Patient Instructions (Addendum)
Pre visit review using our clinic review tool, if applicable. No additional management support is needed unless otherwise documented below in the visit note.  Increase dose today to take 1 1/2 tablets and the change weekly dose to take 1 tablet daily except 2 tablets on Mondays and Thursdays. Re-check in 3 weeks.

## 2022-01-04 DIAGNOSIS — R311 Benign essential microscopic hematuria: Secondary | ICD-10-CM | POA: Diagnosis not present

## 2022-01-09 DIAGNOSIS — L814 Other melanin hyperpigmentation: Secondary | ICD-10-CM | POA: Diagnosis not present

## 2022-01-09 DIAGNOSIS — D1801 Hemangioma of skin and subcutaneous tissue: Secondary | ICD-10-CM | POA: Diagnosis not present

## 2022-01-09 DIAGNOSIS — L570 Actinic keratosis: Secondary | ICD-10-CM | POA: Diagnosis not present

## 2022-01-09 DIAGNOSIS — Z08 Encounter for follow-up examination after completed treatment for malignant neoplasm: Secondary | ICD-10-CM | POA: Diagnosis not present

## 2022-01-09 DIAGNOSIS — Z85828 Personal history of other malignant neoplasm of skin: Secondary | ICD-10-CM | POA: Diagnosis not present

## 2022-01-09 DIAGNOSIS — L821 Other seborrheic keratosis: Secondary | ICD-10-CM | POA: Diagnosis not present

## 2022-01-15 ENCOUNTER — Ambulatory Visit: Payer: Medicare HMO

## 2022-01-16 DIAGNOSIS — N200 Calculus of kidney: Secondary | ICD-10-CM | POA: Diagnosis not present

## 2022-01-16 DIAGNOSIS — R311 Benign essential microscopic hematuria: Secondary | ICD-10-CM | POA: Diagnosis not present

## 2022-01-16 DIAGNOSIS — K5730 Diverticulosis of large intestine without perforation or abscess without bleeding: Secondary | ICD-10-CM | POA: Diagnosis not present

## 2022-01-16 LAB — CUP PACEART REMOTE DEVICE CHECK
Battery Impedance: 2886 Ohm
Battery Remaining Longevity: 24 mo
Battery Voltage: 2.72 V
Brady Statistic RV Percent Paced: 96 %
Date Time Interrogation Session: 20230814090920
Implantable Lead Implant Date: 20081007
Implantable Lead Implant Date: 20081007
Implantable Lead Location: 753859
Implantable Lead Location: 753860
Implantable Lead Model: 5076
Implantable Lead Model: 5076
Implantable Pulse Generator Implant Date: 20160714
Lead Channel Impedance Value: 0 Ohm
Lead Channel Impedance Value: 502 Ohm
Lead Channel Pacing Threshold Amplitude: 0.625 V
Lead Channel Pacing Threshold Pulse Width: 0.4 ms
Lead Channel Setting Pacing Amplitude: 2 V
Lead Channel Setting Pacing Pulse Width: 0.4 ms
Lead Channel Setting Sensing Sensitivity: 1.4 mV

## 2022-01-17 ENCOUNTER — Ambulatory Visit (INDEPENDENT_AMBULATORY_CARE_PROVIDER_SITE_OTHER): Payer: Medicare HMO

## 2022-01-17 DIAGNOSIS — Z7901 Long term (current) use of anticoagulants: Secondary | ICD-10-CM

## 2022-01-17 LAB — POCT INR: INR: 2.7 (ref 2.0–3.0)

## 2022-01-17 NOTE — Progress Notes (Addendum)
Continue 1 tablet daily except 2 tablets on Mondays and Thursdays. Recheck in 6 weeks. 

## 2022-01-17 NOTE — Progress Notes (Signed)
I have reviewed and agree with note, evaluation, plan.   Coila Wardell, MD  

## 2022-01-17 NOTE — Patient Instructions (Addendum)
Pre visit review using our clinic review tool, if applicable. No additional management support is needed unless otherwise documented below in the visit note.  Continue 1 tablet daily except 2 tablets on Mondays and Thursdays. Recheck in 6 weeks. 

## 2022-01-29 ENCOUNTER — Other Ambulatory Visit: Payer: Self-pay | Admitting: Family Medicine

## 2022-01-29 DIAGNOSIS — E785 Hyperlipidemia, unspecified: Secondary | ICD-10-CM

## 2022-02-14 DIAGNOSIS — Z961 Presence of intraocular lens: Secondary | ICD-10-CM | POA: Diagnosis not present

## 2022-02-14 DIAGNOSIS — H04123 Dry eye syndrome of bilateral lacrimal glands: Secondary | ICD-10-CM | POA: Diagnosis not present

## 2022-02-14 DIAGNOSIS — H4031X2 Glaucoma secondary to eye trauma, right eye, moderate stage: Secondary | ICD-10-CM | POA: Diagnosis not present

## 2022-02-14 DIAGNOSIS — H353131 Nonexudative age-related macular degeneration, bilateral, early dry stage: Secondary | ICD-10-CM | POA: Diagnosis not present

## 2022-02-14 DIAGNOSIS — H35371 Puckering of macula, right eye: Secondary | ICD-10-CM | POA: Diagnosis not present

## 2022-02-16 DIAGNOSIS — N401 Enlarged prostate with lower urinary tract symptoms: Secondary | ICD-10-CM | POA: Diagnosis not present

## 2022-02-16 DIAGNOSIS — R351 Nocturia: Secondary | ICD-10-CM | POA: Diagnosis not present

## 2022-02-16 DIAGNOSIS — R311 Benign essential microscopic hematuria: Secondary | ICD-10-CM | POA: Diagnosis not present

## 2022-02-28 ENCOUNTER — Ambulatory Visit (INDEPENDENT_AMBULATORY_CARE_PROVIDER_SITE_OTHER): Payer: Medicare HMO

## 2022-02-28 DIAGNOSIS — Z7901 Long term (current) use of anticoagulants: Secondary | ICD-10-CM

## 2022-02-28 LAB — POCT INR: INR: 2.3 (ref 2.0–3.0)

## 2022-02-28 MED ORDER — DILTIAZEM HCL ER COATED BEADS 240 MG PO CP24: 240.0000 mg | ORAL_CAPSULE | Freq: Every day | ORAL | 3 refills | Status: DC

## 2022-02-28 MED ORDER — FLUTICASONE PROPIONATE 50 MCG/ACT NA SUSP: 2.0000 | Freq: Every day | NASAL | 3 refills | Status: DC

## 2022-02-28 NOTE — Progress Notes (Signed)
I have reviewed and agree with note, evaluation, plan.   [name], MD  

## 2022-02-28 NOTE — Progress Notes (Signed)
Continue 1 tablet daily except 2 tablets on Mondays and Thursdays. Re-check in 6 weeks.  Pt also requested refills on Cardizem and Flonase. Pt up to date on visits and no changes to above medications noted upon chart review, refills sent to pharmacy.

## 2022-02-28 NOTE — Patient Instructions (Signed)
Continue 1 tablet daily except 2 tablets on Mondays and Thursdays. Recheck in 6 weeks. 

## 2022-03-19 DIAGNOSIS — Z961 Presence of intraocular lens: Secondary | ICD-10-CM | POA: Diagnosis not present

## 2022-03-19 DIAGNOSIS — H04123 Dry eye syndrome of bilateral lacrimal glands: Secondary | ICD-10-CM | POA: Diagnosis not present

## 2022-03-19 DIAGNOSIS — H4031X2 Glaucoma secondary to eye trauma, right eye, moderate stage: Secondary | ICD-10-CM | POA: Diagnosis not present

## 2022-03-26 ENCOUNTER — Other Ambulatory Visit: Payer: Self-pay | Admitting: Family Medicine

## 2022-04-11 ENCOUNTER — Ambulatory Visit (INDEPENDENT_AMBULATORY_CARE_PROVIDER_SITE_OTHER): Payer: Medicare HMO

## 2022-04-11 DIAGNOSIS — Z7901 Long term (current) use of anticoagulants: Secondary | ICD-10-CM

## 2022-04-11 LAB — POCT INR: INR: 2.8 (ref 2.0–3.0)

## 2022-04-11 NOTE — Progress Notes (Signed)
Continue 1 tablet daily except 2 tablets on Mondays and Thursdays. Recheck in 6 weeks. 

## 2022-04-11 NOTE — Patient Instructions (Addendum)
Continue 1 tablet daily except 2 tablets on Mondays and Thursdays. Recheck in 6 weeks, on 12/27 at 9:30.

## 2022-04-24 ENCOUNTER — Telehealth: Payer: Self-pay | Admitting: Internal Medicine

## 2022-04-24 ENCOUNTER — Ambulatory Visit (INDEPENDENT_AMBULATORY_CARE_PROVIDER_SITE_OTHER): Payer: Medicare HMO

## 2022-04-24 DIAGNOSIS — I442 Atrioventricular block, complete: Secondary | ICD-10-CM | POA: Diagnosis not present

## 2022-04-24 LAB — CUP PACEART REMOTE DEVICE CHECK
Battery Impedance: 3269 Ohm
Battery Remaining Longevity: 20 mo
Battery Voltage: 2.72 V
Brady Statistic RV Percent Paced: 96 %
Date Time Interrogation Session: 20231121091911
Implantable Lead Connection Status: 753985
Implantable Lead Connection Status: 753985
Implantable Lead Implant Date: 20081007
Implantable Lead Implant Date: 20081007
Implantable Lead Location: 753859
Implantable Lead Location: 753860
Implantable Lead Model: 5076
Implantable Lead Model: 5076
Implantable Pulse Generator Implant Date: 20160714
Lead Channel Impedance Value: 0 Ohm
Lead Channel Impedance Value: 475 Ohm
Lead Channel Pacing Threshold Amplitude: 0.75 V
Lead Channel Pacing Threshold Pulse Width: 0.4 ms
Lead Channel Setting Pacing Amplitude: 2 V
Lead Channel Setting Pacing Pulse Width: 0.4 ms
Lead Channel Setting Sensing Sensitivity: 1.4 mV
Zone Setting Status: 755011

## 2022-04-24 NOTE — Telephone Encounter (Signed)
Pt called and wanted to know if his Device transmission was received, and if Dr. Tanna Furry office needed anything?  Per Device clinic the transmission was received 04/24/2022, but has not been read yet.  It appears to be within normal limits.    Pt told if we do not call back, everything looks good.  Pt understood, and stated he has been feeling great, or asymptomatic.

## 2022-04-24 NOTE — Telephone Encounter (Signed)
Patient states someone from Dr. Tanna Furry office called him and left him a message.  I did not see anything documented in Epic.

## 2022-05-14 ENCOUNTER — Ambulatory Visit (INDEPENDENT_AMBULATORY_CARE_PROVIDER_SITE_OTHER): Payer: Medicare HMO | Admitting: Family Medicine

## 2022-05-14 ENCOUNTER — Encounter: Payer: Self-pay | Admitting: Family Medicine

## 2022-05-14 ENCOUNTER — Telehealth: Payer: Self-pay | Admitting: Family Medicine

## 2022-05-14 VITALS — BP 130/60 | HR 91 | Temp 97.9°F | Ht 67.0 in | Wt 208.2 lb

## 2022-05-14 DIAGNOSIS — I10 Essential (primary) hypertension: Secondary | ICD-10-CM

## 2022-05-14 DIAGNOSIS — U071 COVID-19: Secondary | ICD-10-CM

## 2022-05-14 DIAGNOSIS — R0981 Nasal congestion: Secondary | ICD-10-CM

## 2022-05-14 DIAGNOSIS — I480 Paroxysmal atrial fibrillation: Secondary | ICD-10-CM

## 2022-05-14 DIAGNOSIS — E785 Hyperlipidemia, unspecified: Secondary | ICD-10-CM | POA: Diagnosis not present

## 2022-05-14 DIAGNOSIS — I2510 Atherosclerotic heart disease of native coronary artery without angina pectoris: Secondary | ICD-10-CM | POA: Diagnosis not present

## 2022-05-14 LAB — POC COVID19 BINAXNOW: SARS Coronavirus 2 Ag: POSITIVE — AB

## 2022-05-14 MED ORDER — MOLNUPIRAVIR EUA 200MG CAPSULE: 4.0000 | ORAL_CAPSULE | Freq: Two times a day (BID) | ORAL | 0 refills | Status: AC

## 2022-05-14 NOTE — Patient Instructions (Addendum)
Team please log flu shot date and date of 1st shingrix  You are also due for Tetanus Tdap when you are feeling better  Patient with testing confirming covid 19 with first day of covid 19 symptoms : December 6th Vaccination status:prior vaccinations but not this years   Therefore: - recommended patient watch closely for shortness of breath or confusion or worsening symptoms and if those occur patient should contact us immediately or seek care in the emergency department -recommended patient consider purchasing pulse oximeter and if levels 94% or below persistently- seek care at the hospital - Patient needs to self isolate  for at least 5 days since first symptom AND at least 24 hours fever free without fever reducing medications AND have improvement in respiratory symptoms . After 5 days can end self isolation but still needs to wear mask for additional 5 days - can go out tomorrow but needs to wear mask.  -Patient should inform close contacts about exposure (anyone patient been around unmasked for more than 15 minutes)   Since High risk for complications with age and heart-we discussed outpatient therapeutic options including paxlovid  and molnupiravir- both antivirals - patient opted for molnupiravir so we do not have to change cholesterol medicine  Recommended follow up: Return for as needed for new, worsening, persistent symptoms.

## 2022-05-14 NOTE — Telephone Encounter (Signed)
Patient states: - He has been having head congestion and runny nose for 4-5 days - He hasn't tested for covid, nor has any other symptoms  - He has been taking tylenol as needed   Patient requests: -A call with Dr. Yong Channel to see if he can have something sent in to help with sx   I offered patient to schedule an OV for evaluation since he hasn't been seen for this issue. Pt agreed under condition he only see Dr. Yong Channel. Pt has been scheduled for 12*14/23 '@4pm'$ .   Patient would still like to know if he could see PCP earlier. Please advise.

## 2022-05-14 NOTE — Progress Notes (Signed)
Phone [phone] In person visit   Subjective:   Shawn [name] is a 87 y.o. year old very pleasant male patient who presents for/with See problem oriented charting Chief Complaint  Patient presents with   [place]    Pt c/o head and chest congestion that started 5 days ago, he is blowing out a yellow thick mucous.   Past Medical History-  Patient Active Problem List   Diagnosis Date Noted   Complete heart block ([place]) 04/02/2019    Priority: High   CAD, NATIVE VESSEL 09/27/2008    Priority: High   PACEMAKER, PERMANENT 03/11/2007    Priority: High   Atrial fibrillation ([place]) 02/11/2007    Priority: High   Benign paroxysmal positional vertigo of right ear 09/24/2019    Priority: Medium    Status post total right knee replacement 08/11/2019    Priority: Medium    History of adenomatous polyp of colon 09/29/2014    Priority: Medium    BPH associated with nocturia 09/09/2014    Priority: Medium    Hyperlipidemia 03/10/2008    Priority: Medium    Essential hypertension 12/02/2006    Priority: Medium    Aortic atherosclerosis ([place]) 10/28/2018    Priority: Low   Encounter for therapeutic drug monitoring 09/24/2013    Priority: Low   History of thrombocytopenia 06/17/2011    Priority: Low   Hx of skin cancer, basal cell 04/07/2008    Priority: Low   Actinic keratosis 04/07/2008    Priority: Low   Allergic rhinitis 12/02/2006    Priority: Low   GERD 12/02/2006    Priority: Low   Obese 08/12/2019   Benign microscopic hematuria 10/02/2018   Nocturia 10/02/2018   Long term (current) use of anticoagulants 02/18/2017    Medications- reviewed and updated Current Outpatient Medications  Medication Sig Dispense Refill   acetaminophen (TYLENOL) 325 MG tablet Take 325-650 mg by mouth every 6 (six) hours as needed (pain).     atorvastatin (LIPITOR) 40 MG tablet TAKE 1 TABLET EVERY DAY AT 6PM 90 tablet 1   diltiazem (CARDIZEM CD) 240 MG 24 hr capsule Take 1 capsule (240  mg total) by mouth daily. 90 capsule 3   finasteride (PROSCAR) 5 MG tablet TAKE 1 TABLET EVERY DAY 90 tablet 10   fluticasone (FLONASE) 50 MCG/ACT nasal spray Place 2 sprays into both nostrils daily. 48 g 3   metoprolol succinate (TOPROL-XL) 50 MG 24 hr tablet TAKE 1 AND 1/2 TABLETS EVERY DAY. TAKE WITH OR IMMEDIATELY FOLLOWING A MEAL. 135 tablet 3   molnupiravir EUA (LAGEVRIO) 200 mg CAPS capsule Take 4 capsules (800 mg total) by mouth 2 (two) times daily for 5 days. 40 capsule 0   Multiple Vitamins-Minerals (MULTIVITAMIN & MINERAL PO) Take 1 tablet by mouth every other day.      nitroGLYCERIN (NITROSTAT) 0.4 MG SL tablet Place 1 tablet (0.4 mg total) under the tongue every 5 (five) minutes as needed for chest pain. 25 tablet 1   tamsulosin (FLOMAX) 0.4 MG CAPS capsule TAKE 2 CAPSULES EVERY DAY 180 capsule 3   warfarin (COUMADIN) 2.5 MG tablet TAKE 2 TABLET BY MOUTH DAILY OR TAKE AS DIRECTED BY ANTICOAGULATION CLINIC 200 tablet 3   No current facility-administered medications for this visit.     Objective:  BP 130/60   Pulse 91   Temp 97.9 F (36.6 C)   Ht '5\' 7"'$  (1.702 m)   Wt 208 lb 3.2 oz (94.4 kg)   SpO2 98%  BMI 32.61 kg/m  Gen: NAD but does appear fatigued, no cough noted during visit Pharynx with mild erythema, nasal turbinates edematous with clear discharge CV: RRR no murmurs rubs or gallops Lungs: CTAB no crackles, wheeze, rhonchi    Results for orders placed or performed in visit on 05/14/22 (from the past 24 hour(s))  POC COVID-19     Status: Abnormal   Collection Time: 05/14/22  1:49 PM  Result Value Ref Range   SARS Coronavirus 2 Ag Positive (A) Negative       Assessment and Plan   # Covid 19 S: Head and upper chest congestion started 5 -6days ago- Wednesday first day of symptoms.  Blowing out thick yellow mucus. Has some wheezing. No fever. No shortness of breath other than if pushes himself over a long distance. No sore throat.  -last Monday or Tuesday was out  working at his house- was cold out but he was bundled out then started with symptoms.  -did not get covid shot this season but had flu shot A/P: Patient with testing confirming covid 19 with first day of covid 19 symptoms : December 6th Vaccination status:prior vaccinations but not this years   Therefore: - recommended patient watch closely for shortness of breath or confusion or worsening symptoms and if those occur patient should contact us immediately or seek care in the emergency department -recommended patient consider purchasing pulse oximeter and if levels 94% or below persistently- seek care at the hospital - Patient needs to self isolate  for at least 5 days since first symptom AND at least 24 hours fever free without fever reducing medications AND have improvement in respiratory symptoms . After 5 days can end self isolation but still needs to wear mask for additional 5 days - can go out tomorrow but needs to wear mask.  -Patient should inform close contacts about exposure (anyone patient been around unmasked for more than 15 minutes)   Since High risk for complications with age and heart-we discussed outpatient therapeutic options including paxlovid  and molnupiravir- both antivirals - patient opted for molnupiravir so we do not have to change/hold cholesterol medicine   #CAD with history of stents #hyperlipidemia S: Medication: Atorvastatin '40Mg'$ , Coumadin (not on aspirin as a result) -No chest pain, mild shortness of breath with exertion Lab Results  Component Value Date   CHOL 88 12/13/2021   HDL 33.80 (L) 12/13/2021   LDLCALC 27 12/13/2021   LDLDIRECT 46.0 09/24/2019   TRIG 137.0 12/13/2021   CHOLHDL 3 12/13/2021   A/P: CAD-believes shortness of breath is related to current illness and not CAD-we opted to continue current medications, for hyperlipidemia lipids very well-controlled under 55-continue current medications-would even consider 20 mg dose if persistently this  low  # Atrial fibrillation with pacemaker due to history of tachybradycardia and complete heart block syndrome-follows Dr. Lovena Le S: Rate controlled with diltiazem 240 mg extended release, metoprolol 75 mg extended release Anticoagulated with Coumadin A/P:  appropriately anticoagulated and rate controlled- continue current medicine.  Heart rate slightly higher than usual-likely related to acute illness  #hypertension S: medication: Metoprolol 75 mg extended release, diltiazem 240 mg extended release BP Readings from Last 3 Encounters:  05/14/22 130/60  12/13/21 (!) 108/56  07/25/21 118/64   A/P: Reasonably controlled-slightly higher with acute illness. Continue current medications.  Recommended follow up: Return for as needed for new, worsening, persistent symptoms. Future Appointments  Date Time Provider [place]  05/25/2022  7:00 AM [facility]  05/30/2022  9:30 AM [facility]  06/12/2022  3:00 PM Minus Breeding, MD [facility] None  06/20/2022  9:00 AM Marin Olp, MD [facility]  06/25/2022  7:10 AM [facility]  07/26/2022  7:00 AM [facility]  08/27/2022  7:05 AM [facility]  09/27/2022  7:00 AM [facility]  10/30/2022  7:15 AM [facility]  11/30/2022  9:00 AM [facility] CO[facility]   Lab/Order associations:   ICD-10-CM   1. COVID-19  U07.1     2. Nasal congestion  R09.81 POC COVID-19    3. Atherosclerosis of native coronary artery of native heart without angina pectoris  I25.10     4. Paroxysmal atrial fibrillation (HCC)  I48.0     5. Essential hypertension  I10     6. Hyperlipidemia, unspecified hyperlipidemia type  E78.5       Meds ordered this encounter  Medications    molnupiravir EUA (LAGEVRIO) 200 mg CAPS capsule    Sig: Take 4 capsules (800 mg total) by mouth 2 (two) times daily for 5 days.    Dispense:  40 capsule    Refill:  0   Return precautions advised.  Garret Reddish, MD

## 2022-05-17 ENCOUNTER — Ambulatory Visit: Payer: Medicare HMO | Admitting: Family Medicine

## 2022-05-18 NOTE — Progress Notes (Signed)
Remote pacemaker transmission.   

## 2022-05-30 ENCOUNTER — Ambulatory Visit (INDEPENDENT_AMBULATORY_CARE_PROVIDER_SITE_OTHER): Payer: Medicare HMO

## 2022-05-30 DIAGNOSIS — Z7901 Long term (current) use of anticoagulants: Secondary | ICD-10-CM

## 2022-05-30 LAB — POCT INR: INR: 2.6 (ref 2.0–3.0)

## 2022-05-30 NOTE — Progress Notes (Signed)
Continue 1 tablet daily except 2 tablets on Mondays and Thursdays. Recheck in 6 weeks. 

## 2022-05-30 NOTE — Patient Instructions (Signed)
Continue 1 tablet daily except 2 tablets on Mondays and Thursdays. Recheck in 6 weeks, on 07/11/22 at 10:00.  If you need to reschedule, please call the [facility]mad[facility] Clinic at [phone].

## 2022-06-06 DIAGNOSIS — H353131 Nonexudative age-related macular degeneration, bilateral, early dry stage: Secondary | ICD-10-CM | POA: Diagnosis not present

## 2022-06-06 DIAGNOSIS — Z961 Presence of intraocular lens: Secondary | ICD-10-CM | POA: Diagnosis not present

## 2022-06-06 DIAGNOSIS — H04123 Dry eye syndrome of bilateral lacrimal glands: Secondary | ICD-10-CM | POA: Diagnosis not present

## 2022-06-06 DIAGNOSIS — H4031X2 Glaucoma secondary to eye trauma, right eye, moderate stage: Secondary | ICD-10-CM | POA: Diagnosis not present

## 2022-06-09 NOTE — Progress Notes (Deleted)
Cardiology Office Note   Date:  06/09/2022   ID:  ALVINO LECHUGA, DOB Jan 26, 1935, MRN [id number]  PCP:  Marin Olp, MD  Cardiologist:   Minus Breeding, MD   No chief complaint on file.      History of Present Illness: Shawn [name] is a 88 y.o. male who presents for followup of atrial fib. Since I last saw him ***   *** he has done well from a cardiac standpoint.  He says that he does have some problems with diarrhea and constipation.  However, he is not describing any cardiac complaints.  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  His granddaughter works for [place] and  just had a baby.    The patient denies any new symptoms such as chest discomfort, neck or arm discomfort. There has been no new shortness of breath, PND or orthopnea. There have been no reported palpitations, presyncope or syncope.    Past Medical History:  Diagnosis Date   Actinic keratosis    Adenomatous colon polyp    ALLERGIC RHINITIS    Arthritis    CAD, NATIVE VESSEL    a. DES x2 to RCA 2004 b. repeat PTCA to RCA 2008 c. PCI to RCA 2012 non-DES    Cellulitis and abscess of trunk    Diverticulosis    Dysrhythmia    GERD (gastroesophageal reflux disease)    HYPERLIPIDEMIA    HYPERTENSION    OTH MALIG NEOPLASM SKIN OTH\T\UNSPEC PARTS FACE    Permanent atrial fibrillation (HCC)    PONV (postoperative nausea and vomiting)    Presence of permanent cardiac pacemaker    Skin cancer    Tachy-brady syndrome (HCC)    a. s/p MDT dual chamber pacemaker implant complicated by pericardial effusion and micro-perforatation s/p lead revisions    Past Surgical History:  Procedure Laterality Date   BACK SURGERY  2000   "for bulging discs"   CATARACT EXTRACTION W/ INTRAOCULAR LENS  IMPLANT, BILATERAL  2012   CORONARY ANGIOPLASTY WITH STENT PLACEMENT  2008   CORONARY ANGIOPLASTY WITH STENT PLACEMENT  05/14/11   CYSTECTOMY     from right  side of face.   EP IMPLANTABLE DEVICE N/A 12/16/2014   Procedure: PPM Generator Changeout;  Surgeon: Evans Lance, MD;  Location: [facility];  Service: Cardiovascular;  Laterality: N/A;   LEAD REVISION  2008   11 days post initial implant, RA and RV lead revisions for micro-perforation and pericardial effusion by Dr Lovena Le   LEFT HEART CATHETERIZATION WITH CORONARY ANGIOGRAM N/A 05/14/2011   Procedure: LEFT HEART CATHETERIZATION WITH CORONARY ANGIOGRAM;  Surgeon: Hillary Bow, MD;  Location: [facility];  Service: Cardiovascular;  Laterality: N/A;   PACEMAKER INSERTION  2008   MDT dual chamber pacemaker implanted by Dr Percival Spanish   TOTAL KNEE ARTHROPLASTY Right 08/11/2019   Procedure: TOTAL KNEE ARTHROPLASTY;  Surgeon: Paralee Cancel, MD;  Location: [facility];  Service: Orthopedics;  Laterality: Right;  70 mins     Current Outpatient Medications  Medication Sig Dispense Refill   acetaminophen (TYLENOL) 325 MG tablet Take 325-650 mg by mouth every 6 (six) hours as needed (pain).     atorvastatin (LIPITOR) 40 MG tablet TAKE 1 TABLET EVERY DAY AT 6PM 90 tablet 1   diltiazem (CARDIZEM CD) 240 MG 24 hr capsule Take 1 capsule (240 mg total) by mouth daily. [place]  capsule 3   finasteride (PROSCAR) 5 MG tablet TAKE 1 TABLET EVERY DAY 90 tablet 10   fluticasone (FLONASE) 50 MCG/ACT nasal spray Place 2 sprays into both nostrils daily. 48 g 3   metoprolol succinate (TOPROL-XL) 50 MG 24 hr tablet TAKE 1 AND 1/2 TABLETS EVERY DAY. TAKE WITH OR IMMEDIATELY FOLLOWING A MEAL. 135 tablet 3   Multiple Vitamins-Minerals (MULTIVITAMIN & MINERAL PO) Take 1 tablet by mouth every other day.      nitroGLYCERIN (NITROSTAT) 0.4 MG SL tablet Place 1 tablet (0.4 mg total) under the tongue every 5 (five) minutes as needed for chest pain. 25 tablet 1   tamsulosin (FLOMAX) 0.4 MG CAPS capsule TAKE 2 CAPSULES EVERY DAY 180 capsule 3   warfarin (COUMADIN) 2.5 MG tablet TAKE 2 TABLET BY MOUTH DAILY OR TAKE AS DIRECTED BY  ANTICOAGULATION CLINIC 200 tablet 3   No current facility-administered medications for this visit.    Allergies:   Patient has no known allergies.    ROS:  Please see the history of present illness.   Otherwise, review of systems are positive for ***.   All other systems are reviewed and negative.    PHYSICAL EXAM: VS:  There were no vitals taken for this visit. , BMI There is no height or weight on file to calculate BMI. GENERAL:  Well appearing NECK:  No jugular venous distention, waveform within normal limits, carotid upstroke brisk and symmetric, no bruits, no thyromegaly LUNGS:  Clear to auscultation bilaterally CHEST:  Well healed pacemaker function HEART:  PMI not displaced or sustained,S1 and S2 within normal limits, no S3, no S4, no clicks, no rubs, *** murmurs ABD:  Flat, positive bowel sounds normal in frequency in pitch, no bruits, no rebound, no guarding, no midline pulsatile mass, no hepatomegaly, no splenomegaly EXT:  2 plus pulses throughout, no edema, no cyanosis no clubbing     ***GENERAL:  Well appearing NECK:  No jugular venous distention, waveform within normal limits, carotid upstroke brisk and symmetric, no bruits, no thyromegaly LUNGS:  Clear to auscultation bilaterally CHEST:  Well healed pacer pocket HEART:  PMI not displaced or sustained,S1 and S2 within normal limits, no S3, no clicks, no rubs, no murmurs, irregular ABD:  Flat, positive bowel sounds normal in frequency in pitch, no bruits, no rebound, no guarding, no midline pulsatile mass, no hepatomegaly, no splenomegaly EXT:  2 plus pulses throughout, mild left greater than right leg edema, no cyanosis no clubbing   EKG:  EKG is *** ordered today. Atrial fibrillation with ventricular pacing 100% capture rate ***  Recent Labs: 12/13/2021: ALT 24; BUN 16; Creatinine, Ser 1.39; Hemoglobin 14.4; Platelets 146.0; Potassium 4.7; Sodium 141    Lipid Panel    Component Value Date/Time   CHOL 88  12/13/2021 1127   CHOL 109 04/16/2017 0923   TRIG 137.0 12/13/2021 1127   HDL 33.80 (L) 12/13/2021 1127   HDL 39 (L) 04/16/2017 0923   CHOLHDL 3 12/13/2021 1127   VLDL 27.4 12/13/2021 1127   LDLCALC 27 12/13/2021 1127   LDLCALC 48 04/16/2017 0923   LDLDIRECT 46.0 09/24/2019 1554      Wt Readings from Last 3 Encounters:  05/14/22 208 lb 3.2 oz (94.4 kg)  12/13/21 200 lb 9.6 oz (91 kg)  07/25/21 206 lb 12.8 oz (93.8 kg)      Other studies Reviewed: Additional studies/ records that were reviewed today include: ***. Review of the above records demonstrates:  Please see elsewhere in the  note.     ASSESSMENT AND PLAN:   ATRIAL FIBRILLATION -  [name] has a CHA2DS2 - VASc score of 4.  ***  He tolerates this.  He feels no palpitations.  Tolerates anticoagulation.  No change in therapy.   CAD, NATIVE VESSEL -  He had a stress perfusion study in 2012 .  ***  No change in therapy.    PACEMAKER, PERMANENT -  He had normal device function in November.  I reivewed this report for this visit.  *** He had follow up with Dr. Lovena Le in December last year.  I did review the most recent interrogation.  He is up-to-date and he has normal function.   DYSLIPIDEMIA - LDL was *** 37.  No change in therapy.   HTN - His blood pressure is *** at target.  No change in therapy.    Current medicines are reviewed at length with the patient today.  The patient does not have concerns regarding medicines.  The following changes have been made:  ***  Labs/ tests ordered today include:  ***  No orders of the defined types were placed in this encounter.     Disposition:   FU with me in me in *** months.     Signed, Minus Breeding, MD  06/09/2022 6:24 PM    [place]

## 2022-06-12 ENCOUNTER — Ambulatory Visit: Payer: Medicare HMO | Admitting: Cardiology

## 2022-06-12 DIAGNOSIS — E785 Hyperlipidemia, unspecified: Secondary | ICD-10-CM

## 2022-06-12 DIAGNOSIS — I10 Essential (primary) hypertension: Secondary | ICD-10-CM

## 2022-06-12 DIAGNOSIS — I480 Paroxysmal atrial fibrillation: Secondary | ICD-10-CM

## 2022-06-12 DIAGNOSIS — I2510 Atherosclerotic heart disease of native coronary artery without angina pectoris: Secondary | ICD-10-CM

## 2022-06-20 ENCOUNTER — Telehealth: Payer: Self-pay

## 2022-06-20 ENCOUNTER — Encounter: Payer: Self-pay | Admitting: Family Medicine

## 2022-06-20 ENCOUNTER — Ambulatory Visit (INDEPENDENT_AMBULATORY_CARE_PROVIDER_SITE_OTHER): Payer: Medicare HMO | Admitting: Family Medicine

## 2022-06-20 VITALS — BP 128/76 | HR 90 | Temp 97.1°F | Ht 67.0 in | Wt 201.4 lb

## 2022-06-20 DIAGNOSIS — I480 Paroxysmal atrial fibrillation: Secondary | ICD-10-CM

## 2022-06-20 DIAGNOSIS — I700 Atherosclerosis of aorta: Secondary | ICD-10-CM | POA: Diagnosis not present

## 2022-06-20 DIAGNOSIS — I442 Atrioventricular block, complete: Secondary | ICD-10-CM | POA: Diagnosis not present

## 2022-06-20 DIAGNOSIS — E785 Hyperlipidemia, unspecified: Secondary | ICD-10-CM

## 2022-06-20 DIAGNOSIS — J40 Bronchitis, not specified as acute or chronic: Secondary | ICD-10-CM

## 2022-06-20 DIAGNOSIS — Z0001 Encounter for general adult medical examination with abnormal findings: Secondary | ICD-10-CM | POA: Diagnosis not present

## 2022-06-20 DIAGNOSIS — B9689 Other specified bacterial agents as the cause of diseases classified elsewhere: Secondary | ICD-10-CM

## 2022-06-20 DIAGNOSIS — J329 Chronic sinusitis, unspecified: Secondary | ICD-10-CM

## 2022-06-20 LAB — COMPREHENSIVE METABOLIC PANEL
ALT: 23 U/L (ref 0–53)
AST: 21 U/L (ref 0–37)
Albumin: 4.1 g/dL (ref 3.5–5.2)
Alkaline Phosphatase: 76 U/L (ref 39–117)
BUN: 13 mg/dL (ref 6–23)
CO2: 30 mEq/L (ref 19–32)
Calcium: 8.8 mg/dL (ref 8.4–10.5)
Chloride: 105 mEq/L (ref 96–112)
Creatinine, Ser: 0.96 mg/dL (ref 0.40–1.50)
GFR: 72.12 mL/min (ref >60.00)
Glucose, Bld: 105 mg/dL — ABNORMAL HIGH (ref 70–99)
Potassium: 4.4 mEq/L (ref 3.5–5.1)
Sodium: 141 mEq/L (ref 135–145)
Total Bilirubin: 0.9 mg/dL (ref 0.2–1.2)
Total Protein: 7.3 g/dL (ref 6.0–8.3)

## 2022-06-20 LAB — POCT RESPIRATORY SYNCYTIAL VIRUS: RSV Rapid Ag: NEGATIVE

## 2022-06-20 LAB — CBC WITH DIFFERENTIAL/PLATELET
Basophils Absolute: 0 10*3/uL (ref 0.0–0.1)
Basophils Relative: 0.4 % (ref 0.0–3.0)
Eosinophils Absolute: 0.1 10*3/uL (ref 0.0–0.7)
Eosinophils Relative: 1.7 % (ref 0.0–5.0)
HCT: 42.2 % (ref 39.0–52.0)
Hemoglobin: 14.6 g/dL (ref 13.0–17.0)
Lymphocytes Relative: 39.6 % (ref 12.0–46.0)
Lymphs Abs: 2.7 10*3/uL (ref 0.7–4.0)
MCHC: 34.6 g/dL (ref 30.0–36.0)
MCV: 87.8 fl (ref 78.0–100.0)
Monocytes Absolute: 0.5 10*3/uL (ref 0.1–1.0)
Monocytes Relative: 7.3 % (ref 3.0–12.0)
Neutro Abs: 3.5 10*3/uL (ref 1.4–7.7)
Neutrophils Relative %: 51 % (ref 43.0–77.0)
Platelets: 113 10*3/uL — ABNORMAL LOW (ref 150.0–400.0)
RBC: 4.81 Mil/uL (ref 4.22–5.81)
RDW: 14.6 % (ref 11.5–15.5)
WBC: 6.9 10*3/uL (ref 4.0–10.5)

## 2022-06-20 LAB — POCT INFLUENZA A/B
Influenza A, POC: NEGATIVE
Influenza B, POC: NEGATIVE

## 2022-06-20 LAB — POC COVID19 BINAXNOW: SARS Coronavirus 2 Ag: NEGATIVE

## 2022-06-20 LAB — LDL CHOLESTEROL, DIRECT: Direct LDL: 35 mg/dL

## 2022-06-20 MED ORDER — DOXYCYCLINE HYCLATE 100 MG PO TABS: 100.0000 mg | ORAL_TABLET | Freq: Two times a day (BID) | ORAL | 0 refills | Status: AC

## 2022-06-20 MED ORDER — ALBUTEROL SULFATE HFA 108 (90 BASE) MCG/ACT IN AERS: 2.0000 | INHALATION_SPRAY | Freq: Four times a day (QID) | RESPIRATORY_TRACT | 0 refills | Status: AC | PRN

## 2022-06-20 NOTE — Progress Notes (Signed)
Phone: [phone]   Subjective:  Patient presents today for their annual physical. Chief complaint-noted.   See problem oriented charting- ROS- full  review of systems was completed and negative  except for: runny nose, soret throat, cough, whezing, some eye redness but sees eye doctor, decreased stream, headaches - thinks several of these symptoms related to illness starting tuesday  The following were reviewed and entered/updated in epic: Past Medical History:  Diagnosis Date   Actinic keratosis    Adenomatous colon polyp    ALLERGIC RHINITIS    Arthritis    CAD, NATIVE VESSEL    a. DES x2 to RCA 2004 b. repeat PTCA to RCA 2008 c. PCI to RCA 2012 non-DES    Cellulitis and abscess of trunk    Diverticulosis    Dysrhythmia    GERD (gastroesophageal reflux disease)    HYPERLIPIDEMIA    HYPERTENSION    OTH MALIG NEOPLASM SKIN OTH\T\UNSPEC PARTS FACE    Permanent atrial fibrillation (HCC)    PONV (postoperative nausea and vomiting)    Presence of permanent cardiac pacemaker    Skin cancer    Tachy-brady syndrome (HCC)    a. s/p MDT dual chamber pacemaker implant complicated by pericardial effusion and micro-perforatation s/p lead revisions   Patient Active Problem List   Diagnosis Date Noted   Complete heart block ([place]) 04/02/2019    Priority: High   CAD, NATIVE VESSEL 09/27/2008    Priority: High   PACEMAKER, PERMANENT 03/11/2007    Priority: High   Atrial fibrillation ([place]) 02/11/2007    Priority: High   Benign paroxysmal positional vertigo of right ear 09/24/2019    Priority: Medium    Status post total right knee replacement 08/11/2019    Priority: Medium    History of adenomatous polyp of colon 09/29/2014    Priority: Medium    BPH associated with nocturia 09/09/2014    Priority: Medium    Hyperlipidemia 03/10/2008    Priority: Medium    Essential hypertension 12/02/2006    Priority: Medium    Aortic atherosclerosis ([place]) 10/28/2018    Priority: Low    Long term (current) use of anticoagulants 02/18/2017    Priority: Low   Encounter for therapeutic drug monitoring 09/24/2013    Priority: Low   History of thrombocytopenia 06/17/2011    Priority: Low   Hx of skin cancer, basal cell 04/07/2008    Priority: Low   Actinic keratosis 04/07/2008    Priority: Low   Allergic rhinitis 12/02/2006    Priority: Low   GERD 12/02/2006    Priority: Low   Benign microscopic hematuria 10/02/2018   Past Surgical History:  Procedure Laterality Date   BACK SURGERY  2000   "for bulging discs"   CATARACT EXTRACTION W/ INTRAOCULAR LENS  IMPLANT, BILATERAL  2012   CORONARY ANGIOPLASTY WITH STENT PLACEMENT  2008   CORONARY ANGIOPLASTY WITH STENT PLACEMENT  05/14/11   CYSTECTOMY     from right side of face.   EP IMPLANTABLE DEVICE N/A 12/16/2014   Procedure: PPM Generator Changeout;  Surgeon: Evans Lance, MD;  Location: [facility];  Service: Cardiovascular;  Laterality: N/A;   LEAD REVISION  2008   11 days post initial implant, RA and RV lead revisions for micro-perforation and pericardial effusion by Dr Lovena Le   LEFT HEART CATHETERIZATION WITH CORONARY ANGIOGRAM N/A 05/14/2011   Procedure: LEFT HEART CATHETERIZATION WITH CORONARY ANGIOGRAM;  Surgeon: Hillary Bow, MD;  Location: [facility] CATH LAB;  Service: Cardiovascular;  Laterality: N/A;   PACEMAKER INSERTION  2008   MDT dual chamber pacemaker implanted by Dr Percival Spanish   TOTAL KNEE ARTHROPLASTY Right 08/11/2019   Procedure: TOTAL KNEE ARTHROPLASTY;  Surgeon: Paralee Cancel, MD;  Location: [facility];  Service: Orthopedics;  Laterality: Right;  70 mins    Family History  Problem Relation Age of Onset   Ovarian cancer Mother    Heart attack Father    Colon cancer Father 59       deceased 33   Diabetes Brother        x 2   Heart disease Brother        x 3    Medications- reviewed and updated Current Outpatient Medications  Medication Sig Dispense Refill   acetaminophen (TYLENOL) 325 MG  tablet Take 325-650 mg by mouth every 6 (six) hours as needed (pain).     atorvastatin (LIPITOR) 40 MG tablet TAKE 1 TABLET EVERY DAY AT 6PM 90 tablet 1   diltiazem (CARDIZEM CD) 240 MG 24 hr capsule Take 1 capsule (240 mg total) by mouth daily. 90 capsule 3   finasteride (PROSCAR) 5 MG tablet TAKE 1 TABLET EVERY DAY 90 tablet 10   fluticasone (FLONASE) 50 MCG/ACT nasal spray Place 2 sprays into both nostrils daily. 48 g 3   metoprolol succinate (TOPROL-XL) 50 MG 24 hr tablet TAKE 1 AND 1/2 TABLETS EVERY DAY. TAKE WITH OR IMMEDIATELY FOLLOWING A MEAL. 135 tablet 3   Multiple Vitamins-Minerals (MULTIVITAMIN & MINERAL PO) Take 1 tablet by mouth every other day.      nitroGLYCERIN (NITROSTAT) 0.4 MG SL tablet Place 1 tablet (0.4 mg total) under the tongue every 5 (five) minutes as needed for chest pain. 25 tablet 1   tamsulosin (FLOMAX) 0.4 MG CAPS capsule TAKE 2 CAPSULES EVERY DAY 180 capsule 3   warfarin (COUMADIN) 2.5 MG tablet TAKE 2 TABLET BY MOUTH DAILY OR TAKE AS DIRECTED BY ANTICOAGULATION CLINIC 200 tablet 3   No current facility-administered medications for this visit.    Allergies-reviewed and updated No Known Allergies  Social History   Social History Narrative   Widowed March 2016. 2 sons. 4 grandkids- all through college. 1 greatgrandchild on way 11/2015.    Lives by himself.       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   Objective  Objective:  BP 128/76   Pulse 90   Temp (!) 97.1 F (36.2 C)   Ht '5\' 7"'$  (1.702 m)   Wt 201 lb 6.4 oz (91.4 kg)   SpO2 98%   BMI 31.54 kg/m  Gen: NAD, resting comfortably HEENT: Mucous membranes are moist- mild erythema in pharynx.  Nasal turbinates mildly erythematous with minimal drainage.  Minimal maxillary and frontal sinus tenderness Neck: no thyromegaly CV: irregularly irregular  Lungs: Diffuse wheeze  and rhonchi Abdomen: soft/nontender/nondistended/normal bowel sounds. No rebound or guarding.  Ext: no edema Skin: warm, dry Neuro: grossly normal, moves all extremities, PERRLA   Results for orders placed or performed in visit on 06/20/22 (from the past 24 hour(s))  POCT respiratory syncytial virus     Status: None   Collection Time: 06/20/22  9:47 AM  Result Value Ref Range   RSV Rapid Ag neg   POCT Influenza A/B     Status: None   Collection Time: 06/20/22  9:48 AM  Result Value Ref Range   Influenza A, POC Negative Negative   Influenza B, POC Negative Negative  POC COVID-19     Status: None   Collection Time: 06/20/22  9:48 AM  Result Value Ref Range   SARS Coronavirus 2 Ag Negative Negative      Assessment and Plan  88 y.o. male presenting for annual physical.  Health Maintenance counseling: 1. Anticipatory guidance: Patient counseled regarding regular dental exams -q6 months, eye exams -yearly with Dr. Rachael Fee office- working on red eyes on drops,  avoiding smoking and second hand smoke , limiting alcohol to 2 beverages per day - doesn't drink, no illicit drugs.   2. Risk factor reduction:  Advised patient of need for regular exercise and diet rich and fruits and vegetables to reduce risk of heart attack and stroke.  Exercise- trying to walk regularly and still able to do his yardwork in spring and summer.  Diet/weight management-weight down 3 pounds from last physical- reaosnably healthy diet- mild weight loss ideal .  Wt Readings from Last 3 Encounters:  06/20/22 201 lb 6.4 oz (91.4 kg)  05/14/22 208 lb 3.2 oz (94.4 kg)  12/13/21 200 lb 9.6 oz (91 kg)  3. Immunizations/screenings/ancillary studies-just had COVID in December 2023- wonder about rebound today- hold off on vaccine, recommended Prevnar 20 with overall health- hold off as currently sick, also due for Tdap at pharmacy  Immunization History  Administered Date(s) Administered   Fluad Quad(high Dose 65+)  04/01/2019, 03/28/2020, 03/31/2021   Influenza Split 03/24/2012   Influenza Whole 03/10/2008, 03/16/2009, 04/05/2010   Influenza, High Dose Seasonal PF 04/02/2016, 04/08/2017, 03/19/2018, 03/12/2022   Influenza,inj,Quad PF,6+ Mos 03/30/2013, 03/16/2014, 03/11/2015   PFIZER(Purple Top)SARS-COV-2 Vaccination 06/09/2019, 07/15/2019, 04/22/2020   Pneumococcal Conjugate-13 09/09/2014   Pneumococcal Polysaccharide-23 11/05/2011   Tdap 11/05/2011   Zoster Recombinat (Shingrix) 03/15/2022, 06/08/2022  4. Prostate cancer screening- past age based screening recommendations  5. Colon cancer screening - history of adenomatous colon polyp 02/17/2016 with 5-year follow-up given as an option-patient opted out 6. Skin cancer screening-has seen dermatology 6 months ago  and they are seeing him every 6 months. advised regular sunscreen use. Denies worrisome, changing, or new skin lesions.  7. Smoking associated screening (lung cancer screening, AAA screen 65-75, UA)-never smoker 8. STD screening - he was dating a friend at last physical-STD panel negative at last visit.  Recommended always using condoms.  Today he reports opts out- they are monogomous   Status of chronic or acute concerns   # Wheezing S: Reports wheezing starting a week ago on Tuesday night- coughing and some upper chest congestion.  No history of asthma or COPD. he had covid in December but healed completely. Slight sinus pressure. Has had nasal discharge but improving.  Not improving. No fever. Does have runny nose and sore throat with some cough. Mild eye redness. Headaches with this A/P: Given patient's age and overall health he is high risk for respiratory illness.  Flu, COVID, RSV negative today.  This has a very strong chance of being viral sinusitis-we discussed in case it was bacterial that I wanted to treat with doxycycline to cover his lungs-he has also had some sinus pressure and discharge and this should help potential bacterial  sinusitis as well (he is on day 9 without substantial improvement so I think probability of bacterial sinusitis is higher).  I will send him an inhaler as well -I will inform Coumadin clinic with starting doxycycline for 7  days  -I want him to follow-up with me in the next week if symptoms fail to improve or worsen  #CAD with history of stents #hyperlipidemia #aortic atherosclerosis S: Medication: Atorvastatin '40Mg'$ , Coumadin (not on aspirin as a result) -No chest pain or shortness of breath reported even with recent illness.  Has nitroglycerin on hand but does not have to use Lab Results  Component Value Date   CHOL 88 12/13/2021   HDL 33.80 (L) 12/13/2021   LDLCALC 27 12/13/2021   LDLDIRECT 46.0 09/24/2019   TRIG 137.0 12/13/2021   CHOLHDL 3 12/13/2021    A/P: CAD asymptomatic-continue current medications Lipids at goal with LDL under 70-offered lipid panel versus direct LDL and patient  Aortic atherosclerosis (presumed stable- at goal on LDL- continue current medications   # Atrial fibrillation with pacemaker due to history of tachybradycardia and complete heart block syndrome-follows Dr. Lovena Le S: Rate controlled with diltiazem 240 mg extended release, metoprolol 75 mg extended release Anticoagulated with Coumadin-follows with [facility] A/P: heart block doing well with pacemaker and with current meds. A fib-  appropriately anticoagulated and rate controlled- continue current medicine  #hypertension S: medication: Metoprolol 75 mg extended release, diltiazem 240 mg extended release BP Readings from Last 3 Encounters:  06/20/22 128/76  05/14/22 130/60  12/13/21 (!) 108/56   A/P: stable- continue current medicines   #BPH S: Medication: Finasteride 5 mg daily, tamsulosin 0.8 mg daily. Overal stable  A/P: reasonably Controlled. Continue current medications.  #Osteoarthritis-doing well after right knee replacement march 2021  #History of BPPV- right ear in the past- no  recent issues   Recommended follow up: Return in about 6 months (around 12/19/2022) for followup or sooner if needed.Schedule b4 you leave. Future Appointments  Date Time Provider [place]  06/28/2022  2:00 PM Minus Breeding, MD [facility] None  07/11/2022 10:00 AM [facility]  07/24/2022  7:00 AM [facility] DEVICE REMOTES [facility]  10/23/2022  7:00 AM [facility] DEVICE REMOTES [facility]  11/30/2022  9:00 AM [facility] HEALTH COACH [facility]  01/22/2023  7:00 AM [facility] DEVICE REMOTES [facility]  04/23/2023  7:00 AM [facility] DEVICE REMOTES [facility]  07/23/2023  7:00 AM [facility] DEVICE REMOTES [facility]  10/22/2023  7:00 AM [facility] DEVICE REMOTES [facility]  01/21/2024  7:00 AM [facility] DEVICE REMOTES [facility]  04/21/2024  7:00 AM [facility] DEVICE REMOTES [facility]   Lab/Order associations:NOT fasting- 2 small donuts   ICD-10-CM   1. Preventative health care  Z00.00     2. Complete heart block (HCC)  I44.2     3. Paroxysmal atrial fibrillation (HCC)  I48.0     4. Hyperlipidemia, unspecified hyperlipidemia type  E78.5     5. Aortic atherosclerosis ([place])  I70.0      Return precautions advised.  Garret Reddish, MD

## 2022-06-20 NOTE — Telephone Encounter (Signed)
PCP placed pt on doxycycline today for URI. This interacts with pt's warfarin. Will need to assess INR in 1 week.   Contacted pt and advised need to assess warfarin next week. Pt agreed to apt next week for coumadin clinic. Advised pt if any s/s of bleeding or abnormal bruising to contact the office or go to the ER. Pt verbalized understanding.

## 2022-06-20 NOTE — Patient Instructions (Addendum)
Due for tetanus shot at pharmacy when you are feeling better  Given patient's age and overall health he is high risk for respiratory illness.  Flu, COVID, RSV negative today.  This has a very strong chance of being viral sinusitis-we discussed in case it was bacterial that I wanted to treat with doxycycline to cover his lungs-he has also had some sinus pressure and discharge and this should help potential bacterial sinusitis as well (he is on day 9 without substantial improvement so I think probability of bacterial sinusitis is higher).  I will send him an inhaler as well -I will inform Coumadin clinic with starting doxycycline for 7 days -I want him to follow-up with me in the next week if symptoms fail to improve or worsen   Please stop by lab before you go If you have mychart- we will send your results within 3 business days of [place] receiving them.  If you do not have mychart- we will call you about results within 5 business days of [place] receiving them.  *please also note that you will see labs on mychart as soon as they post. I will later go in and write notes on them- will say "notes from Dr. Yong Channel"   Recommended follow up: No follow-ups on file.

## 2022-06-26 NOTE — Progress Notes (Unsigned)
Cardiology Office Note   Date:  06/28/2022   ID:  Shawn [name], Shawn [name] Shawn [name], 1936, MRN [id number]  PCP:  Shawn Olp, MD  Cardiologist:   Shawn Breeding, MD   No chief complaint on file.      History of Present Illness: Shawn [name] is a 88 y.o. male who presents for followup of atrial fib. Since I last saw him he has had no new cardiovascular complaints.  He says he needs to get a new tractor.  His granddaughter Shawn [name] works for [place].  The patient denies any new symptoms such as chest discomfort, neck or arm discomfort. There has been no new shortness of breath, PND or orthopnea. There have been no reported palpitations, presyncope or syncope.    Past Medical History:  Diagnosis Date   Actinic keratosis    Adenomatous colon polyp    ALLERGIC RHINITIS    Arthritis    CAD, NATIVE VESSEL    a. DES x2 to RCA 2004 b. repeat PTCA to RCA 2008 c. PCI to RCA 2012 non-DES    Cellulitis and abscess of trunk    Diverticulosis    Dysrhythmia    GERD (gastroesophageal reflux disease)    HYPERLIPIDEMIA    HYPERTENSION    OTH MALIG NEOPLASM SKIN OTH\T\UNSPEC PARTS FACE    Permanent atrial fibrillation (HCC)    PONV (postoperative nausea and vomiting)    Presence of permanent cardiac pacemaker    Skin cancer    Tachy-brady syndrome (HCC)    a. s/p MDT dual chamber pacemaker implant complicated by pericardial effusion and micro-perforatation s/p lead revisions    Past Surgical History:  Procedure Laterality Date   BACK SURGERY  2000   "for bulging discs"   CATARACT EXTRACTION W/ INTRAOCULAR LENS  IMPLANT, BILATERAL  2012   CORONARY ANGIOPLASTY WITH STENT PLACEMENT  2008   CORONARY ANGIOPLASTY WITH STENT PLACEMENT  05/14/11   CYSTECTOMY     from right side of face.   EP IMPLANTABLE DEVICE N/A 12/16/2014   Procedure: PPM Generator Changeout;  Surgeon: Evans Lance, MD;  Location: [facility];  Service: Cardiovascular;  Laterality: N/A;   LEAD REVISION  2008    11 days post initial implant, RA and RV lead revisions for micro-perforation and pericardial effusion by Dr Lovena Le   LEFT HEART CATHETERIZATION WITH CORONARY ANGIOGRAM N/A 05/14/2011   Procedure: LEFT HEART CATHETERIZATION WITH CORONARY ANGIOGRAM;  Surgeon: Hillary Bow, MD;  Location: [facility];  Service: Cardiovascular;  Laterality: N/A;   PACEMAKER INSERTION  2008   MDT dual chamber pacemaker implanted by Dr Percival Spanish   TOTAL KNEE ARTHROPLASTY Right 08/11/2019   Procedure: TOTAL KNEE ARTHROPLASTY;  Surgeon: Paralee Cancel, MD;  Location: [facility];  Service: Orthopedics;  Laterality: Right;  70 mins     Current Outpatient Medications  Medication Sig Dispense Refill   acetaminophen (TYLENOL) 325 MG tablet Take 325-650 mg by mouth every 6 (six) hours as needed (pain).     albuterol (VENTOLIN HFA) 108 (90 Base) MCG/ACT inhaler Inhale 2 puffs into the lungs every 6 (six) hours as needed for wheezing or shortness of breath (With acute illness. no asthma or COPD history). 1 each 0   atorvastatin (LIPITOR) 40 MG tablet TAKE 1 TABLET EVERY DAY AT 6PM 90 tablet 1   diltiazem (CARDIZEM CD) 240 MG 24 hr capsule Take 1 capsule (240 mg total) by mouth daily. 90 capsule 3   finasteride (PROSCAR)  5 MG tablet TAKE 1 TABLET EVERY DAY 90 tablet 10   fluticasone (FLONASE) 50 MCG/ACT nasal spray Place 2 sprays into both nostrils daily. 48 g 3   metoprolol succinate (TOPROL-XL) 50 MG 24 hr tablet TAKE 1 AND 1/2 TABLETS EVERY DAY. TAKE WITH OR IMMEDIATELY FOLLOWING A MEAL. 135 tablet 3   Multiple Vitamins-Minerals (MULTIVITAMIN & MINERAL PO) Take 1 tablet by mouth every other day.      nitroGLYCERIN (NITROSTAT) 0.4 MG SL tablet Place 1 tablet (0.4 mg total) under the tongue every 5 (five) minutes as needed for chest pain. 25 tablet 1   tamsulosin (FLOMAX) 0.4 MG CAPS capsule TAKE 2 CAPSULES EVERY DAY 180 capsule 3   warfarin (COUMADIN) 2.5 MG tablet TAKE 2 TABLET BY MOUTH DAILY OR TAKE AS DIRECTED BY  ANTICOAGULATION CLINIC 200 tablet 3   No current facility-administered medications for this visit.    Allergies:   Patient has no known allergies.    ROS:  Please see the history of present illness.   Otherwise, review of systems are positive for none.   All other systems are reviewed and negative.    PHYSICAL EXAM: VS:  BP (!) 112/54 (BP Location: Left Arm, Patient Position: Sitting, Cuff Size: Normal)   Pulse 90   Ht '5\' 7"'$  (1.702 m)   Wt 202 lb (91.6 kg)   SpO2 96%   BMI 31.64 kg/m  , BMI Body mass index is 31.64 kg/m. GENERAL:  Well appearing NECK:  No jugular venous distention, waveform within normal limits, carotid upstroke brisk and symmetric, no bruits, no thyromegaly LUNGS:  Clear to auscultation bilaterally CHEST:  Well healed pacer pocket.  HEART:  PMI not displaced or sustained,S1 and S2 within normal limits, no S3, no clicks, no rubs, no murmurs ABD:  Flat, positive bowel sounds normal in frequency in pitch, no bruits, no rebound, no guarding, no midline pulsatile mass, no hepatomegaly, no splenomegaly EXT:  2 plus pulses throughout, no edema, no cyanosis no clubbing    EKG:  EKG is  ordered today. Atrial fibrillation with ventricular pacing 100% capture rate 90  Recent Labs: 06/20/2022: ALT 23; BUN 13; Creatinine, Ser 0.96; Hemoglobin 14.6; Platelets 113.0; Potassium 4.4; Sodium 141    Lipid Panel    Component Value Date/Time   CHOL 88 12/13/2021 1127   CHOL 109 04/16/2017 0923   TRIG 137.0 12/13/2021 1127   HDL 33.80 (L) 12/13/2021 1127   HDL 39 (L) 04/16/2017 0923   CHOLHDL 3 12/13/2021 1127   VLDL 27.4 12/13/2021 1127   LDLCALC 27 12/13/2021 1127   LDLCALC 48 04/16/2017 0923   LDLDIRECT 35.0 06/20/2022 1007      Wt Readings from Last 3 Encounters:  06/28/22 202 lb (91.6 kg)  06/20/22 201 lb 6.4 oz (91.4 kg)  05/14/22 208 lb 3.2 oz (94.4 kg)      Other studies Reviewed: Additional studies/ records that were reviewed today include:  Labs. Review of the above records demonstrates:  Please see elsewhere in the note.     ASSESSMENT AND PLAN:   ATRIAL FIBRILLATION -  [name] has a CHA2DS2 - VASc score of 4.  He tolerates anticoagulation.  No change in therapy.  CAD, NATIVE VESSEL -  He had a stress perfusion study in 2012 .  No change in therapy.  PACEMAKER, PERMANENT -  He had normal device function in November.  I reivewed this report for this visit.    DYSLIPIDEMIA - LDL was  27 with an HDL of 33.8.  No change in therapy.   HTN - His blood pressure is at target.  No change in therapy.    Current medicines are reviewed at length with the patient today.  The patient does not have concerns regarding medicines.  The following changes have been made:  None  Labs/ tests ordered today include:  None  Orders Placed This Encounter  Procedures   EKG 12-Lead      Disposition:   FU with me in me in 12 months.     Signed, Shawn Breeding, MD  06/28/2022 2:09 PM    [place]

## 2022-06-27 ENCOUNTER — Ambulatory Visit (INDEPENDENT_AMBULATORY_CARE_PROVIDER_SITE_OTHER): Payer: Medicare HMO

## 2022-06-27 DIAGNOSIS — Z7901 Long term (current) use of anticoagulants: Secondary | ICD-10-CM | POA: Diagnosis not present

## 2022-06-27 LAB — POCT INR: INR: 2.7 (ref 2.0–3.0)

## 2022-06-27 NOTE — Patient Instructions (Addendum)
Pre visit review using our clinic review tool, if applicable. No additional management support is needed unless otherwise documented below in the visit note.  Continue 1 tablet daily except 2 tablets on Mondays and Thursdays. Recheck in 6 weeks. 

## 2022-06-27 NOTE — Progress Notes (Signed)
I have reviewed and agree with note, evaluation, plan.   Sydney Hasten, MD  

## 2022-06-27 NOTE — Progress Notes (Signed)
Pt has been taking doxycycline for one week. He has now finished the abx. Continue 1 tablet daily except 2 tablets on Mondays and Thursdays. Recheck in 6 weeks.

## 2022-06-28 ENCOUNTER — Ambulatory Visit: Payer: Medicare HMO | Attending: Cardiology | Admitting: Cardiology

## 2022-06-28 ENCOUNTER — Encounter: Payer: Self-pay | Admitting: Cardiology

## 2022-06-28 VITALS — BP 112/54 | HR 90 | Ht 67.0 in | Wt 202.0 lb

## 2022-06-28 DIAGNOSIS — I2510 Atherosclerotic heart disease of native coronary artery without angina pectoris: Secondary | ICD-10-CM

## 2022-06-28 DIAGNOSIS — I10 Essential (primary) hypertension: Secondary | ICD-10-CM

## 2022-06-28 DIAGNOSIS — E785 Hyperlipidemia, unspecified: Secondary | ICD-10-CM | POA: Diagnosis not present

## 2022-06-28 DIAGNOSIS — I4821 Permanent atrial fibrillation: Secondary | ICD-10-CM | POA: Diagnosis not present

## 2022-06-28 NOTE — Patient Instructions (Signed)
Medication Instructions:  Your physician recommends that you continue on your current medications as directed. Please refer to the Current Medication list given to you today.   *If you need a refill on your cardiac medications before your next appointment, please call your pharmacy*   Lab Work: NONE ordered at this time of appointment   If you have labs (blood work) drawn today and your tests are completely normal, you will receive your results only by: [place] (if you have MyChart) OR A paper copy in the mail If you have any lab test that is abnormal or we need to change your treatment, we will call you to review the results.   Testing/Procedures: NONE ordered at this time of appointment     Follow-Up: At [facility], you and your health needs are our priority.  As part of our continuing mission to provide you with exceptional heart care, we have created designated Provider Care Teams.  These Care Teams include your primary Cardiologist (physician) and Advanced Practice Providers (APPs -  Physician Assistants and Nurse Practitioners) who all work together to provide you with the care you need, when you need it.  We recommend signing up for the patient portal called "MyChart".  Sign up information is provided on this After Visit Summary.  MyChart is used to connect with patients for Virtual Visits (Telemedicine).  Patients are able to view lab/test results, encounter notes, upcoming appointments, etc.  Non-urgent messages can be sent to your provider as well.   To learn more about what you can do with MyChart, go to [email or web address].    Your next appointment:   1 year(s)  Provider:   Minus Breeding, MD     Other Instructions

## 2022-07-11 ENCOUNTER — Ambulatory Visit: Payer: Medicare HMO

## 2022-07-24 ENCOUNTER — Ambulatory Visit (INDEPENDENT_AMBULATORY_CARE_PROVIDER_SITE_OTHER): Payer: Medicare HMO

## 2022-07-24 DIAGNOSIS — I442 Atrioventricular block, complete: Secondary | ICD-10-CM | POA: Diagnosis not present

## 2022-07-24 LAB — CUP PACEART REMOTE DEVICE CHECK
Battery Impedance: 3499 Ohm
Battery Remaining Longevity: 18 mo
Battery Voltage: 2.72 V
Brady Statistic RV Percent Paced: 96 %
Date Time Interrogation Session: 20240220073124
Implantable Lead Connection Status: 753985
Implantable Lead Connection Status: 753985
Implantable Lead Implant Date: 20081007
Implantable Lead Implant Date: 20081007
Implantable Lead Location: 753859
Implantable Lead Location: 753860
Implantable Lead Model: 5076
Implantable Lead Model: 5076
Implantable Pulse Generator Implant Date: 20160714
Lead Channel Impedance Value: 0 Ohm
Lead Channel Impedance Value: 483 Ohm
Lead Channel Pacing Threshold Amplitude: 0.75 V
Lead Channel Pacing Threshold Pulse Width: 0.4 ms
Lead Channel Setting Pacing Amplitude: 2 V
Lead Channel Setting Pacing Pulse Width: 0.4 ms
Lead Channel Setting Sensing Sensitivity: 1.4 mV
Zone Setting Status: 755011

## 2022-07-25 DIAGNOSIS — H4031X2 Glaucoma secondary to eye trauma, right eye, moderate stage: Secondary | ICD-10-CM | POA: Diagnosis not present

## 2022-07-25 DIAGNOSIS — Z961 Presence of intraocular lens: Secondary | ICD-10-CM | POA: Diagnosis not present

## 2022-08-06 DIAGNOSIS — Z08 Encounter for follow-up examination after completed treatment for malignant neoplasm: Secondary | ICD-10-CM | POA: Diagnosis not present

## 2022-08-06 DIAGNOSIS — L814 Other melanin hyperpigmentation: Secondary | ICD-10-CM | POA: Diagnosis not present

## 2022-08-06 DIAGNOSIS — D225 Melanocytic nevi of trunk: Secondary | ICD-10-CM | POA: Diagnosis not present

## 2022-08-06 DIAGNOSIS — I872 Venous insufficiency (chronic) (peripheral): Secondary | ICD-10-CM | POA: Diagnosis not present

## 2022-08-06 DIAGNOSIS — L570 Actinic keratosis: Secondary | ICD-10-CM | POA: Diagnosis not present

## 2022-08-06 DIAGNOSIS — Z85828 Personal history of other malignant neoplasm of skin: Secondary | ICD-10-CM | POA: Diagnosis not present

## 2022-08-06 DIAGNOSIS — L821 Other seborrheic keratosis: Secondary | ICD-10-CM | POA: Diagnosis not present

## 2022-08-06 DIAGNOSIS — C44222 Squamous cell carcinoma of skin of right ear and external auricular canal: Secondary | ICD-10-CM | POA: Diagnosis not present

## 2022-08-06 DIAGNOSIS — D485 Neoplasm of uncertain behavior of skin: Secondary | ICD-10-CM | POA: Diagnosis not present

## 2022-08-07 ENCOUNTER — Telehealth: Payer: Medicare HMO | Admitting: Cardiology

## 2022-08-08 ENCOUNTER — Ambulatory Visit (INDEPENDENT_AMBULATORY_CARE_PROVIDER_SITE_OTHER): Payer: Medicare HMO

## 2022-08-08 DIAGNOSIS — Z7901 Long term (current) use of anticoagulants: Secondary | ICD-10-CM

## 2022-08-08 LAB — POCT INR: INR: 2.4 (ref 2.0–3.0)

## 2022-08-08 NOTE — Progress Notes (Signed)
I have reviewed and agree with note, evaluation, plan.   Rajendra Spiller, MD  

## 2022-08-08 NOTE — Patient Instructions (Signed)
Pre visit review using our clinic review tool, if applicable. No additional management support is needed unless otherwise documented below in the visit note.  Continue 1 tablet daily except 2 tablets on Mondays and Thursdays. Recheck in 6 weeks.

## 2022-08-08 NOTE — Progress Notes (Signed)
Continue 1 tablet daily except 2 tablets on Mondays and Thursdays. Recheck in 6 weeks.

## 2022-08-23 NOTE — Progress Notes (Signed)
Remote pacemaker transmission.   

## 2022-09-10 ENCOUNTER — Encounter: Payer: Self-pay | Admitting: Cardiology

## 2022-09-10 ENCOUNTER — Ambulatory Visit (INDEPENDENT_AMBULATORY_CARE_PROVIDER_SITE_OTHER): Payer: Medicare HMO | Admitting: Family Medicine

## 2022-09-10 ENCOUNTER — Encounter: Payer: Self-pay | Admitting: Family Medicine

## 2022-09-10 ENCOUNTER — Ambulatory Visit (HOSPITAL_COMMUNITY)
Admission: RE | Admit: 2022-09-10 | Discharge: 2022-09-10 | Disposition: A | Discharge status: Home / Self Care | Payer: Medicare HMO | Source: Ambulatory Visit | Attending: Internal Medicine | Admitting: Internal Medicine

## 2022-09-10 ENCOUNTER — Ambulatory Visit (INDEPENDENT_AMBULATORY_CARE_PROVIDER_SITE_OTHER): Payer: Medicare HMO | Admitting: Cardiology

## 2022-09-10 VITALS — BP 134/56 | HR 65 | Ht 67.0 in | Wt 208.2 lb

## 2022-09-10 VITALS — BP 120/60 | HR 86 | Temp 97.6°F | Ht 67.0 in | Wt 207.0 lb

## 2022-09-10 DIAGNOSIS — M79662 Pain in left lower leg: Secondary | ICD-10-CM | POA: Diagnosis not present

## 2022-09-10 DIAGNOSIS — R0602 Shortness of breath: Secondary | ICD-10-CM | POA: Insufficient documentation

## 2022-09-10 DIAGNOSIS — I10 Essential (primary) hypertension: Secondary | ICD-10-CM | POA: Diagnosis not present

## 2022-09-10 DIAGNOSIS — R600 Localized edema: Secondary | ICD-10-CM

## 2022-09-10 DIAGNOSIS — I2510 Atherosclerotic heart disease of native coronary artery without angina pectoris: Secondary | ICD-10-CM | POA: Diagnosis not present

## 2022-09-10 DIAGNOSIS — I480 Paroxysmal atrial fibrillation: Secondary | ICD-10-CM | POA: Diagnosis not present

## 2022-09-10 DIAGNOSIS — Z7901 Long term (current) use of anticoagulants: Secondary | ICD-10-CM

## 2022-09-10 LAB — CBC WITH DIFFERENTIAL/PLATELET
Basophils Absolute: 0 10*3/uL (ref 0.0–0.1)
Basophils Relative: 0.6 % (ref 0.0–3.0)
Eosinophils Absolute: 0.2 10*3/uL (ref 0.0–0.7)
Eosinophils Relative: 2.6 % (ref 0.0–5.0)
HCT: 41.3 % (ref 39.0–52.0)
Hemoglobin: 14.2 g/dL (ref 13.0–17.0)
Lymphocytes Relative: 31.8 % (ref 12.0–46.0)
Lymphs Abs: 2.4 10*3/uL (ref 0.7–4.0)
MCHC: 34.4 g/dL (ref 30.0–36.0)
MCV: 88.7 fl (ref 78.0–100.0)
Monocytes Absolute: 0.7 10*3/uL (ref 0.1–1.0)
Monocytes Relative: 8.8 % (ref 3.0–12.0)
Neutro Abs: 4.2 10*3/uL (ref 1.4–7.7)
Neutrophils Relative %: 56.2 % (ref 43.0–77.0)
Platelets: 163 10*3/uL (ref 150.0–400.0)
RBC: 4.65 Mil/uL (ref 4.22–5.81)
RDW: 15.1 % (ref 11.5–15.5)
WBC: 7.5 10*3/uL (ref 4.0–10.5)

## 2022-09-10 LAB — COMPREHENSIVE METABOLIC PANEL
ALT: 20 U/L (ref 0–53)
AST: 16 U/L (ref 0–37)
Albumin: 4 g/dL (ref 3.5–5.2)
Alkaline Phosphatase: 83 U/L (ref 39–117)
BUN: 12 mg/dL (ref 6–23)
CO2: 26 mEq/L (ref 19–32)
Calcium: 8.9 mg/dL (ref 8.4–10.5)
Chloride: 106 mEq/L (ref 96–112)
Creatinine, Ser: 1.01 mg/dL (ref 0.40–1.50)
GFR: 67.75 mL/min (ref >60.00)
Glucose, Bld: 122 mg/dL — ABNORMAL HIGH (ref 70–99)
Potassium: 3.9 mEq/L (ref 3.5–5.1)
Sodium: 139 mEq/L (ref 135–145)
Total Bilirubin: 0.6 mg/dL (ref 0.2–1.2)
Total Protein: 6.9 g/dL (ref 6.0–8.3)

## 2022-09-10 LAB — PROTIME-INR
INR: 3.4 ratio — ABNORMAL HIGH (ref 0.8–1.0)
Prothrombin Time: 34.2 s — ABNORMAL HIGH (ref 9.6–13.1)

## 2022-09-10 LAB — URINALYSIS, ROUTINE W REFLEX MICROSCOPIC
Bilirubin Urine: NEGATIVE
Ketones, ur: NEGATIVE
Leukocytes,Ua: NEGATIVE
Nitrite: NEGATIVE
Specific Gravity, Urine: 1.025 (ref 1.000–1.030)
Total Protein, Urine: NEGATIVE
Urine Glucose: NEGATIVE
Urobilinogen, UA: 0.2 (ref 0.0–1.0)
pH: 6 (ref 5.0–8.0)

## 2022-09-10 LAB — TSH: TSH: 3.58 u[IU]/mL (ref 0.35–5.50)

## 2022-09-10 LAB — BRAIN NATRIURETIC PEPTIDE: Pro B Natriuretic peptide (BNP): 123 pg/mL — ABNORMAL HIGH (ref 0.0–100.0)

## 2022-09-10 MED ORDER — FUROSEMIDE 20 MG PO TABS: 20.0000 mg | ORAL_TABLET | Freq: Every day | ORAL | 0 refills | Status: DC

## 2022-09-10 NOTE — Patient Instructions (Addendum)
Medication Instructions:  Your physician has recommended you make the following change in your medication:   -Take furosemide (lasix) 20mg  once daily for 3 days.  *If you need a refill on your cardiac medications before your next appointment, please call your pharmacy*    Testing/Procedures: Your physician has requested that you have an echocardiogram. Echocardiography is a painless test that uses sound waves to create images of your heart. It provides your doctor with information about the size and shape of your heart and how well your heart's chambers and valves are working. This procedure takes approximately one hour. There are no restrictions for this procedure. Please do NOT wear cologne, perfume, aftershave, or lotions (deodorant is allowed). Please arrive 15 minutes prior to your appointment time.     Follow-Up: At [facility], you and your health needs are our priority.  As part of our continuing mission to provide you with exceptional heart care, we have created designated Provider Care Teams.  These Care Teams include your primary Cardiologist (physician) and Advanced Practice Providers (APPs -  Physician Assistants and Nurse Practitioners) who all work together to provide you with the care you need, when you need it.  We recommend signing up for the patient portal called "MyChart".  Sign up information is provided on this After Visit Summary.  MyChart is used to connect with patients for Virtual Visits (Telemedicine).  Patients are able to view lab/test results, encounter notes, upcoming appointments, etc.  Non-urgent messages can be sent to your provider as well.   To learn more about what you can do with MyChart, go to [email or web address].    Your next appointment:   1 week(s)  Provider:   Rollene Rotunda, MD     Other Instructions Please call your coumadin clinic- let them know your INR is at 3.4 so they can advise you.

## 2022-09-10 NOTE — Progress Notes (Signed)
Phone [phone] In person visit   Subjective:   Shawn [name] is a 88 y.o. year old very pleasant male patient who presents for/with See problem oriented charting Chief Complaint  Patient presents with   Follow-up    Pt is here to f/u on fluid In left leg.    Past Medical History-  Patient Active Problem List   Diagnosis Date Noted   Complete heart block 04/02/2019    Priority: High   CAD, NATIVE VESSEL 09/27/2008    Priority: High   PACEMAKER, PERMANENT 03/11/2007    Priority: High   Atrial fibrillation 02/11/2007    Priority: High   Benign paroxysmal positional vertigo of right ear 09/24/2019    Priority: Medium    Status post total right knee replacement 08/11/2019    Priority: Medium    History of adenomatous polyp of colon 09/29/2014    Priority: Medium    BPH associated with nocturia 09/09/2014    Priority: Medium    Hyperlipidemia 03/10/2008    Priority: Medium    Essential hypertension 12/02/2006    Priority: Medium    Aortic atherosclerosis 10/28/2018    Priority: Low   Long term (current) use of anticoagulants 02/18/2017    Priority: Low   Encounter for therapeutic drug monitoring 09/24/2013    Priority: Low   History of thrombocytopenia 06/17/2011    Priority: Low   Hx of skin cancer, basal cell 04/07/2008    Priority: Low   Actinic keratosis 04/07/2008    Priority: Low   Allergic rhinitis 12/02/2006    Priority: Low   GERD 12/02/2006    Priority: Low   Benign microscopic hematuria 10/02/2018    Medications- reviewed and updated Current Outpatient Medications  Medication Sig Dispense Refill   acetaminophen (TYLENOL) 325 MG tablet Take 325-650 mg by mouth every 6 (six) hours as needed (pain).     albuterol (VENTOLIN HFA) 108 (90 Base) MCG/ACT inhaler Inhale 2 puffs into the lungs every 6 (six) hours as needed for wheezing or shortness of breath (With acute illness. no asthma or COPD history). 1 each 0   atorvastatin (LIPITOR) 40 MG tablet  TAKE 1 TABLET EVERY DAY AT 6PM 90 tablet 1   diltiazem (CARDIZEM CD) 240 MG 24 hr capsule Take 1 capsule (240 mg total) by mouth daily. 90 capsule 3   finasteride (PROSCAR) 5 MG tablet TAKE 1 TABLET EVERY DAY 90 tablet 10   fluticasone (FLONASE) 50 MCG/ACT nasal spray Place 2 sprays into both nostrils daily. 48 g 3   metoprolol succinate (TOPROL-XL) 50 MG 24 hr tablet TAKE 1 AND 1/2 TABLETS EVERY DAY. TAKE WITH OR IMMEDIATELY FOLLOWING A MEAL. 135 tablet 3   Multiple Vitamins-Minerals (MULTIVITAMIN & MINERAL PO) Take 1 tablet by mouth every other day.      nitroGLYCERIN (NITROSTAT) 0.4 MG SL tablet Place 1 tablet (0.4 mg total) under the tongue every 5 (five) minutes as needed for chest pain. 25 tablet 1   tamsulosin (FLOMAX) 0.4 MG CAPS capsule TAKE 2 CAPSULES EVERY DAY 180 capsule 3   warfarin (COUMADIN) 2.5 MG tablet TAKE 2 TABLET BY MOUTH DAILY OR TAKE AS DIRECTED BY ANTICOAGULATION CLINIC 200 tablet 3   No current facility-administered medications for this visit.     Objective:  BP 120/60   Pulse 86   Temp 97.6 F (36.4 C)   Ht 5\' 7"  (1.702 m)   Wt 207 lb (93.9 kg)   SpO2 98%   BMI 32.42 kg/m  Gen: NAD, resting comfortably CV: RRR no murmurs rubs or gallops Lungs: CTAB no crackles, wheeze, rhonchi Ext: 1+ edema on the right lower extremity, 1-2+ edema on left lower extremity with left calf tenderness noted Skin: warm, dry     Assessment and Plan    #Bilateral  edema-  Left leg ?right S:started about 2 weeks ago. Coumadin levels have been within normal range as of last month-08/08/22. Both legs swollen but left is worse. Prior sunburn years ago on left leg >right leg- skin gets irritated and has tried several things. Mild shortness of breath but feels like not particularly bothesome- gradual worsening with age- no recent change.  Lab Results  Component Value Date   INR 2.4 08/08/2022   INR 2.7 06/27/2022   INR 2.6 05/30/2022   PROTIME 18.1 11/03/2008  A/P: 88 year old male  with bilateral leg edema left greater than right.  He is on Coumadin so I strongly doubt DVT but he has some left calf pain and we opted to rule this out with duplex-ordered a stat - Also check CBC, CMP, BNP, thyroid and urinalysis to look for other causes-if no obvious cause can focus on elevation and we could consider compression stockings as long as no pain in legs with this -Calf strain or injury also within differential  #Cyst on neck about 1 x 1 cm-suspected cyst on posterior portion of neck-sees dermatology on Thursday-we also discussed possibility this could be a lymph node but only more heavily toward cyst-recommended letting me know/proceeding with referral to ENT or general surgery to consider biopsy as reports has been there for a year to evaluate for lymphadenopathy  #CAD with history of stents #hyperlipidemia S: Medication: Atorvastatin 40Mg , Coumadin (not on aspirin as a result)  A/P: Denies chest pain or shortness of breath-also doubt pulmonary embolism as only complaint/symptom is bilateral leg edema left greater than right along with left calf pain-more concerned about DVT  # Atrial fibrillation with pacemaker due to history of tachybradycardia and complete heart block syndrome-follows Dr. Ladona Ridgel S: Rate controlled with diltiazem 240 mg extended release, metoprolol 75 mg extended release Anticoagulated with Coumadin-follows with [facility] A/P: Appears to be in sinus but cannot confirm without EKG today. appropriately anticoagulated and rate controlled- continue current medicine -Once again think DVT is less likely but will check INR and also get venous duplex  #hypertension S: medication: Metoprolol 75 mg extended release, diltiazem 240 mg extended release A/P: Controlled. Continue current medications.  #BPH S: Medication: Finasteride 5 mg daily, tamsulosin 0.8 mg daily  A/P: Patient with ongoing symptoms-does not feel like tamsulosin has been as helpful-offered referral  to urology but he declines for now  Recommended follow up: Return in about 4 months (around 01/10/2023) for followup or sooner if needed.Schedule b4 you leave. Future Appointments  Date Time Provider Department Center  09/19/2022 10:00 AM [facility]ADIN [facility]  10/23/2022  7:00 AM [facility] DEVICE REMOTES [facility]  11/30/2022  9:00 AM [facility] ANNUAL WELLNESS VISIT 1 [facility]  01/22/2023  7:00 AM [facility] DEVICE REMOTES [facility]  04/23/2023  7:00 AM [facility] DEVICE REMOTES [facility]  07/23/2023  7:00 AM [facility] DEVICE REMOTES [facility]  10/22/2023  7:00 AM [facility] DEVICE REMOTES [facility]  01/21/2024  7:00 AM [facility] DEVICE REMOTES [facility]  04/21/2024  7:00 AM [facility] DEVICE REMOTES [facility]    Lab/Order associations:   ICD-10-CM   1. Lower leg edema  R60.0 CBC with Differential/Platelet  Comprehensive metabolic panel    TSH    Urinalysis, Routine w reflex microscopic    B Nat Peptide    Protime-INR    VAS US LOWER EXTREMITY VENOUS (DVT)    2. Pain of left calf  M79.662 VAS US LOWER EXTREMITY VENOUS (DVT)    3. Essential hypertension  I10     4. Paroxysmal atrial fibrillation  I48.0     5. Atherosclerosis of native coronary artery of native heart without angina pectoris  I25.10       No orders of the defined types were placed in this encounter.   Return precautions advised.  Tana ConchStephen Jalil Lorusso, MD

## 2022-09-10 NOTE — Progress Notes (Signed)
  Cardiology Office Note:   Date:  09/10/2022  ID:  Shawn [name], DOB 1934/09/13, MRN [id number]  History of Present Illness:   Shawn [name] is a 88 y.o. male  who presents for followup of lower extremity swelling.    He said he has been noticing some increased swelling over few weeks.  He is gained about 6 pounds.  He does not really think he had much in the way of increased shortness of breath.  He does do a little work with his tractor.  He has been eating salt but not excessively more than before.  He is not having any new chest pressure, neck or arm discomfort.  Has had no new palpitations, presyncope or syncope.  He went to see his primary today and they sent him over for DVT check and there was no evidence of clot.  They did draw some blood and his BNP was very mildly elevated.  Liver enzymes and renal function are normal.  His INR is slightly elevated.  ROS: As stated in the HPI and negative for all other systems.   Studies Reviewed:    EKG: Atrial fibrillation with ventricular pacing 100% capture   Risk Assessment/Calculations:    CHA2DS2-VASc Score = 3   This indicates a 3.2% annual risk of stroke. The patient's score is based upon: CHF History: 0 HTN History: 1 Diabetes History: 0 Stroke History: 0 Vascular Disease History: 0 Age Score: 2 Gender Score: 0           Physical Exam:   VS:  BP (!) 134/56 (BP Location: Left Arm, Patient Position: Sitting, Cuff Size: Large)   Pulse 65   Ht 5\' 7"  (1.702 m)   Wt 208 lb 3.2 oz (94.4 kg)   SpO2 97%   BMI 32.61 kg/m    Wt Readings from Last 3 Encounters:  09/10/22 208 lb 3.2 oz (94.4 kg)  09/10/22 207 lb (93.9 kg)  06/28/22 202 lb (91.6 kg)     GEN: Well nourished, well developed in no acute distress NECK: Positive JVD; No carotid bruits CARDIAC: IrregularRR, no murmurs, rubs, gallops RESPIRATORY:  Clear to auscultation without rales, wheezing or rhonchi  ABDOMEN: Soft, non-tender, non-distended EXTREMITIES:   Moderate leg edema; No deformity   ASSESSMENT AND PLAN:   ATRIAL FIBRILLATION -  [name] has a CHA2DS2 - VASc score of 3.  He tolerates anticoagulation.  He is up-to-date with pacemaker follow-up.  No change in therapy.  CAD, NATIVE VESSEL -  He is not having chest discomfort.  At this point I am not planning ischemia evaluation.  He will continue with risk reduction.  PACEMAKER, PERMANENT -  As above he is up-to-date with follow-up.  No change in therapy.    HTN - His blood pressure is at target.  Continue meds as listed.   EDEMA - He needs obstruction.  I am going to give him Lasix 20 mg daily for the next 3 days.  I am concerned given the weight gain and the mildly elevated BNP.  He does have some jugular venous distention.  I think he is overall congested and I am going to check an echocardiogram to begin with.  Other labs are unremarkable including his TSH.       Signed, Rollene Rotunda, MD

## 2022-09-10 NOTE — Patient Instructions (Addendum)
Let us know if you get your TDAP at your pharmacy.  Please stop by lab before you go If you have mychart- we will send your results within 3 business days of [place] receiving them.  If you do not have mychart- we will call you about results within 5 business days of [place] receiving them.  *please also note that you will see labs on mychart as soon as they post. I will later go in and write notes on them- will say "notes from Dr. Durene Cal"   Stat DVT scan- team please give him details  Recommended follow up: Return in about 4 months (around 01/10/2023) for followup or sooner if needed.Schedule b4 you leave. - more swelling or new shortness of breath etc - certainly sooner if symptoms worsen- let us know ASAP

## 2022-09-11 ENCOUNTER — Ambulatory Visit (INDEPENDENT_AMBULATORY_CARE_PROVIDER_SITE_OTHER): Payer: Medicare HMO

## 2022-09-11 DIAGNOSIS — Z7901 Long term (current) use of anticoagulants: Secondary | ICD-10-CM | POA: Diagnosis not present

## 2022-09-11 LAB — POCT INR: INR: 3.4 — AB (ref 2.0–3.0)

## 2022-09-11 NOTE — Progress Notes (Signed)
Cardiology Clinic Note   Patient Name: Shawn [name] Date of Encounter: 09/14/2022  Primary Care Provider:  Shelva Majestic, MD Primary Cardiologist:  Rollene Rotunda, MD  Patient Profile    88 year old male with known history of atrial fibrillation, CHA2DS2-VASc score of 3 on anticoagulation with warfarin, pacemaker in situ, CAD (DES x2 to RCA 2004 b. repeat PTCA to RCA 2008 c. PCI to RCA 2012 non-DES) , and chronic lower extremity edema.  Was seen last by Dr. Antoine Poche on 09/10/2022 with complaints of lower extremity edema.  He was started on Lasix 20 mg daily and was found to have a mildly elevated BNP of 123.  Echocardiogram was ordered and planned for 09/12/2022.  Lower extremity ultrasound was also ordered.  Past Medical History    Past Medical History:  Diagnosis Date   Actinic keratosis    Adenomatous colon polyp    ALLERGIC RHINITIS    Arthritis    CAD, NATIVE VESSEL    a. DES x2 to RCA 2004 b. repeat PTCA to RCA 2008 c. PCI to RCA 2012 non-DES    Cellulitis and abscess of trunk    Diverticulosis    Dysrhythmia    GERD (gastroesophageal reflux disease)    HYPERLIPIDEMIA    HYPERTENSION    OTH MALIG NEOPLASM SKIN OTH\T\UNSPEC PARTS FACE    Permanent atrial fibrillation    PONV (postoperative nausea and vomiting)    Presence of permanent cardiac pacemaker    Skin cancer    Tachy-brady syndrome    a. s/p MDT dual chamber pacemaker implant complicated by pericardial effusion and micro-perforatation s/p lead revisions   Past Surgical History:  Procedure Laterality Date   BACK SURGERY  2000   "for bulging discs"   CATARACT EXTRACTION W/ INTRAOCULAR LENS  IMPLANT, BILATERAL  2012   CORONARY ANGIOPLASTY WITH STENT PLACEMENT  2008   CORONARY ANGIOPLASTY WITH STENT PLACEMENT  05/14/11   CYSTECTOMY     from right side of face.   EP IMPLANTABLE DEVICE N/A 12/16/2014   Procedure: PPM Generator Changeout;  Surgeon: Marinus Maw, MD;  Location: [facility] INVASIVE CV LAB;  Service:  Cardiovascular;  Laterality: N/A;   LEAD REVISION  2008   11 days post initial implant, RA and RV lead revisions for micro-perforation and pericardial effusion by Dr Ladona Ridgel   LEFT HEART CATHETERIZATION WITH CORONARY ANGIOGRAM N/A 05/14/2011   Procedure: LEFT HEART CATHETERIZATION WITH CORONARY ANGIOGRAM;  Surgeon: Herby Abraham, MD;  Location: [facility] CATH LAB;  Service: Cardiovascular;  Laterality: N/A;   PACEMAKER INSERTION  2008   MDT dual chamber pacemaker implanted by Dr Antoine Poche   TOTAL KNEE ARTHROPLASTY Right 08/11/2019   Procedure: TOTAL KNEE ARTHROPLASTY;  Surgeon: Durene Romans, MD;  Location: [facility] ORS;  Service: Orthopedics;  Laterality: Right;  70 mins    Allergies  No Known Allergies  History of Present Illness    [name] presents today for close follow-up after being seen by Dr. Antoine Poche on 09/10/2022 for complaints of lower extremity edema, known history of CAD, PAF, and pacemaker in situ.  He was found to be volume overloaded on last office visit and started on Lasix with echocardiogram planned.  Lower extremity ultrasound was negative for DVT.  The patient states he is doing much better and is really felt the fluid retention improved.  He is lost between 3 to 4 pounds and feels his legs are less tight.  He still has mild edema on the right compared to the  left.  He has chronic woody edema bilaterally.  He admits to eating salty foods and salting his foods.  Home Medications    Current Outpatient Medications  Medication Sig Dispense Refill   acetaminophen (TYLENOL) 325 MG tablet Take 325-650 mg by mouth every 6 (six) hours as needed (pain).     albuterol (VENTOLIN HFA) 108 (90 Base) MCG/ACT inhaler Inhale 2 puffs into the lungs every 6 (six) hours as needed for wheezing or shortness of breath (With acute illness. no asthma or COPD history). 1 each 0   atorvastatin (LIPITOR) 40 MG tablet TAKE 1 TABLET EVERY DAY AT 6PM 90 tablet 1   brimonidine (ALPHAGAN) 0.2 % ophthalmic  solution Place 1 drop into both eyes in the morning and at bedtime.     diltiazem (CARDIZEM CD) 240 MG 24 hr capsule Take 1 capsule (240 mg total) by mouth daily. 90 capsule 3   finasteride (PROSCAR) 5 MG tablet TAKE 1 TABLET EVERY DAY 90 tablet 10   fluticasone (FLONASE) 50 MCG/ACT nasal spray Place 2 sprays into both nostrils daily. 48 g 3   furosemide (LASIX) 20 MG tablet Take Lasix 20 MG on Tuesday and Friday. Take Lasix 20 MG as needed for weight gain of 3 pounds or more on Saturday, Sunday Monday, and Wednesday. 10 tablet 3   metoprolol succinate (TOPROL-XL) 50 MG 24 hr tablet TAKE 1 AND 1/2 TABLETS EVERY DAY. TAKE WITH OR IMMEDIATELY FOLLOWING A MEAL. 135 tablet 3   Multiple Vitamins-Minerals (MULTIVITAMIN & MINERAL PO) Take 1 tablet by mouth every other day.      nitroGLYCERIN (NITROSTAT) 0.4 MG SL tablet Place 1 tablet (0.4 mg total) under the tongue every 5 (five) minutes as needed for chest pain. 25 tablet 1   tamsulosin (FLOMAX) 0.4 MG CAPS capsule TAKE 2 CAPSULES EVERY DAY 180 capsule 3   warfarin (COUMADIN) 2.5 MG tablet TAKE 2 TABLET BY MOUTH DAILY OR TAKE AS DIRECTED BY ANTICOAGULATION CLINIC 200 tablet 3   No current facility-administered medications for this visit.     Family History    Family History  Problem Relation Age of Onset   Ovarian cancer Mother    Heart attack Father    Colon cancer Father 62       deceased 59   Diabetes Brother        x 2   Heart disease Brother        x 3   He indicated that his mother is deceased. He indicated that his father is deceased. He indicated that the status of his brother is unknown. He indicated that his maternal grandmother is deceased. He indicated that his maternal grandfather is deceased. He indicated that his paternal grandmother is deceased. He indicated that his paternal grandfather is deceased.  Social History    Social History   Socioeconomic History   Marital status: Married    Spouse name: Not on file   Number  of children: 2   Years of education: Not on file   Highest education level: Not on file  Occupational History   Occupation: retired    Comment: Railroad   Tobacco Use   Smoking status: Never    Passive exposure: Past   Smokeless tobacco: Never  Vaping Use   Vaping Use: Never used  Substance and Sexual Activity   Alcohol use: No    Comment: 05/14/11 "last occasional drink was in 1990's"   Drug use: No   Sexual activity: Yes  Other Topics  Concern   Not on file  Social History Narrative   Widowed March 2016. 2 sons. 4 grandkids- all through college. 1 greatgrandchild on way 11/2015.    Lives by himself.       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   Social Determinants of Health   Financial Resource Strain: Low Risk  (11/24/2021)   Overall Financial Resource Strain (CARDIA)    Difficulty of Paying Living Expenses: Not hard at all  Food Insecurity: No Food Insecurity (11/24/2021)   Hunger Vital Sign    Worried About Running Out of Food in the Last Year: Never true    Ran Out of Food in the Last Year: Never true  Transportation Needs: No Transportation Needs (11/24/2021)   PRAPARE - Administrator, Civil Service (Medical): No    Lack of Transportation (Non-Medical): No  Physical Activity: Inactive (11/24/2021)   Exercise Vital Sign    Days of Exercise per Week: 0 days    Minutes of Exercise per Session: 0 min  Stress: No Stress Concern Present (11/24/2021)   [employer] of Occupational Health - Occupational Stress Questionnaire    Feeling of Stress : Not at all  Social Connections: Moderately Integrated (11/24/2021)   Social Connection and Isolation Panel [NHANES]    Frequency of Communication with Friends and Family: More than three times a week    Frequency of Social Gatherings with Friends and Family: More than three times a week     Attends Religious Services: More than 4 times per year    Active Member of [employer] or Organizations: Yes    Attends Banker Meetings: Never    Marital Status: Widowed  Intimate Partner Violence: Not At Risk (11/24/2021)   Humiliation, Afraid, Rape, and Kick questionnaire    Fear of Current or Ex-Partner: No    Emotionally Abused: No    Physically Abused: No    Sexually Abused: No     Review of Systems    General:  No chills, fever, night sweats or weight changes.  Cardiovascular:  No chest pain, dyspnea on exertion, edema, orthopnea, palpitations, paroxysmal nocturnal dyspnea. Dermatological: No rash, lesions/masses Respiratory: No cough, dyspnea Urologic: No hematuria, dysuria Abdominal:   No nausea, vomiting, diarrhea, bright red blood per rectum, melena, or hematemesis Neurologic:  No visual changes, wkns, changes in mental status. All other systems reviewed and are otherwise negative except as noted above.   Physical Exam    VS:  BP 108/60   Pulse 73   Ht 5\' 7"  (1.702 m)   Wt 204 lb (92.5 kg)   SpO2 98%   BMI 31.95 kg/m  , BMI Body mass index is 31.95 kg/m.     GEN: Well nourished, well developed, in no acute distress. HEENT: normal. Neck: Supple, no JVD, carotid bruits, or masses. Cardiac: IRRR, no murmurs, rubs, or gallops. No clubbing, cyanosis, bilateral 2+ edema.  Some shiny skin on the right, bilateral woody edema.  Radials/DP/PT 2+ and equal bilaterally.  Respiratory:  Respirations regular and unlabored, clear to auscultation bilaterally. GI: Soft, nontender, nondistended, BS + x 4. MS: no deformity or atrophy. Skin: warm and dry, no rash. Neuro:  Strength and sensation are intact. Psych: Normal affect.  Accessory Clinical Findings    ECG personally reviewed by me today-not completed this office visit  Lab Results  Component Value Date   WBC 7.5 09/10/2022   HGB 14.2 09/10/2022   HCT 41.3 09/10/2022   MCV 88.7 09/10/2022   PLT 163.0  09/10/2022   Lab Results  Component Value Date   CREATININE 1.01 09/10/2022   BUN 12 09/10/2022   NA 139 09/10/2022   K 3.9 09/10/2022   CL 106 09/10/2022   CO2 26 09/10/2022   Lab Results  Component Value Date   ALT 20 09/10/2022   AST 16 09/10/2022   ALKPHOS 83 09/10/2022   BILITOT 0.6 09/10/2022   Lab Results  Component Value Date   CHOL 88 12/13/2021   HDL 33.80 (L) 12/13/2021   LDLCALC 27 12/13/2021   LDLDIRECT 35.0 06/20/2022   TRIG 137.0 12/13/2021   CHOLHDL 3 12/13/2021    No results found for: "HGBA1C"  Review of Prior Studies:  Echocardiogram 09/12/2022 IMPRESSIONS   1. Left ventricular ejection fraction, by estimation, is 55 to 60%. The  left ventricle has normal function. The left ventricle has no regional  wall motion abnormalities. There is mild concentric left ventricular  hypertrophy. Left ventricular diastolic  parameters are indeterminate. The average left ventricular global  longitudinal strain is -20.4 %. The global longitudinal strain is normal.   2. Right ventricular systolic function is normal. The right ventricular  size is normal. There is mildly elevated pulmonary artery systolic  pressure. The estimated right ventricular systolic pressure is 39.1 mmHg.   3. The mitral valve is grossly normal. Trivial mitral valve  regurgitation.   4. Tricuspid valve regurgitation is moderate.   5. The aortic valve is tricuspid. There is mild calcification of the  aortic valve. Aortic valve regurgitation is not visualized. Aortic valve  sclerosis is present, with no evidence of aortic valve stenosis.   6. The inferior vena cava is dilated in size with >50% respiratory  variability, suggesting right atrial pressure of 8 mmHg.   Assessment & Plan   1.  Chronic lower extremity edema: Multifactorial with venous insufficiency, obesity, and high salt content in foods.  He also is on diltiazem which can cause a lot of dependent edema.  He has diuresed a good bit  for him over the last 3 days about 4 pounds.  Blood pressure is low at 108/60.  I will have him start taking Lasix twice a week on Tuesdays and Fridays, and if he gains 3 to 4 pounds overnight he will take an additional Lasix 20 mg as needed.  He is advised on a low-salt diet and given a salty 6 pamphlet.  He will have follow-up BMET in 2 weeks.  Echocardiogram revealed normal systolic function with normal RV pressures as well.  2.  Atrial fibrillation: Remains on diltiazem 240 mg daily, metoprolol 75 mg daily and Coumadin therapy with follow-up Coumadin appointment in 5 days.  Heart rate is well-controlled currently.  3.  Hypertension: Blood pressure low normal today.  Decreasing use of Lasix to Tuesdays and Fridays weekly with extra dose as needed for edema.  May need to consider adjusting diltiazem dose if necessary on follow-up appointment.  4.  Hypercholesterolemia: The patient remains on atorvastatin 40 mg daily.  Follow-up lipids and LFTs should be completed by primary care if not done recently.  We will order if not available on next office visit.  Goal of LDL less then 70 in the setting of aortic atherosclerosis.Leeroy Bock.        Signed, Florina Glas M. Liborio NixonLawrence DNP, ANP, AACC  09/14/2022 4:22 PM      Office [phone] Fax [phone]  Notice: This dictation was prepared with Dragon dictation along with smaller phrase technology. Any transcriptional errors that result from this process are unintentional and may not be corrected upon review.

## 2022-09-11 NOTE — Patient Instructions (Addendum)
Pre visit review using our clinic review tool, if applicable. No additional management support is needed unless otherwise documented below in the visit note.  Hold dose today and then change weekly dose to take 1 tablet daily except 2 tablets on Mondays. Recheck in 1 weeks.

## 2022-09-11 NOTE — Progress Notes (Signed)
Pt had apt with cardiology yesterday and labs were drawn. INR was drawn at that apt. No INR done during this encounter. Note was sent to coumadin clinic with INR result from PCP. Hold dose today and then change weekly dose to take 1 tablet daily except 2 tablets on Mondays. Recheck in 1 weeks. Advised pt on phone of dosing changes. Pt verbalized understanding. He will keep his coumadin clinic apt for next week for recheck.

## 2022-09-12 ENCOUNTER — Telehealth: Payer: Self-pay | Admitting: Family Medicine

## 2022-09-12 ENCOUNTER — Ambulatory Visit (HOSPITAL_COMMUNITY): Payer: Medicare HMO | Attending: Internal Medicine

## 2022-09-12 DIAGNOSIS — R0602 Shortness of breath: Secondary | ICD-10-CM

## 2022-09-12 LAB — ECHOCARDIOGRAM COMPLETE
Area-P 1/2: 4.06 cm2
S' Lateral: 2.5 cm

## 2022-09-12 NOTE — Telephone Encounter (Signed)
Patient returned call. Requests to be called on home phone, if unable to answer, please call cell phone

## 2022-09-13 DIAGNOSIS — D0421 Carcinoma in situ of skin of right ear and external auricular canal: Secondary | ICD-10-CM | POA: Diagnosis not present

## 2022-09-13 NOTE — Telephone Encounter (Signed)
Patient returned call to [place]. Informed pt that Ardine Bjork will be able to call him once she is next available.

## 2022-09-13 NOTE — Telephone Encounter (Signed)
Called and lm for pt tcb. 

## 2022-09-14 ENCOUNTER — Ambulatory Visit: Payer: Medicare HMO | Attending: Adult Health | Admitting: Adult Health

## 2022-09-14 ENCOUNTER — Encounter: Payer: Self-pay | Admitting: Adult Health

## 2022-09-14 VITALS — BP 108/60 | HR 73 | Ht 67.0 in | Wt 204.0 lb

## 2022-09-14 DIAGNOSIS — I10 Essential (primary) hypertension: Secondary | ICD-10-CM

## 2022-09-14 DIAGNOSIS — I4819 Other persistent atrial fibrillation: Secondary | ICD-10-CM

## 2022-09-14 DIAGNOSIS — E7800 Pure hypercholesterolemia, unspecified: Secondary | ICD-10-CM | POA: Diagnosis not present

## 2022-09-14 MED ORDER — FUROSEMIDE 20 MG PO TABS: ORAL_TABLET | ORAL | 3 refills | Status: DC

## 2022-09-14 NOTE — Patient Instructions (Addendum)
Medication Instructions:  Take Lasix 20 MG on Tuesday and Friday. Take Lasix 20 MG as needed for weight gain of 3 pounds or more on Saturday, Sunday Monday, and Wednesday.  *If you need a refill on your cardiac medications before your next appointment, please call your pharmacy*   Lab Work: None Ordered   Testing/Procedures: None Ordered   Follow-Up: At [facility], you and your health needs are our priority.  As part of our continuing mission to provide you with exceptional heart care, we have created designated Provider Care Teams.  These Care Teams include your primary Cardiologist (physician) and Advanced Practice Providers (APPs -  Physician Assistants and Nurse Practitioners) who all work together to provide you with the care you need, when you need it.  We recommend signing up for the patient portal called "MyChart".  Sign up information is provided on this After Visit Summary.  MyChart is used to connect with patients for Virtual Visits (Telemedicine).  Patients are able to view lab/test results, encounter notes, upcoming appointments, etc.  Non-urgent messages can be sent to your provider as well.   To learn more about what you can do with MyChart, go to [email or web address].    Your next appointment:   3 month(s)  Provider:   Rollene Rotunda, MD     Other Instructions Keep Scheduled Appointment with Coumadin Clinic.  PLEASE WEAR COMPRESSION STOCKINGS DAILY AND TAKE OFF AT BEDTIME. Compression stockings are elastic socks that squeeze the legs. They help to increase blood flow to the legs and to decrease swelling in the legs from fluid retention, and reduce the chance of developing blood clots in the lower legs. Please put on in the AM when dressing and off at night when dressing for bed.  LET THEM KNOW THAT YOU NEED KNEE HIGH'S WITH COMPRESSION OF 15-20 mmhg.  [employer]  [address] ([phone]); [place], [place] [address]; [phone]   EMAIL   [email or web address].  PLEASE MAKE SURE TO ELEVATE YOUR FEET & LEGS WHILE SITTING, THIS WILL HELP WITH THE SWELLING ALSO.

## 2022-09-14 NOTE — Telephone Encounter (Signed)
Called and spoke with pt and results reviewed. 

## 2022-09-14 NOTE — Telephone Encounter (Signed)
Patient requests to be called at ph# [phone] if unable to answer requests call home phone

## 2022-09-19 ENCOUNTER — Ambulatory Visit (INDEPENDENT_AMBULATORY_CARE_PROVIDER_SITE_OTHER): Payer: Medicare HMO

## 2022-09-19 DIAGNOSIS — Z7901 Long term (current) use of anticoagulants: Secondary | ICD-10-CM

## 2022-09-19 LAB — POCT INR: INR: 2.2 (ref 2.0–3.0)

## 2022-09-19 NOTE — Progress Notes (Addendum)
Was prescribed lasix x 3 days for edema on 4/8. Pt reports edema has resolved. Continue 1 tablet daily except 2 tablets on Mondays. Recheck in 7 weeks per pt request.

## 2022-09-19 NOTE — Patient Instructions (Addendum)
Pre visit review using our clinic review tool, if applicable. No additional management support is needed unless otherwise documented below in the visit note.  Continue 1 tablet daily except 2 tablets on Mondays. Recheck in 7 weeks

## 2022-09-19 NOTE — Progress Notes (Signed)
I have reviewed and agree with note, evaluation, plan.   Shantee Hayne, MD  

## 2022-10-23 ENCOUNTER — Ambulatory Visit (INDEPENDENT_AMBULATORY_CARE_PROVIDER_SITE_OTHER): Payer: Medicare HMO

## 2022-10-23 DIAGNOSIS — I4819 Other persistent atrial fibrillation: Secondary | ICD-10-CM

## 2022-10-23 LAB — CUP PACEART REMOTE DEVICE CHECK
Battery Impedance: 3956 Ohm
Battery Remaining Longevity: 14 mo
Battery Voltage: 2.71 V
Brady Statistic RV Percent Paced: 96 %
Date Time Interrogation Session: 20240521073529
Implantable Lead Connection Status: 753985
Implantable Lead Connection Status: 753985
Implantable Lead Implant Date: 20081007
Implantable Lead Implant Date: 20081007
Implantable Lead Location: 753859
Implantable Lead Location: 753860
Implantable Lead Model: 5076
Implantable Lead Model: 5076
Implantable Pulse Generator Implant Date: 20160714
Lead Channel Impedance Value: 0 Ohm
Lead Channel Impedance Value: 497 Ohm
Lead Channel Pacing Threshold Amplitude: 0.625 V
Lead Channel Pacing Threshold Pulse Width: 0.4 ms
Lead Channel Setting Pacing Amplitude: 2 V
Lead Channel Setting Pacing Pulse Width: 0.4 ms
Lead Channel Setting Sensing Sensitivity: 1.4 mV
Zone Setting Status: 755011

## 2022-10-24 DIAGNOSIS — H4031X2 Glaucoma secondary to eye trauma, right eye, moderate stage: Secondary | ICD-10-CM | POA: Diagnosis not present

## 2022-10-24 DIAGNOSIS — H5702 Anisocoria: Secondary | ICD-10-CM | POA: Diagnosis not present

## 2022-11-07 ENCOUNTER — Other Ambulatory Visit: Payer: Self-pay | Admitting: Family Medicine

## 2022-11-07 ENCOUNTER — Ambulatory Visit (INDEPENDENT_AMBULATORY_CARE_PROVIDER_SITE_OTHER): Payer: Medicare HMO

## 2022-11-07 DIAGNOSIS — Z7901 Long term (current) use of anticoagulants: Secondary | ICD-10-CM

## 2022-11-07 DIAGNOSIS — E785 Hyperlipidemia, unspecified: Secondary | ICD-10-CM

## 2022-11-07 LAB — POCT INR: INR: 2.4 (ref 2.0–3.0)

## 2022-11-07 NOTE — Patient Instructions (Addendum)
Pre visit review using our clinic review tool, if applicable. No additional management support is needed unless otherwise documented below in the visit note.  Continue 1 tablet daily except 2 tablets on Mondays. Recheck in 6 weeks.

## 2022-11-07 NOTE — Progress Notes (Signed)
I have reviewed and agree with note, evaluation, plan.   Racquelle Hyser, MD  

## 2022-11-07 NOTE — Progress Notes (Signed)
Continue 1 tablet daily except 2 tablets on Mondays. Recheck in 6 weeks.

## 2022-11-12 ENCOUNTER — Ambulatory Visit (INDEPENDENT_AMBULATORY_CARE_PROVIDER_SITE_OTHER): Payer: Medicare HMO

## 2022-11-12 VITALS — Wt 204.0 lb

## 2022-11-12 DIAGNOSIS — Z0000 Encounter for general adult medical examination without abnormal findings: Secondary | ICD-10-CM

## 2022-11-12 NOTE — Progress Notes (Signed)
I connected with  Shawn [name] on 11/12/22 by a audio enabled telemedicine application and verified that I am speaking with the correct person using two identifiers.  Patient Location: Home  Provider Location: Office/Clinic  I discussed the limitations of evaluation and management by telemedicine. The patient expressed understanding and agreed to proceed.   Subjective:   Shawn [name] is a 88 y.o. male who presents for Medicare Annual/Subsequent preventive examination.  Review of Systems     Cardiac Risk Factors include: advanced age (>48men, >77 women);dyslipidemia;hypertension;male gender;obesity (BMI >30kg/m2)     Objective:    Today's Vitals   11/12/22 1304  Weight: 204 lb (92.5 kg)   Body mass index is 31.95 kg/m.     11/12/2022    1:10 PM 11/24/2021    9:00 AM 02/02/2021    9:46 AM 10/07/2020    3:25 PM 08/11/2019    8:22 AM 08/05/2019    2:08 PM 07/28/2019   12:25 PM  Advanced Directives  Does Patient Have a Medical Advance Directive? Yes Yes Yes Yes Yes Yes Yes  Type of Estate agent of [place];Living will Healthcare Power of [place];Living will  Healthcare Power of [employer] of [place];Living will Healthcare Power of [place];Living will Living will;Healthcare Power of Attorney  Does patient want to make changes to medical advance directive?     No - Patient declined No - Patient declined No - Patient declined  Copy of Healthcare Power of Attorney in Chart? No - copy requested No - copy requested  No - copy requested No - copy requested No - copy requested No - copy requested    Current Medications (verified) Outpatient Encounter Medications as of 11/12/2022  Medication Sig   acetaminophen (TYLENOL) 325 MG tablet Take 325-650 mg by mouth every 6 (six) hours as needed (pain).   albuterol (VENTOLIN HFA) 108 (90 Base) MCG/ACT inhaler Inhale 2 puffs into the lungs every 6 (six) hours as needed for wheezing or shortness of breath  (With acute illness. no asthma or COPD history).   atorvastatin (LIPITOR) 40 MG tablet TAKE 1 TABLET EVERY DAY AT 6PM   brimonidine (ALPHAGAN) 0.2 % ophthalmic solution Place 1 drop into both eyes in the morning and at bedtime.   diltiazem (CARDIZEM CD) 240 MG 24 hr capsule Take 1 capsule (240 mg total) by mouth daily.   finasteride (PROSCAR) 5 MG tablet TAKE 1 TABLET EVERY DAY   fluticasone (FLONASE) 50 MCG/ACT nasal spray Place 2 sprays into both nostrils daily.   furosemide (LASIX) 20 MG tablet Take Lasix 20 MG on Tuesday and Friday. Take Lasix 20 MG as needed for weight gain of 3 pounds or more on Saturday, Sunday Monday, and Wednesday.   metoprolol succinate (TOPROL-XL) 50 MG 24 hr tablet TAKE 1 AND 1/2 TABLETS EVERY DAY. TAKE WITH OR IMMEDIATELY FOLLOWING A MEAL.   Multiple Vitamins-Minerals (MULTIVITAMIN & MINERAL PO) Take 1 tablet by mouth every other day.    nitroGLYCERIN (NITROSTAT) 0.4 MG SL tablet Place 1 tablet (0.4 mg total) under the tongue every 5 (five) minutes as needed for chest pain. (Patient taking differently: Place 0.4 mg under the tongue every 5 (five) minutes as needed for chest pain. On hand)   tamsulosin (FLOMAX) 0.4 MG CAPS capsule TAKE 2 CAPSULES EVERY DAY   warfarin (COUMADIN) 2.5 MG tablet TAKE 2 TABLET BY MOUTH DAILY OR TAKE AS DIRECTED BY ANTICOAGULATION CLINIC   No facility-administered encounter medications on file as of 11/12/2022.  Allergies (verified) Patient has no known allergies.   History: Past Medical History:  Diagnosis Date   Actinic keratosis    Adenomatous colon polyp    ALLERGIC RHINITIS    Arthritis    CAD, NATIVE VESSEL    a. DES x2 to RCA 2004 b. repeat PTCA to RCA 2008 c. PCI to RCA 2012 non-DES    Cellulitis and abscess of trunk    Diverticulosis    Dysrhythmia    GERD (gastroesophageal reflux disease)    HYPERLIPIDEMIA    HYPERTENSION    OTH MALIG NEOPLASM SKIN OTH\T\UNSPEC PARTS FACE    Permanent atrial fibrillation (HCC)     PONV (postoperative nausea and vomiting)    Presence of permanent cardiac pacemaker    Skin cancer    Tachy-brady syndrome (HCC)    a. s/p MDT dual chamber pacemaker implant complicated by pericardial effusion and micro-perforatation s/p lead revisions   Past Surgical History:  Procedure Laterality Date   BACK SURGERY  2000   "for bulging discs"   CATARACT EXTRACTION W/ INTRAOCULAR LENS  IMPLANT, BILATERAL  2012   CORONARY ANGIOPLASTY WITH STENT PLACEMENT  2008   CORONARY ANGIOPLASTY WITH STENT PLACEMENT  05/14/11   CYSTECTOMY     from right side of face.   EP IMPLANTABLE DEVICE N/A 12/16/2014   Procedure: PPM Generator Changeout;  Surgeon: Marinus Maw, MD;  Location[facility] INVASIVE CV LAB;  Service: Cardiovascular;  Laterality: N/A;   LEAD REVISION  2008   11 days post initial implant, RA and RV lead revisions for micro-perforation and pericardial effusion by Dr Ladona Ridgel   LEFT HEART CATHETERIZATION WITH CORONARY ANGIOGRAM N/A 05/14/2011   Procedure: LEFT HEART CATHETERIZATION WITH CORONARY ANGIOGRAM;  Surgeon: Herby Abraham, MD;  Location: [facility] CATH LAB;  Service: Cardiovascular;  Laterality: N/A;   PACEMAKER INSERTION  2008   MDT dual chamber pacemaker implanted by Dr Antoine Poche   TOTAL KNEE ARTHROPLASTY Right 08/11/2019   Procedure: TOTAL KNEE ARTHROPLASTY;  Surgeon: Durene Romans, MD;  Location: [facility] ORS;  Service: Orthopedics;  Laterality: Right;  70 mins   Family History  Problem Relation Age of Onset   Ovarian cancer Mother    Heart attack Father    Colon cancer Father 75       deceased 20   Diabetes Brother        x 2   Heart disease Brother        x 3   Social History   Socioeconomic History   Marital status: Widowed    Spouse name: Not on file   Number of children: 2   Years of education: Not on file   Highest education level: Not on file  Occupational History   Occupation: retired    Comment: Railroad   Tobacco Use   Smoking status: Never    Passive exposure:  Past   Smokeless tobacco: Never  Vaping Use   Vaping Use: Never used  Substance and Sexual Activity   Alcohol use: No    Comment: 05/14/11 "last occasional drink was in 1990's"   Drug use: No   Sexual activity: Yes  Other Topics Concern   Not on file  Social History Narrative   Widowed March 2016. 2 sons. 4 grandkids- all through college. 1 greatgrandchild on way 11/2015.    Lives by himself.       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       Hobbies: play golf (slowing  down due to right knee), enjoys maintaining the home and [place]- [place]   Social Determinants of Health   Financial Resource Strain: Low Risk  (11/12/2022)   Overall Financial Resource Strain (CARDIA)    Difficulty of Paying Living Expenses: Not hard at all  Food Insecurity: No Food Insecurity (11/12/2022)   Hunger Vital Sign    Worried About Running Out of Food in the Last Year: Never true    Ran Out of Food in the Last Year: Never true  Transportation Needs: No Transportation Needs (11/12/2022)   PRAPARE - Administrator, Civil Service (Medical): No    Lack of Transportation (Non-Medical): No  Physical Activity: Inactive (11/12/2022)   Exercise Vital Sign    Days of Exercise per Week: 0 days    Minutes of Exercise per Session: 0 min  Stress: No Stress Concern Present (11/12/2022)   [employer] of Occupational Health - Occupational Stress Questionnaire    Feeling of Stress : Not at all  Social Connections: Moderately Integrated (11/12/2022)   Social Connection and Isolation Panel [NHANES]    Frequency of Communication with Friends and Family: More than three times a week    Frequency of Social Gatherings with Friends and Family: More than three times a week    Attends Religious Services: More than 4 times per year    Active Member of [employer] or Organizations: Yes    Attends Banker Meetings: 1 to 4 times per year    Marital Status: Widowed    Tobacco  Counseling Counseling given: Not Answered   Clinical Intake:  Pre-visit preparation completed: Yes  Pain : No/denies pain     BMI - recorded: 31.95 Nutritional Status: BMI > 30  Obese Nutritional Risks: None Diabetes: No  How often do you need to have someone help you when you read instructions, pamphlets, or other written materials from your doctor or pharmacy?: 1 - Never  Diabetic?no  Interpreter Needed?: No  Information entered by :: Lanier Ensign, LPN   Activities of Daily Living    11/12/2022    1:11 PM 11/24/2021    9:01 AM  In your present state of health, do you have any difficulty performing the following activities:  Hearing? 1 0  Comment HOH   Vision? 0 0  Difficulty concentrating or making decisions? 0 0  Walking or climbing stairs? 0 0  Dressing or bathing? 0 0  Doing errands, shopping? 0 0  Preparing Food and eating ? N N  Using the Toilet? N N  In the past six months, have you accidently leaked urine? N Y  Comment  at times  Do you have problems with loss of bowel control? N Y  Comment  at times  Managing your Medications? N N  Managing your Finances? N N  Housekeeping or managing your Housekeeping? N N    Patient Care Team: Shelva Majestic, MD as PCP - General (Family Medicine) Rollene Rotunda, MD as PCP - Cardiology (Cardiology) Marinus Maw, MD as PCP - Electrophysiology (Cardiology) Shelva Majestic, MD (Family Medicine) Rene Paci, MD as Consulting Physician (Urology) Glennis Brink, MD as Consulting Physician (Dermatology) Nelson Chimes, MD as Consulting Physician (Ophthalmology) Durene Romans, MD as Consulting Physician (Orthopedic Surgery) Dahlia Byes, [facility] (Inactive) as Pharmacist (Pharmacist) Rollene Rotunda, MD as Consulting Physician (Cardiology)  Indicate any recent Medical Services you may have received from other than [facility] providers in the past year (date may be approximate).  Assessment:   This is a  routine wellness examination for [place].  Hearing/Vision screen Hearing Screening - Comments:: Pt stated slight loss  Vision Screening - Comments:: Pt follows up with [facility] for annual eye exams   Dietary issues and exercise activities discussed: Current Exercise Habits: The patient does not participate in regular exercise at present   Goals Addressed             This Visit's Progress    Patient Stated       Keep doing well        Depression Screen    11/12/2022    1:08 PM 09/10/2022   10:34 AM 11/24/2021    8:59 AM 10/07/2020    3:23 PM 09/29/2020   10:32 AM 09/24/2019    2:33 PM 07/28/2019   12:27 PM  PHQ 2/9 Scores  PHQ - 2 Score 0 0 0 0 0 0 0  PHQ- 9 Score  0   0 1     Fall Risk    11/12/2022    1:10 PM 09/10/2022   10:34 AM 11/24/2021    9:01 AM 10/07/2020    3:25 PM 09/29/2020   10:32 AM  Fall Risk   Falls in the past year? 0 0 0 0 0  Number falls in past yr: 0 0 0 0 0  Injury with Fall? 0 0 0 0 0  Risk for fall due to : Impaired vision No Fall Risks Impaired vision Impaired vision   Follow up Falls prevention discussed Falls evaluation completed Falls prevention discussed Falls prevention discussed     FALL RISK PREVENTION PERTAINING TO THE HOME:  Any stairs in or around the home? Yes  If so, are there any without handrails? No  Home free of loose throw rugs in walkways, pet beds, electrical cords, etc? Yes  Adequate lighting in your home to reduce risk of falls? Yes   ASSISTIVE DEVICES UTILIZED TO PREVENT FALLS:  Life alert? Yes  Use of a cane, walker or w/c? No  Grab bars in the bathroom? Yes  Shower chair or bench in shower? Yes  Elevated toilet seat or a handicapped toilet? Yes   TIMED UP AND GO:  Was the test performed? No .   Cognitive Function:        11/12/2022    1:11 PM 11/24/2021    9:03 AM 10/07/2020    3:28 PM 07/28/2019   12:26 PM  6CIT Screen  What Year? 0 points 0 points 0 points 0 points  What month? 0 points 0 points 0 points 0  points  What time? 0 points 0 points  0 points  Count back from 20 0 points 0 points 0 points 0 points  Months in reverse 0 points 0 points 0 points 0 points  Repeat phrase 0 points 0 points 6 points 2 points  Total Score 0 points 0 points  2 points    Immunizations Immunization History  Administered Date(s) Administered   Fluad Quad(high Dose 65+) 04/01/2019, 03/28/2020, 03/31/2021   Influenza Split 03/24/2012   Influenza Whole 03/10/2008, 03/16/2009, 04/05/2010   Influenza, High Dose Seasonal PF 04/02/2016, 04/08/2017, 03/19/2018, 03/12/2022   Influenza,inj,Quad PF,6+ Mos 03/30/2013, 03/16/2014, 03/11/2015   PFIZER(Purple Top)SARS-COV-2 Vaccination 06/09/2019, 07/15/2019, 04/22/2020   Pneumococcal Conjugate-13 09/09/2014   Pneumococcal Polysaccharide-23 11/05/2011   Tdap 11/05/2011   Zoster Recombinat (Shingrix) 03/15/2022, 06/08/2022    TDAP status: Up to date  Flu Vaccine status: Up to date  Pneumococcal vaccine  status: Up to date  Covid-19 vaccine status: Completed vaccines  Qualifies for Shingles Vaccine? Yes   Zostavax completed Yes   Shingrix Completed?: Yes  Screening Tests Health Maintenance  Topic Date Due   COVID-19 Vaccine (4 - 2023-24 season) 02/02/2022   INFLUENZA VACCINE  01/03/2023   Medicare Annual Wellness (AWV)  11/12/2023   Pneumonia Vaccine 29+ Years old  Completed   Zoster Vaccines- Shingrix  Completed   HPV VACCINES  Aged Out   DTaP/Tdap/Td  Discontinued   Colonoscopy  Discontinued    Health Maintenance  Health Maintenance Due  Topic Date Due   COVID-19 Vaccine (4 - 2023-24 season) 02/02/2022    Colorectal cancer screening: No longer required.    Additional Screening:   Vision Screening: Recommended annual ophthalmology exams for early detection of glaucoma and other disorders of the eye. Is the patient up to date with their annual eye exam?  Yes  Who is the provider or what is the name of the office in which the patient attends  annual eye exams? [facility]  If pt is not established with a provider, would they like to be referred to a provider to establish care? No .   Dental Screening: Recommended annual dental exams for proper oral hygiene  Community Resource Referral / Chronic Care Management: CRR required this visit?  No   CCM required this visit?  No      Plan:     I have personally reviewed and noted the following in the patient's chart:   Medical and social history Use of alcohol, tobacco or illicit drugs  Current medications and supplements including opioid prescriptions. Patient is not currently taking opioid prescriptions. Functional ability and status Nutritional status Physical activity Advanced directives List of other physicians Hospitalizations, surgeries, and ER visits in previous 12 months Vitals Screenings to include cognitive, depression, and falls Referrals and appointments  In addition, I have reviewed and discussed with patient certain preventive protocols, quality metrics, and best practice recommendations. A written personalized care plan for preventive services as well as general preventive health recommendations were provided to patient.     [name], LPN   [id number]/61/0960   Nurse Notes: none

## 2022-11-12 NOTE — Patient Instructions (Signed)
[name] , Thank you for taking time to come for your Medicare Wellness Visit. I appreciate your ongoing commitment to your health goals. Please review the following plan we discussed and let me know if I can assist you in the future.   These are the goals we discussed:  Goals      Patient Stated     None at this time      Patient Stated     Stay healthy      Patient Stated     Keep doing well         This is a list of the screening recommended for you and due dates:  Health Maintenance  Topic Date Due   COVID-19 Vaccine (4 - 2023-24 season) 02/02/2022   Flu Shot  01/03/2023   Medicare Annual Wellness Visit  11/12/2023   Pneumonia Vaccine  Completed   Zoster (Shingles) Vaccine  Completed   HPV Vaccine  Aged Out   DTaP/Tdap/Td vaccine  Discontinued   Colon Cancer Screening  Discontinued    Advanced directives: Please bring a copy of your health care power of attorney and living will to the office at your convenience.  Conditions/risks identified: keep doing well as I am   Next appointment: Follow up in one year for your annual wellness visit.   Preventive Care 21 Years and Older, Male  Preventive care refers to lifestyle choices and visits with your health care provider that can promote health and wellness. What does preventive care include? A yearly physical exam. This is also called an annual well check. Dental exams once or twice a year. Routine eye exams. Ask your health care provider how often you should have your eyes checked. Personal lifestyle choices, including: Daily care of your teeth and gums. Regular physical activity. Eating a healthy diet. Avoiding tobacco and drug use. Limiting alcohol use. Practicing safe sex. Taking low doses of aspirin every day. Taking vitamin and mineral supplements as recommended by your health care provider. What happens during an annual well check? The services and screenings done by your health care provider during your  annual well check will depend on your age, overall health, lifestyle risk factors, and family history of disease. Counseling  Your health care provider may ask you questions about your: Alcohol use. Tobacco use. Drug use. Emotional well-being. Home and relationship well-being. Sexual activity. Eating habits. History of falls. Memory and ability to understand (cognition). Work and work Astronomer. Screening  You may have the following tests or measurements: Height, weight, and BMI. Blood pressure. Lipid and cholesterol levels. These may be checked every 5 years, or more frequently if you are over 14 years old. Skin check. Lung cancer screening. You may have this screening every year starting at age 72 if you have a 30-pack-year history of smoking and currently smoke or have quit within the past 15 years. Fecal occult blood test (FOBT) of the stool. You may have this test every year starting at age 18. Flexible sigmoidoscopy or colonoscopy. You may have a sigmoidoscopy every 5 years or a colonoscopy every 10 years starting at age 8. Prostate cancer screening. Recommendations will vary depending on your family history and other risks. Hepatitis C blood test. Hepatitis B blood test. Sexually transmitted disease (STD) testing. Diabetes screening. This is done by checking your blood sugar (glucose) after you have not eaten for a while (fasting). You may have this done every 1-3 years. Abdominal aortic aneurysm (AAA) screening. You may need this  if you are a current or former smoker. Osteoporosis. You may be screened starting at age 67 if you are at high risk. Talk with your health care provider about your test results, treatment options, and if necessary, the need for more tests. Vaccines  Your health care provider may recommend certain vaccines, such as: Influenza vaccine. This is recommended every year. Tetanus, diphtheria, and acellular pertussis (Tdap, Td) vaccine. You may need a Td  booster every 10 years. Zoster vaccine. You may need this after age 27. Pneumococcal 13-valent conjugate (PCV13) vaccine. One dose is recommended after age 61. Pneumococcal polysaccharide (PPSV23) vaccine. One dose is recommended after age 32. Talk to your health care provider about which screenings and vaccines you need and how often you need them. This information is not intended to replace advice given to you by your health care provider. Make sure you discuss any questions you have with your health care provider. Document Released: 06/17/2015 Document Revised: 02/08/2016 Document Reviewed: 03/22/2015 Elsevier Interactive Patient Education  2017 [employer].  Fall Prevention in the Home Falls can cause injuries. They can happen to people of all ages. There are many things you can do to make your home safe and to help prevent falls. What can I do on the outside of my home? Regularly fix the edges of walkways and driveways and fix any cracks. Remove anything that might make you trip as you walk through a door, such as a raised step or threshold. Trim any bushes or trees on the path to your home. Use bright outdoor lighting. Clear any walking paths of anything that might make someone trip, such as rocks or tools. Regularly check to see if handrails are loose or broken. Make sure that both sides of any steps have handrails. Any raised decks and porches should have guardrails on the edges. Have any leaves, snow, or ice cleared regularly. Use sand or salt on walking paths during winter. Clean up any spills in your garage right away. This includes oil or grease spills. What can I do in the bathroom? Use night lights. Install grab bars by the toilet and in the tub and shower. Do not use towel bars as grab bars. Use non-skid mats or decals in the tub or shower. If you need to sit down in the shower, use a plastic, non-slip stool. Keep the floor dry. Clean up any water that spills on the floor  as soon as it happens. Remove soap buildup in the tub or shower regularly. Attach bath mats securely with double-sided non-slip rug tape. Do not have throw rugs and other things on the floor that can make you trip. What can I do in the bedroom? Use night lights. Make sure that you have a light by your bed that is easy to reach. Do not use any sheets or blankets that are too big for your bed. They should not hang down onto the floor. Have a firm chair that has side arms. You can use this for support while you get dressed. Do not have throw rugs and other things on the floor that can make you trip. What can I do in the kitchen? Clean up any spills right away. Avoid walking on wet floors. Keep items that you use a lot in easy-to-reach places. If you need to reach something above you, use a strong step stool that has a grab bar. Keep electrical cords out of the way. Do not use floor polish or wax that makes floors slippery.  If you must use wax, use non-skid floor wax. Do not have throw rugs and other things on the floor that can make you trip. What can I do with my stairs? Do not leave any items on the stairs. Make sure that there are handrails on both sides of the stairs and use them. Fix handrails that are broken or loose. Make sure that handrails are as long as the stairways. Check any carpeting to make sure that it is firmly attached to the stairs. Fix any carpet that is loose or worn. Avoid having throw rugs at the top or bottom of the stairs. If you do have throw rugs, attach them to the floor with carpet tape. Make sure that you have a light switch at the top of the stairs and the bottom of the stairs. If you do not have them, ask someone to add them for you. What else can I do to help prevent falls? Wear shoes that: Do not have high heels. Have rubber bottoms. Are comfortable and fit you well. Are closed at the toe. Do not wear sandals. If you use a stepladder: Make sure that it is  fully opened. Do not climb a closed stepladder. Make sure that both sides of the stepladder are locked into place. Ask someone to hold it for you, if possible. Clearly mark and make sure that you can see: Any grab bars or handrails. First and last steps. Where the edge of each step is. Use tools that help you move around (mobility aids) if they are needed. These include: Canes. Walkers. Scooters. Crutches. Turn on the lights when you go into a dark area. Replace any light bulbs as soon as they burn out. Set up your furniture so you have a clear path. Avoid moving your furniture around. If any of your floors are uneven, fix them. If there are any pets around you, be aware of where they are. Review your medicines with your doctor. Some medicines can make you feel dizzy. This can increase your chance of falling. Ask your doctor what other things that you can do to help prevent falls. This information is not intended to replace advice given to you by your health care provider. Make sure you discuss any questions you have with your health care provider. Document Released: 03/17/2009 Document Revised: 10/27/2015 Document Reviewed: 06/25/2014 Elsevier Interactive Patient Education  2017 [employer].

## 2022-11-12 NOTE — Progress Notes (Unsigned)
  Electrophysiology Office Note:   Date:  11/14/2022  ID:  Shawn [name], DOB [birth year], MRN [id number]  Primary Cardiologist: Rollene Rotunda, MD Electrophysiologist: Lewayne Bunting, MD   History of Present Illness:   Shawn [name] is a 88 y.o. male with h/o chronic AF, Obesity, SND/tachy-brady s/p MDT PPM  seen today for routine electrophysiology followup.   Since last being seen in our clinic the patient reports doing well.  he denies chest pain, palpitations, dyspnea, PND, orthopnea, nausea, vomiting, dizziness, syncope, edema, weight gain, or early satiety.   Review of systems complete and found to be negative unless listed in HPI.   Device History: MDT dual chamber PPM implanted 03/11/2007, gen change 12/16/2014 Programmed VVIR  Studies Reviewed:    PPM Interrogation-  reviewed in detail today,  See PACEART report.  EKG is not ordered today. EKG from 09/10/2022 reviewed which showed V pacing at 65 bpm with underlying EF.   Echo 09/12/2022 LVEF 55-60%      Physical Exam:   VS:  BP 128/66   Pulse 73   Ht 5\' 7"  (1.702 m)   Wt 204 lb (92.5 kg)   SpO2 97%   BMI 31.95 kg/m     Wt Readings from Last 3 Encounters:  11/14/22 204 lb (92.5 kg)  11/12/22 204 lb (92.5 kg)  09/14/22 204 lb (92.5 kg)    GEN: Well nourished, well developed in no acute distress NECK: No JVD; No carotid bruits CARDIAC: Regular rate and rhythm, no murmurs, rubs, gallops RESPIRATORY:  Clear to auscultation without rales, wheezing or rhonchi  ABDOMEN: Soft, non-tender, non-distended EXTREMITIES:  No edema; No deformity   ASSESSMENT AND PLAN:    Tachy-Brady syndrome s/p [employer] PPM  Normal PPM function See Pace Art report No changes today  Permanent Atrial fibrillation Continue coumadin for CHA2DS2/VASc at of at least 4.   Obesity Body mass index is 31.95 kg/m.  Encouraged lifestyle modification  Disposition:   Follow up with Dr. Ladona Ridgel in 12 months  Signed, Graciella Freer,  PA-C

## 2022-11-14 ENCOUNTER — Ambulatory Visit: Payer: Medicare HMO | Attending: Student | Admitting: Student

## 2022-11-14 ENCOUNTER — Encounter: Payer: Self-pay | Admitting: Student

## 2022-11-14 VITALS — BP 128/66 | HR 73 | Ht 67.0 in | Wt 204.0 lb

## 2022-11-14 DIAGNOSIS — I442 Atrioventricular block, complete: Secondary | ICD-10-CM

## 2022-11-14 DIAGNOSIS — I10 Essential (primary) hypertension: Secondary | ICD-10-CM | POA: Diagnosis not present

## 2022-11-14 DIAGNOSIS — I4821 Permanent atrial fibrillation: Secondary | ICD-10-CM

## 2022-11-14 LAB — CUP PACEART INCLINIC DEVICE CHECK
Battery Impedance: 3821 Ohm
Battery Remaining Longevity: 15 mo
Battery Voltage: 2.7 V
Brady Statistic RV Percent Paced: 96 %
Date Time Interrogation Session: 20240612084525
Implantable Lead Connection Status: 753985
Implantable Lead Connection Status: 753985
Implantable Lead Implant Date: 20081007
Implantable Lead Implant Date: 20081007
Implantable Lead Location: 753859
Implantable Lead Location: 753860
Implantable Lead Model: 5076
Implantable Lead Model: 5076
Implantable Pulse Generator Implant Date: 20160714
Lead Channel Impedance Value: 0 Ohm
Lead Channel Impedance Value: 487 Ohm
Lead Channel Pacing Threshold Amplitude: 0.625 V
Lead Channel Pacing Threshold Amplitude: 0.75 V
Lead Channel Pacing Threshold Pulse Width: 0.4 ms
Lead Channel Pacing Threshold Pulse Width: 0.4 ms
Lead Channel Sensing Intrinsic Amplitude: 2.8 mV
Lead Channel Setting Pacing Amplitude: 2 V
Lead Channel Setting Pacing Pulse Width: 0.4 ms
Lead Channel Setting Sensing Sensitivity: 1.4 mV
Zone Setting Status: 755011

## 2022-11-14 NOTE — Patient Instructions (Signed)
Medication Instructions:  Your physician recommends that you continue on your current medications as directed. Please refer to the Current Medication list given to you today.  *If you need a refill on your cardiac medications before your next appointment, please call your pharmacy*  Lab Work: None ordered If you have labs (blood work) drawn today and your tests are completely normal, you will receive your results only by: MyChart Message (if you have MyChart) OR A paper copy in the mail If you have any lab test that is abnormal or we need to change your treatment, we will call you to review the results.  Follow-Up: At [facility], you and your health needs are our priority.  As part of our continuing mission to provide you with exceptional heart care, we have created designated Provider Care Teams.  These Care Teams include your primary Cardiologist (physician) and Advanced Practice Providers (APPs -  Physician Assistants and Nurse Practitioners) who all work together to provide you with the care you need, when you need it.  We recommend signing up for the patient portal called "MyChart".  Sign up information is provided on this After Visit Summary.  MyChart is used to connect with patients for Virtual Visits (Telemedicine).  Patients are able to view lab/test results, encounter notes, upcoming appointments, etc.  Non-urgent messages can be sent to your provider as well.   To learn more about what you can do with MyChart, go to [email or web address].    Your next appointment:   1 year(s)  Provider:   Sherryl Manges, MD

## 2022-11-16 NOTE — Progress Notes (Signed)
Remote pacemaker transmission.   

## 2022-12-02 DIAGNOSIS — M7989 Other specified soft tissue disorders: Secondary | ICD-10-CM | POA: Insufficient documentation

## 2022-12-02 NOTE — Progress Notes (Unsigned)
  Cardiology Office Note:   Date:  12/03/2022  ID:  Shawn [name], DOB [birth year], MRN [id number] PCP: Shelva Majestic, MD  [facility] Providers Cardiologist:  Rollene Rotunda, MD Electrophysiologist:  Lewayne Bunting, MD {  History of Present Illness:   Shawn [name] is a 88 y.o. male who presents for followup of lower extremity swelling.    At the last visit he had some increased lower extremity swelling and I gave him some diuretic.  He was having little jugular venous tension shortness of breath.  He returned to be seen once in follow-up and was doing better.  He says he is doing okay since I saw him.  His weight is down.  He says he is breathing okay.  The swelling in his legs is better.  He takes his diuretic about every other day.  He is riding his tractor. The patient denies any new symptoms such as chest discomfort, neck or arm discomfort. There has been no new shortness of breath, PND or orthopnea. There have been no reported palpitations, presyncope or syncope.    ROS: As stated in the HPI and negative for all other systems.  Studies Reviewed:    EKG:   NA  Risk Assessment/Calculations:    CHA2DS2-VASc Score = 3   This indicates a 3.2% annual risk of stroke. The patient's score is based upon: CHF History: 0 HTN History: 1 Diabetes History: 0 Stroke History: 0 Vascular Disease History: 0 Age Score: 2 Gender Score: 0    Physical Exam:   VS:  BP (!) 140/68 (BP Location: Left Arm, Patient Position: Sitting, Cuff Size: Normal)   Pulse 78   Ht 5\' 7"  (1.702 m)   Wt 199 lb (90.3 kg)   SpO2 97%   BMI 31.17 kg/m    Wt Readings from Last 3 Encounters:  12/03/22 199 lb (90.3 kg)  11/14/22 204 lb (92.5 kg)  11/12/22 204 lb (92.5 kg)     GEN: Well nourished, well developed in no acute distress NECK: No JVD; No carotid bruits CARDIAC: RRR, no murmurs, rubs, gallops RESPIRATORY:  Clear to auscultation without rales, wheezing or rhonchi  ABDOMEN: Soft,  non-tender, non-distended EXTREMITIES:  Mild bilateral edema; No deformity   ASSESSMENT AND PLAN:    CHRONIC ATRIAL FIBRILLATION -  [name] has a CHA2DS2 - VASc score of 3.   He tolerates anticoagulation.  No change in therapy.    CAD, NATIVE VESSEL -  The patient has no new sypmtoms.  No further cardiovascular testing is indicated.  We will continue with aggressive risk reduction and meds as listed.  PACEMAKER, PERMANENT -  As above he is up-to-date with follow-up.    HTN - Elevated but this is unusual.  No change in therapy.  EDEMA - He is going to continue with his Lasix about every other day.  I will check a basic metabolic profile today.        Follow up with APP in six months.   Signed, Rollene Rotunda, MD

## 2022-12-03 ENCOUNTER — Ambulatory Visit: Payer: Medicare HMO | Attending: Cardiology | Admitting: Cardiology

## 2022-12-03 ENCOUNTER — Encounter: Payer: Self-pay | Admitting: Cardiology

## 2022-12-03 VITALS — BP 140/68 | HR 78 | Ht 67.0 in | Wt 199.0 lb

## 2022-12-03 DIAGNOSIS — I10 Essential (primary) hypertension: Secondary | ICD-10-CM

## 2022-12-03 DIAGNOSIS — I2510 Atherosclerotic heart disease of native coronary artery without angina pectoris: Secondary | ICD-10-CM

## 2022-12-03 DIAGNOSIS — I482 Chronic atrial fibrillation, unspecified: Secondary | ICD-10-CM | POA: Diagnosis not present

## 2022-12-03 DIAGNOSIS — M7989 Other specified soft tissue disorders: Secondary | ICD-10-CM | POA: Diagnosis not present

## 2022-12-03 MED ORDER — FUROSEMIDE 20 MG PO TABS: ORAL_TABLET | ORAL | 3 refills | Status: DC

## 2022-12-03 NOTE — Patient Instructions (Signed)
    Follow-Up: At [facility], you and your health needs are our priority.  As part of our continuing mission to provide you with exceptional heart care, we have created designated Provider Care Teams.  These Care Teams include your primary Cardiologist (physician) and Advanced Practice Providers (APPs -  Physician Assistants and Nurse Practitioners) who all work together to provide you with the care you need, when you need it.  We recommend signing up for the patient portal called "MyChart".  Sign up information is provided on this After Visit Summary.  MyChart is used to connect with patients for Virtual Visits (Telemedicine).  Patients are able to view lab/test results, encounter notes, upcoming appointments, etc.  Non-urgent messages can be sent to your provider as well.   To learn more about what you can do with MyChart, go to [email or web address].    Your next appointment:   6 month(s)  Provider:   Any app    

## 2022-12-03 NOTE — Addendum Note (Signed)
Addended by: Freddi Starr on: 12/03/2022 10:26 AM   Modules accepted: Orders

## 2022-12-04 LAB — BASIC METABOLIC PANEL
BUN/Creatinine Ratio: 25 — ABNORMAL HIGH (ref 10–24)
BUN: 16 mg/dL (ref 8–27)
CO2: 24 mmol/L (ref 20–29)
Calcium: 10.5 mg/dL — ABNORMAL HIGH (ref 8.6–10.2)
Chloride: 101 mmol/L (ref 96–106)
Creatinine, Ser: 0.65 mg/dL — ABNORMAL LOW (ref 0.76–1.27)
Glucose: 79 mg/dL (ref 70–99)
Potassium: 5 mmol/L (ref 3.5–5.2)
Sodium: 139 mmol/L (ref 134–144)
eGFR: 92 mL/min/{1.73_m2} (ref >59)

## 2022-12-05 ENCOUNTER — Encounter: Payer: Self-pay | Admitting: *Deleted

## 2022-12-19 ENCOUNTER — Ambulatory Visit: Payer: Medicare HMO

## 2022-12-19 DIAGNOSIS — Z7901 Long term (current) use of anticoagulants: Secondary | ICD-10-CM

## 2022-12-19 LAB — POCT INR: INR: 2.2 (ref 2.0–3.0)

## 2022-12-19 NOTE — Progress Notes (Signed)
Continue 1 tablet daily except 2 tablets on Mondays. Recheck in 6 weeks.

## 2022-12-19 NOTE — Patient Instructions (Addendum)
Pre visit review using our clinic review tool, if applicable. No additional management support is needed unless otherwise documented below in the visit note.  Continue 1 tablet daily except 2 tablets on Mondays. Recheck in 6 weeks.

## 2023-01-09 ENCOUNTER — Other Ambulatory Visit: Payer: Self-pay | Admitting: Family Medicine

## 2023-01-09 DIAGNOSIS — I10 Essential (primary) hypertension: Secondary | ICD-10-CM

## 2023-01-10 ENCOUNTER — Ambulatory Visit: Payer: Medicare HMO | Admitting: Family Medicine

## 2023-01-14 ENCOUNTER — Ambulatory Visit (INDEPENDENT_AMBULATORY_CARE_PROVIDER_SITE_OTHER): Payer: Medicare HMO | Admitting: Internal Medicine

## 2023-01-14 ENCOUNTER — Encounter: Payer: Self-pay | Admitting: Internal Medicine

## 2023-01-14 ENCOUNTER — Telehealth: Payer: Self-pay | Admitting: Family Medicine

## 2023-01-14 VITALS — BP 116/72 | HR 66 | Temp 97.7°F | Ht 67.0 in | Wt 202.2 lb

## 2023-01-14 DIAGNOSIS — L089 Local infection of the skin and subcutaneous tissue, unspecified: Secondary | ICD-10-CM

## 2023-01-14 DIAGNOSIS — L0291 Cutaneous abscess, unspecified: Secondary | ICD-10-CM

## 2023-01-14 MED ORDER — CEPHALEXIN 500 MG PO CAPS
500.0000 mg | ORAL_CAPSULE | Freq: Three times a day (TID) | ORAL | 0 refills | Status: DC
Start: 2023-01-14 — End: 2023-01-23

## 2023-01-14 NOTE — Patient Instructions (Signed)
VISIT SUMMARY:  During your visit, we discussed the bump on your head that has been present for several years and has recently become infected. You reported that the bump had been scraped by tree branches, leading to the suspected infection. You have been trying to manage the bump at home, but it has increased in size and become more tender recently. Upon examination, it appeared that the bump might have some plant matter embedded in it. You are currently taking warfarin, which influenced our choice of treatment.  YOUR PLAN:  -SCALP LESION WITH INFECTION: The bump on your head is an infected lesion, which means it's a damaged area of skin that has become infected. We need to treat the infection with antibiotics before a skin specialist can surgically remove the bump. We're starting you on a course of Keflex, an antibiotic, which you'll take three times a day for 10 days. We're choosing Keflex because it's safe to use with warfarin, the medication you're currently taking. We'll also send a sample of the pus from the bump to a lab to identify the bacteria causing the infection. Once the infection is cleared, you should continue with your scheduled appointment with the skin specialist for removal of the bump.  INSTRUCTIONS:  Please start taking Keflex three times a day for 10 days. Continue to clean the bump at home as you have been doing. If the bump gets worse, please return to see [place] before your scheduled appointment with the skin specialist. We'll provide additional care instructions via MyChart. Remember to keep your appointment with the skin specialist once the infection is cleared.   It was a pleasure seeing you today! Your health and satisfaction are our top priorities.   Glenetta Hew, MD  Next Steps:  [x]  Flexible Follow-Up: We recommend a follow up with your primary care, a specialist, or me within 1-2 weeks for ensuring your problem resolves. This allows for progress monitoring and  treatment adjustments. [x]  Early Intervention: Schedule sooner appointment, call our on-call services, or go to emergency room if there is Increase in pain or discomfort New or worsening symptoms Sudden or severe changes in your health [x]  Lab & X-ray Appointments: complete or schedule to complete today, or call to schedule.  X-rays: [facility] (M-F, 8:30am-noon or 1pm-5pm).  Making the Most of Our Focused (20 minute) Follow Up Appointments:  [x]   Clearly state your top concerns at the beginning of the visit to focus our discussion [x]   If you anticipate you will need more time, please inform the front desk during scheduling - we can book multiple appointments in the same week. [x]   If you have transportation problems- use our convenient video appointments or ask about transportation support. [x]   We can get down to business faster if you use MyChart to update information before the visit and submit non-urgent questions before your visit. Thank you for taking the time to provide details through MyChart.  Let our nurse know and she can import this information into your encounter documents.  Arrival and Wait Times: [x]   Arriving on time ensures that everyone receives prompt attention. [x]   Early morning (8a) and afternoon (1p) appointments tend to have shortest wait times. [x]   Unfortunately, we cannot delay appointments for late arrivals or hold slots during phone calls.  Getting Answers and Following Up  [x]   Simple Questions & Concerns: For quick questions or basic follow-up after your visit, reach [place] at [phone] or MyChart messaging. [x]   Complex Concerns: If your concern is more complex, scheduling an appointment might be best. Discuss this with the staff to find the most suitable option. [x]   Lab & Imaging Results: We'll contact you directly if results are abnormal or you don't use MyChart. Most normal results will be on MyChart within 2-3 business days, with a  review message from Dr. Jon Billings. Haven't heard back in 2 weeks? Need results sooner? Contact us at [phone]. [x]   Referrals: Our referral coordinator will manage specialist referrals. The specialist's office should contact you within 2 weeks to schedule an appointment. Call us if you haven't heard from them after 2 weeks.  Staying Connected  [x]   MyChart: Activate your MyChart for the fastest way to access results and message [place]. See the last page of this paperwork for instructions on how to activate.  Bring to Your Next Appointment  [x]   Medications: Please bring all your medication bottles to your next appointment to ensure we have an accurate record of your prescriptions. [x]   Health Diaries: If you're monitoring any health conditions at home, keeping a diary of your readings can be very helpful for discussions at your next appointment.  Billing  [x]   X-ray & Lab Orders: These are billed by separate companies. Contact the invoicing company directly for questions or concerns. [x]   Visit Charges: Discuss any billing inquiries with our administrative services team.  Your Satisfaction Matters  [x]   Share Your Experience: We strive for your satisfaction! If you have any complaints, or preferably compliments, please let Dr. Jon Billings know directly or contact our Practice Administrators, Edwena Felty or [employer], by asking at the front desk.   Reviewing Your Records  [x]   Review this early draft of your clinical encounter notes below and the final encounter summary tomorrow on MyChart after its been completed.   Skin infection -     WOUND CULTURE -     Cephalexin; Take 1 capsule (500 mg total) by mouth 3 (three) times daily.  Dispense: 21 capsule; Refill: 0  Abscess -     WOUND CULTURE -     Cephalexin; Take 1 capsule (500 mg total) by mouth 3 (three) times daily.  Dispense: 21 capsule; Refill: 0

## 2023-01-14 NOTE — Telephone Encounter (Signed)
Final outcome: See HCP within 4 hours. Patient saw Dr. Glenetta Hew today.  Patient Name First: Shawn Last: [name] Gender: Male DOB: [birth year] Age: 88 Y 11 M 25 D Return Phone Number: [phone] (Primary), [phone] (Secondary) Address: City/ State/ Zip: [place]  [address] Client [facility] - Human resources officer [facility] Night Provider Tana Conch- MD Contact Type Call Who Is Calling Patient / Member / Family / Caregiver Call Type Triage / Clinical Relationship To Patient Self Return Phone Number [phone] (Primary) Chief Complaint Head Injury (non urgent symptom) Reason for Call Request to Schedule Office Appointment Initial Comment Caller states that he needs to see if he can schedule with Dr. Durene Cal if possible. He has a spot on his head that he would like evaluated. He was hit on the head with a limb and the sore will not get any better. No other concerning symptoms other than the wound. Translation No Nurse Assessment Nurse: Carylon Perches, RN, Hilda Lias Date/Time Lamount Cohen Time): 01/14/2023 7:59:57 AM Confirm and document reason for call. If symptomatic, describe symptoms. ---Caller states that there is a skin scrape on the top of his head. It is from a tree limb three weeks ago. There is a little blood coming from it but not healing well. He is putting peroxide and alcohol on it. It swells and bleeds, not healing. Afebrile. Does the patient have any new or worsening symptoms? ---Yes Will a triage be completed? ---Yes Related visit to physician within the last 2 weeks? ---No Does the PT have any chronic conditions? (i.e. diabetes, asthma, this includes High risk factors for pregnancy, etc.) ---No Is this a behavioral health or substance abuse call? ---No Guidelines Guideline Title Affirmed Question Affirmed Notes Nurse Date/Time Lamount Cohen Time) Wound Infection Suspected [1] Looks  infected (e.g., spreading redness) AND [2] face wound Carylon Perches RN, Hilda Lias 01/14/2023 8:02:31 AM Disp. Time Lamount Cohen Time) Disposition Final User 01/14/2023 8:06:26 AM See HCP within 4 Hours (or PCP triage) Yes Carylon Perches, RN, Hilda Lias Final Disposition 01/14/2023 8:06:26 AM See HCP within 4 Hours (or PCP triage) Yes Carylon Perches, RN, [place] Grater Disagree/Comply Comply Caller Understands Yes PreDisposition Did not know what to do Care Advice Given Per Guideline SEE HCP (OR PCP TRIAGE) WITHIN 4 HOURS: * IF OFFICE WILL BE OPEN: You need to be seen within the next 3 or 4 hours. Call your doctor (or NP/PA) now or as soon as the office opens. CARE ADVICE per Wound Infection Suspected (Adult) guideline.

## 2023-01-14 NOTE — Progress Notes (Signed)
[facility]: [phone]   Routine Medical Office Visit  Patient:  Shawn [name]      Age: 88 y.o.       Sex:  male  Date:   01/14/2023 Patient Care Team: Shelva Majestic, MD as PCP - General (Family Medicine) Rollene Rotunda, MD as PCP - Cardiology (Cardiology) Marinus Maw, MD as PCP - Electrophysiology (Cardiology) Shelva Majestic, MD (Family Medicine) Rene Paci, MD as Consulting Physician (Urology) Glennis Brink, MD as Consulting Physician (Dermatology) Nelson Chimes, MD as Consulting Physician (Ophthalmology) Durene Romans, MD as Consulting Physician (Orthopedic Surgery) Dahlia Byes, [facility] (Inactive) as Pharmacist (Pharmacist) Rollene Rotunda, MD as Consulting Physician (Cardiology) Today's Healthcare Provider: Lula Olszewski, MD   Assessment and Plan:    Assessment & Plan Skin infection Scalp Lesion with Infection The lesion has been present for several years and was recently traumatized, leading to infection with pus expressed during the visit. There is a possibility of a foreign body, specifically plant matter, within the lesion. We discussed the necessity of antibiotics to address the infection before a dermatologist can surgically remove the lesion, which is already planned for 2 weeks from now. We will start Keflex three times a day for 10 days in order to avoid to an interaction with Warfarin. We will send the pus for culture to identify the causative bacteria. He should continue with the scheduled dermatology appointment for lesion removal once the infection is cleared. Additional care instructions will be provided via MyChart. If the lesion worsens, he is to return prior to the dermatology appointment.  He is already planned to see Dr. Durene Cal in 1 week. Abscess Was drained without lance and cultured. Tiny area adjoining the main injury which had become cellulitic.  This was cleaned with alcohol before and after expressing  the purulence X covered with gauze pressure to stop bleeding V    Orders Placed This Encounter  Procedures   Wound culture    Order Specific Question:   Source    Answer:   crown abscess   Meds ordered this encounter  Medications   cephALEXin (KEFLEX) 500 MG capsule    Sig: Take 1 capsule (500 mg total) by mouth 3 (three) times daily.    Dispense:  21 capsule    Refill:  0      Recommended follow up: 1 week or sooner if not responding. Future Appointments  Date Time Provider Department Center  01/22/2023  7:00 AM [facility] DEVICE REMOTES [facility]  01/23/2023 10:20 AM Shelva Majestic, MD [facility]  01/30/2023  9:30 AM [facility] COUMADIN CLINIC [facility]  04/23/2023  7:00 AM [facility] DEVICE REMOTES [facility]  07/23/2023  7:00 AM [facility] DEVICE REMOTES [facility]  10/22/2023  7:00 AM [facility] DEVICE REMOTES [facility]  11/18/2023 11:45 AM [facility] ANNUAL WELLNESS VISIT 1 [facility]  01/21/2024  7:00 AM [facility] DEVICE REMOTES [facility]  04/21/2024  7:00 AM [facility] DEVICE REMOTES [facility]           Clinical Presentation:    88 y.o. male who has Hx of skin cancer, basal cell; Hyperlipidemia; Essential hypertension; CAD, NATIVE VESSEL; Atrial fibrillation (HCC); Allergic rhinitis; GERD; Actinic keratosis; PACEMAKER, PERMANENT; History of thrombocytopenia; Encounter for therapeutic drug monitoring; BPH associated with nocturia; History of adenomatous polyp of colon; Long term (current) use of anticoagulants; Aortic atherosclerosis (HCC); Benign microscopic hematuria; Complete heart block (HCC); Status post total right knee replacement; Benign paroxysmal positional vertigo of  right ear; and Leg swelling on their problem list. His reasons/main concerns/chief complaints for today's office visit are Bump on top of head (About three weeks ago, tree branches scraped the top of his head  and now there is a possibly infected large sized bump there.)   AI-Extracted: Discussed the use of AI scribe software for clinical note transcription with the patient, who gave verbal consent to proceed.  History of Present Illness   The patient, a known patient of Dr. Durene Cal, presented with a chief complaint of a bump on the top of his head. The bump was pre-existing but had recently been scraped by tree branches, leading to suspected infection. The patient reported no pus discharge, only blood, and the lesion had been present for approximately three weeks.  The patient expressed a desire for the bump to heal and was open to the suggestion of antibiotics for treatment. He also expressed concern about the bump's persistence, questioning whether it would eventually resolve. The patient had previously seen a skin specialist and had another appointment scheduled for the end of the month.  The patient had attempted to manage the lesion at home, cleaning it with alcohol and applying pressure to try and drain it. However, this only resulted in blood discharge. He also reported that the bump had increased in size over the past few days.  Upon examination, the lesion appeared to be infected with possible plant matter embedded in the scab. The patient reported that the lesion had been about the size of a nickel before it was scratched and had raised significantly in the last few days. He also reported increased tenderness in the area over the past day.  The patient was on warfarin, which influenced the choice of antibiotic for treatment.       He  has a past medical history of Actinic keratosis, Adenomatous colon polyp, ALLERGIC RHINITIS, Arthritis, CAD, NATIVE VESSEL, Cellulitis and abscess of trunk, Diverticulosis, Dysrhythmia, GERD (gastroesophageal reflux disease), HYPERLIPIDEMIA, HYPERTENSION, OTH MALIG NEOPLASM SKIN OTH\T\UNSPEC PARTS FACE, Permanent atrial fibrillation (HCC), PONV (postoperative  nausea and vomiting), Presence of permanent cardiac pacemaker, Skin cancer, and Tachy-brady syndrome (HCC).  Problem overviews that were updated today: No problems updated.  Current Outpatient Medications on File Prior to Visit  Medication Sig   acetaminophen (TYLENOL) 325 MG tablet Take 325-650 mg by mouth every 6 (six) hours as needed (pain).   albuterol (VENTOLIN HFA) 108 (90 Base) MCG/ACT inhaler Inhale 2 puffs into the lungs every 6 (six) hours as needed for wheezing or shortness of breath (With acute illness. no asthma or COPD history).   atorvastatin (LIPITOR) 40 MG tablet TAKE 1 TABLET EVERY DAY AT 6PM   brimonidine (ALPHAGAN) 0.2 % ophthalmic solution Place 1 drop into both eyes in the morning and at bedtime.   diltiazem (CARDIZEM CD) 240 MG 24 hr capsule TAKE 1 CAPSULE EVERY DAY   finasteride (PROSCAR) 5 MG tablet TAKE 1 TABLET EVERY DAY   fluticasone (FLONASE) 50 MCG/ACT nasal spray Place 2 sprays into both nostrils daily.   furosemide (LASIX) 20 MG tablet Take Lasix 20 MG on Tuesday and Friday. Take Lasix 20 MG as needed for weight gain of 3 pounds or more on Saturday, Sunday Monday, and Wednesday.   metoprolol succinate (TOPROL-XL) 50 MG 24 hr tablet TAKE 1 AND 1/2 TABLETS EVERY DAY. TAKE WITH OR IMMEDIATELY FOLLOWING A MEAL.   Multiple Vitamins-Minerals (MULTIVITAMIN & MINERAL PO) Take 1 tablet by mouth every other day.    nitroGLYCERIN (  NITROSTAT) 0.4 MG SL tablet Place 1 tablet (0.4 mg total) under the tongue every 5 (five) minutes as needed for chest pain. (Patient taking differently: Place 0.4 mg under the tongue every 5 (five) minutes as needed for chest pain. On hand)   tamsulosin (FLOMAX) 0.4 MG CAPS capsule TAKE 2 CAPSULES EVERY DAY   warfarin (COUMADIN) 2.5 MG tablet TAKE 2 TABLET BY MOUTH DAILY OR TAKE AS DIRECTED BY ANTICOAGULATION CLINIC   No current facility-administered medications on file prior to visit.   There are no discontinued medications.       Clinical  Data Analysis:   Physical Exam  BP 116/72 (BP Location: Right Arm, Patient Position: Sitting)   Pulse 66   Temp 97.7 F (36.5 C) (Temporal)   Ht 5\' 7"  (1.702 m)   Wt 202 lb 3.2 oz (91.7 kg)   SpO2 97%   BMI 31.67 kg/m  Wt Readings from Last 10 Encounters:  01/14/23 202 lb 3.2 oz (91.7 kg)  12/03/22 199 lb (90.3 kg)  11/14/22 204 lb (92.5 kg)  11/12/22 204 lb (92.5 kg)  09/14/22 204 lb (92.5 kg)  09/10/22 208 lb 3.2 oz (94.4 kg)  09/10/22 207 lb (93.9 kg)  06/28/22 202 lb (91.6 kg)  06/20/22 201 lb 6.4 oz (91.4 kg)  05/14/22 208 lb 3.2 oz (94.4 kg)   Vital signs reviewed.  Nursing notes reviewed. Weight trend reviewed. Abnormalities and Problem-Specific physical exam findings:      Photographs Taken 01/14/2023 :  The white area visible hear was expressed without lancing to take wound culture, the remainder of the erythematous lesion was mostly inflamed tissue growth of the skin, some type of pre-exisiting raised skin neoplasm that was injured and subsequently became infected and inflamed.      General Appearance:  No acute distress appreciable.   Well-groomed, healthy-appearing male.  Well proportioned with no abnormal fat distribution.  Good muscle tone. Skin: Clear and well-hydrated. Pulmonary:  Normal work of breathing at rest, no respiratory distress apparent. SpO2: 97 %  Musculoskeletal: All extremities are intact.  Neurological:  Awake, alert, oriented, and engaged.  No obvious focal neurological deficits or cognitive impairments.  Sensorium seems unclouded.   Speech is clear and coherent with logical content. Psychiatric:  Appropriate mood, pleasant and cooperative demeanor, thoughtful and engaged during the exam  Results Reviewed:      No results found for any visits on 01/14/23.  Anti-coag visit on 12/19/2022  Component Date Value   INR 12/19/2022 2.2   Office Visit on 12/03/2022  Component Date Value   Glucose 12/03/2022 79    BUN 12/03/2022 16     Creatinine, Ser 12/03/2022 0.65 (L)    eGFR 12/03/2022 92    BUN/Creatinine Ratio 12/03/2022 25 (H)    Sodium 12/03/2022 139    Potassium 12/03/2022 5.0    Chloride 12/03/2022 101    CO2 12/03/2022 24    Calcium 12/03/2022 10.5 (H)   Office Visit on 11/14/2022  Component Date Value   Date Time Interrogation * 11/14/2022 [id number]    Pulse Generator Manufact* 11/14/2022 MERM    Pulse Gen Model 11/14/2022 [id number] Sensia    Pulse Gen Serial Number 11/14/2022 [id number]    Clinic Name 11/14/2022 [facility]    Implantable Pulse Genera* 11/14/2022 Implantable Pulse Generator    Implantable Pulse Genera* 11/14/2022 [id number]    Implantable Lead Manufac* 11/14/2022 MERM    Implantable Lead Model 11/14/2022 5076 CapSureFix Novus    Implantable Lead Serial *  11/14/2022 [id number]    Implantable Lead Implant* 11/14/2022 [id number]    Implantable Lead Location 11/14/2022 [id number]    Implantable Lead Connect* 11/14/2022 093235    Implantable Lead Manufac* 11/14/2022 MERM    Implantable Lead Model 11/14/2022 5076 CapSureFix Novus    Implantable Lead Serial * 11/14/2022 [id number]    Implantable Lead Implant* 11/14/2022 [id number]    Implantable Lead Location 11/14/2022 753859    Implantable Lead Connect* 11/14/2022 [id number]    Lead Channel Setting Sen* 11/14/2022 1.40    Lead Channel Setting Pac* 11/14/2022 0.40    Lead Channel Setting Pac* 11/14/2022 2.000    Zone Setting Status 11/14/2022 755011    Zone Setting Status 11/14/2022 Inactive    Lead Channel Impedance V* 11/14/2022 0    Lead Channel Impedance V* 11/14/2022 487    Lead Channel Sensing Int* 11/14/2022 2.80    Lead Channel Pacing Thre* 11/14/2022 0.750    Lead Channel Pacing Thre* 11/14/2022 0.40    Lead Channel Pacing Thre* 11/14/2022 0.625    Lead Channel Pacing Thre* 11/14/2022 0.40    Battery Status 11/14/2022 OK    Battery Remaining Longev* 11/14/2022 15    Battery Voltage 11/14/2022 2.70    Battery Impedance  11/14/2022 3,821    Brady Statistic RV Perce* 11/14/2022 96   Anti-coag visit on 11/07/2022  Component Date Value   INR 11/07/2022 2.4   Clinical Support on 10/23/2022  Component Date Value   Date Time Interrogation * 10/23/2022 [id number]    Pulse Generator Manufact* 10/23/2022 MERM    Pulse Gen Model 10/23/2022 [id number] Sensia    Pulse Gen Serial Number 10/23/2022 [id number]    Clinic Name 10/23/2022 [facility]    Implantable Pulse Genera* 10/23/2022 Implantable Pulse Generator    Implantable Pulse Genera* 10/23/2022 [id number]    Implantable Lead Manufac* 10/23/2022 MERM    Implantable Lead Model 10/23/2022 5076 CapSureFix Novus    Implantable Lead Serial * 10/23/2022 [id number]    Implantable Lead Implant* 10/23/2022 [id number]    Implantable Lead Location 10/23/2022 102585    Implantable Lead Connect* 10/23/2022 277824    Implantable Lead Manufac* 10/23/2022 MERM    Implantable Lead Model 10/23/2022 5076 CapSureFix Novus    Implantable Lead Serial * 10/23/2022 [id number]    Implantable Lead Implant* 10/23/2022 [id number]    Implantable Lead Location 10/23/2022 195093    Implantable Lead Connect* 10/23/2022 [id number]    Lead Channel Setting Sen* 10/23/2022 1.40    Lead Channel Setting Pac* 10/23/2022 0.40    Lead Channel Setting Pac* 10/23/2022 2.000    Zone Setting Status 10/23/2022 755011    Zone Setting Status 10/23/2022 Inactive    Lead Channel Impedance V* 10/23/2022 0    Lead Channel Impedance V* 10/23/2022 497    Lead Channel Pacing Thre* 10/23/2022 0.625    Lead Channel Pacing Thre* 10/23/2022 0.40    Battery Status 10/23/2022 OK    Battery Remaining Longev* 10/23/2022 14    Battery Voltage 10/23/2022 2.71    Battery Impedance 10/23/2022 3,956    Brady Statistic RV Perce* 10/23/2022 96   Anti-coag visit on 09/19/2022  Component Date Value   INR 09/19/2022 2.2   Appointment on 09/12/2022  Component Date Value   Area-P 1/2 09/12/2022 4.06    S' Lateral 09/12/2022  2.50    Est EF 09/12/2022 55 - 60%   Anti-coag visit on 09/11/2022  Component Date Value   INR 09/11/2022 3.4 (A)   Office Visit on 09/10/2022  Component Date Value  WBC 09/10/2022 7.5    RBC 09/10/2022 4.65    Hemoglobin 09/10/2022 14.2    HCT 09/10/2022 41.3    MCV 09/10/2022 88.7    MCHC 09/10/2022 34.4    RDW 09/10/2022 15.1    Platelets 09/10/2022 163.0    Neutrophils Relative % 09/10/2022 56.2    Lymphocytes Relative 09/10/2022 31.8    Monocytes Relative 09/10/2022 8.8    Eosinophils Relative 09/10/2022 2.6    Basophils Relative 09/10/2022 0.6    Neutro Abs 09/10/2022 4.2    Lymphs Abs 09/10/2022 2.4    Monocytes Absolute 09/10/2022 0.7    Eosinophils Absolute 09/10/2022 0.2    Basophils Absolute 09/10/2022 0.0    Sodium 09/10/2022 139    Potassium 09/10/2022 3.9    Chloride 09/10/2022 106    CO2 09/10/2022 26    Glucose, Bld 09/10/2022 122 (H)    BUN 09/10/2022 12    Creatinine, Ser 09/10/2022 1.01    Total Bilirubin 09/10/2022 0.6    Alkaline Phosphatase 09/10/2022 83    AST 09/10/2022 16    ALT 09/10/2022 20    Total Protein 09/10/2022 6.9    Albumin 09/10/2022 4.0    GFR 09/10/2022 67.75    Calcium 09/10/2022 8.9    TSH 09/10/2022 3.58    Color, Urine 09/10/2022 YELLOW    APPearance 09/10/2022 CLEAR    Specific Gravity, Urine 09/10/2022 1.025    pH 09/10/2022 6.0    Total Protein, Urine 09/10/2022 NEGATIVE    Urine Glucose 09/10/2022 NEGATIVE    Ketones, ur 09/10/2022 NEGATIVE    Bilirubin Urine 09/10/2022 NEGATIVE    Hgb urine dipstick 09/10/2022 SMALL (A)    Urobilinogen, UA 09/10/2022 0.2    Leukocytes,Ua 09/10/2022 NEGATIVE    Nitrite 09/10/2022 NEGATIVE    WBC, UA 09/10/2022 0-2/hpf    RBC / HPF 09/10/2022 3-6/hpf (A)    Squamous Epithelial / HPF 09/10/2022 Rare(0-4/hpf)    Pro B Natriuretic peptid* 09/10/2022 123.0 (H)    INR 09/10/2022 3.4 (H)    Prothrombin Time 09/10/2022 34.2 (H)   Anti-coag visit on 08/08/2022  Component Date Value    INR 08/08/2022 2.4   There may be more visits with results that are not included.   No image results found.   CUP PACEART INCLINIC DEVICE CHECK  Result Date: 11/14/2022 Pacemaker check in clinic. Normal device function. Thresholds, sensing, impedances consistent with previous measurements. Device programmed to maximize longevity. Permanent AF. Device programmed at appropriate safety margins. Histogram distribution appropriate for patient activity level. Estimated longevity 16 months. Patient enrolled in remote follow-up. Patient education completed.  CUP PACEART REMOTE DEVICE CHECK  Result Date: 10/23/2022 Scheduled remote reviewed. Normal device function.  Next remote 91 days. LA, CVRS   VAS [place] LOWER EXTREMITY VENOUS (DVT)  Result Date: 09/11/2022  Lower Venous DVT Study Patient Name:  Shawn [name]  Date of Exam:   09/10/2022 Medical Rec #: [id number]          Accession #:    [id number] Date of Birth: 1935/02/25          Patient Gender: M Patient Age:   55 years Exam Location:  [facility] Procedure:      VAS [place] LOWER EXTREMITY VENOUS (DVT) Referring Phys: Tana Conch --------------------------------------------------------------------------------  Indications: Patient complains of left leg swelling for a few months. He denies chest pain and SOB. Other Indications: History of chronic atrial fibrilliation with pacemaker. Anticoagulation: Coumadin. Comparison Study: None Performing Technologist: Carlos American RVT, RDCS (AE), RDMS  Examination Guidelines: A complete evaluation includes B-mode imaging, spectral Doppler, color Doppler, and power Doppler as needed of all accessible portions of each vessel. Bilateral testing is considered an integral part of a complete examination. Limited examinations for reoccurring indications may be performed as noted. The reflux portion of the exam is performed with the patient in reverse Trendelenburg.   +-----+---------------+---------+-----------+----------+--------------+ RIGHTCompressibilityPhasicitySpontaneityPropertiesThrombus Aging +-----+---------------+---------+-----------+----------+--------------+ CFV  Full           Yes      Yes                                 +-----+---------------+---------+-----------+----------+--------------+   +---------+---------------+---------+-----------+----------+--------------+ LEFT     CompressibilityPhasicitySpontaneityPropertiesThrombus Aging +---------+---------------+---------+-----------+----------+--------------+ CFV      Full           Yes      Yes                                 +---------+---------------+---------+-----------+----------+--------------+ SFJ      Full           Yes      Yes                                 +---------+---------------+---------+-----------+----------+--------------+ FV Prox  Full           Yes      Yes                                 +---------+---------------+---------+-----------+----------+--------------+ FV Mid   Full           Yes      Yes                                 +---------+---------------+---------+-----------+----------+--------------+ FV DistalFull           Yes      Yes                                 +---------+---------------+---------+-----------+----------+--------------+ PFV      Full                                                        +---------+---------------+---------+-----------+----------+--------------+ POP      Full           Yes      Yes                                 +---------+---------------+---------+-----------+----------+--------------+ PTV      Full           Yes      Yes                                 +---------+---------------+---------+-----------+----------+--------------+ PERO     Full           Yes  Yes                                  +---------+---------------+---------+-----------+----------+--------------+ Gastroc  Full                                                        +---------+---------------+---------+-----------+----------+--------------+ GSV      Full           Yes      Yes                                 +---------+---------------+---------+-----------+----------+--------------+ increased pulsatile waveforms noted, which could be an indication of right sided heart failure.   Findings reported to [place], CMA at Dr. Erasmo Leventhal office at 2:25 pm.  Summary: RIGHT: - No evidence of common femoral vein obstruction.  LEFT: - No evidence of deep vein thrombosis in the lower extremity. No indirect evidence of obstruction proximal to the inguinal ligament. - No cystic structure found in the popliteal fossa. - Superficial edema noted in thigh and calf. SEE NOTE ABOVE.  *See table(s) above for measurements and observations. Electronically signed by Waverly Ferrari MD on 09/11/2022 at 6:20:15 PM.    Final       This encounter employed real-time, collaborative documentation. The patient actively reviewed and updated their medical record on a shared screen, ensuring transparency and facilitating joint problem-solving for the problem list, overview, and plan. This approach promotes accurate, informed care. The treatment plan was discussed and reviewed in detail, including medication safety, potential side effects, and all patient questions. We confirmed understanding and comfort with the plan. Follow-up instructions were established, including contacting the office for any concerns, returning if symptoms worsen, persist, or new symptoms develop, and precautions for potential emergency department visits. ----------------------------------------------------- Lula Olszewski, MD  01/14/2023 7:40 PM  [facility]:  [phone]

## 2023-01-15 NOTE — Progress Notes (Signed)
Our abscess drainage culture is sterile it seems..  Looked just like pus when expelled from an abscess... But no organisms seen. Check on patient please Tiffany and make sure his head is healing up.   Let me know if he needs anything or if he is ok to wait to see Dr. Durene Cal in a few days.

## 2023-01-16 ENCOUNTER — Telehealth: Payer: Self-pay | Admitting: Family Medicine

## 2023-01-16 NOTE — Telephone Encounter (Signed)
Patient returned call. Requests to be called. 

## 2023-01-16 NOTE — Progress Notes (Signed)
Ignore prior negative gram stain, the culture did indeed grow staph aures, heavily, and we will know soon if its MRSA or not.   Check in with patient to let him know and see how medication(s) is working.

## 2023-01-17 LAB — WOUND CULTURE
MICRO NUMBER:: 15317498
SPECIMEN QUALITY:: ADEQUATE

## 2023-01-18 NOTE — Progress Notes (Signed)
Probably the bacteria from the wound is sensitive to the keflex based on final report oxacillin sensitive. He sees you in a few days.

## 2023-01-22 ENCOUNTER — Ambulatory Visit: Payer: Medicare HMO

## 2023-01-22 DIAGNOSIS — I482 Chronic atrial fibrillation, unspecified: Secondary | ICD-10-CM | POA: Diagnosis not present

## 2023-01-22 LAB — CUP PACEART REMOTE DEVICE CHECK
Battery Impedance: 4306 Ohm
Battery Remaining Longevity: 12 mo
Battery Voltage: 2.7 V
Brady Statistic RV Percent Paced: 98 %
Date Time Interrogation Session: 20240820074909
Implantable Lead Connection Status: 753985
Implantable Lead Connection Status: 753985
Implantable Lead Implant Date: 20081007
Implantable Lead Implant Date: 20081007
Implantable Lead Location: 753859
Implantable Lead Location: 753860
Implantable Lead Model: 5076
Implantable Lead Model: 5076
Implantable Pulse Generator Implant Date: 20160714
Lead Channel Impedance Value: 0 Ohm
Lead Channel Impedance Value: 470 Ohm
Lead Channel Pacing Threshold Amplitude: 0.625 V
Lead Channel Pacing Threshold Pulse Width: 0.4 ms
Lead Channel Setting Pacing Amplitude: 2 V
Lead Channel Setting Pacing Pulse Width: 0.4 ms
Lead Channel Setting Sensing Sensitivity: 1.4 mV
Zone Setting Status: 755011

## 2023-01-23 ENCOUNTER — Encounter: Payer: Self-pay | Admitting: Family Medicine

## 2023-01-23 ENCOUNTER — Ambulatory Visit (INDEPENDENT_AMBULATORY_CARE_PROVIDER_SITE_OTHER): Payer: Medicare HMO | Admitting: Family Medicine

## 2023-01-23 VITALS — BP 138/70 | HR 87 | Temp 97.1°F | Ht 67.0 in | Wt 208.2 lb

## 2023-01-23 DIAGNOSIS — E785 Hyperlipidemia, unspecified: Secondary | ICD-10-CM

## 2023-01-23 DIAGNOSIS — I2510 Atherosclerotic heart disease of native coronary artery without angina pectoris: Secondary | ICD-10-CM | POA: Diagnosis not present

## 2023-01-23 DIAGNOSIS — D485 Neoplasm of uncertain behavior of skin: Secondary | ICD-10-CM | POA: Diagnosis not present

## 2023-01-23 DIAGNOSIS — E669 Obesity, unspecified: Secondary | ICD-10-CM | POA: Diagnosis not present

## 2023-01-23 DIAGNOSIS — Z131 Encounter for screening for diabetes mellitus: Secondary | ICD-10-CM | POA: Diagnosis not present

## 2023-01-23 DIAGNOSIS — C4442 Squamous cell carcinoma of skin of scalp and neck: Secondary | ICD-10-CM | POA: Diagnosis not present

## 2023-01-23 DIAGNOSIS — I10 Essential (primary) hypertension: Secondary | ICD-10-CM | POA: Diagnosis not present

## 2023-01-23 LAB — COMPREHENSIVE METABOLIC PANEL
ALT: 17 U/L (ref 0–53)
AST: 16 U/L (ref 0–37)
Albumin: 4.1 g/dL (ref 3.5–5.2)
Alkaline Phosphatase: 69 U/L (ref 39–117)
BUN: 15 mg/dL (ref 6–23)
CO2: 30 mEq/L (ref 19–32)
Calcium: 9.3 mg/dL (ref 8.4–10.5)
Chloride: 104 mEq/L (ref 96–112)
Creatinine, Ser: 1.05 mg/dL (ref 0.40–1.50)
GFR: 64.5 mL/min (ref >60.00)
Glucose, Bld: 193 mg/dL — ABNORMAL HIGH (ref 70–99)
Potassium: 3.8 mEq/L (ref 3.5–5.1)
Sodium: 140 mEq/L (ref 135–145)
Total Bilirubin: 0.8 mg/dL (ref 0.2–1.2)
Total Protein: 7.2 g/dL (ref 6.0–8.3)

## 2023-01-23 LAB — LIPID PANEL
Cholesterol: 94 mg/dL (ref 0–200)
HDL: 32.3 mg/dL — ABNORMAL LOW (ref >39.00)
LDL Cholesterol: 39 mg/dL (ref 0–99)
NonHDL: 61.5
Total CHOL/HDL Ratio: 3
Triglycerides: 114 mg/dL (ref 0.0–149.0)
VLDL: 22.8 mg/dL (ref 0.0–40.0)

## 2023-01-23 LAB — CBC WITH DIFFERENTIAL/PLATELET
Basophils Absolute: 0 10*3/uL (ref 0.0–0.1)
Basophils Relative: 0.5 % (ref 0.0–3.0)
Eosinophils Absolute: 0.3 10*3/uL (ref 0.0–0.7)
Eosinophils Relative: 3.2 % (ref 0.0–5.0)
HCT: 40.6 % (ref 39.0–52.0)
Hemoglobin: 13.9 g/dL (ref 13.0–17.0)
Lymphocytes Relative: 24.5 % (ref 12.0–46.0)
Lymphs Abs: 1.9 10*3/uL (ref 0.7–4.0)
MCHC: 34.2 g/dL (ref 30.0–36.0)
MCV: 91.6 fl (ref 78.0–100.0)
Monocytes Absolute: 0.6 10*3/uL (ref 0.1–1.0)
Monocytes Relative: 7.8 % (ref 3.0–12.0)
Neutro Abs: 5 10*3/uL (ref 1.4–7.7)
Neutrophils Relative %: 64 % (ref 43.0–77.0)
Platelets: 156 10*3/uL (ref 150.0–400.0)
RBC: 4.44 Mil/uL (ref 4.22–5.81)
RDW: 14.1 % (ref 11.5–15.5)
WBC: 7.8 10*3/uL (ref 4.0–10.5)

## 2023-01-23 LAB — HEMOGLOBIN A1C: Hgb A1c MFr Bld: 7 % — ABNORMAL HIGH (ref 4.6–6.5)

## 2023-01-23 MED ORDER — FUROSEMIDE 20 MG PO TABS: ORAL_TABLET | ORAL | 3 refills | Status: DC

## 2023-01-23 NOTE — Patient Instructions (Addendum)
Let us know if you get your flu or COVID vaccine this fall.  -looking on top of his head appears to have 1.9 x 1.8 cm likely basal cell carcinoma- encouraged him to stop by dermatology office he is seeing near [place] and ask for cancellation- I think he needs to have this looked at as soon as possible instead of waiting until mid September if possible  - weight up 6 lbs in last week and encouraged him to take an extra lasix today  Please stop by lab before you go If you have mychart- we will send your results within 3 business days of [place] receiving them.  If you do not have mychart- we will call you about results within 5 business days of [place] receiving them.  *please also note that you will see labs on mychart as soon as they post. I will later go in and write notes on them- will say "notes from Dr. Durene Cal"   Recommended follow up: Return in about 4 months (around 05/25/2023) for followup or sooner if needed.Schedule b4 you leave.

## 2023-01-23 NOTE — Progress Notes (Addendum)
Phone [phone] In person visit   Subjective:   Shawn [name] is a 88 y.o. year old very pleasant male patient who presents for/with See problem oriented charting Chief Complaint  Patient presents with   Medical Management of Chronic Issues   Hyperlipidemia   Hypertension   place on top of head    Wants to f/u on skin infection    Past Medical History-  Patient Active Problem List   Diagnosis Date Noted   Complete heart block (HCC) 04/02/2019    Priority: High   CAD, NATIVE VESSEL 09/27/2008    Priority: High   PACEMAKER, PERMANENT 03/11/2007    Priority: High   Atrial fibrillation (HCC) 02/11/2007    Priority: High   Benign paroxysmal positional vertigo of right ear 09/24/2019    Priority: Medium    Status post total right knee replacement 08/11/2019    Priority: Medium    History of adenomatous polyp of colon 09/29/2014    Priority: Medium    BPH associated with nocturia 09/09/2014    Priority: Medium    Hyperlipidemia 03/10/2008    Priority: Medium    Essential hypertension 12/02/2006    Priority: Medium    Aortic atherosclerosis (HCC) 10/28/2018    Priority: Low   Long term (current) use of anticoagulants 02/18/2017    Priority: Low   Encounter for therapeutic drug monitoring 09/24/2013    Priority: Low   History of thrombocytopenia 06/17/2011    Priority: Low   Hx of skin cancer, basal cell 04/07/2008    Priority: Low   Actinic keratosis 04/07/2008    Priority: Low   Allergic rhinitis 12/02/2006    Priority: Low   GERD 12/02/2006    Priority: Low   Leg swelling 12/02/2022   Benign microscopic hematuria 10/02/2018    Medications- reviewed and updated Current Outpatient Medications  Medication Sig Dispense Refill   acetaminophen (TYLENOL) 325 MG tablet Take 325-650 mg by mouth every 6 (six) hours as needed (pain).     albuterol (VENTOLIN HFA) 108 (90 Base) MCG/ACT inhaler Inhale 2 puffs into the lungs every 6 (six) hours as needed for wheezing  or shortness of breath (With acute illness. no asthma or COPD history). 1 each 0   atorvastatin (LIPITOR) 40 MG tablet TAKE 1 TABLET EVERY DAY AT 6PM 90 tablet 3   brimonidine (ALPHAGAN) 0.2 % ophthalmic solution Place 1 drop into both eyes in the morning and at bedtime.     diltiazem (CARDIZEM CD) 240 MG 24 hr capsule TAKE 1 CAPSULE EVERY DAY 90 capsule 3   finasteride (PROSCAR) 5 MG tablet TAKE 1 TABLET EVERY DAY 90 tablet 10   fluticasone (FLONASE) 50 MCG/ACT nasal spray Place 2 sprays into both nostrils daily. 48 g 3   metoprolol succinate (TOPROL-XL) 50 MG 24 hr tablet TAKE 1 AND 1/2 TABLETS EVERY DAY. TAKE WITH OR IMMEDIATELY FOLLOWING A MEAL. 135 tablet 3   Multiple Vitamins-Minerals (MULTIVITAMIN & MINERAL PO) Take 1 tablet by mouth every other day.      nitroGLYCERIN (NITROSTAT) 0.4 MG SL tablet Place 1 tablet (0.4 mg total) under the tongue every 5 (five) minutes as needed for chest pain. (Patient taking differently: Place 0.4 mg under the tongue every 5 (five) minutes as needed for chest pain. On hand) 25 tablet 1   tamsulosin (FLOMAX) 0.4 MG CAPS capsule TAKE 2 CAPSULES EVERY DAY 180 capsule 3   warfarin (COUMADIN) 2.5 MG tablet TAKE 2 TABLET BY MOUTH DAILY OR TAKE  AS DIRECTED BY ANTICOAGULATION CLINIC 200 tablet 3   furosemide (LASIX) 20 MG tablet Take Lasix 20 MG on Tuesday and Friday. Take Lasix 20 MG as needed for weight gain of 3 pounds or more on Saturday, Sunday Monday, and Wednesday. 10 tablet 3   No current facility-administered medications for this visit.     Objective:  BP 138/70   Pulse 87   Temp (!) 97.1 F (36.2 C)   Ht 5\' 7"  (1.702 m)   Wt 208 lb 3.2 oz (94.4 kg)   SpO2 98%   BMI 32.61 kg/m  Gen: NAD, resting comfortably CV: RRR no murmurs rubs or gallops Lungs: CTAB no crackles, wheeze, rhonchi Ext: 1+ edema left greater than right Skin: warm, dry, 1.8 x 1.9 cm raised lesion with some ulceration- question basal cell carcinoma vs squamous cell carcinoma      Assessment and Plan    # Skin infection-patient saw Dr. Lattie Corns on 01/14/2023 and diagnosed with scalp lesion with infection-recent trauma to the area likely lead to infection-purulent drainage was noted during visit.  Wound culture was obtained and eventually stood Staph aureus.  Treated with Keflex 3 times a day for 10 days.  Has upcoming dermatology visit for lesion removal.  Apparently the area had an abscess which was able to be drained without lancing. -prior redness and purulence has resolved with anbtiotic- finished yesterday -looking on top of his head appears to have 1.9 x 1.8 cm likely basal cell carcinoma- encouraged him to stop by dermatology office he is seeing near [place] and ask for cancellation- I think he needs to have this looked at as soon as possible instead of waiting until mid September if possible   #CAD with history of stents #hyperlipidemia # Aortic atherosclerosis S: Medication: Atorvastatin 40Mg , Coumadin (not on aspirin as a result) -no chest pain or shortness of breath  Lab Results  Component Value Date   CHOL 88 12/13/2021   HDL 33.80 (L) 12/13/2021   LDLCALC 27 12/13/2021   LDLDIRECT 35.0 06/20/2022   TRIG 137.0 12/13/2021   CHOLHDL 3 12/13/2021   A/P: coronary artery disease asymptomatic continue current medications   # Atrial fibrillation with pacemaker due to history of tachybradycardia and complete heart block syndrome-follows Dr. Ladona Ridgel and Dr. Antoine Poche S: Rate controlled with diltiazem 240 mg extended release, metoprolol 75 mg extended release Anticoagulated with Coumadin-follows with [facility] A/P: appropriately anticoagulated and rate controlled- continue current medicine   #hypertension S: medication: Metoprolol 75 mg extended release, diltiazem 240 mg extended release, also on Lasix twice a week to help with edema through Dr. Antoine Poche- weight up 6 lbs in last week and encouraged him to take an extra lasix today thought he did eat well  for his bday and wonders if that contributes BP Readings from Last 3 Encounters:  01/23/23 138/70  01/14/23 116/72  12/03/22 (!) 140/68  A/P: blood pressure high acceptable but seems to have higher weight and going to take extra lasix- suspect that may help  #BPH S: Medication: Finasteride 5 mg daily, tamsulosin 0.8 mg daily- more issues with more lasix  A/P: imperfect control but likely worse without medications - continue current medications    #screen diabetes with obesity   Recommended follow up: Return in about 4 months (around 05/25/2023) for followup or sooner if needed.Schedule b4 you leave. Future Appointments  Date Time Provider Department Center  01/30/2023  9:30 AM [facility]  04/23/2023  7:00 AM [facility] DEVICE REMOTES  [facility]  05/07/2023 10:30 AM Joylene Grapes, NP [facility] None  07/23/2023  7:00 AM [facility] DEVICE REMOTES [facility]  10/22/2023  7:00 AM [facility] DEVICE REMOTES [facility]  11/18/2023 11:45 AM [facility] ANNUAL WELLNESS VISIT 1 [facility]  01/21/2024  7:00 AM [facility] DEVICE REMOTES [facility]  04/21/2024  7:00 AM [facility] DEVICE REMOTES [facility]   Lab/Order associations: cheerios this morning and banana   ICD-10-CM   1. Atherosclerosis of native coronary artery of native heart without angina pectoris  I25.10     2. Essential hypertension  I10     3. Hyperlipidemia, unspecified hyperlipidemia type  E78.5 Comprehensive metabolic panel    CBC with Differential/Platelet    Lipid panel    4. Obesity (BMI 30-39.9)  E66.9 HgB A1c    5. Screening for diabetes mellitus  Z13.1 HgB A1c      Meds ordered this encounter  Medications   furosemide (LASIX) 20 MG tablet    Sig: Take Lasix 20 MG on Tuesday and Friday. Take Lasix 20 MG as needed for weight gain of 3 pounds or more on Saturday, Sunday Monday, and Wednesday.    Dispense:  10 tablet     Refill:  3    Return precautions advised.  Tana Conch, MD

## 2023-01-24 IMAGING — DX DG CLAVICLE*L*
2 series · 2 of 2 positions shown · non-contrast
Comparison: None.

CLINICAL DATA: Left clavicle pain for 1 month.  No known injury.

EXAM:
LEFT CLAVICLE - 2+ VIEWS

[clavicle ap]
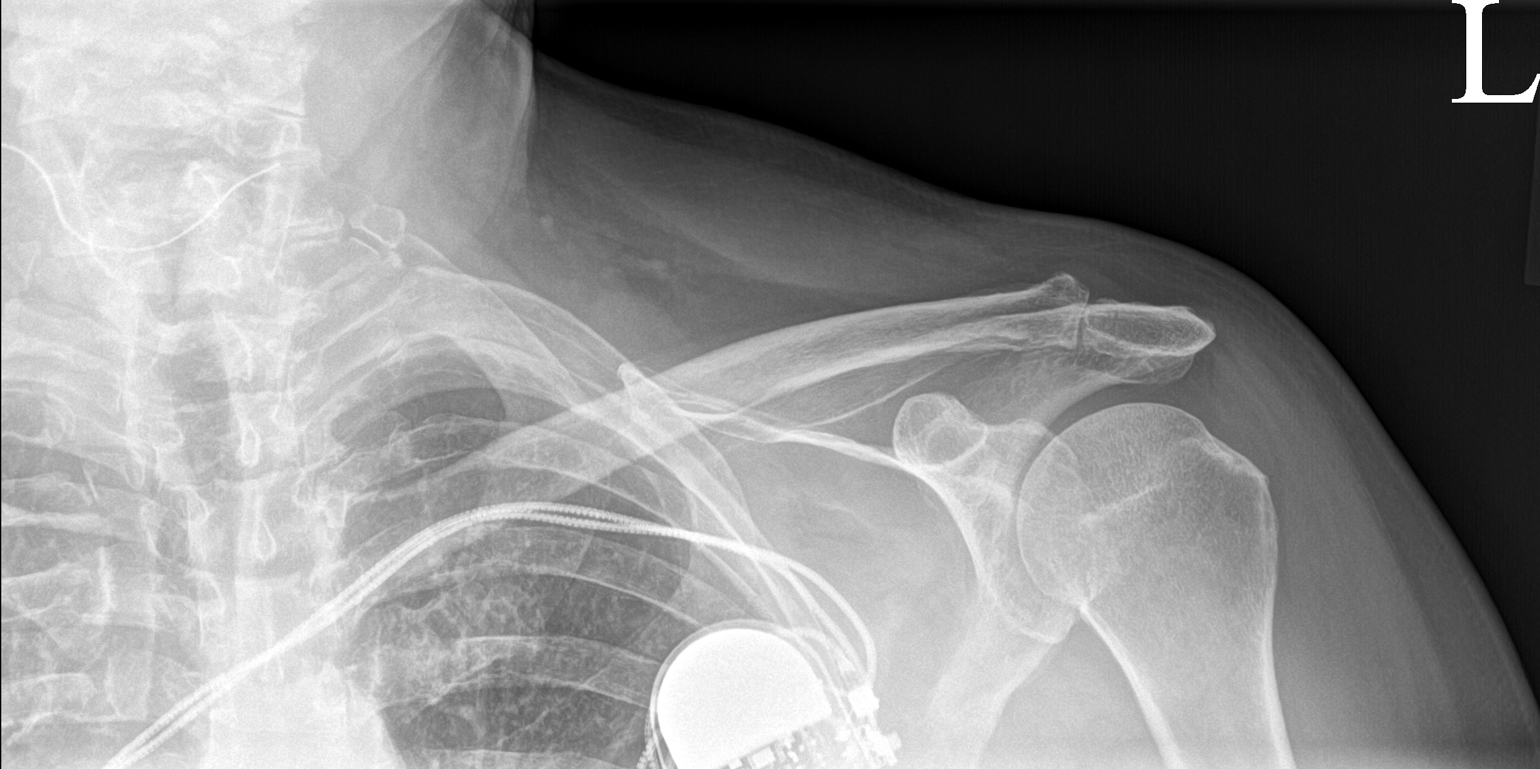

[clavicle axial]
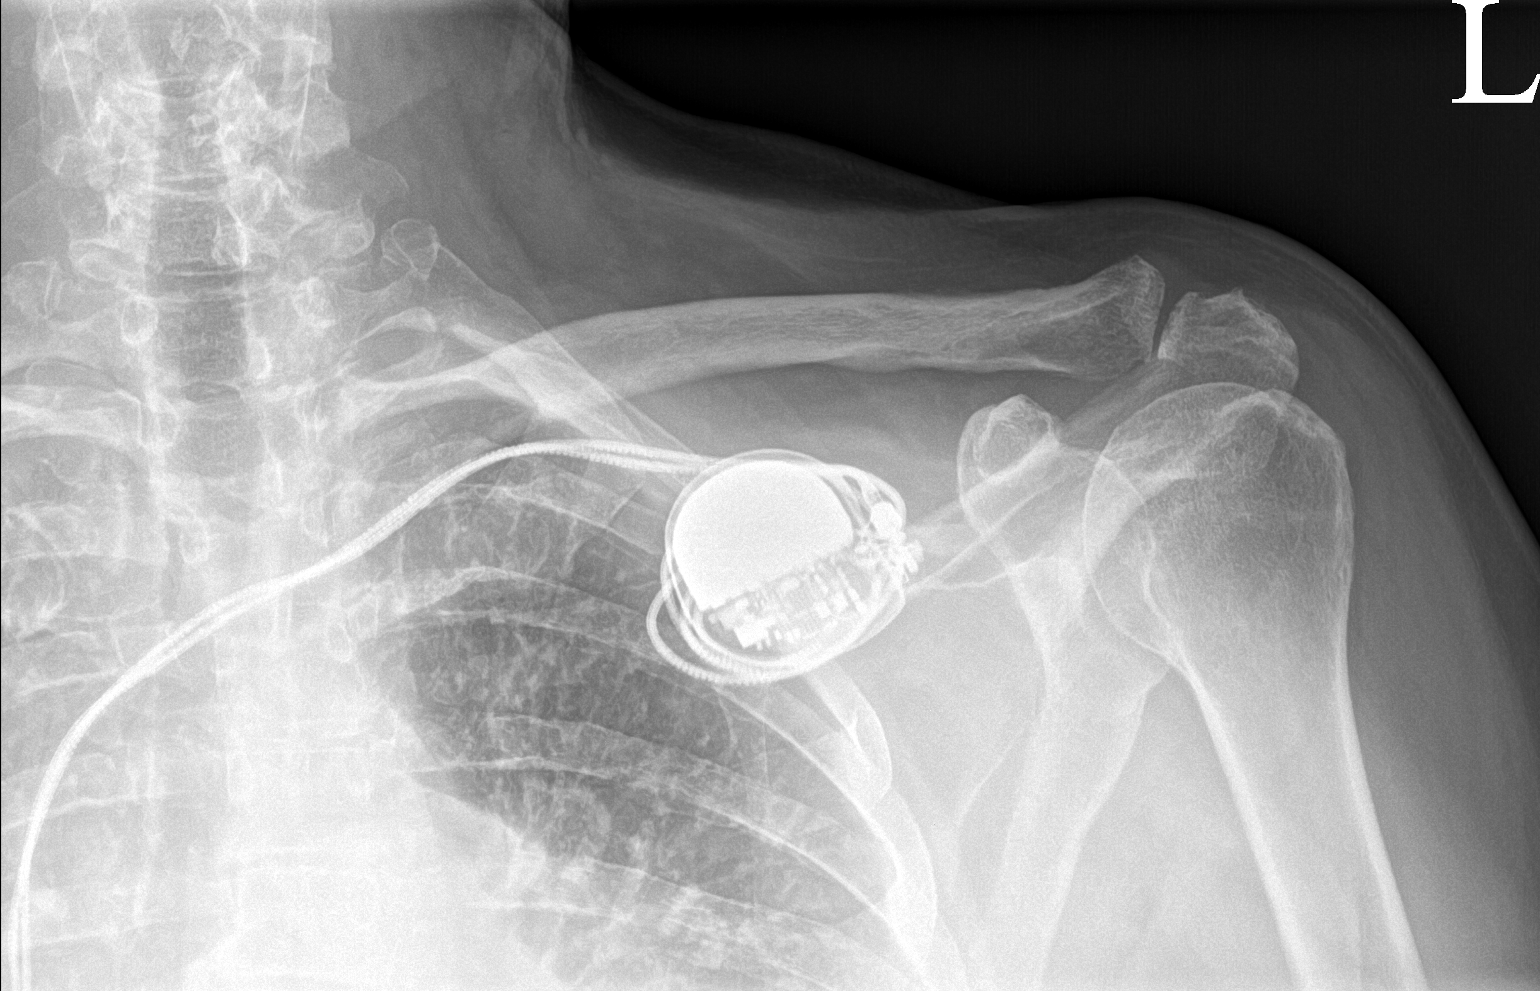

[2 of 2 positions shown; findings below may reference images not displayed]

FINDINGS: Cortical margins of the clavicle are intact. There is no evidence of
fracture or other focal bone lesions. Minimal acromioclavicular
spurring. Soft tissues are unremarkable. Left-sided pacemaker is
partially included.
IMPRESSION: Minimal acromioclavicular spurring.

## 2023-01-30 ENCOUNTER — Ambulatory Visit (INDEPENDENT_AMBULATORY_CARE_PROVIDER_SITE_OTHER): Payer: Medicare HMO

## 2023-01-30 DIAGNOSIS — Z7901 Long term (current) use of anticoagulants: Secondary | ICD-10-CM

## 2023-01-30 LAB — POCT INR: INR: 2.5 (ref 2.0–3.0)

## 2023-01-30 NOTE — Progress Notes (Signed)
I have reviewed and agree with note, evaluation, plan.   Stephen Hunter, MD  

## 2023-01-30 NOTE — Progress Notes (Signed)
Continue 1 tablet daily except 2 tablets on Mondays. Recheck in 6 weeks.

## 2023-01-30 NOTE — Patient Instructions (Addendum)
Pre visit review using our clinic review tool, if applicable. No additional management support is needed unless otherwise documented below in the visit note.  Continue 1 tablet daily except 2 tablets on Mondays. Recheck in 6 weeks.

## 2023-02-01 NOTE — Progress Notes (Signed)
Remote pacemaker transmission.   

## 2023-02-08 ENCOUNTER — Telehealth: Payer: Self-pay | Admitting: Family Medicine

## 2023-02-08 NOTE — Telephone Encounter (Signed)
Pt came in office wanting to be seen at 3:30pm  but no availability. Pt has large lump or mole on top of head that is seeping.  Unable to get in to skin dr due to closing at 1pm. Advised to go to urgent care. Pt decided to go to [facility]. Will call skin dr on Monday but if no available appointments, pt will call our office.

## 2023-02-11 DIAGNOSIS — D485 Neoplasm of uncertain behavior of skin: Secondary | ICD-10-CM | POA: Diagnosis not present

## 2023-02-11 DIAGNOSIS — L814 Other melanin hyperpigmentation: Secondary | ICD-10-CM | POA: Diagnosis not present

## 2023-02-11 DIAGNOSIS — Z85828 Personal history of other malignant neoplasm of skin: Secondary | ICD-10-CM | POA: Diagnosis not present

## 2023-02-11 DIAGNOSIS — Z08 Encounter for follow-up examination after completed treatment for malignant neoplasm: Secondary | ICD-10-CM | POA: Diagnosis not present

## 2023-02-11 DIAGNOSIS — C4442 Squamous cell carcinoma of skin of scalp and neck: Secondary | ICD-10-CM | POA: Diagnosis not present

## 2023-02-11 DIAGNOSIS — L821 Other seborrheic keratosis: Secondary | ICD-10-CM | POA: Diagnosis not present

## 2023-02-11 DIAGNOSIS — C44329 Squamous cell carcinoma of skin of other parts of face: Secondary | ICD-10-CM | POA: Diagnosis not present

## 2023-02-11 DIAGNOSIS — D225 Melanocytic nevi of trunk: Secondary | ICD-10-CM | POA: Diagnosis not present

## 2023-02-18 DIAGNOSIS — C4442 Squamous cell carcinoma of skin of scalp and neck: Secondary | ICD-10-CM | POA: Diagnosis not present

## 2023-02-25 DIAGNOSIS — C4442 Squamous cell carcinoma of skin of scalp and neck: Secondary | ICD-10-CM | POA: Diagnosis not present

## 2023-03-04 DIAGNOSIS — H04123 Dry eye syndrome of bilateral lacrimal glands: Secondary | ICD-10-CM | POA: Diagnosis not present

## 2023-03-04 DIAGNOSIS — Z961 Presence of intraocular lens: Secondary | ICD-10-CM | POA: Diagnosis not present

## 2023-03-04 DIAGNOSIS — H4031X2 Glaucoma secondary to eye trauma, right eye, moderate stage: Secondary | ICD-10-CM | POA: Diagnosis not present

## 2023-03-04 DIAGNOSIS — H1131 Conjunctival hemorrhage, right eye: Secondary | ICD-10-CM | POA: Diagnosis not present

## 2023-03-06 DIAGNOSIS — C4442 Squamous cell carcinoma of skin of scalp and neck: Secondary | ICD-10-CM | POA: Diagnosis not present

## 2023-03-13 ENCOUNTER — Ambulatory Visit (INDEPENDENT_AMBULATORY_CARE_PROVIDER_SITE_OTHER): Payer: Medicare HMO

## 2023-03-13 DIAGNOSIS — Z7901 Long term (current) use of anticoagulants: Secondary | ICD-10-CM | POA: Diagnosis not present

## 2023-03-13 LAB — POCT INR: INR: 3.1 — AB (ref 2.0–3.0)

## 2023-03-13 NOTE — Progress Notes (Signed)
Pt had squamous cell carcinoma removed from top of scalp. Pt is doing well after the surgery. Warfarin was not held. Eat some greens today and then continue 1 tablet daily except 2 tablets on Mondays. Recheck in 4 weeks.

## 2023-03-13 NOTE — Patient Instructions (Addendum)
Pre visit review using our clinic review tool, if applicable. No additional management support is needed unless otherwise documented below in the visit note.  Eat some greens today and then continue 1 tablet daily except 2 tablets on Mondays. Recheck in 4 weeks.

## 2023-03-13 NOTE — Progress Notes (Signed)
I have reviewed the patient's encounter and agree with the documentation.  Katina Degree. Jimmey Ralph, MD 03/13/2023 10:27 AM

## 2023-03-20 DIAGNOSIS — Z4801 Encounter for change or removal of surgical wound dressing: Secondary | ICD-10-CM | POA: Diagnosis not present

## 2023-03-20 DIAGNOSIS — Z48817 Encounter for surgical aftercare following surgery on the skin and subcutaneous tissue: Secondary | ICD-10-CM | POA: Diagnosis not present

## 2023-04-03 ENCOUNTER — Other Ambulatory Visit: Payer: Self-pay | Admitting: Family Medicine

## 2023-04-10 ENCOUNTER — Ambulatory Visit: Payer: Medicare HMO

## 2023-04-10 DIAGNOSIS — Z23 Encounter for immunization: Secondary | ICD-10-CM | POA: Diagnosis not present

## 2023-04-10 DIAGNOSIS — Z7901 Long term (current) use of anticoagulants: Secondary | ICD-10-CM

## 2023-04-10 LAB — POCT INR: INR: 2.8 (ref 2.0–3.0)

## 2023-04-10 NOTE — Patient Instructions (Addendum)
Pre visit review using our clinic review tool, if applicable. No additional management support is needed unless otherwise documented below in the visit note.  Continue 1 tablet daily except 2 tablets on Mondays. Recheck in 6 weeks.

## 2023-04-10 NOTE — Progress Notes (Addendum)
Continue 1 tablet daily except 2 tablets on Mondays. Recheck in 6 weeks.  Pt requested high dose flu vaccine. Administered vaccine. Pt tolerated well.

## 2023-04-10 NOTE — Progress Notes (Signed)
I have reviewed and agree with note, evaluation, plan.   Jeffory Snelgrove, MD  

## 2023-04-23 ENCOUNTER — Ambulatory Visit (INDEPENDENT_AMBULATORY_CARE_PROVIDER_SITE_OTHER): Payer: Medicare HMO

## 2023-04-23 DIAGNOSIS — I482 Chronic atrial fibrillation, unspecified: Secondary | ICD-10-CM

## 2023-04-23 LAB — CUP PACEART REMOTE DEVICE CHECK
Battery Impedance: 4329 Ohm
Battery Remaining Longevity: 12 mo
Battery Voltage: 2.7 V
Brady Statistic RV Percent Paced: 98 %
Date Time Interrogation Session: 20241119102132
Implantable Lead Connection Status: 753985
Implantable Lead Connection Status: 753985
Implantable Lead Implant Date: 20081007
Implantable Lead Implant Date: 20081007
Implantable Lead Location: 753859
Implantable Lead Location: 753860
Implantable Lead Model: 5076
Implantable Lead Model: 5076
Implantable Pulse Generator Implant Date: 20160714
Lead Channel Impedance Value: 0 Ohm
Lead Channel Impedance Value: 480 Ohm
Lead Channel Pacing Threshold Amplitude: 0.625 V
Lead Channel Pacing Threshold Pulse Width: 0.4 ms
Lead Channel Setting Pacing Amplitude: 2 V
Lead Channel Setting Pacing Pulse Width: 0.4 ms
Lead Channel Setting Sensing Sensitivity: 1.4 mV
Zone Setting Status: 755011

## 2023-04-24 DIAGNOSIS — S0100XA Unspecified open wound of scalp, initial encounter: Secondary | ICD-10-CM | POA: Diagnosis not present

## 2023-04-24 DIAGNOSIS — C44329 Squamous cell carcinoma of skin of other parts of face: Secondary | ICD-10-CM | POA: Diagnosis not present

## 2023-05-07 ENCOUNTER — Encounter: Payer: Self-pay | Admitting: Nurse Practitioner

## 2023-05-07 ENCOUNTER — Ambulatory Visit: Payer: Medicare HMO | Attending: Nurse Practitioner | Admitting: Nurse Practitioner

## 2023-05-07 VITALS — BP 128/60 | HR 78 | Ht 67.0 in | Wt 206.8 lb

## 2023-05-07 DIAGNOSIS — E785 Hyperlipidemia, unspecified: Secondary | ICD-10-CM

## 2023-05-07 DIAGNOSIS — R600 Localized edema: Secondary | ICD-10-CM

## 2023-05-07 DIAGNOSIS — I4821 Permanent atrial fibrillation: Secondary | ICD-10-CM

## 2023-05-07 DIAGNOSIS — I10 Essential (primary) hypertension: Secondary | ICD-10-CM

## 2023-05-07 DIAGNOSIS — Z950 Presence of cardiac pacemaker: Secondary | ICD-10-CM

## 2023-05-07 DIAGNOSIS — I2510 Atherosclerotic heart disease of native coronary artery without angina pectoris: Secondary | ICD-10-CM

## 2023-05-07 MED ORDER — NITROGLYCERIN 0.4 MG SL SUBL: 0.4000 mg | SUBLINGUAL_TABLET | SUBLINGUAL | 3 refills | Status: AC | PRN

## 2023-05-07 NOTE — Patient Instructions (Signed)
Medication Instructions:  Your physician recommends that you continue on your current medications as directed. Please refer to the Current Medication list given to you today.  *If you need a refill on your cardiac medications before your next appointment, please call your pharmacy*   Lab Work: NONE ordered at this time of appointment    Testing/Procedures: NONE ordered at this time of appointment     Follow-Up: At [facility], you and your health needs are our priority.  As part of our continuing mission to provide you with exceptional heart care, we have created designated Provider Care Teams.  These Care Teams include your primary Cardiologist (physician) and Advanced Practice Providers (APPs -  Physician Assistants and Nurse Practitioners) who all work together to provide you with the care you need, when you need it.  We recommend signing up for the patient portal called "MyChart".  Sign up information is provided on this After Visit Summary.  MyChart is used to connect with patients for Virtual Visits (Telemedicine).  Patients are able to view lab/test results, encounter notes, upcoming appointments, etc.  Non-urgent messages can be sent to your provider as well.   To learn more about what you can do with MyChart, go to [email or web address].    Your next appointment:   6 month(s)  Provider:   Rollene Rotunda, MD     Other Instructions

## 2023-05-07 NOTE — Progress Notes (Signed)
Office Visit    Patient Name: Shawn [name] Date of Encounter: 05/07/2023  Primary Care Provider:  Shelva Majestic, MD Primary Cardiologist:  Rollene Rotunda, MD  Chief Complaint    88 year old male with a history of CAD s/p DES x 2-RCA in 2004, PTCA-RCA in 2008, [place] in 2012 with non-DES, permanent atrial fibrillation, tachycardia-bradycardia syndrome s/p PPM, hypertension, hyperlipidemia, and chronic lower extremity edema who presents for follow-up related to CAD and atrial fibrillation.  Past Medical History    Past Medical History:  Diagnosis Date   Actinic keratosis    Adenomatous colon polyp    ALLERGIC RHINITIS    Arthritis    CAD, NATIVE VESSEL    a. DES x2 to RCA 2004 b. repeat PTCA to RCA 2008 c. PCI to RCA 2012 non-DES    Cellulitis and abscess of trunk    Diverticulosis    Dysrhythmia    GERD (gastroesophageal reflux disease)    HYPERLIPIDEMIA    HYPERTENSION    OTH MALIG NEOPLASM SKIN OTH\T\UNSPEC PARTS FACE    Permanent atrial fibrillation (HCC)    PONV (postoperative nausea and vomiting)    Presence of permanent cardiac pacemaker    Skin cancer    Tachy-brady syndrome (HCC)    a. s/p MDT dual chamber pacemaker implant complicated by pericardial effusion and micro-perforatation s/p lead revisions   Past Surgical History:  Procedure Laterality Date   BACK SURGERY  2000   "for bulging discs"   CATARACT EXTRACTION W/ INTRAOCULAR LENS  IMPLANT, BILATERAL  2012   CORONARY ANGIOPLASTY WITH STENT PLACEMENT  2008   CORONARY ANGIOPLASTY WITH STENT PLACEMENT  05/14/11   CYSTECTOMY     from right side of face.   EP IMPLANTABLE DEVICE N/A 12/16/2014   Procedure: PPM Generator Changeout;  Surgeon: Marinus Maw, MD;  Location: [facility] INVASIVE CV LAB;  Service: Cardiovascular;  Laterality: N/A;   LEAD REVISION  2008   11 days post initial implant, RA and RV lead revisions for micro-perforation and pericardial effusion by Dr Ladona Ridgel   LEFT HEART CATHETERIZATION  WITH CORONARY ANGIOGRAM N/A 05/14/2011   Procedure: LEFT HEART CATHETERIZATION WITH CORONARY ANGIOGRAM;  Surgeon: Herby Abraham, MD;  Location: [facility] CATH LAB;  Service: Cardiovascular;  Laterality: N/A;   PACEMAKER INSERTION  2008   MDT dual chamber pacemaker implanted by Dr Antoine Poche   TOTAL KNEE ARTHROPLASTY Right 08/11/2019   Procedure: TOTAL KNEE ARTHROPLASTY;  Surgeon: Durene Romans, MD;  Location: [facility];  Service: Orthopedics;  Laterality: Right;  70 mins    Allergies  No Known Allergies   Labs/Other Studies Reviewed    The following studies were reviewed today:  Cardiac Studies & Procedures       ECHOCARDIOGRAM  ECHOCARDIOGRAM COMPLETE 09/12/2022  Narrative ECHOCARDIOGRAM REPORT    Patient Name:   Shawn [name] Date of Exam: 09/12/2022 Medical Rec #:  [id number]         Height:       67.0 in Accession #:    [id number]        Weight:       208.2 lb Date of Birth:  31-Jul-1934         BSA:          2.057 m Patient Age:    87 years          BP:           139/77 mmHg Patient Gender: M  HR:           84 bpm. Exam Location:  [address]  Procedure: 2D Echo, Cardiac Doppler, Color Doppler and Strain Analysis  Indications:    786.05 SOB  History:        Patient has prior history of Echocardiogram examinations, most recent 06/30/2007. CAD, Pacemaker, Arrythmias:Atrial Fibrillation, Signs/Symptoms:Edema; Risk Factors:Hypertension.  Sonographer:    Clearence Ped RCS Referring Phys: [id number] JAMES HOCHREIN  IMPRESSIONS   1. Left ventricular ejection fraction, by estimation, is 55 to 60%. The left ventricle has normal function. The left ventricle has no regional wall motion abnormalities. There is mild concentric left ventricular hypertrophy. Left ventricular diastolic parameters are indeterminate. The average left ventricular global longitudinal strain is -20.4 %. The global longitudinal strain is normal. 2. Right ventricular systolic function is normal. The  right ventricular size is normal. There is mildly elevated pulmonary artery systolic pressure. The estimated right ventricular systolic pressure is 39.1 mmHg. 3. The mitral valve is grossly normal. Trivial mitral valve regurgitation. 4. Tricuspid valve regurgitation is moderate. 5. The aortic valve is tricuspid. There is mild calcification of the aortic valve. Aortic valve regurgitation is not visualized. Aortic valve sclerosis is present, with no evidence of aortic valve stenosis. 6. The inferior vena cava is dilated in size with >50% respiratory variability, suggesting right atrial pressure of 8 mmHg.  Comparison(s): Unable to view prior study (2009).  FINDINGS Left Ventricle: Left ventricular ejection fraction, by estimation, is 55 to 60%. The left ventricle has normal function. The left ventricle has no regional wall motion abnormalities. The average left ventricular global longitudinal strain is -20.4 %. The global longitudinal strain is normal. The left ventricular internal cavity size was small. There is mild concentric left ventricular hypertrophy. Left ventricular diastolic parameters are indeterminate.  Right Ventricle: The right ventricular size is normal. No increase in right ventricular wall thickness. Right ventricular systolic function is normal. There is mildly elevated pulmonary artery systolic pressure. The tricuspid regurgitant velocity is 2.79 m/s, and with an assumed right atrial pressure of 8 mmHg, the estimated right ventricular systolic pressure is 39.1 mmHg.  Left Atrium: Left atrial size was normal in size.  Right Atrium: Right atrial size was normal in size.  Pericardium: Trivial pericardial effusion is present. The pericardial effusion is posterior to the left ventricle.  Mitral Valve: The mitral valve is grossly normal. Trivial mitral valve regurgitation.  Tricuspid Valve: Tricuspid regurgitation around device lead, TR best assessed in clip 61. The tricuspid valve  is normal in structure. Tricuspid valve regurgitation is moderate . No evidence of tricuspid stenosis.  Aortic Valve: The aortic valve is tricuspid. There is mild calcification of the aortic valve. Aortic valve regurgitation is not visualized. Aortic valve sclerosis is present, with no evidence of aortic valve stenosis.  Pulmonic Valve: The pulmonic valve was normal in structure. Pulmonic valve regurgitation is mild. No evidence of pulmonic stenosis.  Aorta: The aortic root and ascending aorta are structurally normal, with no evidence of dilitation.  Venous: The inferior vena cava is dilated in size with greater than 50% respiratory variability, suggesting right atrial pressure of 8 mmHg.  IAS/Shunts: No atrial level shunt detected by color flow Doppler.  Additional Comments: A device lead is visualized in the right atrium.   LEFT VENTRICLE PLAX 2D LVIDd:         3.70 cm   Diastology LVIDs:         2.50 cm   LV e' medial:  8.27 cm/s LV PW:         1.00 cm   LV E/e' medial:  14.9 LV IVS:        1.10 cm   LV e' lateral:   11.30 cm/s LVOT diam:     1.90 cm   LV E/e' lateral: 10.9 LV SV:         65 LV SV Index:   32        2D Longitudinal Strain LVOT Area:     2.84 cm  2D Strain GLS (A2C):   -16.7 % 2D Strain GLS (A3C):   -22.9 % 2D Strain GLS (A4C):   -21.6 % 2D Strain GLS Avg:     -20.4 %  RIGHT VENTRICLE RV Basal diam:  3.80 cm RV S prime:     11.70 cm/s TAPSE (M-mode): 2.0 cm RVSP:           39.1 mmHg  LEFT ATRIUM             Index        RIGHT ATRIUM           Index LA diam:        5.00 cm 2.43 cm/m   RA Pressure: 8.00 mmHg LA Vol (A2C):   58.2 ml 28.29 ml/m  RA Area:     19.50 cm LA Vol (A4C):   48.7 ml 23.67 ml/m  RA Volume:   57.90 ml  28.15 ml/m LA Biplane Vol: 54.5 ml 26.49 ml/m AORTIC VALVE LVOT Vmax:   97.40 cm/s LVOT Vmean:  67.100 cm/s LVOT VTI:    0.230 m  AORTA Ao Root diam: 3.40 cm Ao Asc diam:  3.30 cm  MITRAL VALVE                TRICUSPID  VALVE MV Area (PHT):              TR Peak grad:   31.1 mmHg MV Decel Time:              TR Vmax:        279.00 cm/s MV E velocity: 123.00 cm/s  Estimated RAP:  8.00 mmHg RVSP:           39.1 mmHg  SHUNTS Systemic VTI:  0.23 m Systemic Diam: 1.90 cm  Riley Lam MD Electronically signed by Riley Lam MD Signature Date/Time: 09/12/2022/10:59:15 AM    Final            Recent Labs: 09/10/2022: Pro B Natriuretic peptide (BNP) 123.0; TSH 3.58 01/23/2023: ALT 17; BUN 15; Creatinine, Ser 1.05; Hemoglobin 13.9; Platelets 156.0; Potassium 3.8; Sodium 140  Recent Lipid Panel    Component Value Date/Time   CHOL 94 01/23/2023 1110   CHOL 109 04/16/2017 0923   TRIG 114.0 01/23/2023 1110   HDL 32.30 (L) 01/23/2023 1110   HDL 39 (L) 04/16/2017 0923   CHOLHDL 3 01/23/2023 1110   VLDL 22.8 01/23/2023 1110   LDLCALC 39 01/23/2023 1110   LDLCALC 48 04/16/2017 0923   LDLDIRECT 35.0 06/20/2022 1007    History of Present Illness    88 year old male with the above past medical history including CAD s/p DES x 2-RCA in 2004, PTCA-RCA in 2008, [place] in 2012 with non-DES, permanent atrial fibrillation, tachycardia-bradycardia syndrome s/p PPM, hypertension, hyperlipidemia, and chronic lower extremity edema.  He has a history of CAD with multiple prior interventions as above.  He is on home chronic warfarin for history of permanent atrial fibrillation.  He follows with the EP in the setting of tachycardia-bradycardia syndrome s/p PPM.  He has chronic bilateral lower extremity edema, controlled with oral Lasix.  Most recent echocardiogram in 09/2022 showed EF 55 to 60%, normal LV function, no RWMA, mild concentric LVH, normal RV systolic function, mildly elevated PASP, no significant valvular abnormalities.  He was last seen in the office on 12/03/2022 and was stable from a cardiac standpoint.  He presents today for follow-up.  Since his last visit he has done well from a cardiac  standpoint.  He has stable nonpitting bilateral lower extremity edema, controlled with as needed Lasix.  He denies symptoms concerning for angina, denies dyspnea, PND, orthopnea, weight gain.  Overall, he reports feeling well.  Home Medications    Current Outpatient Medications  Medication Sig Dispense Refill   acetaminophen (TYLENOL) 325 MG tablet Take 325-650 mg by mouth every 6 (six) hours as needed (pain).     albuterol (VENTOLIN HFA) 108 (90 Base) MCG/ACT inhaler Inhale 2 puffs into the lungs every 6 (six) hours as needed for wheezing or shortness of breath (With acute illness. no asthma or COPD history). 1 each 0   atorvastatin (LIPITOR) 40 MG tablet TAKE 1 TABLET EVERY DAY AT 6PM 90 tablet 3   brimonidine (ALPHAGAN) 0.2 % ophthalmic solution Place 1 drop into both eyes in the morning and at bedtime.     diltiazem (CARDIZEM CD) 240 MG 24 hr capsule TAKE 1 CAPSULE EVERY DAY 90 capsule 3   finasteride (PROSCAR) 5 MG tablet TAKE 1 TABLET EVERY DAY 90 tablet 10   fluticasone (FLONASE) 50 MCG/ACT nasal spray Place 2 sprays into both nostrils daily. 48 g 3   furosemide (LASIX) 20 MG tablet TAKE 1 TABLET ON TUES, FRI. TAKE 1 TAB AS NEEDED FOR WEIGHT GAIN OF 3 POUNDS OR MORE ON SAT, SUN, MON, AND WED AS DIRECTED 10 tablet 3   metoprolol succinate (TOPROL-XL) 50 MG 24 hr tablet TAKE 1 AND 1/2 TABLETS EVERY DAY. TAKE WITH OR IMMEDIATELY FOLLOWING A MEAL. 135 tablet 3   Multiple Vitamins-Minerals (MULTIVITAMIN & MINERAL PO) Take 1 tablet by mouth every other day.      tamsulosin (FLOMAX) 0.4 MG CAPS capsule TAKE 2 CAPSULES EVERY DAY 180 capsule 3   warfarin (COUMADIN) 2.5 MG tablet TAKE 2 TABLET BY MOUTH DAILY OR TAKE AS DIRECTED BY ANTICOAGULATION CLINIC (Patient taking differently: 2.5 mg. TAKE 2 TABLET BY MOUTH DAILY OR TAKE AS DIRECTED BY ANTICOAGULATION CLINIC) 200 tablet 3   nitroGLYCERIN (NITROSTAT) 0.4 MG SL tablet Place 1 tablet (0.4 mg total) under the tongue every 5 (five) minutes as needed  for chest pain. 75 tablet 3   No current facility-administered medications for this visit.     Review of Systems    He denies chest pain, palpitations, dyspnea, pnd, orthopnea, n, v, dizziness, syncope, weight gain, or early satiety. All other systems reviewed and are otherwise negative except as noted above.   Physical Exam    VS:  BP 128/60 (BP Location: Left Arm, Patient Position: Sitting, Cuff Size: Normal)   Pulse 78   Ht 5\' 7"  (1.702 m)   Wt 206 lb 12.8 oz (93.8 kg)   SpO2 97%   BMI 32.39 kg/m   GEN: Well nourished, well developed, in no acute distress. HEENT: normal. Neck: Supple, no JVD, carotid bruits, or masses. Cardiac: RRR, no murmurs, rubs, or gallops. No clubbing, cyanosis, nonpitting bilateral lower extremity edema.  Radials/DP/PT 2+ and equal bilaterally.  Respiratory:  Respirations regular and unlabored, clear to auscultation bilaterally. GI: Soft, nontender, nondistended, BS + x 4. MS: no deformity or atrophy. Skin: warm and dry, no rash. Neuro:  Strength and sensation are intact. Psych: Normal affect.  Accessory Clinical Findings    ECG personally reviewed by me today - EKG Interpretation Date/Time:  Tuesday May 07 2023 10:34:25 EST Ventricular Rate:  78 PR Interval:    QRS Duration:  152 QT Interval:  412 QTC Calculation: 469 R Axis:   -4  Text Interpretation: Ventricular-paced rhythm When compared with ECG of 15-May-2011 05:21, Electronic ventricular pacemaker has replaced Atrial fibrillation Confirmed by Bernadene Person ([id number]) on 05/07/2023 10:35:20 AM  - no acute changes.   Lab Results  Component Value Date   WBC 7.8 01/23/2023   HGB 13.9 01/23/2023   HCT 40.6 01/23/2023   MCV 91.6 01/23/2023   PLT 156.0 01/23/2023   Lab Results  Component Value Date   CREATININE 1.05 01/23/2023   BUN 15 01/23/2023   NA 140 01/23/2023   K 3.8 01/23/2023   CL 104 01/23/2023   CO2 30 01/23/2023   Lab Results  Component Value Date   ALT 17 01/23/2023    AST 16 01/23/2023   ALKPHOS 69 01/23/2023   BILITOT 0.8 01/23/2023   Lab Results  Component Value Date   CHOL 94 01/23/2023   HDL 32.30 (L) 01/23/2023   LDLCALC 39 01/23/2023   LDLDIRECT 35.0 06/20/2022   TRIG 114.0 01/23/2023   CHOLHDL 3 01/23/2023    Lab Results  Component Value Date   HGBA1C 7.0 (H) 01/23/2023    Assessment & Plan    1. CAD:  S/p DES x 2-RCA in 2004, PTCA-RCA in 2008, [place] in 2012 with non-DES. Stable with no anginal symptoms. No indication for ischemic evaluation.  Continue metoprolol, diltiazem, Lipitor.  No ASA in the setting of chronic warfarin.  2. Permanent atrial fibrillation: EKG today shows V paced rhythm.  Continue diltiazem, metoprolol, warfarin.   3. Tachycardia-bradycardia syndrome: S/p PPM, most recent device check showed normal device function.  Following with EP.   4. Hypertension: BP well controlled. Continue current antihypertensive regimen.   5. Hyperlipidemia: LDL was 39 in 01/2023.  Continue Lipitor.  6. Chronic bilateral lower extremity edema: Controlled with PRN Lasix.   7. Disposition: Follow-up in 6 months.       Joylene Grapes, NP 05/07/2023, 10:52 AM

## 2023-05-20 NOTE — Progress Notes (Signed)
Remote pacemaker transmission.   

## 2023-05-22 ENCOUNTER — Ambulatory Visit: Payer: Medicare HMO

## 2023-05-22 DIAGNOSIS — Z7901 Long term (current) use of anticoagulants: Secondary | ICD-10-CM

## 2023-05-22 LAB — POCT INR: INR: 3.4 — AB (ref 2.0–3.0)

## 2023-05-22 NOTE — Progress Notes (Signed)
I have reviewed and agree with note, evaluation, plan.  We will order A1c when we see him next  Tana Conch, MD

## 2023-05-22 NOTE — Progress Notes (Signed)
Pt reports eating some cranberries but this was over a week ago.  Pt already took warfarin today. Hold dose tomorrow and then change weekly dose to take 1 tablet daily except 1 1/2 tablets on Mondays. Recheck in 3 weeks at appointment with Dr. Durene Cal on 06/10/23.  Advised the coumadin clinic will contact him with dosing instructions when result is received. Pt verbalized understanding.  Sent msg to PCP requested POCT or lab INR.

## 2023-05-22 NOTE — Patient Instructions (Addendum)
Pre visit review using our clinic review tool, if applicable. No additional management support is needed unless otherwise documented below in the visit note.  Hold dose tomorrow and then change weekly dose to take 1 tablet daily except 1 1/2 tablets on Mondays. Recheck in 3 weeks at appointment with Dr. Durene Cal. Will call with dosing instructions.

## 2023-06-10 ENCOUNTER — Ambulatory Visit: Payer: Medicare HMO | Admitting: Family Medicine

## 2023-06-12 ENCOUNTER — Telehealth: Payer: Self-pay

## 2023-06-12 ENCOUNTER — Other Ambulatory Visit: Payer: Self-pay | Admitting: Family Medicine

## 2023-06-12 MED ORDER — FINASTERIDE 5 MG PO TABS: 5.0000 mg | ORAL_TABLET | Freq: Every day | ORAL | 3 refills | Status: DC

## 2023-06-12 NOTE — Telephone Encounter (Signed)
 Copied from CRM [id number]. Topic: Clinical - Medication Refill >> Jun 12, 2023 12:29 PM Macario HERO wrote: Most Recent Primary Care Visit:  Provider: ETHYL KIRSCH  Department: [facility]  Visit Type: COUMADIN  CLINIC  Date: 05/22/2023  Medication: finasteride  (PROSCAR ) 5 MG tablet [[id number]]  Has the patient contacted their pharmacy? Yes (Agent: If no, request that the patient contact the pharmacy for the refill. If patient does not wish to contact the pharmacy document the reason why and proceed with request.) (Agent: If yes, when and what did the pharmacy advise?)  Is this the correct pharmacy for this prescription? Yes If no, delete pharmacy and type the correct one.  This is the patient's preferred pharmacy:  [facility] - [place], [place] - [address] [place] [address] Phone: [phone] Fax: [phone]    Has the prescription been filled recently? Yes  Is the patient out of the medication? Yes  Has the patient been seen for an appointment in the last year OR does the patient have an upcoming appointment? Yes  Can we respond through MyChart? No  Agent: Please be advised that Rx refills may take up to 3 business days. We ask that you follow-up with your pharmacy.

## 2023-06-12 NOTE — Telephone Encounter (Signed)
 Rx refilled.  Copied from CRM [id number]. Topic: Clinical - Medication Refill >> Jun 12, 2023 12:29 PM Macario HERO wrote: Most Recent Primary Care Visit:  Provider: ETHYL KIRSCH  Department: [facility]  Visit Type: COUMADIN  CLINIC  Date: 05/22/2023  Medication: finasteride  (PROSCAR ) 5 MG tablet [[id number]]  Has the patient contacted their pharmacy? Yes (Agent: If no, request that the patient contact the pharmacy for the refill. If patient does not wish to contact the pharmacy document the reason why and proceed with request.) (Agent: If yes, when and what did the pharmacy advise?)  Is this the correct pharmacy for this prescription? Yes If no, delete pharmacy and type the correct one.  This is the patient's preferred pharmacy:  [facility] - [place], [place] - [address] [place] [address] Phone: [phone] Fax: [phone]    Has the prescription been filled recently? Yes  Is the patient out of the medication? Yes  Has the patient been seen for an appointment in the last year OR does the patient have an upcoming appointment? Yes  Can we respond through MyChart? No  Agent: Please be advised that Rx refills may take up to 3 business days. We ask that you follow-up with your pharmacy.

## 2023-06-14 ENCOUNTER — Encounter: Payer: Self-pay | Admitting: Family Medicine

## 2023-06-14 ENCOUNTER — Ambulatory Visit (INDEPENDENT_AMBULATORY_CARE_PROVIDER_SITE_OTHER): Payer: Medicare HMO | Admitting: Family Medicine

## 2023-06-14 VITALS — BP 110/60 | HR 87 | Temp 97.3°F | Ht 67.0 in | Wt 219.6 lb

## 2023-06-14 DIAGNOSIS — E1169 Type 2 diabetes mellitus with other specified complication: Secondary | ICD-10-CM

## 2023-06-14 DIAGNOSIS — Z7901 Long term (current) use of anticoagulants: Secondary | ICD-10-CM | POA: Diagnosis not present

## 2023-06-14 DIAGNOSIS — I700 Atherosclerosis of aorta: Secondary | ICD-10-CM | POA: Diagnosis not present

## 2023-06-14 DIAGNOSIS — E119 Type 2 diabetes mellitus without complications: Secondary | ICD-10-CM

## 2023-06-14 DIAGNOSIS — I10 Essential (primary) hypertension: Secondary | ICD-10-CM

## 2023-06-14 DIAGNOSIS — E785 Hyperlipidemia, unspecified: Secondary | ICD-10-CM | POA: Diagnosis not present

## 2023-06-14 DIAGNOSIS — I4821 Permanent atrial fibrillation: Secondary | ICD-10-CM | POA: Diagnosis not present

## 2023-06-14 LAB — PROTIME-INR
INR: 3.1 {ratio} — ABNORMAL HIGH (ref 0.8–1.0)
Prothrombin Time: 31.2 s — ABNORMAL HIGH (ref 9.6–13.1)

## 2023-06-14 LAB — CBC WITH DIFFERENTIAL/PLATELET
Basophils Absolute: 0.1 10*3/uL (ref 0.0–0.1)
Basophils Relative: 0.7 % (ref 0.0–3.0)
Eosinophils Absolute: 0.2 10*3/uL (ref 0.0–0.7)
Eosinophils Relative: 2.7 % (ref 0.0–5.0)
HCT: 42.2 % (ref 39.0–52.0)
Hemoglobin: 14.1 g/dL (ref 13.0–17.0)
Lymphocytes Relative: 26.9 % (ref 12.0–46.0)
Lymphs Abs: 2.3 10*3/uL (ref 0.7–4.0)
MCHC: 33.5 g/dL (ref 30.0–36.0)
MCV: 93.8 fL (ref 78.0–100.0)
Monocytes Absolute: 0.8 10*3/uL (ref 0.1–1.0)
Monocytes Relative: 9.6 % (ref 3.0–12.0)
Neutro Abs: 5.1 10*3/uL (ref 1.4–7.7)
Neutrophils Relative %: 60.1 % (ref 43.0–77.0)
Platelets: 169 10*3/uL (ref 150.0–400.0)
RBC: 4.5 Mil/uL (ref 4.22–5.81)
RDW: 13.5 % (ref 11.5–15.5)
WBC: 8.5 10*3/uL (ref 4.0–10.5)

## 2023-06-14 LAB — COMPREHENSIVE METABOLIC PANEL
ALT: 18 U/L (ref 0–53)
AST: 17 U/L (ref 0–37)
Albumin: 4.2 g/dL (ref 3.5–5.2)
Alkaline Phosphatase: 80 U/L (ref 39–117)
BUN: 15 mg/dL (ref 6–23)
CO2: 29 meq/L (ref 19–32)
Calcium: 9.2 mg/dL (ref 8.4–10.5)
Chloride: 106 meq/L (ref 96–112)
Creatinine, Ser: 1.18 mg/dL (ref 0.40–1.50)
GFR: 55.92 mL/min — ABNORMAL LOW (ref >60.00)
Glucose, Bld: 111 mg/dL — ABNORMAL HIGH (ref 70–99)
Potassium: 4.8 meq/L (ref 3.5–5.1)
Sodium: 139 meq/L (ref 135–145)
Total Bilirubin: 0.7 mg/dL (ref 0.2–1.2)
Total Protein: 7.3 g/dL (ref 6.0–8.3)

## 2023-06-14 LAB — HEMOGLOBIN A1C: Hgb A1c MFr Bld: 7.2 % — ABNORMAL HIGH (ref 4.6–6.5)

## 2023-06-14 NOTE — Patient Instructions (Addendum)
 Please stop by lab before you go If you have mychart- we will send your results within 3 business days of us  receiving them.  If you do not have mychart- we will call you about results within 5 business days of us  receiving them.  *please also note that you will see labs on mychart as soon as they post. I will later go in and write notes on them- will say notes from Dr. Katrinka   new diagnosis from a1c last visit of diabetes- we discussed healthy eating and regular exercise are very helpful- will refer to diabetes education for more info- he will call them if he doesn't hear within a week  Recommended follow up: Return in about 4 months (around 10/12/2023) for physical or sooner if needed.Schedule b4 you leave.

## 2023-06-14 NOTE — Progress Notes (Signed)
 Phone [phone] In person visit   Subjective:   Shawn [name] is a 89 y.o. year old very pleasant male patient who presents for/with See problem oriented charting Chief Complaint  Patient presents with   Medical Management of Chronic Issues   Hypertension   Hyperlipidemia   Leg Swelling    Pt c/o leg swelling between ankle swelling between legs and knees that has been going on x1 week.   Past Medical History-  Patient Active Problem List   Diagnosis Date Noted   Diabetes mellitus without complication (HCC) 06/14/2023    Priority: High   Complete heart block (HCC) 04/02/2019    Priority: High   CAD, NATIVE VESSEL 09/27/2008    Priority: High   PACEMAKER, PERMANENT 03/11/2007    Priority: High   Atrial fibrillation (HCC) 02/11/2007    Priority: High   Benign paroxysmal positional vertigo of right ear 09/24/2019    Priority: Medium    Status post total right knee replacement 08/11/2019    Priority: Medium    History of adenomatous polyp of colon 09/29/2014    Priority: Medium    BPH associated with nocturia 09/09/2014    Priority: Medium    Hyperlipidemia associated with type 2 diabetes mellitus (HCC) 03/10/2008    Priority: Medium    Essential hypertension 12/02/2006    Priority: Medium    Aortic atherosclerosis (HCC) 10/28/2018    Priority: Low   Long term (current) use of anticoagulants 02/18/2017    Priority: Low   Encounter for therapeutic drug monitoring 09/24/2013    Priority: Low   History of thrombocytopenia 06/17/2011    Priority: Low   Hx of skin cancer, basal cell 04/07/2008    Priority: Low   Actinic keratosis 04/07/2008    Priority: Low   Allergic rhinitis 12/02/2006    Priority: Low   GERD 12/02/2006    Priority: Low   Leg swelling 12/02/2022   Benign microscopic hematuria 10/02/2018    Medications- reviewed and updated Current Outpatient Medications  Medication Sig Dispense Refill   acetaminophen  (TYLENOL ) 325 MG tablet Take 325-650  mg by mouth every 6 (six) hours as needed (pain).     albuterol  (VENTOLIN  HFA) 108 (90 Base) MCG/ACT inhaler Inhale 2 puffs into the lungs every 6 (six) hours as needed for wheezing or shortness of breath (With acute illness. no asthma or COPD history). 1 each 0   atorvastatin  (LIPITOR) 40 MG tablet TAKE 1 TABLET EVERY DAY AT 6PM 90 tablet 3   brimonidine (ALPHAGAN) 0.2 % ophthalmic solution Place 1 drop into both eyes in the morning and at bedtime.     diltiazem  (CARDIZEM  CD) 240 MG 24 hr capsule TAKE 1 CAPSULE EVERY DAY 90 capsule 3   finasteride  (PROSCAR ) 5 MG tablet Take 1 tablet (5 mg total) by mouth daily. 90 tablet 3   fluticasone  (FLONASE ) 50 MCG/ACT nasal spray Place 2 sprays into both nostrils daily. 48 g 3   furosemide  (LASIX ) 20 MG tablet TAKE 1 TABLET ON TUES, FRI. TAKE 1 TAB AS NEEDED FOR WEIGHT GAIN OF 3 POUNDS OR MORE ON SAT, SUN, MON, AND WED AS DIRECTED 10 tablet 3   metoprolol  succinate (TOPROL -XL) 50 MG 24 hr tablet TAKE 1 AND 1/2 TABLETS EVERY DAY. TAKE WITH OR IMMEDIATELY FOLLOWING A MEAL. 135 tablet 3   Multiple Vitamins-Minerals (MULTIVITAMIN & MINERAL PO) Take 1 tablet by mouth every other day.      nitroGLYCERIN  (NITROSTAT ) 0.4 MG SL tablet Place 1 tablet (  0.4 mg total) under the tongue every 5 (five) minutes as needed for chest pain. 75 tablet 3   tamsulosin  (FLOMAX ) 0.4 MG CAPS capsule TAKE 2 CAPSULES EVERY DAY 180 capsule 3   warfarin (COUMADIN ) 2.5 MG tablet TAKE 2 TABLET BY MOUTH DAILY OR TAKE AS DIRECTED BY ANTICOAGULATION CLINIC (Patient taking differently: 2.5 mg. TAKE 2 TABLET BY MOUTH DAILY OR TAKE AS DIRECTED BY ANTICOAGULATION CLINIC) 200 tablet 3   No current facility-administered medications for this visit.     Objective:  BP 110/60   Pulse 87   Temp (!) 97.3 F (36.3 C)   Ht 5' 7 (1.702 m)   Wt 219 lb 9.6 oz (99.6 kg)   SpO2 98%   BMI 34.39 kg/m  Gen: NAD, resting comfortably CV: RRR no murmurs rubs or gallops Lungs: CTAB no crackles, wheeze,  rhonchi Ext: trace to 1+ edema- some scabing behind left lower leg- no surrounding erythema or tenderness Skin: warm, dry     Assessment and Plan    #skin cancer- removed from top of head- has hopefully final follow up next week- has already been treated x3  #venous stasis skin changes- normally wears compression but not wearing today so I can see an area that came up and scabbed over- around this does not appear warm or tender today- we discussed if this changes to let us  know- keep lubricated with Vaseline and use compression -also takes lasix  twice a week  # Diabetes-new diagnosis January 23, 2023 S: Medication:none  Lab Results  Component Value Date   HGBA1C 7.0 (H) 01/23/2023   A/P: new diagnosis from a1c last visit of diabetes- we discussed healthy eating and regular exercise are very helpful- will refer to diabetes education for more info- he will call them if he doesn't hear within a week  #CAD with history of stents #hyperlipidemia # Aortic atherosclerosis S: Medication: Atorvastatin  40Mg , Coumadin  (not on aspirin  as a result) -no chest pain or shortness of breath  Lab Results  Component Value Date   CHOL 94 01/23/2023   HDL 32.30 (L) 01/23/2023   LDLCALC 39 01/23/2023   LDLDIRECT 35.0 06/20/2022   TRIG 114.0 01/23/2023   CHOLHDL 3 01/23/2023   A/P: coronary artery disease asymptomatic continue current medications  Aortic atherosclerosis (presumed stable)- LDL goal ideally <70 - at goal- continue current medications   # Atrial fibrillation with pacemaker due to history of tachybradycardia and complete heart block syndrome-follows Dr. Waddell and Dr. Lavona S: Rate controlled with diltiazem  240 mg extended release, metoprolol  75 mg extended release Anticoagulated with Coumadin -follows with [facility] A/P: appropriately anticoagulated and rate controlled- continue current medicine    #hypertension S: medication: Metoprolol  75 mg extended release, diltiazem  240  mg extended release, also on Lasix  twice a week to help with edemathrough Dr. Lavona or us  BP Readings from Last 3 Encounters:  06/14/23 110/60  05/07/23 128/60  01/23/23 138/70  A/P: stable- continue current medicines   #BPH S: Medication: Finasteride  5 mg daily, tamsulosin  0.8 mg daily . Some incontinence- not needing pads A/P: overall stable- continue current medications    Recommended follow up: Return in about 4 months (around 10/12/2023) for physical or sooner if needed.Schedule b4 you leave. Future Appointments  Date Time Provider Department Center  07/23/2023  7:00 AM [facility] DEVICE REMOTES [facility]  10/22/2023  7:00 AM [facility] DEVICE REMOTES [facility]  11/18/2023 11:45 AM [facility] ANNUAL WELLNESS VISIT 1 [facility]  01/21/2024  7:00 AM [facility] DEVICE  REMOTES [facility]  04/21/2024  7:00 AM [facility] DEVICE REMOTES [facility]    Lab/Order associations:   ICD-10-CM   1. Diabetes mellitus without complication (HCC)  E11.9 Comprehensive metabolic panel    Hemoglobin A1c    CBC with Differential/Platelet    Ambulatory referral to diabetic education    2. Long term (current) use of anticoagulants  Z79.01 Protime-INR    3. Permanent atrial fibrillation (HCC) Chronic I48.21     4. Essential hypertension  I10     5. Hyperlipidemia associated with type 2 diabetes mellitus (HCC)  E11.69    E78.5     6. Aortic atherosclerosis (HCC)  I70.0       No orders of the defined types were placed in this encounter.   Return precautions advised.  Garnette Lukes, MD

## 2023-06-17 ENCOUNTER — Ambulatory Visit: Payer: Self-pay

## 2023-06-17 NOTE — Progress Notes (Signed)
 Pt had PCP apt and had lab draw INR. Pt reported he did not change Mondays to 1 1/2 tablets after last visit as advised. He said he kept taking 2 tablets on Mondays. Contacted pt by phone and advised of dosing and scheduled for next coumadin  clinic apt. Pt read back dosing instructions and verbalized understanding.  Due to pt not changing weekly dose after last advisement will try the prior dosing given at last apt.  Change weekly dose to take 1 tablet daily except take 1 1/2 tablets on Monday. Recheck in 2 weeks.

## 2023-06-17 NOTE — Patient Instructions (Addendum)
 Pre visit review using our clinic review tool, if applicable. No additional management support is needed unless otherwise documented below in the visit note.  Change weekly dose to take 1 tablet daily except take 1 1/2 tablets on Monday. Recheck in 2 weeks.

## 2023-06-18 ENCOUNTER — Other Ambulatory Visit: Payer: Self-pay

## 2023-06-18 MED ORDER — METFORMIN HCL 500 MG PO TABS: 500.0000 mg | ORAL_TABLET | Freq: Every day | ORAL | 3 refills | Status: DC

## 2023-06-19 DIAGNOSIS — L578 Other skin changes due to chronic exposure to nonionizing radiation: Secondary | ICD-10-CM | POA: Diagnosis not present

## 2023-06-19 DIAGNOSIS — S0100XA Unspecified open wound of scalp, initial encounter: Secondary | ICD-10-CM | POA: Diagnosis not present

## 2023-06-19 DIAGNOSIS — L570 Actinic keratosis: Secondary | ICD-10-CM | POA: Diagnosis not present

## 2023-07-03 ENCOUNTER — Ambulatory Visit (INDEPENDENT_AMBULATORY_CARE_PROVIDER_SITE_OTHER): Payer: Medicare HMO

## 2023-07-03 DIAGNOSIS — Z7901 Long term (current) use of anticoagulants: Secondary | ICD-10-CM

## 2023-07-03 LAB — POCT INR: INR: 2.8 (ref 2.0–3.0)

## 2023-07-03 NOTE — Progress Notes (Signed)
I have reviewed and agree with note, evaluation, plan.   Tana Conch, MD

## 2023-07-03 NOTE — Patient Instructions (Addendum)
Pre visit review using our clinic review tool, if applicable. No additional management support is needed unless otherwise documented below in the visit note.  Continue 1 tablet daily except take 1 1/2 tablets on Monday. Recheck in 6 weeks.

## 2023-07-03 NOTE — Progress Notes (Signed)
Continue 1 tablet daily except take 1 1/2 tablets on Monday. Recheck in 6 weeks.

## 2023-07-04 DIAGNOSIS — Z961 Presence of intraocular lens: Secondary | ICD-10-CM | POA: Diagnosis not present

## 2023-07-04 DIAGNOSIS — H35371 Puckering of macula, right eye: Secondary | ICD-10-CM | POA: Diagnosis not present

## 2023-07-04 DIAGNOSIS — H04123 Dry eye syndrome of bilateral lacrimal glands: Secondary | ICD-10-CM | POA: Diagnosis not present

## 2023-07-04 DIAGNOSIS — H353131 Nonexudative age-related macular degeneration, bilateral, early dry stage: Secondary | ICD-10-CM | POA: Diagnosis not present

## 2023-07-04 DIAGNOSIS — H4031X2 Glaucoma secondary to eye trauma, right eye, moderate stage: Secondary | ICD-10-CM | POA: Diagnosis not present

## 2023-07-23 ENCOUNTER — Ambulatory Visit (INDEPENDENT_AMBULATORY_CARE_PROVIDER_SITE_OTHER): Payer: Medicare HMO

## 2023-07-23 DIAGNOSIS — I442 Atrioventricular block, complete: Secondary | ICD-10-CM

## 2023-07-24 LAB — CUP PACEART REMOTE DEVICE CHECK
Battery Impedance: 4740 Ohm
Battery Remaining Longevity: 10 mo
Battery Voltage: 2.69 V
Brady Statistic RV Percent Paced: 98 %
Date Time Interrogation Session: 20250218081400
Implantable Lead Connection Status: 753985
Implantable Lead Connection Status: 753985
Implantable Lead Implant Date: 20081007
Implantable Lead Implant Date: 20081007
Implantable Lead Location: 753859
Implantable Lead Location: 753860
Implantable Lead Model: 5076
Implantable Lead Model: 5076
Implantable Pulse Generator Implant Date: 20160714
Lead Channel Impedance Value: 0 Ohm
Lead Channel Impedance Value: 476 Ohm
Lead Channel Pacing Threshold Amplitude: 0.625 V
Lead Channel Pacing Threshold Pulse Width: 0.4 ms
Lead Channel Setting Pacing Amplitude: 2 V
Lead Channel Setting Pacing Pulse Width: 0.46 ms
Lead Channel Setting Sensing Sensitivity: 1.4 mV
Zone Setting Status: 755011

## 2023-08-05 ENCOUNTER — Ambulatory Visit: Payer: Medicare HMO | Admitting: Dietician

## 2023-08-14 ENCOUNTER — Ambulatory Visit (INDEPENDENT_AMBULATORY_CARE_PROVIDER_SITE_OTHER): Payer: Medicare HMO

## 2023-08-14 DIAGNOSIS — Z7901 Long term (current) use of anticoagulants: Secondary | ICD-10-CM | POA: Diagnosis not present

## 2023-08-14 LAB — POCT INR: INR: 2.1 (ref 2.0–3.0)

## 2023-08-14 NOTE — Progress Notes (Signed)
 I have reviewed and agree with note, evaluation, plan.   Tana Conch, MD

## 2023-08-14 NOTE — Progress Notes (Signed)
 Continue 1 tablet daily except take 1 1/2 tablets on Monday. Recheck in 6 weeks.

## 2023-08-14 NOTE — Patient Instructions (Addendum)
 Pre visit review using our clinic review tool, if applicable. No additional management support is needed unless otherwise documented below in the visit note.  Continue 1 tablet daily except take 1 1/2 tablets on Monday. Recheck in 6 weeks.

## 2023-08-29 NOTE — Progress Notes (Signed)
 Remote pacemaker transmission.

## 2023-08-29 NOTE — Addendum Note (Signed)
 Addended by: Elease Etienne A on: 08/29/2023 12:53 PM   Modules accepted: Orders

## 2023-09-13 ENCOUNTER — Encounter: Payer: Medicare HMO | Attending: Family Medicine | Admitting: Dietician

## 2023-09-13 ENCOUNTER — Encounter: Payer: Self-pay | Admitting: Dietician

## 2023-09-13 VITALS — Ht 67.0 in | Wt 206.0 lb

## 2023-09-13 DIAGNOSIS — E119 Type 2 diabetes mellitus without complications: Secondary | ICD-10-CM | POA: Diagnosis not present

## 2023-09-13 NOTE — Progress Notes (Signed)
 907- 206 lbs   Diabetes Self-Management Education  Visit Type: First/Initial  Appt. Start Time: 0907 Appt. End Time: 1007  09/13/2023  [name], identified by name and date of birth, is a 89 y.o. male with a diagnosis of Diabetes: Type 2.   ASSESSMENT Patient is here today with his church friend.  Referral:  Type 2 Diabetes (newly diagnosed 06/2023), HTN, HLD, GERD, stent History:  Type 2 diabetes Labs noted to include:  A1C 7.2% 06/14/2023 increased from 7% on 01/23/2023, GFR 45 06/14/2023 Medications include:  Coumadin, Metformin, Lasix  67" 206 lbs 09/13/2023  Patient lives alone.  He cooks.  Eats out some.  Friend cooks.  Son brings food as well. 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 He mows his own yard (2 acres).   Height 5\' 7"  (1.702 m), weight 206 lb (93.4 kg). Body mass index is 32.26 kg/m.   Diabetes Self-Management Education - 09/13/23 0924       Visit Information   Visit Type First/Initial      Initial Visit   Diabetes Type Type 2    Date Diagnosed 01/2024    Are you currently following a meal plan? No    Are you taking your medications as prescribed? Yes      Health Coping   How would you rate your overall health? Excellent      Psychosocial Assessment   Patient Belief/Attitude about Diabetes Motivated to manage diabetes    Self-care barriers Hard of hearing    Self-management support Doctor's office    Other persons present Patient    Patient Concerns Nutrition/Meal planning    Special Needs None    Preferred Learning Style No preference indicated    Learning Readiness Ready    How often do you need to have someone help you when you read instructions, pamphlets, or other written materials from your doctor or pharmacy? 1 - Never    What is the last grade level you completed in school? 12      Pre-Education Assessment   Patient understands the diabetes disease and treatment process. Needs Instruction    Patient understands  incorporating nutritional management into lifestyle. Needs Instruction    Patient undertands incorporating physical activity into lifestyle. Needs Instruction    Patient understands using medications safely. Needs Instruction    Patient understands monitoring blood glucose, interpreting and using results Needs Instruction    Patient understands prevention, detection, and treatment of acute complications. Needs Instruction    Patient understands prevention, detection, and treatment of chronic complications. Needs Instruction    Patient understands how to develop strategies to address psychosocial issues. Needs Instruction    Patient understands how to develop strategies to promote health/change behavior. Needs Instruction      Complications   Last HgB A1C per patient/outside source 7.2 %   06/14/2023 increased from 7% 01/2023   How often do you check your blood sugar? 0 times/day (not testing)    Have you had a dilated eye exam in the past 12 months? Yes    Have you had a dental exam in the past 12 months? Yes    Are you checking your feet? Yes    How many days per week are you checking your feet? 7      Dietary Intake   Breakfast banana, small powdered donut today but usually 2 eggs, bacon or sausage, juice , coffee with sweetened creamer    Snack (morning) occasional 4 PB crackers  Lunch spaghetti, salad, bread    Snack (afternoon) grapes    Dinner spaghetti, salad, bread    Snack (evening) occasional orange, peanuts, or popcorn    Beverage(s) water, coffee with sweetened creamer, diet soda, OJ, sweet tea      Activity / Exercise   Activity / Exercise Type ADL's   He does his own housekeeping.  He mows his own yard.     Patient Education   Previous Diabetes Education No    Disease Pathophysiology Definition of diabetes, type 1 and 2, and the diagnosis of diabetes    Healthy Eating Food label reading, portion sizes and measuring food.;Plate Method;Role of diet in the treatment of  diabetes and the relationship between the three main macronutrients and blood glucose level;Meal options for control of blood glucose level and chronic complications.;Meal timing in regards to the patients' current diabetes medication.    Being Active Role of exercise on diabetes management, blood pressure control and cardiac health.;Helped patient identify appropriate exercises in relation to his/her diabetes, diabetes complications and other health issue.    Medications Reviewed patients medication for diabetes, action, purpose, timing of dose and side effects.    Monitoring Identified appropriate SMBG and/or A1C goals.;Daily foot exams;Yearly dilated eye exam    Acute complications Taught prevention, symptoms, and  treatment of hypoglycemia - the 15 rule.;Discussed and identified patients' prevention, symptoms, and treatment of hyperglycemia.    Chronic complications Relationship between chronic complications and blood glucose control    Diabetes Stress and Support Identified and addressed patients feelings and concerns about diabetes;Worked with patient to identify barriers to care and solutions;Role of stress on diabetes      Individualized Goals (developed by patient)   Nutrition General guidelines for healthy choices and portions discussed    Physical Activity Exercise 5-7 days per week;30 minutes per day    Medications take my medication as prescribed    Problem Solving Eating Pattern    Reducing Risk do foot checks daily      Post-Education Assessment   Patient understands the diabetes disease and treatment process. Comprehends key points    Patient understands incorporating nutritional management into lifestyle. Comprehends key points    Patient undertands incorporating physical activity into lifestyle. Comprehends key points    Patient understands using medications safely. Comphrehends key points    Patient understands monitoring blood glucose, interpreting and using results  Comprehends key points    Patient understands prevention, detection, and treatment of acute complications. Comprehends key points    Patient understands prevention, detection, and treatment of chronic complications. Comprehends key points    Patient understands how to develop strategies to address psychosocial issues. Comprehends key points    Patient understands how to develop strategies to promote health/change behavior. Comprehends key points      Outcomes   Expected Outcomes Demonstrated interest in learning. Expect positive outcomes    Future DMSE PRN    Program Status Completed             Individualized Plan for Diabetes Self-Management Training:   Learning Objective:  Patient will have a greater understanding of diabetes self-management. Patient education plan is to attend individual and/or group sessions per assessed needs and concerns.   Plan:   Patient Instructions  Plan:  Aim for 3 Carb Choices per meal (45 grams) +/- 1 either way  Aim for 0-1 Carbs per snack if hungry  Include protein in moderation with your meals and snacks Consider reading food labels for  Total Carbohydrate of foods Consider  increasing your activity level by walking or yard work for 30 minutes daily as tolerated Consider taking medication as directed by MD  Eat more Non-Starchy Vegetables These include greens, broccoli, cauliflower, cabbage, carrots, beets, eggplant, peppers, squash and others. Minimize added sugars and refined grains Rethink what you drink.  Choose beverages without added sugar.  Look for 0 carbs on the label. See the list of whole grains below.  Find alternatives to usual sweet treats. Choose whole foods over processed. Make simple meals at home more often than eating out.  Tips to increase fiber in your diet: (All plants have fiber.  Eat a variety. There are more than are on this list.) Slowly increase the amount of fiber you eat to 25-35 grams per day.  (More is fine if  you tolerate it.) Fiber from whole grains, nuts and seeds Quinoa, 1/2 cup = 5 grams Bulgur, 1/2 cup = 4.1 grams Popcorn, 3 cups = 3.6 grams Whole Wheat Spaghetti, 1/2 cup = 3.2 grams Barley, 1/2 cup = 3 grams Oatmeal, 1/2 cup = 2 grams Whole Wheat English Muffin = 3 grams Corn, 1/2 cup = 2.1 grams Brown Rice, 1/2 cup = 1.8 grams Flax seeds, 1 Tablespoon = 2.8 grams Chia seeds, 1 Tablespoon = 11 grams Almonds, 1 ounce = 3.5 grams fiber Fiber from legumes Kidney beans, 1/2 cup 7.9 grams Lentils, 1/2 cup = 7.8 grams Pinto beans, 1/2 cup = 7.7 grams Black beans, 1/2 cup = 7.6 grams Lima beans, 1/2 cup 6.4 grams Chick peas, 1/2 cup = 5.3 grams Black eyed peas, 1/2 cup = 4 grams Fiber from fruits and vegetables Pear, 6 grams Apple. 3.3 grams Raspberries or Blackberries, 3/4 cup = 6 grams Strawberries or Blueberries, 1 cup = 3.4 grams Baked sweet potato 3.8 grams fiber Baked potato with skin 4.4 grams  Peas, 1/2 cup = 4.4 grams  Spinach, 1/2 cup cooked = 3.5 grams  Avocado, 1/2 = 5 grams  Expected Outcomes:  Demonstrated interest in learning. Expect positive outcomes  Education material provided: ADA - How to Thrive: A Guide for Your Journey with Diabetes, Food label handouts, Meal plan card, Snack sheet, and Diabetes Resources, Vitamin K and medications  If problems or questions, patient to contact team via:  Phone  Future DSME appointment: PRN

## 2023-09-13 NOTE — Patient Instructions (Addendum)
 Plan:  Aim for 3 Carb Choices per meal (45 grams) +/- 1 either way  Aim for 0-1 Carbs per snack if hungry  Include protein in moderation with your meals and snacks Consider reading food labels for Total Carbohydrate of foods Consider  increasing your activity level by walking or yard work for 30 minutes daily as tolerated Consider taking medication as directed by MD  Eat more Non-Starchy Vegetables These include greens, broccoli, cauliflower, cabbage, carrots, beets, eggplant, peppers, squash and others. Minimize added sugars and refined grains Rethink what you drink.  Choose beverages without added sugar.  Look for 0 carbs on the label. See the list of whole grains below.  Find alternatives to usual sweet treats. Choose whole foods over processed. Make simple meals at home more often than eating out.  Tips to increase fiber in your diet: (All plants have fiber.  Eat a variety. There are more than are on this list.) Slowly increase the amount of fiber you eat to 25-35 grams per day.  (More is fine if you tolerate it.) Fiber from whole grains, nuts and seeds Quinoa, 1/2 cup = 5 grams Bulgur, 1/2 cup = 4.1 grams Popcorn, 3 cups = 3.6 grams Whole Wheat Spaghetti, 1/2 cup = 3.2 grams Barley, 1/2 cup = 3 grams Oatmeal, 1/2 cup = 2 grams Whole Wheat English Muffin = 3 grams Corn, 1/2 cup = 2.1 grams Brown Rice, 1/2 cup = 1.8 grams Flax seeds, 1 Tablespoon = 2.8 grams Chia seeds, 1 Tablespoon = 11 grams Almonds, 1 ounce = 3.5 grams fiber Fiber from legumes Kidney beans, 1/2 cup 7.9 grams Lentils, 1/2 cup = 7.8 grams Pinto beans, 1/2 cup = 7.7 grams Black beans, 1/2 cup = 7.6 grams Lima beans, 1/2 cup 6.4 grams Chick peas, 1/2 cup = 5.3 grams Black eyed peas, 1/2 cup = 4 grams Fiber from fruits and vegetables Pear, 6 grams Apple. 3.3 grams Raspberries or Blackberries, 3/4 cup = 6 grams Strawberries or Blueberries, 1 cup = 3.4 grams Baked sweet potato 3.8 grams fiber Baked potato  with skin 4.4 grams  Peas, 1/2 cup = 4.4 grams  Spinach, 1/2 cup cooked = 3.5 grams  Avocado, 1/2 = 5 grams

## 2023-09-19 ENCOUNTER — Other Ambulatory Visit: Payer: Self-pay | Admitting: Family Medicine

## 2023-09-19 DIAGNOSIS — E785 Hyperlipidemia, unspecified: Secondary | ICD-10-CM

## 2023-09-25 ENCOUNTER — Ambulatory Visit

## 2023-09-25 DIAGNOSIS — Z7901 Long term (current) use of anticoagulants: Secondary | ICD-10-CM

## 2023-09-25 LAB — POCT INR: INR: 2.5 (ref 2.0–3.0)

## 2023-09-25 NOTE — Patient Instructions (Addendum)
 Pre visit review using our clinic review tool, if applicable. No additional management support is needed unless otherwise documented below in the visit note.  Continue 1 tablet daily except take 1 1/2 tablets on Monday. Recheck in 6 weeks.

## 2023-09-25 NOTE — Progress Notes (Signed)
 Continue 1 tablet daily except take 1 1/2 tablets on Monday. Recheck in 6 weeks.

## 2023-09-25 NOTE — Progress Notes (Signed)
 I have reviewed and agree with note, evaluation, plan.   Tana Conch, MD

## 2023-10-09 ENCOUNTER — Other Ambulatory Visit: Payer: Self-pay

## 2023-10-09 MED ORDER — FUROSEMIDE 20 MG PO TABS: ORAL_TABLET | ORAL | 3 refills | Status: DC

## 2023-10-22 ENCOUNTER — Ambulatory Visit: Payer: Medicare HMO

## 2023-10-23 ENCOUNTER — Telehealth: Payer: Self-pay

## 2023-10-23 ENCOUNTER — Ambulatory Visit: Payer: Self-pay | Admitting: Internal Medicine

## 2023-10-23 LAB — CUP PACEART REMOTE DEVICE CHECK
Battery Impedance: 5178 Ohm
Battery Remaining Longevity: 8 mo
Battery Voltage: 2.68 V
Brady Statistic RV Percent Paced: 98 %
Date Time Interrogation Session: 20250520070705
Implantable Lead Connection Status: 753985
Implantable Lead Connection Status: 753985
Implantable Lead Implant Date: 20081007
Implantable Lead Implant Date: 20081007
Implantable Lead Location: 753859
Implantable Lead Location: 753860
Implantable Lead Model: 5076
Implantable Lead Model: 5076
Implantable Pulse Generator Implant Date: 20160714
Lead Channel Impedance Value: 0 Ohm
Lead Channel Impedance Value: 471 Ohm
Lead Channel Pacing Threshold Amplitude: 0.625 V
Lead Channel Pacing Threshold Pulse Width: 0.4 ms
Lead Channel Setting Pacing Amplitude: 2 V
Lead Channel Setting Pacing Pulse Width: 0.4 ms
Lead Channel Setting Sensing Sensitivity: 1.4 mV
Zone Setting Status: 755011

## 2023-10-23 NOTE — Telephone Encounter (Signed)
 Monthly battery checks added. Patient will need to be called and advised of dates as monitor is not automatic.

## 2023-10-23 NOTE — Telephone Encounter (Signed)
 PPM Scheduled remote reviewed. Normal device function.  Presenting rhythm: VP Battery at <1-18 months with VP 98%.  Will start IFU per protocol.

## 2023-10-24 NOTE — Telephone Encounter (Signed)
 Attempted to contact patient. No answer, left message to call back.   Can someone f/u to make sure pt gets dates of when to send transmissions for monthly battery checks.  Thanks, Cleave Curling

## 2023-11-01 DIAGNOSIS — Z961 Presence of intraocular lens: Secondary | ICD-10-CM | POA: Diagnosis not present

## 2023-11-01 DIAGNOSIS — H4031X2 Glaucoma secondary to eye trauma, right eye, moderate stage: Secondary | ICD-10-CM | POA: Diagnosis not present

## 2023-11-01 DIAGNOSIS — H04123 Dry eye syndrome of bilateral lacrimal glands: Secondary | ICD-10-CM | POA: Diagnosis not present

## 2023-11-04 ENCOUNTER — Ambulatory Visit: Payer: Self-pay | Admitting: *Deleted

## 2023-11-04 ENCOUNTER — Telehealth: Payer: Self-pay | Admitting: Internal Medicine

## 2023-11-04 NOTE — Telephone Encounter (Signed)
  1. Has your device fired? no  2. Is you device beeping? no  3. Are you experiencing draining or swelling at device site? no  4. Are you calling to see if we received your device transmission? Did we receive 5/20 transmission?  5. Have you passed out? no   Please route to Device Clinic Pool

## 2023-11-04 NOTE — Telephone Encounter (Signed)
 Notified patient we did receive a transmission for patient on 10/22/23 (see separate phone note).  I discussed transmission with patient and explained his Adapta is nearing ERI.  I gave him the monthly battery remote transmission below so that he will know when to send.   Patient states he has no further questions.

## 2023-11-04 NOTE — Telephone Encounter (Signed)
 Copied from CRM [id number]. Topic: Clinical - Red Word Triage >> Nov 04, 2023  9:23 AM Dorthula Gavel H wrote: Red Word that prompted transfer to Nurse Triage: Pt states he is having pain in his left side that has been going on since last Thursday that is about a pain level of 6/7. Reason for Disposition  Age > 60 years  Answer Assessment - Initial Assessment Questions 1. LOCATION: "Where does it hurt?"      I'm having pain in my left side that started last Thur.   It's intermittent.  A Tylenol  helps.   2. RADIATION: "Does the pain shoot anywhere else?" (e.g., chest, back)     He goes into my back  and up under my left arm.   3. ONSET: "When did the pain begin?" (Minutes, hours or days ago)      Last Thursday. I have a pacemaker.   I had it checked 2 weeks ago.   They have not let me know how that turned out.  I need to have my blood checked on Wed.   I would like to see Dr. Arlene Ben.   I'm not short of breath.    I had diarrhea yesterday a couple of times.   I think it was what I ate 4. SUDDEN: "Gradual or sudden onset?"     Not asked 5. PATTERN "Does the pain come and go, or is it constant?"    - If it comes and goes: "How long does it last?" "Do you have pain now?"     (Note: Comes and goes means the pain is intermittent. It goes away completely between bouts.)    - If constant: "Is it getting better, staying the same, or getting worse?"      (Note: Constant means the pain never goes away completely; most serious pain is constant and gets worse.)      Intermittently  6. SEVERITY: "How bad is the pain?"  (e.g., Scale 1-10; mild, moderate, or severe)    - MILD (1-3): Doesn't interfere with normal activities, abdomen soft and not tender to touch.     - MODERATE (4-7): Interferes with normal activities or awakens from sleep, abdomen tender to touch.     - SEVERE (8-10): Excruciating pain, doubled over, unable to do any normal activities.       7/10 pain scale 7. RECURRENT SYMPTOM: "Have you ever had this  type of stomach pain before?" If Yes, ask: "When was the last time?" and "What happened that time?"      I've had "hurting" there before.   Dr. Arlene Ben checked it.   8. CAUSE: "What do you think is causing the stomach pain?"     I don't know   9. RELIEVING/AGGRAVATING FACTORS: "What makes it better or worse?" (e.g., antacids, bending or twisting motion, bowel movement)     Nothing 10. OTHER SYMPTOMS: "Do you have any other symptoms?" (e.g., back pain, diarrhea, fever, urination pain, vomiting)       Diarrhea a couple times yesterday.  Protocols used: Abdominal Pain - Male-A-AH  Chief Complaint: Left sided abd pain Symptoms: diarrhea a couple of times 11/03/2023 Frequency: Pain started last Thur. Pertinent Negatives: Patient denies vomiting or fever Disposition: [] ED /[] Urgent Care (no appt availability in office) / [x] Appointment(In office/virtual)/ []  [facility]/ [] Home Care/ [] Refused Recommended Disposition /[] [facility]/ []  Follow-up with PCP Additional Notes: Appt made with Dr. Arlene Ben for 11/05/2023

## 2023-11-05 ENCOUNTER — Encounter: Payer: Self-pay | Admitting: Family Medicine

## 2023-11-05 ENCOUNTER — Ambulatory Visit (INDEPENDENT_AMBULATORY_CARE_PROVIDER_SITE_OTHER): Admitting: Family Medicine

## 2023-11-05 VITALS — BP 124/60 | HR 72 | Temp 97.8°F | Ht 67.0 in | Wt 203.0 lb

## 2023-11-05 DIAGNOSIS — T148XXA Other injury of unspecified body region, initial encounter: Secondary | ICD-10-CM

## 2023-11-05 DIAGNOSIS — M546 Pain in thoracic spine: Secondary | ICD-10-CM

## 2023-11-05 DIAGNOSIS — I10 Essential (primary) hypertension: Secondary | ICD-10-CM | POA: Diagnosis not present

## 2023-11-05 NOTE — Patient Instructions (Addendum)
 Try heating peripheral arterial disease 15 minutes 3-4 x a day- continue tylenol . I think this is a musculature strain -will plan on bloodwork on 24th -also if not improving may consider physical therapy - I want you to avoid heavy lifting or anything that you feel a stain interest his area until I see you back  Recommended follow up: Return for next already scheduled visit or sooner if needed.

## 2023-11-05 NOTE — Progress Notes (Signed)
 Phone [phone] In person visit   Subjective:   Shawn [name] is a 89 y.o. year old very pleasant male patient who presents for/with See problem oriented charting Chief Complaint  Patient presents with   Flank Pain    L side; started Thursday around left shoulder and back, thought it was getting better but now has radiated onto L side; some sharp pains but mostly dull aching; feel it a lot when lifting left arm; fine when not moving around    Past Medical History-  Patient Active Problem List   Diagnosis Date Noted   Diabetes mellitus without complication (HCC) 06/14/2023    Priority: High   Complete heart block (HCC) 04/02/2019    Priority: High   CAD, NATIVE VESSEL 09/27/2008    Priority: High   PACEMAKER, PERMANENT 03/11/2007    Priority: High   Atrial fibrillation (HCC) 02/11/2007    Priority: High   Benign paroxysmal positional vertigo of right ear 09/24/2019    Priority: Medium    Status post total right knee replacement 08/11/2019    Priority: Medium    History of adenomatous polyp of colon 09/29/2014    Priority: Medium    BPH associated with nocturia 09/09/2014    Priority: Medium    Hyperlipidemia associated with type 2 diabetes mellitus (HCC) 03/10/2008    Priority: Medium    Essential hypertension 12/02/2006    Priority: Medium    Aortic atherosclerosis (HCC) 10/28/2018    Priority: Low   Long term (current) use of anticoagulants 02/18/2017    Priority: Low   Encounter for therapeutic drug monitoring 09/24/2013    Priority: Low   History of thrombocytopenia 06/17/2011    Priority: Low   Hx of skin cancer, basal cell 04/07/2008    Priority: Low   Actinic keratosis 04/07/2008    Priority: Low   Allergic rhinitis 12/02/2006    Priority: Low   GERD 12/02/2006    Priority: Low   Leg swelling 12/02/2022   Benign microscopic hematuria 10/02/2018    Medications- reviewed and updated Current Outpatient Medications  Medication Sig Dispense Refill    acetaminophen  (TYLENOL ) 325 MG tablet Take 325-650 mg by mouth every 6 (six) hours as needed (pain).     albuterol  (VENTOLIN  HFA) 108 (90 Base) MCG/ACT inhaler Inhale 2 puffs into the lungs every 6 (six) hours as needed for wheezing or shortness of breath (With acute illness. no asthma or COPD history). 1 each 0   atorvastatin  (LIPITOR) 40 MG tablet TAKE 1 TABLET EVERY DAY AT 6PM 90 tablet 3   brimonidine (ALPHAGAN) 0.2 % ophthalmic solution Place 1 drop into both eyes in the morning and at bedtime.     diltiazem  (CARDIZEM  CD) 240 MG 24 hr capsule TAKE 1 CAPSULE EVERY DAY 90 capsule 3   finasteride  (PROSCAR ) 5 MG tablet Take 1 tablet (5 mg total) by mouth daily. 90 tablet 3   fluticasone  (FLONASE ) 50 MCG/ACT nasal spray Place 2 sprays into both nostrils daily. 48 g 3   furosemide  (LASIX ) 20 MG tablet TAKE 1 TABLET ON TUES, FRI. TAKE 1 TAB AS NEEDED FOR WEIGHT GAIN OF 3 POUNDS OR MORE ON SAT, SUN, MON, AND WED AS DIRECTED 30 tablet 3   metFORMIN  (GLUCOPHAGE ) 500 MG tablet Take 1 tablet (500 mg total) by mouth daily with breakfast. 90 tablet 3   metoprolol  succinate (TOPROL -XL) 50 MG 24 hr tablet TAKE 1 AND 1/2 TABLETS EVERY DAY. TAKE WITH OR IMMEDIATELY FOLLOWING A MEAL. 135  tablet 3   Multiple Vitamins-Minerals (MULTIVITAMIN & MINERAL PO) Take 1 tablet by mouth every other day.      nitroGLYCERIN  (NITROSTAT ) 0.4 MG SL tablet Place 1 tablet (0.4 mg total) under the tongue every 5 (five) minutes as needed for chest pain. 75 tablet 3   tamsulosin  (FLOMAX ) 0.4 MG CAPS capsule TAKE 2 CAPSULES EVERY DAY 180 capsule 3   warfarin (COUMADIN ) 2.5 MG tablet TAKE 2 TABLET BY MOUTH DAILY OR TAKE AS DIRECTED BY ANTICOAGULATION CLINIC (Patient taking differently: 2.5 mg. TAKE 2 TABLET BY MOUTH DAILY OR TAKE AS DIRECTED BY ANTICOAGULATION CLINIC) 200 tablet 3   No current facility-administered medications for this visit.     Objective:  BP 124/60   Pulse 72   Temp 97.8 F (36.6 C)   Ht 5\' 7"  (1.702 m)    Wt 203 lb (92.1 kg)   SpO2 95%   BMI 31.79 kg/m  Gen: NAD, resting comfortably CV: RRR no murmurs rubs or gallops Lungs: CTAB no crackles, wheeze, rhonchi Abdomen: soft/nontender including in left lower quadrant/nondistended/normal bowel sounds. No rebound or guarding.  No midline back pain.  Some pain with palpation over left thoracic back just below the ribs.  No CVA tenderness.  Good range of motion of the back Ext: 1+ edema and venous stasis changes noted including some dry skin-discussed using Vaseline and could use compression Skin: warm, dry     Assessment and Plan   # Left lower quadrant pain, left thoracic back pain S: Patient started with left-sided lower abdominal pain last Tuesday about 10 days ago. Came home on Thursday and got better at first. Then radiated into left low back and up all the way to his scapula. Then noted pain getting better again then on Sunday through yesterday continued to ease off until mowing grass yesterday on tractor- was doing ok while mowing but when got done realized pain was worse. Today slightly better. Movement seems to bother it- particularly left arm. Rest seems to hurt. No recurrence of pain abdomen. No nausea or vomiting. No fever or chills. No chest pain or shortness of breath  -pain only 3/10 today. Tylenol  helpful  -looking back to trip did carry heavy luggage - has not tried heating peripheral arterial disease -no dysuria or polyuria A/P: Patient last week started with left lower quadrant pain that then resided and then started with left thoracic pain a few days later that was improving until he worked on his lawn-arm movement seems to agitate the area-appears to have musculoskeletal strain in left thoracic-recommended heating pad and Tylenol  and follow-up on June 24 visit-she preferred to keep that visit for follow-up instead of doing labs and evaluation for his other chronic conditions today.  Offered blood work and urine but he prefers to  wait until next visit and only do this for routine measures  #hypertension S: medication: Metoprolol  75 mg extended release, diltiazem  240 mg extended release, also on Lasix  twice a week to help with edemathrough Dr. Lavonne Prairie A/P: Blood pressure very well-controlled-continue current medication  Recommended follow up: Return for next already scheduled visit or sooner if needed. Future Appointments  Date Time Provider Department Center  11/06/2023 10:30 AM [facility] COUMADIN  CLINIC [facility]  11/20/2023 11:20 AM [facility] ANNUAL WELLNESS VISIT 1 [facility]  11/25/2023  7:20 AM [facility] DEVICE REMOTES [facility]  11/26/2023  3:00 PM Almira Jaeger, MD [facility]  12/26/2023  7:20 AM [facility] DEVICE REMOTES [facility]  01/27/2024  7:20 AM [facility] DEVICE REMOTES [facility]  02/27/2024  7:20 AM [facility] DEVICE REMOTES [facility]  03/30/2024  7:20 AM [facility] DEVICE REMOTES [facility]  04/30/2024  7:20 AM [facility] DEVICE REMOTES [facility]  06/01/2024  7:20 AM [facility] DEVICE REMOTES [facility]  07/02/2024  7:20 AM [facility] DEVICE REMOTES [facility]  08/03/2024  7:20 AM [facility] DEVICE REMOTES [facility]  09/03/2024  7:20 AM [facility] DEVICE REMOTES [facility]    Lab/Order associations:   ICD-10-CM   1. Musculoskeletal strain  T14.8XXA     2. Acute left-sided thoracic back pain  M54.6     3. Essential hypertension  I10       No orders of the defined types were placed in this encounter.   Return precautions advised.  Clarisa Crooked, MD

## 2023-11-06 ENCOUNTER — Ambulatory Visit (INDEPENDENT_AMBULATORY_CARE_PROVIDER_SITE_OTHER)

## 2023-11-06 DIAGNOSIS — Z7901 Long term (current) use of anticoagulants: Secondary | ICD-10-CM

## 2023-11-06 LAB — POCT INR: INR: 2.3 (ref 2.0–3.0)

## 2023-11-06 NOTE — Patient Instructions (Addendum)
 Pre visit review using our clinic review tool, if applicable. No additional management support is needed unless otherwise documented below in the visit note.  Continue 1 tablet daily except take 1 1/2 tablets on Monday. Recheck in 6 weeks.

## 2023-11-06 NOTE — Progress Notes (Signed)
 I have reviewed and agree with note, evaluation, plan.   Tana Conch, MD

## 2023-11-06 NOTE — Progress Notes (Signed)
 Continue 1 tablet daily except take 1 1/2 tablets on Monday. Recheck in 6 weeks.

## 2023-11-19 ENCOUNTER — Other Ambulatory Visit: Payer: Self-pay

## 2023-11-19 DIAGNOSIS — Z7901 Long term (current) use of anticoagulants: Secondary | ICD-10-CM

## 2023-11-19 MED ORDER — WARFARIN SODIUM 2.5 MG PO TABS: ORAL_TABLET | ORAL | 1 refills | Status: DC

## 2023-11-19 NOTE — Progress Notes (Signed)
 Pt is compliant with warfarin management and PCP apts.  Sent in refill of warfarin to requested pharmacy.

## 2023-11-20 ENCOUNTER — Ambulatory Visit (INDEPENDENT_AMBULATORY_CARE_PROVIDER_SITE_OTHER)

## 2023-11-20 VITALS — Ht 67.0 in | Wt 203.0 lb

## 2023-11-20 DIAGNOSIS — Z0000 Encounter for general adult medical examination without abnormal findings: Secondary | ICD-10-CM | POA: Diagnosis not present

## 2023-11-20 NOTE — Patient Instructions (Signed)
 [name] , Thank you for taking time out of your busy schedule to complete your Annual Wellness Visit with me. I enjoyed our conversation and look forward to speaking with you again next year. I, as well as your care team,  appreciate your ongoing commitment to your health goals. Please review the following plan we discussed and let me know if I can assist you in the future. Your Game plan/ To Do List    Referrals: If you haven't heard from the office you've been referred to, please reach out to them at the phone provided.   Follow up Visits: Next Medicare AWV with our clinical staff: 11/24/24   Have you seen your provider in the last 6 months (3 months if uncontrolled diabetes)? Yes Next Office Visit with your provider: 11/26/23  Clinician Recommendations:  Each day, aim for 6 glasses of water , plenty of protein in your diet and try to get up and walk/ stretch every hour for 5-10 minutes at a time.        This is a list of the screening recommended for you and due dates:  Health Maintenance  Topic Date Due   Complete foot exam   Never done   Eye exam for diabetics  Never done   COVID-19 Vaccine (5 - 2024-25 season) 02/03/2023   Medicare Annual Wellness Visit  11/12/2023   Hemoglobin A1C  12/12/2023   Flu Shot  01/03/2024   Pneumococcal Vaccine for age over 29  Completed   Zoster (Shingles) Vaccine  Completed   HPV Vaccine  Aged Out   Meningitis B Vaccine  Aged Out   DTaP/Tdap/Td vaccine  Discontinued   Colon Cancer Screening  Discontinued    Advanced directives: (Copy Requested) Please bring a copy of your health care power of attorney and living will to the office to be added to your chart at your convenience. You can mail to [facility] [address] [place], [place] [address] or email to [email or web address] Advance Care Planning is important because it:  [x]  Makes sure you receive the medical care that is consistent with your values, goals, and  preferences  [x]  It provides guidance to your family and loved ones and reduces their decisional burden about whether or not they are making the right decisions based on your wishes.  Follow the link provided in your after visit summary or read over the paperwork we have mailed to you to help you started getting your Advance Directives in place. If you need assistance in completing these, please reach out to us  so that we can help you!  See attachments for Preventive Care and Fall Prevention Tips.

## 2023-11-20 NOTE — Progress Notes (Addendum)
 Subjective:   Shawn [name] is a 89 y.o. who presents for a Medicare Wellness preventive visit.  As a reminder, Annual Wellness Visits don't include a physical exam, and some assessments may be limited, especially if this visit is performed virtually. We may recommend an in-person follow-up visit with your provider if needed.  Visit Complete: Virtual I connected with  Shawn [name] on 11/20/23 by a audio enabled telemedicine application and verified that I am speaking with the correct person using two identifiers.  Patient Location: Home  Provider Location: Office/Clinic  I discussed the limitations of evaluation and management by telemedicine. The patient expressed understanding and agreed to proceed.  Vital Signs: Because this visit was a virtual/telehealth visit, some criteria may be missing or patient reported. Any vitals not documented were not able to be obtained and vitals that have been documented are patient reported.  VideoDeclined- This patient declined Librarian, academic. Therefore the visit was completed with audio only.  Persons Participating in Visit: Patient.  AWV Questionnaire: No: Patient Medicare AWV questionnaire was not completed prior to this visit.  Cardiac Risk Factors include: advanced age (>10men, >15 women);diabetes mellitus;dyslipidemia;male gender;hypertension;obesity (BMI >30kg/m2)     Objective:    Today's Vitals   11/20/23 1115  Weight: 203 lb (92.1 kg)  Height: 5' 7 (1.702 m)   Body mass index is 31.79 kg/m.     11/20/2023   11:19 AM 09/13/2023    9:17 AM 11/12/2022    1:10 PM 11/24/2021    9:00 AM 02/02/2021    9:46 AM 10/07/2020    3:25 PM 08/11/2019    8:22 AM  Advanced Directives  Does Patient Have a Medical Advance Directive? Yes Yes Yes Yes Yes Yes Yes  Type of Estate agent of [place];Living will  Healthcare Power of [place];Living will Healthcare Power of [place];Living will   Healthcare Power of [employer] of [place];Living will  Does patient want to make changes to medical advance directive?       No - Patient declined  Copy of Healthcare Power of Attorney in Chart? No - copy requested  No - copy requested No - copy requested  No - copy requested No - copy requested    Current Medications (verified) Outpatient Encounter Medications as of 11/20/2023  Medication Sig   acetaminophen  (TYLENOL ) 325 MG tablet Take 325-650 mg by mouth every 6 (six) hours as needed (pain).   albuterol  (VENTOLIN  HFA) 108 (90 Base) MCG/ACT inhaler Inhale 2 puffs into the lungs every 6 (six) hours as needed for wheezing or shortness of breath (With acute illness. no asthma or COPD history).   atorvastatin  (LIPITOR) 40 MG tablet TAKE 1 TABLET EVERY DAY AT 6PM   brimonidine (ALPHAGAN) 0.2 % ophthalmic solution Place 1 drop into both eyes in the morning and at bedtime.   diltiazem  (CARDIZEM  CD) 240 MG 24 hr capsule TAKE 1 CAPSULE EVERY DAY   finasteride  (PROSCAR ) 5 MG tablet Take 1 tablet (5 mg total) by mouth daily.   fluticasone  (FLONASE ) 50 MCG/ACT nasal spray Place 2 sprays into both nostrils daily.   furosemide  (LASIX ) 20 MG tablet TAKE 1 TABLET ON TUES, FRI. TAKE 1 TAB AS NEEDED FOR WEIGHT GAIN OF 3 POUNDS OR MORE ON SAT, SUN, MON, AND WED AS DIRECTED   metFORMIN  (GLUCOPHAGE ) 500 MG tablet Take 1 tablet (500 mg total) by mouth daily with breakfast.   metoprolol  succinate (TOPROL -XL) 50 MG 24 hr tablet TAKE 1  AND 1/2 TABLETS EVERY DAY. TAKE WITH OR IMMEDIATELY FOLLOWING A MEAL.   Multiple Vitamins-Minerals (MULTIVITAMIN & MINERAL PO) Take 1 tablet by mouth every other day.    nitroGLYCERIN  (NITROSTAT ) 0.4 MG SL tablet Place 1 tablet (0.4 mg total) under the tongue every 5 (five) minutes as needed for chest pain.   tamsulosin  (FLOMAX ) 0.4 MG CAPS capsule TAKE 2 CAPSULES EVERY DAY   warfarin (COUMADIN ) 2.5 MG tablet TAKE 1 TABLET BY MOUTH DAILY EXCEPT TAKE 1 1/2 TABLETS ON  MONDAYS OR AS DIRECTED BY ANTICOAGULATION CLINIC   [DISCONTINUED] warfarin (COUMADIN ) 2.5 MG tablet TAKE 2 TABLET BY MOUTH DAILY OR TAKE AS DIRECTED BY ANTICOAGULATION CLINIC (Patient taking differently: 2.5 mg. TAKE 2 TABLET BY MOUTH DAILY OR TAKE AS DIRECTED BY ANTICOAGULATION CLINIC)   No facility-administered encounter medications on file as of 11/20/2023.    Allergies (verified) Patient has no known allergies.   History: Past Medical History:  Diagnosis Date   Actinic keratosis    Adenomatous colon polyp    ALLERGIC RHINITIS    Arthritis    CAD, NATIVE VESSEL    a. DES x2 to RCA 2004 b. repeat PTCA to RCA 2008 c. PCI to RCA 2012 non-DES    Cellulitis and abscess of trunk    Diabetes mellitus without complication (HCC)    Diverticulosis    Dysrhythmia    GERD (gastroesophageal reflux disease)    HYPERLIPIDEMIA    HYPERTENSION    OTH MALIG NEOPLASM SKIN OTH\T\UNSPEC PARTS FACE    Permanent atrial fibrillation (HCC)    PONV (postoperative nausea and vomiting)    Presence of permanent cardiac pacemaker    Skin cancer    Tachy-brady syndrome (HCC)    a. s/p MDT dual chamber pacemaker implant complicated by pericardial effusion and micro-perforatation s/p lead revisions   Past Surgical History:  Procedure Laterality Date   BACK SURGERY  2000   for bulging discs   CATARACT EXTRACTION W/ INTRAOCULAR LENS  IMPLANT, BILATERAL  2012   CORONARY ANGIOPLASTY WITH STENT PLACEMENT  2008   CORONARY ANGIOPLASTY WITH STENT PLACEMENT  05/14/11   CYSTECTOMY     from right side of face.   EP IMPLANTABLE DEVICE N/A 12/16/2014   Procedure: PPM Generator Changeout;  Surgeon: Tammie Fall, MD;  Location: [facility] INVASIVE CV LAB;  Service: Cardiovascular;  Laterality: N/A;   LEAD REVISION  2008   11 days post initial implant, RA and RV lead revisions for micro-perforation and pericardial effusion by Dr Carolynne Citron   LEFT HEART CATHETERIZATION WITH CORONARY ANGIOGRAM N/A 05/14/2011   Procedure: LEFT  HEART CATHETERIZATION WITH CORONARY ANGIOGRAM;  Surgeon: Kristopher Pheasant, MD;  Location: [facility] CATH LAB;  Service: Cardiovascular;  Laterality: N/A;   PACEMAKER INSERTION  2008   MDT dual chamber pacemaker implanted by Dr Lavonne Prairie   TOTAL KNEE ARTHROPLASTY Right 08/11/2019   Procedure: TOTAL KNEE ARTHROPLASTY;  Surgeon: Claiborne Crew, MD;  Location: [facility] ORS;  Service: Orthopedics;  Laterality: Right;  70 mins   Family History  Problem Relation Age of Onset   Ovarian cancer Mother    Heart attack Father    Colon cancer Father 96       deceased 39   Diabetes Brother        x 2   Heart disease Brother        x 3   Social History   Socioeconomic History   Marital status: Widowed    Spouse name: Not on file  Number of children: 2   Years of education: Not on file   Highest education level: Not on file  Occupational History   Occupation: retired    Comment: Railroad   Tobacco Use   Smoking status: Never    Passive exposure: Past   Smokeless tobacco: Never  Vaping Use   Vaping status: Never Used  Substance and Sexual Activity   Alcohol use: No    Comment: 05/14/11 last occasional drink was in 1990's   Drug use: No   Sexual activity: Yes  Other Topics Concern   Not on file  Social History Narrative   Widowed March 2016. 2 sons. 4 grandkids- all through college. 1 greatgrandchild on way 11/2015.    Lives by himself.       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   Social Drivers of Corporate investment banker Strain: Low Risk  (11/20/2023)   Overall Financial Resource Strain (CARDIA)    Difficulty of Paying Living Expenses: Not hard at all  Food Insecurity: No Food Insecurity (11/20/2023)   Hunger Vital Sign    Worried About Running Out of Food in the Last Year: Never true    Ran Out of Food in the Last Year: Never true  Transportation Needs: No  Transportation Needs (11/20/2023)   PRAPARE - Administrator, Civil Service (Medical): No    Lack of Transportation (Non-Medical): No  Physical Activity: Insufficiently Active (11/20/2023)   Exercise Vital Sign    Days of Exercise per Week: 7 days    Minutes of Exercise per Session: 20 min  Stress: No Stress Concern Present (11/20/2023)   [employer] of Occupational Health - Occupational Stress Questionnaire    Feeling of Stress: Not at all  Social Connections: Moderately Integrated (11/20/2023)   Social Connection and Isolation Panel    Frequency of Communication with Friends and Family: More than three times a week    Frequency of Social Gatherings with Friends and Family: More than three times a week    Attends Religious Services: More than 4 times per year    Active Member of [employer] or Organizations: Yes    Attends Banker Meetings: 1 to 4 times per year    Marital Status: Widowed    Tobacco Counseling Counseling given: Not Answered    Clinical Intake:  Pre-visit preparation completed: Yes  Pain : No/denies pain     BMI - recorded: 31.79 Nutritional Status: BMI > 30  Obese Diabetes: Yes CBG done?: No Did pt. bring in CBG monitor from home?: No  Lab Results  Component Value Date   HGBA1C 7.2 (H) 06/14/2023   HGBA1C 7.0 (H) 01/23/2023     How often do you need to have someone help you when you read instructions, pamphlets, or other written materials from your doctor or pharmacy?: 1 - Never  Interpreter Needed?: No  Information entered by :: Ovidio Blower, LPN   Activities of Daily Living      11/20/2023   11:17 AM  In your present state of health, do you have any difficulty performing the following activities:  Hearing? 1  Comment slight HOH  Vision? 0  Difficulty concentrating or making decisions? 0  Walking or climbing stairs? 0  Dressing or bathing? 0  Doing errands, shopping? 0  Preparing Food and  eating ? N  Using the  Toilet? N  In the past six months, have you accidently leaked urine? Y  Comment at times  Do you have problems with loss of bowel control? N  Managing your Medications? N  Managing your Finances? N  Housekeeping or managing your Housekeeping? N    Patient Care Team: Almira Jaeger, MD as PCP - General (Family Medicine) Eilleen Grates, MD as PCP - Cardiology (Cardiology) Tammie Fall, MD as PCP - Electrophysiology (Cardiology) Almira Jaeger, MD (Family Medicine) Adelbert Homans, MD as Consulting Physician (Urology) Eva Hikes, MD as Consulting Physician (Dermatology) Corie Diamond, MD as Consulting Physician (Ophthalmology) Claiborne Crew, MD as Consulting Physician (Orthopedic Surgery) Rolando Cliche, [facility] as Pharmacist (Pharmacist) Eilleen Grates, MD as Consulting Physician (Cardiology)   I have updated your Care Teams any recent Medical Services you may have received from other providers in the past year.     Assessment:   This is a routine wellness examination for Shawn [name].  Hearing/Vision screen Hearing Screening - Comments:: Pt stated Slight HOH  Vision Screening - Comments:: Wears rx glasses - up to date with routine eye exams with [facility]     Goals Addressed             This Visit's Progress    Patient Stated       Maintain health and activity        Depression Screen      11/20/2023   11:20 AM 09/13/2023    9:16 AM 11/12/2022    1:08 PM 09/10/2022   10:34 AM 11/24/2021    8:59 AM 10/07/2020    3:23 PM 09/29/2020   10:32 AM  PHQ 2/9 Scores  PHQ - 2 Score 0 0 0 0 0 0 0  PHQ- 9 Score    0   0    Fall Risk      11/20/2023   11:23 AM 09/13/2023    9:16 AM 11/12/2022    1:10 PM 09/10/2022   10:34 AM 11/24/2021    9:01 AM  Fall Risk   Falls in the past year? 0 0 0 0 0  Number falls in past yr: 0  0 0 0  Injury with Fall? 0  0 0 0  Risk for fall due to : No Fall Risks  Impaired vision No Fall Risks Impaired vision  Follow up Falls  prevention discussed  Falls prevention discussed Falls evaluation completed Falls prevention discussed      Data saved with a previous flowsheet row definition    MEDICARE RISK AT HOME:   Medicare Risk at Home Any stairs in or around the home?: Yes If so, are there any without handrails?: No Home free of loose throw rugs in walkways, pet beds, electrical cords, etc?: Yes Adequate lighting in your home to reduce risk of falls?: Yes Life alert?: Yes Use of a cane, walker or w/c?: No Grab bars in the bathroom?: Yes Shower chair or bench in shower?: Yes Elevated toilet seat or a handicapped toilet?: Yes  TIMED UP AND GO:  Was the test performed?  No  Cognitive Function: 6CIT completed        11/20/2023   11:24 AM 11/12/2022    1:11 PM 11/24/2021    9:03 AM 10/07/2020    3:28 PM 07/28/2019   12:26 PM  6CIT Screen  What Year? 0 points 0 points 0 points 0 points 0 points  What month? 0 points 0  points 0 points 0 points 0 points  What time? 0 points 0 points 0 points  0 points  Count back from 20 0 points 0 points 0 points 0 points 0 points  Months in reverse 0 points 0 points 0 points 0 points 0 points  Repeat phrase 0 points 0 points 0 points 6 points 2 points  Total Score 0 points 0 points 0 points  2 points    Immunizations Immunization History  Administered Date(s) Administered   Fluad Quad(high Dose 65+) 04/01/2019, 03/28/2020, 03/31/2021, 03/12/2022   Fluad Trivalent(High Dose 65+) 04/10/2023   Influenza Split 03/24/2012   Influenza Whole 03/10/2008, 03/16/2009, 04/05/2010   Influenza, High Dose Seasonal PF 04/02/2016, 04/08/2017, 03/19/2018, 03/12/2022   Influenza,inj,Quad PF,6+ Mos 03/30/2013, 03/16/2014, 03/11/2015   PFIZER(Purple Top)SARS-COV-2 Vaccination 06/09/2019, 06/26/2019, 07/15/2019, 04/22/2020   Pneumococcal Conjugate-13 09/09/2014   Pneumococcal Polysaccharide-23 11/05/2011   Tdap 11/05/2011   Zoster Recombinant(Shingrix) 03/15/2022, 06/08/2022     Screening Tests Health Maintenance  Topic Date Due   FOOT EXAM  Never done   OPHTHALMOLOGY EXAM  Never done   COVID-19 Vaccine (5 - 2024-25 season) 02/03/2023   HEMOGLOBIN A1C  12/12/2023   INFLUENZA VACCINE  01/03/2024   Medicare Annual Wellness (AWV)  11/19/2024   Pneumococcal Vaccine: 50+ Years  Completed   Zoster Vaccines- Shingrix  Completed   HPV VACCINES  Aged Out   Meningococcal B Vaccine  Aged Out   DTaP/Tdap/Td  Discontinued   Colonoscopy  Discontinued    Health Maintenance  Health Maintenance Due  Topic Date Due   FOOT EXAM  Never done   OPHTHALMOLOGY EXAM  Never done   COVID-19 Vaccine (5 - 2024-25 season) 02/03/2023   Health Maintenance Items Addressed: See Nurse Notes at the end of this note  Additional Screening:  Vision Screening: Recommended annual ophthalmology exams for early detection of glaucoma and other disorders of the eye. Would you like a referral to an eye doctor? No    Dental Screening: Recommended annual dental exams for proper oral hygiene  Community Resource Referral / Chronic Care Management: CRR required this visit?  No   CCM required this visit?  No   Plan:    I have personally reviewed and noted the following in the patient's chart:   Medical and social history Use of alcohol, tobacco or illicit drugs  Current medications and supplements including opioid prescriptions. Patient is not currently taking opioid prescriptions. Functional ability and status Nutritional status Physical activity Advanced directives List of other physicians Hospitalizations, surgeries, and ER visits in previous 12 months Vitals Screenings to include cognitive, depression, and falls Referrals and appointments  In addition, I have reviewed and discussed with patient certain preventive protocols, quality metrics, and best practice recommendations. A written personalized care plan for preventive services as well as general preventive health  recommendations were provided to patient.   Bruno Capri, LPN   07/04/8655   After Visit Summary: (MyChart) Due to this being a telephonic visit, the after visit summary with patients personalized plan was offered to patient via MyChart   Notes: PCP Follow Up Recommendations: Pt will be in on 11/26/23 I noticed his foot exam is due , I also requested records from [facility], pt completed on 11/01/23

## 2023-11-25 ENCOUNTER — Ambulatory Visit (INDEPENDENT_AMBULATORY_CARE_PROVIDER_SITE_OTHER)

## 2023-11-25 DIAGNOSIS — I442 Atrioventricular block, complete: Secondary | ICD-10-CM

## 2023-11-25 LAB — CUP PACEART REMOTE DEVICE CHECK
Battery Impedance: 5252 Ohm
Battery Remaining Longevity: 8 mo
Battery Voltage: 2.68 V
Brady Statistic RV Percent Paced: 98 %
Date Time Interrogation Session: 20250623081130
Implantable Lead Connection Status: 753985
Implantable Lead Connection Status: 753985
Implantable Lead Implant Date: 20081007
Implantable Lead Implant Date: 20081007
Implantable Lead Location: 753859
Implantable Lead Location: 753860
Implantable Lead Model: 5076
Implantable Lead Model: 5076
Implantable Pulse Generator Implant Date: 20160714
Lead Channel Impedance Value: 0 Ohm
Lead Channel Impedance Value: 468 Ohm
Lead Channel Pacing Threshold Amplitude: 0.625 V
Lead Channel Pacing Threshold Pulse Width: 0.4 ms
Lead Channel Setting Pacing Amplitude: 2 V
Lead Channel Setting Pacing Pulse Width: 0.4 ms
Lead Channel Setting Sensing Sensitivity: 1.4 mV
Zone Setting Status: 755011

## 2023-11-26 ENCOUNTER — Ambulatory Visit: Payer: Medicare HMO | Admitting: Family Medicine

## 2023-11-26 ENCOUNTER — Encounter: Payer: Self-pay | Admitting: Family Medicine

## 2023-11-26 VITALS — BP 110/60 | HR 71 | Ht 67.0 in | Wt 200.2 lb

## 2023-11-26 DIAGNOSIS — I480 Paroxysmal atrial fibrillation: Secondary | ICD-10-CM | POA: Diagnosis not present

## 2023-11-26 DIAGNOSIS — Z0001 Encounter for general adult medical examination with abnormal findings: Secondary | ICD-10-CM | POA: Diagnosis not present

## 2023-11-26 DIAGNOSIS — E119 Type 2 diabetes mellitus without complications: Secondary | ICD-10-CM | POA: Diagnosis not present

## 2023-11-26 DIAGNOSIS — Z23 Encounter for immunization: Secondary | ICD-10-CM | POA: Diagnosis not present

## 2023-11-26 DIAGNOSIS — Z7984 Long term (current) use of oral hypoglycemic drugs: Secondary | ICD-10-CM | POA: Diagnosis not present

## 2023-11-26 DIAGNOSIS — I442 Atrioventricular block, complete: Secondary | ICD-10-CM

## 2023-11-26 DIAGNOSIS — I10 Essential (primary) hypertension: Secondary | ICD-10-CM

## 2023-11-26 DIAGNOSIS — S51831A Puncture wound without foreign body of right forearm, initial encounter: Secondary | ICD-10-CM

## 2023-11-26 DIAGNOSIS — Z0000 Encounter for general adult medical examination without abnormal findings: Secondary | ICD-10-CM

## 2023-11-26 NOTE — Patient Instructions (Addendum)
 Consider follow-up COVID and flu shot.   Plain Td today- 10 year tetanus shot under puncture wound right forearm  Team please request copy of diabetic eye exam  Please stop by lab before you go If you have mychart- we will send your results within 3 business days of us  receiving them.  If you do not have mychart- we will call you about results within 5 business days of us  receiving them.  *please also note that you will see labs on mychart as soon as they post. I will later go in and write notes on them- will say notes from Dr. Katrinka   Recommended follow up: Return in about 6 months (around 05/27/2024) for followup or sooner if needed.Schedule b4 you leave.

## 2023-11-26 NOTE — Progress Notes (Addendum)
 Phone: [phone]   Subjective:  Patient presents today for their annual physical. Chief complaint-noted.   See problem oriented charting- ROS- full  review of systems was completed and negative  Per full ROS sheet completed by patient except for topics noted under acute/chronic concerns  The following were reviewed and entered/updated in epic: Past Medical History:  Diagnosis Date   Actinic keratosis    Adenomatous colon polyp    ALLERGIC RHINITIS    Arthritis    CAD, NATIVE VESSEL    a. DES x2 to RCA 2004 b. repeat PTCA to RCA 2008 c. PCI to RCA 2012 non-DES    Cellulitis and abscess of trunk    Diabetes mellitus without complication (HCC)    Diverticulosis    Dysrhythmia    GERD (gastroesophageal reflux disease)    HYPERLIPIDEMIA    HYPERTENSION    OTH MALIG NEOPLASM SKIN OTH\T\UNSPEC PARTS FACE    Permanent atrial fibrillation (HCC)    PONV (postoperative nausea and vomiting)    Presence of permanent cardiac pacemaker    Skin cancer    Tachy-brady syndrome (HCC)    a. s/p MDT dual chamber pacemaker implant complicated by pericardial effusion and micro-perforatation s/p lead revisions   Patient Active Problem List   Diagnosis Date Noted   Diabetes mellitus without complication (HCC) 06/14/2023    Priority: High   Complete heart block (HCC) 04/02/2019    Priority: High   CAD, NATIVE VESSEL 09/27/2008    Priority: High   PACEMAKER, PERMANENT 03/11/2007    Priority: High   Atrial fibrillation (HCC) 02/11/2007    Priority: High   Benign paroxysmal positional vertigo of right ear 09/24/2019    Priority: Medium    Status post total right knee replacement 08/11/2019    Priority: Medium    History of adenomatous polyp of colon 09/29/2014    Priority: Medium    BPH associated with nocturia 09/09/2014    Priority: Medium    Hyperlipidemia associated with type 2 diabetes mellitus (HCC) 03/10/2008    Priority: Medium    Essential hypertension 12/02/2006    Priority:  Medium    Aortic atherosclerosis (HCC) 10/28/2018    Priority: Low   Long term (current) use of anticoagulants 02/18/2017    Priority: Low   Encounter for therapeutic drug monitoring 09/24/2013    Priority: Low   History of thrombocytopenia 06/17/2011    Priority: Low   Hx of skin cancer, basal cell 04/07/2008    Priority: Low   Actinic keratosis 04/07/2008    Priority: Low   Allergic rhinitis 12/02/2006    Priority: Low   GERD 12/02/2006    Priority: Low   Leg swelling 12/02/2022   Benign microscopic hematuria 10/02/2018   Past Surgical History:  Procedure Laterality Date   BACK SURGERY  2000   for bulging discs   CATARACT EXTRACTION W/ INTRAOCULAR LENS  IMPLANT, BILATERAL  2012   CORONARY ANGIOPLASTY WITH STENT PLACEMENT  2008   CORONARY ANGIOPLASTY WITH STENT PLACEMENT  05/14/11   CYSTECTOMY     from right side of face.   EP IMPLANTABLE DEVICE N/A 12/16/2014   Procedure: PPM Generator Changeout;  Surgeon: Danelle LELON Birmingham, MD;  Location: [facility] INVASIVE CV LAB;  Service: Cardiovascular;  Laterality: N/A;   LEAD REVISION  2008   11 days post initial implant, RA and RV lead revisions for micro-perforation and pericardial effusion by Dr Birmingham   LEFT HEART CATHETERIZATION WITH CORONARY ANGIOGRAM N/A 05/14/2011   Procedure:  LEFT HEART CATHETERIZATION WITH CORONARY ANGIOGRAM;  Surgeon: Debby JONETTA Como, MD;  Location: [facility] CATH LAB;  Service: Cardiovascular;  Laterality: N/A;   PACEMAKER INSERTION  2008   MDT dual chamber pacemaker implanted by Dr Lavona   TOTAL KNEE ARTHROPLASTY Right 08/11/2019   Procedure: TOTAL KNEE ARTHROPLASTY;  Surgeon: Ernie Cough, MD;  Location: [facility] ORS;  Service: Orthopedics;  Laterality: Right;  70 mins    Family History  Problem Relation Age of Onset   Ovarian cancer Mother    Heart attack Father    Colon cancer Father 69       deceased 22   Diabetes Brother        x 2   Heart disease Brother        x 3    Medications- reviewed and  updated Current Outpatient Medications  Medication Sig Dispense Refill   acetaminophen  (TYLENOL ) 325 MG tablet Take 325-650 mg by mouth every 6 (six) hours as needed (pain).     albuterol  (VENTOLIN  HFA) 108 (90 Base) MCG/ACT inhaler Inhale 2 puffs into the lungs every 6 (six) hours as needed for wheezing or shortness of breath (With acute illness. no asthma or COPD history). 1 each 0   atorvastatin  (LIPITOR) 40 MG tablet TAKE 1 TABLET EVERY DAY AT 6PM 90 tablet 3   brimonidine (ALPHAGAN) 0.2 % ophthalmic solution Place 1 drop into both eyes in the morning and at bedtime.     diltiazem  (CARDIZEM  CD) 240 MG 24 hr capsule TAKE 1 CAPSULE EVERY DAY 90 capsule 3   finasteride  (PROSCAR ) 5 MG tablet Take 1 tablet (5 mg total) by mouth daily. 90 tablet 3   fluticasone  (FLONASE ) 50 MCG/ACT nasal spray Place 2 sprays into both nostrils daily. 48 g 3   furosemide  (LASIX ) 20 MG tablet TAKE 1 TABLET ON TUES, FRI. TAKE 1 TAB AS NEEDED FOR WEIGHT GAIN OF 3 POUNDS OR MORE ON SAT, SUN, MON, AND WED AS DIRECTED 30 tablet 3   metFORMIN  (GLUCOPHAGE ) 500 MG tablet Take 1 tablet (500 mg total) by mouth daily with breakfast. 90 tablet 3   metoprolol  succinate (TOPROL -XL) 50 MG 24 hr tablet TAKE 1 AND 1/2 TABLETS EVERY DAY. TAKE WITH OR IMMEDIATELY FOLLOWING A MEAL. 135 tablet 3   Multiple Vitamins-Minerals (MULTIVITAMIN & MINERAL PO) Take 1 tablet by mouth every other day.      nitroGLYCERIN  (NITROSTAT ) 0.4 MG SL tablet Place 1 tablet (0.4 mg total) under the tongue every 5 (five) minutes as needed for chest pain. 75 tablet 3   tamsulosin  (FLOMAX ) 0.4 MG CAPS capsule TAKE 2 CAPSULES EVERY DAY 180 capsule 3   warfarin (COUMADIN ) 2.5 MG tablet TAKE 1 TABLET BY MOUTH DAILY EXCEPT TAKE 1 1/2 TABLETS ON MONDAYS OR AS DIRECTED BY ANTICOAGULATION CLINIC 115 tablet 1   No current facility-administered medications for this visit.    Allergies-reviewed and updated No Known Allergies  Social History   Social History  Narrative   Widowed March 2016. 2 sons. 4 grandkids- all through college. 1 greatgrandchild on way 11/2015.    Lives by himself.       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   Objective  Objective:  BP 110/60   Pulse 71   Ht 5' 7 (1.702 m)   Wt 200 lb 3.2 oz (90.8 kg)  SpO2 99%   BMI 31.36 kg/m  Gen: NAD, resting comfortably HEENT: Mucous membranes are moist. Oropharynx normal Neck: no thyromegaly CV: RRR no murmurs rubs or gallops Lungs: CTAB no crackles, wheeze, rhonchi Abdomen: soft/nontender/nondistended/normal bowel sounds. No rebound or guarding.  Ext: see below, small puncture wound on right forearm that appears to be healing well with no surrounding erythema Skin: warm, dry, several prior skin surgery scars on scalp Neuro: grossly normal, moves all extremities, PERRLA  Diabetic foot exam was performed with the following findings:   No deformities, ulcerations, or other skin breakdown Normal sensation of 10g monofilament Intact posterior tibialis and dorsalis pedis pulses Venous stasis skin changes on legs with trace edema      Assessment and Plan  89 y.o. male presenting for annual physical.  Health Maintenance counseling: 1. Anticipatory guidance: Patient counseled regarding regular dental exams q6 months, eye exams -yearly with Dr. Camillo,  avoiding smoking and second hand smoke , limiting alcohol to 2 beverages per day - none, no illicit drugs .   2. Risk factor reduction:  Advised patient of need for regular exercise and diet rich and fruits and vegetables to reduce risk of heart attack and stroke.  Exercise-doing yard work in the spring and summer and try to walk regularly. Advised be careful in the heat.  Diet/weight management-weight down 1 pound from last year-tries to eat reasonably healthy.  Wt Readings from Last 3 Encounters:   11/26/23 200 lb 3.2 oz (90.8 kg)  11/20/23 203 lb (92.1 kg)  11/05/23 203 lb (92.1 kg)  3. Immunizations/screenings/ancillary studies-consider fall COVID shot and flu shot.  Recommended Prevnar 20- in future.  Also recommended Tdap at pharmacy- but with puncture wound do today today  Immunization History  Administered Date(s) Administered   Fluad Quad(high Dose 65+) 04/01/2019, 03/28/2020, 03/31/2021, 03/12/2022   Fluad Trivalent(High Dose 65+) 04/10/2023   Influenza Split 03/24/2012   Influenza Whole 03/10/2008, 03/16/2009, 04/05/2010   Influenza, High Dose Seasonal PF 04/02/2016, 04/08/2017, 03/19/2018, 03/12/2022   Influenza,inj,Quad PF,6+ Mos 03/30/2013, 03/16/2014, 03/11/2015   PFIZER(Purple Top)SARS-COV-2 Vaccination 06/09/2019, 06/26/2019, 07/15/2019, 04/22/2020   Pneumococcal Conjugate-13 09/09/2014   Pneumococcal Polysaccharide-23 11/05/2011   Tdap 11/05/2011   Zoster Recombinant(Shingrix) 03/15/2022, 06/08/2022    4. Prostate cancer screening-  past age based screening recommendations   5. Colon cancer screening - patient had adenomatous colon polyp in 2017 with 5-year follow-up given as an option-he did not pursue this. Does have slightly darker stools but feels food related and no clear melena 6. Skin cancer screening-sees dermatology every 6 months. advised regular sunscreen use. Denies worrisome, changing, or new skin lesions.  7. Smoking associated screening (lung cancer screening, AAA screen 65-75, UA)-never smoker 8. STD screening -only active with long-term friend and monogamous-declines screening  Status of chronic or acute concerns    #Puncture wound on right arm from [place]- due for today- give today  #cyst vs lipoma on back of neck- freely mobile- offered general surgery consult and declines unless worsens-stable right now- can also mention to dermatology   # Left lower quadrant pain/left thoracic back pain-he reports feeling better after last visit-seemed to  agitate issue when he worked on his lawn before last visit- has been much better thankfully. No left lower quadrant pain and back pain very minimal- twinge a few days ago but better.    # Diabetes-new diagnosis January 23, 2023 S: Medication:metformin  500 mg daily  Lab Results  Component Value Date   HGBA1C 7.2 (H) 06/14/2023  HGBA1C 7.0 (H) 01/23/2023  A/P: hopefully stable or improved- update a1c today. Continue current meds for now   #CAD with history of stents #hyperlipidemia # Aortic atherosclerosis S: Medication: Atorvastatin  40Mg , Coumadin  (not on aspirin  as a result) -no chest pain or shortness of breath Lab Results  Component Value Date   CHOL 94 01/23/2023   HDL 32.30 (L) 01/23/2023   LDLCALC 39 01/23/2023   LDLDIRECT 35.0 06/20/2022   TRIG 114.0 01/23/2023   CHOLHDL 3 01/23/2023   A/P: coronary artery disease asymptomatic continue current medications Aortic atherosclerosis (presumed stable)- LDL goal ideally <70 - continue current medications as at goal- update today  # Atrial fibrillation with pacemaker due to history of tachybradycardia and complete heart block syndrome-follows Dr. Waddell and Dr. Lavona S: Rate controlled with diltiazem  240 mg extended release, metoprolol  75 mg extended release Anticoagulated with Coumadin -follows with [place] [facility] A/P: appropriately anticoagulated and rate controlled- continue current medicine   #hypertension S: medication: Metoprolol  75 mg extended release, diltiazem  240 mg extended release, also on Lasix  twice a week to help with edema through Dr. Lavona A/P: well controlled continue current medications   #BPH S: Medication: Finasteride  5 mg daily, tamsulosin  0.8 mg daily  A/P: reasonable control other than lasix  days   Recommended follow up: Return in about 6 months (around 05/27/2024) for followup or sooner if needed.Schedule b4 you leave. Future Appointments  Date Time Provider Department Center  12/18/2023 10:30 AM  [facility] COUMADIN  CLINIC [facility] PEC  12/26/2023  7:20 AM [facility] DEVICE REMOTES [facility]  01/27/2024  7:20 AM [facility] DEVICE REMOTES [facility]  02/27/2024  7:20 AM [facility] DEVICE REMOTES [facility]  03/30/2024  7:20 AM [facility] DEVICE REMOTES [facility]  04/30/2024  7:20 AM [facility] DEVICE REMOTES [facility]  06/01/2024  7:20 AM [facility] DEVICE REMOTES [facility]  07/02/2024  7:20 AM [facility] DEVICE REMOTES [facility]  08/03/2024  7:20 AM [facility] DEVICE REMOTES [facility]  09/03/2024  7:20 AM [facility] HVT DEVICE REMOTES [facility]  11/24/2024 11:20 AM [facility] ANNUAL WELLNESS VISIT 1 [facility]   Lab/Order associations:NOT fasting- crackers and water    ICD-10-CM   1. Preventative health care  Z00.00     2. Paroxysmal atrial fibrillation (HCC)  I48.0     3. Complete heart block (HCC)  I44.2     4. Diabetes mellitus without complication (HCC)  E11.9 Comprehensive metabolic panel with GFR    CBC with Differential/Platelet    Lipid panel    Hemoglobin A1c    5. Essential hypertension  I10 Comprehensive metabolic panel with GFR    CBC with Differential/Platelet    Lipid panel    6. Puncture wound of right forearm, initial encounter  S51.831A       No orders of the defined types were placed in this encounter.   Return precautions advised.  Garnette Lukes, MD

## 2023-11-26 NOTE — Addendum Note (Signed)
 Addended by: Kasir Hallenbeck L on: 11/26/2023 03:50 PM   Modules accepted: Orders

## 2023-11-27 LAB — COMPREHENSIVE METABOLIC PANEL WITH GFR
ALT: 16 U/L (ref 0–53)
AST: 15 U/L (ref 0–37)
Albumin: 4.2 g/dL (ref 3.5–5.2)
Alkaline Phosphatase: 72 U/L (ref 39–117)
BUN: 15 mg/dL (ref 6–23)
CO2: 28 meq/L (ref 19–32)
Calcium: 9.6 mg/dL (ref 8.4–10.5)
Chloride: 105 meq/L (ref 96–112)
Creatinine, Ser: 1.12 mg/dL (ref 0.40–1.50)
GFR: 59.34 mL/min — ABNORMAL LOW (ref >60.00)
Glucose, Bld: 102 mg/dL — ABNORMAL HIGH (ref 70–99)
Potassium: 4.1 meq/L (ref 3.5–5.1)
Sodium: 142 meq/L (ref 135–145)
Total Bilirubin: 0.8 mg/dL (ref 0.2–1.2)
Total Protein: 7.3 g/dL (ref 6.0–8.3)

## 2023-11-27 LAB — CBC WITH DIFFERENTIAL/PLATELET
Basophils Absolute: 0.1 10*3/uL (ref 0.0–0.1)
Basophils Relative: 1.1 % (ref 0.0–3.0)
Eosinophils Absolute: 0.3 10*3/uL (ref 0.0–0.7)
Eosinophils Relative: 4.3 % (ref 0.0–5.0)
HCT: 41.5 % (ref 39.0–52.0)
Hemoglobin: 14.1 g/dL (ref 13.0–17.0)
Lymphocytes Relative: 25 % (ref 12.0–46.0)
Lymphs Abs: 2 10*3/uL (ref 0.7–4.0)
MCHC: 33.9 g/dL (ref 30.0–36.0)
MCV: 90.7 fl (ref 78.0–100.0)
Monocytes Absolute: 0.6 10*3/uL (ref 0.1–1.0)
Monocytes Relative: 7.8 % (ref 3.0–12.0)
Neutro Abs: 4.9 10*3/uL (ref 1.4–7.7)
Neutrophils Relative %: 61.8 % (ref 43.0–77.0)
Platelets: 164 10*3/uL (ref 150.0–400.0)
RBC: 4.57 Mil/uL (ref 4.22–5.81)
RDW: 14 % (ref 11.5–15.5)
WBC: 7.9 10*3/uL (ref 4.0–10.5)

## 2023-11-27 LAB — HEMOGLOBIN A1C: Hgb A1c MFr Bld: 6.8 % — ABNORMAL HIGH (ref 4.6–6.5)

## 2023-11-27 LAB — LIPID PANEL
Cholesterol: 87 mg/dL (ref 0–200)
HDL: 34.1 mg/dL — ABNORMAL LOW (ref >39.00)
LDL Cholesterol: 32 mg/dL (ref 0–99)
NonHDL: 52.92
Total CHOL/HDL Ratio: 3
Triglycerides: 105 mg/dL (ref 0.0–149.0)
VLDL: 21 mg/dL (ref 0.0–40.0)

## 2023-12-01 ENCOUNTER — Ambulatory Visit: Payer: Self-pay | Admitting: Internal Medicine

## 2023-12-02 ENCOUNTER — Other Ambulatory Visit: Payer: Self-pay | Admitting: Family Medicine

## 2023-12-02 DIAGNOSIS — I10 Essential (primary) hypertension: Secondary | ICD-10-CM

## 2023-12-03 ENCOUNTER — Ambulatory Visit: Payer: Self-pay | Admitting: Family Medicine

## 2023-12-18 ENCOUNTER — Ambulatory Visit (INDEPENDENT_AMBULATORY_CARE_PROVIDER_SITE_OTHER): Payer: Self-pay

## 2023-12-18 DIAGNOSIS — Z7901 Long term (current) use of anticoagulants: Secondary | ICD-10-CM

## 2023-12-18 LAB — POCT INR: INR: 2.6 (ref 2.0–3.0)

## 2023-12-18 NOTE — Progress Notes (Signed)
 I have reviewed and agree with note, evaluation, plan.   Tana Conch, MD

## 2023-12-18 NOTE — Patient Instructions (Addendum)
 Pre visit review using our clinic review tool, if applicable. No additional management support is needed unless otherwise documented below in the visit note.  Continue 1 tablet daily except take 1 1/2 tablets on Monday. Recheck in 6 weeks.

## 2023-12-18 NOTE — Progress Notes (Signed)
 Continue 1 tablet daily except take 1 1/2 tablets on Monday. Recheck in 6 weeks.

## 2023-12-25 ENCOUNTER — Other Ambulatory Visit: Payer: Self-pay

## 2023-12-25 MED ORDER — FLUTICASONE PROPIONATE 50 MCG/ACT NA SUSP: 2.0000 | Freq: Every day | NASAL | 3 refills | Status: AC

## 2023-12-26 ENCOUNTER — Ambulatory Visit

## 2023-12-27 LAB — CUP PACEART REMOTE DEVICE CHECK
Battery Impedance: 5261 Ohm
Battery Remaining Longevity: 8 mo
Battery Voltage: 2.67 V
Brady Statistic RV Percent Paced: 98 %
Date Time Interrogation Session: 20250724091904
Implantable Lead Connection Status: 753985
Implantable Lead Connection Status: 753985
Implantable Lead Implant Date: 20081007
Implantable Lead Implant Date: 20081007
Implantable Lead Location: 753859
Implantable Lead Location: 753860
Implantable Lead Model: 5076
Implantable Lead Model: 5076
Implantable Pulse Generator Implant Date: 20160714
Lead Channel Impedance Value: 0 Ohm
Lead Channel Impedance Value: 500 Ohm
Lead Channel Pacing Threshold Amplitude: 0.625 V
Lead Channel Pacing Threshold Pulse Width: 0.4 ms
Lead Channel Setting Pacing Amplitude: 2 V
Lead Channel Setting Pacing Pulse Width: 0.4 ms
Lead Channel Setting Sensing Sensitivity: 1.4 mV
Zone Setting Status: 755011

## 2023-12-30 ENCOUNTER — Ambulatory Visit: Payer: Self-pay | Admitting: Internal Medicine

## 2023-12-30 NOTE — Progress Notes (Signed)
 Remote pacemaker transmission.

## 2024-01-06 ENCOUNTER — Telehealth: Payer: Self-pay

## 2024-01-06 NOTE — Telephone Encounter (Signed)
 Please call and schedule ov with available provider to be evaluated.  Copied from CRM [id number]. Topic: Clinical - Medical Advice >> Jan 06, 2024 12:06 PM Drema MATSU wrote: Reason for CRM: Patient is requesting a callback from Dr. Katrinka. Patient fell Friday evening and he has a a spot on his leg that needs to be looked at.  *Patient  did not want to speak to Nurse Triage ----------------------------------------------------------------------- From previous Reason for Contact - Red Word Triage: Red Word that prompted transfer to Nurse Triage:

## 2024-01-20 ENCOUNTER — Ambulatory Visit: Admitting: Family Medicine

## 2024-01-20 ENCOUNTER — Ambulatory Visit: Payer: Self-pay

## 2024-01-20 ENCOUNTER — Encounter: Payer: Self-pay | Admitting: Family Medicine

## 2024-01-20 VITALS — BP 128/73 | HR 82 | Temp 97.2°F | Ht 67.0 in | Wt 195.8 lb

## 2024-01-20 DIAGNOSIS — L0390 Cellulitis, unspecified: Secondary | ICD-10-CM | POA: Diagnosis not present

## 2024-01-20 DIAGNOSIS — S81811A Laceration without foreign body, right lower leg, initial encounter: Secondary | ICD-10-CM | POA: Diagnosis not present

## 2024-01-20 DIAGNOSIS — I480 Paroxysmal atrial fibrillation: Secondary | ICD-10-CM

## 2024-01-20 DIAGNOSIS — I10 Essential (primary) hypertension: Secondary | ICD-10-CM | POA: Diagnosis not present

## 2024-01-20 MED ORDER — CEPHALEXIN 500 MG PO CAPS: 500.0000 mg | ORAL_CAPSULE | Freq: Four times a day (QID) | ORAL | 0 refills | Status: DC

## 2024-01-20 MED ORDER — MUPIROCIN 2 % EX OINT: 1.0000 | TOPICAL_OINTMENT | Freq: Two times a day (BID) | CUTANEOUS | 0 refills | Status: AC

## 2024-01-20 NOTE — Progress Notes (Signed)
   Shawn [name] is a 89 y.o. male who presents today for an office visit.  Assessment/Plan:  New/Acute Problems: Cellulitis/skin tear No red flags.  No signs of systemic symptoms.  Does have healthy granulation tissue present however some surrounding erythema and pain consistent with cellulitis.  We will start course of Keflex .  Also sending prescription for topical mupirocin .  We discussed wound care.  Dissipate this will take several more weeks to heal up.  He will follow-up with us  in a couple of weeks to check on wound healing.  We discussed reasons to return to care or seek emergent care.  Essential hypertension At goal today on metoprolol  succinate 75 mg daily and diltiazem  240 mg daily  Atrial fibrillation Anticoagulated on warfarin.  He does have scattered senile purpura however no signs of hematoma or ongoing bleeding.    Subjective:  HPI:  See assessment / plan for status of chronic conditions. Patient is here today with wound on his right lower leg. This started 3 weeks ago after suffering a mechanical fall at home.   Discussed the use of AI scribe software for clinical note transcription with the patient, who gave verbal consent to proceed.  History of Present Illness Shawn [name] is an 89 year old male who presents with a skin tear on his leg after a fall.  Approximately three weeks ago, he experienced a fall while carrying groceries into his house around 8:00 PM. He lost his balance after setting down a bag to retrieve his keys, resulting in a fall on his porch. During the fall, he sustained a skin tear on his right lower leg, which he described as taking the skin off and being quite sore.  The skin tear has been present for nearly three weeks. Initially, it was slow to heal, but he noted improvement over the last two days. After a shower, a part of the skin came off. He has been managing the wound with a large patch and a wrap around his leg. He also applied  Vaseline to the area, but noted that it would sometimes drizzle down when it got hot.  Both of his granddaughters are nurses and they were concerned about purulent drainage from the area.  They would like for him to be checked for infection  No fever or increased pain, but the area is still a bit sore.         Objective:  Physical Exam: BP 128/73   Pulse 82   Temp (!) 97.2 F (36.2 C) (Temporal)   Ht 5' 7 (1.702 m)   Wt 195 lb 12.8 oz (88.8 kg)   SpO2 96%   BMI 30.67 kg/m   Gen: No acute distress, resting comfortably Skin: Approximately 10 x 3 cm skin tear on right lower extremity with healthy granulation tissue present and overlying eschar.  Small amount of overlying erythema and tenderness palpation especially along medial aspect.  No purulent drainage present.  Neuro: Grossly normal, moves all extremities Psych: Normal affect and thought content        Ytzel Gubler M. Kennyth, MD 01/20/2024 10:51 AM

## 2024-01-20 NOTE — Telephone Encounter (Signed)
 Noted. Pt seeing [facility].

## 2024-01-20 NOTE — Patient Instructions (Signed)
 It was very nice to see you today!  VISIT SUMMARY: You visited us  today due to a skin tear on your leg that you sustained after a fall three weeks ago. The wound showed signs of a secondary infection, and we have provided a treatment plan to help it heal properly.  YOUR PLAN: SKIN TEAR WITH SECONDARY LOCAL INFECTION: You have a skin tear on your right lower leg that has developed a secondary infection, indicated by yellow discharge and soreness. -We have prescribed an oral antibiotic and mupirocin  topical ointment to treat the infection. -Keep the wound covered and apply slight compression with a wrap. -We took a photograph of the wound for future comparison. -Please schedule a follow-up appointment in two weeks to monitor the healing process. -We will wrap the wound before you leave today.  Return in about 2 weeks (around 02/03/2024) for Follow Up.   Take care, Dr Kennyth  PLEASE NOTE:  If you had any lab tests, please let us  know if you have not heard back within a few days. You may see your results on mychart before we have a chance to review them but we will give you a call once they are reviewed by us .   If we ordered any referrals today, please let us  know if you have not heard from their office within the next week.   If you had any urgent prescriptions sent in today, please check with the pharmacy within an hour of our visit to make sure the prescription was transmitted appropriately.   Please try these tips to maintain a healthy lifestyle:  Eat at least 3 REAL meals and 1-2 snacks per day.  Aim for no more than 5 hours between eating.  If you eat breakfast, please do so within one hour of getting up.   Each meal should contain half fruits/vegetables, one quarter protein, and one quarter carbs (no bigger than a computer mouse)  Cut down on sweet beverages. This includes juice, soda, and sweet tea.   Drink at least 1 glass of water  with each meal and aim for at least 8 glasses  per day  Exercise at least 150 minutes every week.

## 2024-01-20 NOTE — Telephone Encounter (Signed)
 FYI Only or Action Required?: FYI only for provider.  Patient was last seen in primary care on 11/26/2023 by Katrinka Garnette KIDD, MD.  Called Nurse Triage reporting Wound Infection and Leg Injury.  Symptoms began several weeks ago.  Interventions attempted: OTC medications: Vaseline; Tylenol .  Symptoms are: right painful, superficial red scab leg wound roughly 1.25 inches wide and 2.5 inches long gradually worsening.  Triage Disposition: See HCP Within 4 Hours (Or PCP Triage)  Patient/caregiver understands and will follow disposition?: Yes            Copied from CRM [id number]. Topic: Clinical - Red Word Triage >> Jan 20, 2024  7:44 AM Zy'onna H wrote: [facility] that prompted transfer to Nurse Triage:  Suspecting a blood infection - scraped leg a week ago due to Fall on Right leg - Tried to nurse it on his own   - Pain in the front of leg   Warm Transfer Reason for Disposition  [1] Skin around the wound has become red AND [2] larger than 2 inches (5 cm)  Answer Assessment - Initial Assessment Questions 1. LOCATION: Where is the wound located?      Right leg between ankle and knee, on the shin just below knee a couple inches.  2. WOUND APPEARANCE: What does the wound look like?      He states it is a shallow wound, took just the top of his skin off. He states he is on blood thinners and it looks like it just took off his skin. Denies a deep or crater like wound. He states it is scabbed over and looks yellow he states when he puts Vaseline on it. He states today the scab is reddish-like.  3. SIZE: If redness is present, ask: What is the size of the red area? (Inches, centimeters, or compare to size of a coin)      Wound is 1.25 wide and 2.5 inches long. He states right around the wound is red.  4. SPREAD: What's changed in the last day?  Do you see any red streaks coming from the wound?     No red streaks coming from wound. No worsening since yesterday.  5.  ONSET: When did it start to look infected?      Wound started 2 weeks ago; started looking infected all of last week.  6. MECHANISM: How did the wound start, what was the cause?     Patient had a fall 2 weeks ago, states he had his hands full when he was coming in the house. The floor was wet from the rain. He states he tried to put stuff down to get in the house and he fell. He states he isn't sure when he hit it on something.  7. PAIN: Do you have any pain?  If Yes, ask: How bad is the pain?  (e.g., Scale 1-10; mild, moderate, or severe)     Sore; unable to state severity. He states it is intermittent and comes in waves.  8. FEVER: Do you have a fever? If Yes, ask: What is your temperature, how was it measured, and when did it start?     He has not checked with a thermometer but has felt feverish.  9. OTHER SYMPTOMS: Do you have any other symptoms? (e.g., shaking chills, weakness, rash elsewhere on body)     Warmth at wound.  10. PREGNANCY: Is there any chance you are pregnant? When was your last menstrual period?  N/A.  Protocols used: Wound Infection Suspected-A-AH

## 2024-01-21 ENCOUNTER — Telehealth: Payer: Self-pay

## 2024-01-21 NOTE — Telephone Encounter (Signed)
 Called and spoke with Orie at [facility]. Santina over abx with her and instructions.

## 2024-01-21 NOTE — Telephone Encounter (Signed)
 Copied from CRM [id number]. Topic: Clinical - Medication Question >> Jan 21, 2024 11:32 AM Drema MATSU wrote: Reason for CRM: Orie stated that patient is at the clinic and the dental hygienist wants to know what medication was prescribed to patient when he scraped his knee or fell.

## 2024-01-25 ENCOUNTER — Other Ambulatory Visit: Payer: Self-pay | Admitting: Family Medicine

## 2024-01-27 ENCOUNTER — Ambulatory Visit (INDEPENDENT_AMBULATORY_CARE_PROVIDER_SITE_OTHER)

## 2024-01-27 DIAGNOSIS — I442 Atrioventricular block, complete: Secondary | ICD-10-CM

## 2024-01-28 LAB — CUP PACEART REMOTE DEVICE CHECK
Battery Impedance: 5292 Ohm
Battery Remaining Longevity: 8 mo
Battery Voltage: 2.68 V
Brady Statistic RV Percent Paced: 98 %
Date Time Interrogation Session: 20250825125255
Implantable Lead Connection Status: 753985
Implantable Lead Connection Status: 753985
Implantable Lead Implant Date: 20081007
Implantable Lead Implant Date: 20081007
Implantable Lead Location: 753859
Implantable Lead Location: 753860
Implantable Lead Model: 5076
Implantable Lead Model: 5076
Implantable Pulse Generator Implant Date: 20160714
Lead Channel Impedance Value: 0 Ohm
Lead Channel Impedance Value: 468 Ohm
Lead Channel Pacing Threshold Amplitude: 0.625 V
Lead Channel Pacing Threshold Pulse Width: 0.4 ms
Lead Channel Setting Pacing Amplitude: 2 V
Lead Channel Setting Pacing Pulse Width: 0.4 ms
Lead Channel Setting Sensing Sensitivity: 1.4 mV
Zone Setting Status: 755011

## 2024-01-29 ENCOUNTER — Ambulatory Visit: Payer: Self-pay | Admitting: Internal Medicine

## 2024-01-29 ENCOUNTER — Ambulatory Visit (INDEPENDENT_AMBULATORY_CARE_PROVIDER_SITE_OTHER)

## 2024-01-29 DIAGNOSIS — Z7901 Long term (current) use of anticoagulants: Secondary | ICD-10-CM

## 2024-01-29 LAB — POCT INR: INR: 2.1 (ref 2.0–3.0)

## 2024-01-29 NOTE — Patient Instructions (Addendum)
 Pre visit review using our clinic review tool, if applicable. No additional management support is needed unless otherwise documented below in the visit note.  Continue 1 tablet daily except take 1 1/2 tablets on Monday. Recheck in 6 weeks.

## 2024-01-29 NOTE — Progress Notes (Signed)
 Pt had a fall at the first of the month with resulting leg scrape. Pt had OV on 8/18 and was diagnosed with cellulitis also and prescribed Keflex . No interaction with warfarin.  Continue 1 tablet daily except take 1 1/2 tablets on Monday. Recheck in 6 weeks.

## 2024-01-29 NOTE — Progress Notes (Signed)
 I have reviewed and agree with note, evaluation, plan.   Garnette Lukes, MD

## 2024-02-04 ENCOUNTER — Encounter: Payer: Self-pay | Admitting: Family Medicine

## 2024-02-04 ENCOUNTER — Ambulatory Visit: Admitting: Family Medicine

## 2024-02-04 VITALS — BP 118/58 | HR 77 | Temp 98.0°F | Ht 67.0 in | Wt 198.6 lb

## 2024-02-04 DIAGNOSIS — Z7984 Long term (current) use of oral hypoglycemic drugs: Secondary | ICD-10-CM

## 2024-02-04 DIAGNOSIS — I480 Paroxysmal atrial fibrillation: Secondary | ICD-10-CM

## 2024-02-04 DIAGNOSIS — E119 Type 2 diabetes mellitus without complications: Secondary | ICD-10-CM

## 2024-02-04 DIAGNOSIS — I10 Essential (primary) hypertension: Secondary | ICD-10-CM | POA: Diagnosis not present

## 2024-02-04 DIAGNOSIS — L03115 Cellulitis of right lower limb: Secondary | ICD-10-CM

## 2024-02-04 DIAGNOSIS — I2510 Atherosclerotic heart disease of native coronary artery without angina pectoris: Secondary | ICD-10-CM

## 2024-02-04 NOTE — Progress Notes (Signed)
 Phone [phone] In person visit   Subjective:   Shawn [name] is a 89 y.o. year old very pleasant male patient who presents for/with See problem oriented charting Chief Complaint  Patient presents with   Cellulitis    My teamdiscussed the use of AI scribe software for clinical note transcription with the patient, who gave verbal consent to proceed.  History of Present Illness   Shawn [name] is an 89 year old male with diabetes who presents with a wound on his right lower leg.  He sustained a skin tear on his right lower leg three weeks ago following a mechanical fall at home while carrying groceries. Initially, the wound was slow to heal, but two days prior to the visit, it began showing purulent drainage after a part of the skin came off in the shower.  He saw Dr. Kennyth on 01/20/2024.  He was started on Keflex  500 mg four times a day and topical mupirocin . The wound is healing better now, with no more purulent drainage, and he continues to apply mupirocin  and/or Vaseline with.  Area has become much less tender and surrounding tissues are no longer red the wound has also decreased in size  He has a history of diabetes, which can complicate wound healing. He is consistent with his metformin  500 mg once a day, and his A1c has been under 7, most recently at 6.8, down from 7.2.  He also has a history of atrial fibrillation, which is rate controlled with diltiazem  240 mg extended release and metoprolol  75 mg extended release. He is anticoagulated with Coumadin .  Additionally, he has coronary artery disease and is on atorvastatin  40 mg for cholesterol management, with his LDL at 170. He is not on aspirin  due to Coumadin  use.  His blood pressure is well controlled with metoprolol  75 mg extended release, diltiazem  240 mg extended release, and Lasix  20 mg typically two days a week.  The patient reports that the wound is less tender and that there is no current drainage.         Past Medical History-  Patient Active Problem List   Diagnosis Date Noted   Diabetes mellitus without complication (HCC) 06/14/2023    Priority: High   Complete heart block (HCC) 04/02/2019    Priority: High   CAD, NATIVE VESSEL 09/27/2008    Priority: High   PACEMAKER, PERMANENT 03/11/2007    Priority: High   Atrial fibrillation (HCC) 02/11/2007    Priority: High   Benign paroxysmal positional vertigo of right ear 09/24/2019    Priority: Medium    Status post total right knee replacement 08/11/2019    Priority: Medium    History of adenomatous polyp of colon 09/29/2014    Priority: Medium    BPH associated with nocturia 09/09/2014    Priority: Medium    Hyperlipidemia associated with type 2 diabetes mellitus (HCC) 03/10/2008    Priority: Medium    Essential hypertension 12/02/2006    Priority: Medium    Aortic atherosclerosis (HCC) 10/28/2018    Priority: Low   Long term (current) use of anticoagulants 02/18/2017    Priority: Low   Encounter for therapeutic drug monitoring 09/24/2013    Priority: Low   History of thrombocytopenia 06/17/2011    Priority: Low   Hx of skin cancer, basal cell 04/07/2008    Priority: Low   Actinic keratosis 04/07/2008    Priority: Low   Allergic rhinitis 12/02/2006    Priority: Low   GERD 12/02/2006  Priority: Low   Leg swelling 12/02/2022   Benign microscopic hematuria 10/02/2018    Medications- reviewed and updated Current Outpatient Medications  Medication Sig Dispense Refill   acetaminophen  (TYLENOL ) 325 MG tablet Take 325-650 mg by mouth every 6 (six) hours as needed (pain).     albuterol  (VENTOLIN  HFA) 108 (90 Base) MCG/ACT inhaler Inhale 2 puffs into the lungs every 6 (six) hours as needed for wheezing or shortness of breath (With acute illness. no asthma or COPD history). 1 each 0   atorvastatin  (LIPITOR) 40 MG tablet TAKE 1 TABLET EVERY DAY AT 6PM 90 tablet 3   brimonidine (ALPHAGAN) 0.2 % ophthalmic solution Place 1  drop into both eyes in the morning and at bedtime.     diltiazem  (CARDIZEM  CD) 240 MG 24 hr capsule TAKE 1 CAPSULE EVERY DAY 90 capsule 3   finasteride  (PROSCAR ) 5 MG tablet Take 1 tablet (5 mg total) by mouth daily. 90 tablet 3   fluticasone  (FLONASE ) 50 MCG/ACT nasal spray Place 2 sprays into both nostrils daily. 48 g 3   furosemide  (LASIX ) 20 MG tablet TAKE 1 TABLET ON TUES, FRI. TAKE 1 TAB AS NEEDED FOR WEIGHT GAIN OF 3 POUNDS OR MORE ON SAT, SUN, MON, AND WED AS DIRECTED 30 tablet 3   metFORMIN  (GLUCOPHAGE ) 500 MG tablet Take 1 tablet (500 mg total) by mouth daily with breakfast. 90 tablet 3   metoprolol  succinate (TOPROL -XL) 50 MG 24 hr tablet TAKE 1 AND 1/2 TABLETS EVERY DAY. TAKE WITH OR IMMEDIATELY FOLLOWING A MEAL. 135 tablet 3   Multiple Vitamins-Minerals (MULTIVITAMIN & MINERAL PO) Take 1 tablet by mouth every other day.      mupirocin  ointment (BACTROBAN ) 2 % Apply 1 Application topically 2 (two) times daily. 60 g 0   nitroGLYCERIN  (NITROSTAT ) 0.4 MG SL tablet Place 1 tablet (0.4 mg total) under the tongue every 5 (five) minutes as needed for chest pain. 75 tablet 3   tamsulosin  (FLOMAX ) 0.4 MG CAPS capsule TAKE 2 CAPSULES EVERY DAY 180 capsule 3   warfarin (COUMADIN ) 2.5 MG tablet TAKE 1 TABLET BY MOUTH DAILY EXCEPT TAKE 1 1/2 TABLETS ON MONDAYS OR AS DIRECTED BY ANTICOAGULATION CLINIC 115 tablet 1   No current facility-administered medications for this visit.     Objective:  BP (!) 118/58 (BP Location: Left Arm, Patient Position: Sitting, Cuff Size: Normal)   Pulse 95   Temp 98 F (36.7 C) (Temporal)   Ht 5' 7 (1.702 m)   Wt 198 lb 9.6 oz (90.1 kg)   SpO2 (!) 77%   BMI 31.11 kg/m  Gen: NAD, resting comfortably CV: Regular heart rate no murmurs rubs or gallops Lungs: CTAB no crackles, wheeze, rhonchi  Ext: Trace edema, healing wound on right lower extremity down to 8 cm in length from 10 cm with healthy granulation tissue-slightly moist from recent mupirocin  application.   I uploaded a photo but appears did not processed-not available in our system at present.  No purulent drainage as per prior photo    Assessment and Plan      # Right lower leg wound with cellulitis, healing The right lower leg wound, initially sustained from a fall, showed signs of cellulitis with surrounding erythema and purulent drainage. After treatment with Keflex  and topical mupirocin , the wound has significantly improved with reduced redness and tenderness. The wound size has decreased from ten centimeters to eight centimeters, indicating good healing progress. No further purulent drainage is noted, and the wound is healing  well with healthy granulation tissue. - Continue applying mupirocin  or Vaseline to keep the wound moist. - Cover the wound when going out to prevent injury. - Avoid getting the wound sandy or dirty, especially at the beach with upcoming trip planned. - Follow up if symptoms of infection recur.  # Type 2 diabetes mellitus, well controlled Type 2 diabetes is well controlled with an A1c of 6.8, down from 7.2. He is consistent with metformin  therapy, which is effectively managing his blood glucose levels. - Continue metformin  500 mg once daily.  # Atrial fibrillation, rate controlled Atrial fibrillation is well managed with diltiazem  and metoprolol  for rate control, and anticoagulation with Coumadin . No issues reported with current management. - Continue diltiazem  240 mg extended release. - Continue metoprolol  75 mg extended release. - Continue Coumadin  for anticoagulation.  # Hypertension, controlled Hypertension is well controlled with metoprolol , diltiazem , and furosemide . Blood pressure management is effective with the current regimen. - Continue metoprolol  75 mg extended release. - Continue diltiazem  240 mg extended release. - Continue furosemide  20 mg twice a week.  # Coronary artery disease Coronary artery disease is managed with atorvastatin  for cholesterol  control. LDL levels are well controlled.  No reported chest pain shortness of breath Lab Results  Component Value Date   CHOL 87 11/26/2023   HDL 34.10 (L) 11/26/2023   LDLCALC 32 11/26/2023   LDLDIRECT 35.0 06/20/2022   TRIG 105.0 11/26/2023   CHOLHDL 3 11/26/2023    - Continue atorvastatin  40 mg daily.    Recommended follow up: Return for next already scheduled visit or sooner if needed. Future Appointments  Date Time Provider Department Center  02/27/2024  7:20 AM [facility] DEVICE REMOTES [facility]  03/11/2024 10:30 AM [facility] COUMADIN  CLINIC [facility]  03/30/2024  7:20 AM [facility] DEVICE REMOTES [facility]  04/30/2024  7:20 AM [facility] DEVICE REMOTES [facility]  06/01/2024  7:20 AM [facility] DEVICE REMOTES [facility]  06/08/2024 10:20 AM Katrinka Garnette KIDD, MD [facility]  07/02/2024  7:20 AM [facility] DEVICE REMOTES [facility]  08/03/2024  7:20 AM [facility] DEVICE REMOTES [facility]  09/03/2024  7:20 AM [facility] DEVICE REMOTES [facility]  11/24/2024 11:20 AM [facility] ANNUAL WELLNESS VISIT 1 [facility] [place]    Lab/Order associations:   ICD-10-CM   1. Cellulitis of right lower extremity  L03.115     2. Essential hypertension  I10     3. Paroxysmal atrial fibrillation (HCC)  I48.0     4. Diabetes mellitus without complication (HCC)  E11.9     5. Atherosclerosis of native coronary artery of native heart without angina pectoris  I25.10       No orders of the defined types were placed in this encounter.   Return precautions advised.  Garnette Katrinka, MD

## 2024-02-04 NOTE — Patient Instructions (Addendum)
 Thrilled you have improved- great job on wound care and taking antibiotics. Keep wound moist as continues to heal. Follow up if symptoms recur.   Biggest thing is avoiding falls! Have to be careful- carry less if need be   Recommended follow up: Return for next already scheduled visit or sooner if needed.

## 2024-02-19 NOTE — Progress Notes (Signed)
 Remote PPM Transmission

## 2024-02-27 ENCOUNTER — Encounter

## 2024-02-27 LAB — CUP PACEART REMOTE DEVICE CHECK
Battery Impedance: 5800 Ohm
Battery Remaining Longevity: 5 mo
Battery Voltage: 2.65 V
Brady Statistic RV Percent Paced: 98 %
Date Time Interrogation Session: 20250925080449
Implantable Lead Connection Status: 753985
Implantable Lead Connection Status: 753985
Implantable Lead Implant Date: 20081007
Implantable Lead Implant Date: 20081007
Implantable Lead Location: 753859
Implantable Lead Location: 753860
Implantable Lead Model: 5076
Implantable Lead Model: 5076
Implantable Pulse Generator Implant Date: 20160714
Lead Channel Impedance Value: 0 Ohm
Lead Channel Impedance Value: 484 Ohm
Lead Channel Pacing Threshold Amplitude: 0.625 V
Lead Channel Pacing Threshold Pulse Width: 0.4 ms
Lead Channel Setting Pacing Amplitude: 2 V
Lead Channel Setting Pacing Pulse Width: 0.4 ms
Lead Channel Setting Sensing Sensitivity: 1.4 mV
Zone Setting Status: 755011

## 2024-03-01 ENCOUNTER — Ambulatory Visit: Payer: Self-pay | Admitting: Internal Medicine

## 2024-03-11 ENCOUNTER — Ambulatory Visit

## 2024-03-11 DIAGNOSIS — Z7901 Long term (current) use of anticoagulants: Secondary | ICD-10-CM | POA: Diagnosis not present

## 2024-03-11 LAB — POCT INR: INR: 2.4 (ref 2.0–3.0)

## 2024-03-11 NOTE — Progress Notes (Signed)
 I have reviewed and agree with note, evaluation, plan.   Garnette Lukes, MD

## 2024-03-11 NOTE — Patient Instructions (Addendum)
 Pre visit review using our clinic review tool, if applicable. No additional management support is needed unless otherwise documented below in the visit note.  Continue 1 tablet daily except take 1 1/2 tablets on Monday. Recheck in 6 weeks.

## 2024-03-11 NOTE — Progress Notes (Signed)
 Pt requested this nurse contact [facility]  to check on warfarin refill because he only has 1 week left.   Contacted pharmacy and they report it shipped on 10/1 and checking the tracking number it should arrive today.   Advised pt of shipment and if any problems to contact the coumadin  clinic. Pt verbalized understanding.

## 2024-03-11 NOTE — Progress Notes (Signed)
 Continue 1 tablet daily except take 1 1/2 tablets on Monday. Recheck in 6 weeks.

## 2024-03-26 DIAGNOSIS — H04123 Dry eye syndrome of bilateral lacrimal glands: Secondary | ICD-10-CM | POA: Diagnosis not present

## 2024-03-26 DIAGNOSIS — Z961 Presence of intraocular lens: Secondary | ICD-10-CM | POA: Diagnosis not present

## 2024-03-26 DIAGNOSIS — H4031X2 Glaucoma secondary to eye trauma, right eye, moderate stage: Secondary | ICD-10-CM | POA: Diagnosis not present

## 2024-03-30 ENCOUNTER — Encounter

## 2024-03-30 ENCOUNTER — Ambulatory Visit: Attending: Internal Medicine

## 2024-03-30 LAB — CUP PACEART REMOTE DEVICE CHECK
Battery Impedance: 5708 Ohm
Battery Remaining Longevity: 6 mo
Battery Voltage: 2.68 V
Brady Statistic RV Percent Paced: 98 %
Date Time Interrogation Session: 20251027141642
Implantable Lead Connection Status: 753985
Implantable Lead Connection Status: 753985
Implantable Lead Implant Date: 20081007
Implantable Lead Implant Date: 20081007
Implantable Lead Location: 753859
Implantable Lead Location: 753860
Implantable Lead Model: 5076
Implantable Lead Model: 5076
Implantable Pulse Generator Implant Date: 20160714
Lead Channel Impedance Value: 0 Ohm
Lead Channel Impedance Value: 484 Ohm
Lead Channel Pacing Threshold Amplitude: 0.625 V
Lead Channel Pacing Threshold Pulse Width: 0.4 ms
Lead Channel Setting Pacing Amplitude: 2 V
Lead Channel Setting Pacing Pulse Width: 0.4 ms
Lead Channel Setting Sensing Sensitivity: 1.4 mV
Zone Setting Status: 755011

## 2024-04-02 ENCOUNTER — Ambulatory Visit: Payer: Self-pay | Admitting: Internal Medicine

## 2024-04-22 ENCOUNTER — Ambulatory Visit (INDEPENDENT_AMBULATORY_CARE_PROVIDER_SITE_OTHER)

## 2024-04-22 DIAGNOSIS — Z23 Encounter for immunization: Secondary | ICD-10-CM

## 2024-04-22 DIAGNOSIS — Z7901 Long term (current) use of anticoagulants: Secondary | ICD-10-CM | POA: Diagnosis not present

## 2024-04-22 LAB — POCT INR: INR: 1.9 — AB (ref 2.0–3.0)

## 2024-04-22 NOTE — Progress Notes (Signed)
 I have reviewed and agree with note, evaluation, plan.   Garnette Lukes, MD

## 2024-04-22 NOTE — Patient Instructions (Addendum)
 Pre visit review using our clinic review tool, if applicable. No additional management support is needed unless otherwise documented below in the visit note.  Increase dose today to take 1 1/2 tablets and then continue 1 tablet daily except take 1 1/2 tablets on Monday. Recheck in 7 weeks per pt request at appointment with Dr. Katrinka.

## 2024-04-22 NOTE — Progress Notes (Signed)
 Increase dose today to take 1 1/2 tablets and then continue 1 tablet daily except take 1 1/2 tablets on Monday. Recheck in 7 weeks per pt request at appointment with Dr. Katrinka.   Sent PCP a msg requesting INR check at apt on 1/5.  Pt requested high dose flu vaccine. Administered vaccine. Pt tolerated well.

## 2024-04-30 ENCOUNTER — Ambulatory Visit: Attending: Internal Medicine

## 2024-04-30 ENCOUNTER — Encounter

## 2024-04-30 DIAGNOSIS — I442 Atrioventricular block, complete: Secondary | ICD-10-CM | POA: Diagnosis not present

## 2024-05-02 LAB — CUP PACEART REMOTE DEVICE CHECK
Battery Impedance: 5749 Ohm
Battery Remaining Longevity: 6 mo
Battery Voltage: 2.67 V
Brady Statistic RV Percent Paced: 98 %
Date Time Interrogation Session: 20251127091256
Implantable Lead Connection Status: 753985
Implantable Lead Connection Status: 753985
Implantable Lead Implant Date: 20081007
Implantable Lead Implant Date: 20081007
Implantable Lead Location: 753859
Implantable Lead Location: 753860
Implantable Lead Model: 5076
Implantable Lead Model: 5076
Implantable Pulse Generator Implant Date: 20160714
Lead Channel Impedance Value: 0 Ohm
Lead Channel Impedance Value: 482 Ohm
Lead Channel Pacing Threshold Amplitude: 0.625 V
Lead Channel Pacing Threshold Pulse Width: 0.4 ms
Lead Channel Setting Pacing Amplitude: 2 V
Lead Channel Setting Pacing Pulse Width: 0.4 ms
Lead Channel Setting Sensing Sensitivity: 1.4 mV
Zone Setting Status: 755011

## 2024-05-06 NOTE — Progress Notes (Signed)
 Remote PPM Transmission

## 2024-05-07 ENCOUNTER — Ambulatory Visit: Payer: Self-pay | Admitting: Internal Medicine

## 2024-05-10 DIAGNOSIS — S0121XA Laceration without foreign body of nose, initial encounter: Secondary | ICD-10-CM | POA: Diagnosis not present

## 2024-05-10 DIAGNOSIS — Z79899 Other long term (current) drug therapy: Secondary | ICD-10-CM | POA: Diagnosis not present

## 2024-05-10 DIAGNOSIS — S0990XA Unspecified injury of head, initial encounter: Secondary | ICD-10-CM | POA: Diagnosis not present

## 2024-05-10 DIAGNOSIS — Z7901 Long term (current) use of anticoagulants: Secondary | ICD-10-CM | POA: Diagnosis not present

## 2024-05-10 DIAGNOSIS — S61411A Laceration without foreign body of right hand, initial encounter: Secondary | ICD-10-CM | POA: Diagnosis not present

## 2024-05-10 LAB — POCT INR: INR: 1.9 — AB (ref 2.0–3.0)

## 2024-05-11 ENCOUNTER — Ambulatory Visit (INDEPENDENT_AMBULATORY_CARE_PROVIDER_SITE_OTHER)

## 2024-05-11 ENCOUNTER — Ambulatory Visit: Payer: Self-pay

## 2024-05-11 DIAGNOSIS — Z7901 Long term (current) use of anticoagulants: Secondary | ICD-10-CM
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
## 2024-05-11 NOTE — Patient Instructions (Addendum)
 Pre visit review using our clinic review tool, if applicable. No additional management support is needed unless otherwise documented below in the visit note.  Increase dose today to take 2 tablets and then change weekly dose to take 1 tablet daily except take 1 1/2 tablets on Monday and Thursday. Recheck in 5 weeks per pt request at appointment with Dr. Katrinka.  Sent PCP a msg requesting INR check at apt on 1/5.
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
## 2024-05-11 NOTE — Telephone Encounter (Signed)
 If he is having severe pain then recommend he go to the Emergency Department or urgent care today for X-rays. Do not recommend he wait until tomorrow.

## 2024-05-11 NOTE — Progress Notes (Signed)
 Pt called to report he was in ER on 12/7 for fall. Pt hit head and obtained hand and nose laceration with closed head injury. No intracranial hemorrhage. Pt denies any s/s of bleeding or abnormal bruising. Advised if any s/s to go to ER. Pt verbalized understanding. Pt reports doing well today. INR tested in ER yesterday was 1.9 Advised pt to change dosing. Pt verbalized understanding.  Increase dose today to take 2 tablets and then change weekly dose to take 1 tablet daily except take 1 1/2 tablets on Monday and Thursday. Recheck in 5 weeks per pt request at appointment with Dr. Katrinka.  Sent PCP a msg requesting INR check at apt on 1/5. Pt was advised this nurse will f/u with him after receiving result obtained by PCP apt on 1/5.

## 2024-05-12 ENCOUNTER — Ambulatory Visit: Admitting: Family Medicine

## 2024-05-12 ENCOUNTER — Ambulatory Visit (INDEPENDENT_AMBULATORY_CARE_PROVIDER_SITE_OTHER)

## 2024-05-12 ENCOUNTER — Encounter: Payer: Self-pay | Admitting: Family Medicine

## 2024-05-12 VITALS — BP 102/56 | HR 67 | Temp 97.5°F | Ht 67.0 in | Wt 202.2 lb

## 2024-05-12 DIAGNOSIS — M25521 Pain in right elbow: Secondary | ICD-10-CM | POA: Diagnosis not present

## 2024-05-12 MED ORDER — UNIVERSAL ARM SLING MISC: 0 refills | Status: DC

## 2024-05-12 NOTE — Telephone Encounter (Signed)
 Patient was seen and treated in office today.
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
## 2024-05-12 NOTE — Patient Instructions (Addendum)
 It was very nice to see you today!  VISIT SUMMARY: You visited us  today for a follow-up after your recent fall. We addressed your elbow pain, checked your nasal and hand lacerations, and reviewed your knee abrasions and skin tear.  YOUR PLAN: RIGHT ELBOW CONTUSION: You have significant pain and swelling in your right elbow, which may indicate a fracture or dislocation. -We have ordered an x-ray of your right elbow to check for any fractures or dislocations. -We will refer you to an orthopedic specialist.  NASAL AND RIGHT HAND LACERATIONS: You have stitches on your nose and hand from the fall. -We applied saline to the gauze on your nose to help with removal. -We applied Steri-Strips to your right hand laceration.  BILATERAL KNEE ABRASIONS: You have abrasions on both knees from the fall. -Keep the abrasions clean and dry. Apply antibiotic ointment as needed.  CELLULITIS/SKIN TEAR: You have a skin tear on your right arm. -Keep the area clean and covered with a sterile bandage.  Return if symptoms worsen or fail to improve.   Take care, Dr Kennyth  PLEASE NOTE:  If you had any lab tests, please let us  know if you have not heard back within a few days. You may see your results on mychart before we have a chance to review them but we will give you a call once they are reviewed by us .   If we ordered any referrals today, please let us  know if you have not heard from their office within the next week.   If you had any urgent prescriptions sent in today, please check with the pharmacy within an hour of our visit to make sure the prescription was transmitted appropriately.   Please try these tips to maintain a healthy lifestyle:  Eat at least 3 REAL meals and 1-2 snacks per day.  Aim for no more than 5 hours between eating.  If you eat breakfast, please do so within one hour of getting up.   Each meal should contain half fruits/vegetables, one quarter protein, and one quarter carbs (no  bigger than a computer mouse)  Cut down on sweet beverages. This includes juice, soda, and sweet tea.   Drink at least 1 glass of water  with each meal and aim for at least 8 glasses per day  Exercise at least 150 minutes every week.

## 2024-05-13 ENCOUNTER — Other Ambulatory Visit: Payer: Self-pay | Admitting: Family Medicine

## 2024-05-20 ENCOUNTER — Ambulatory Visit: Admitting: Orthopaedic Surgery

## 2024-05-20 ENCOUNTER — Other Ambulatory Visit: Payer: Self-pay

## 2024-05-20 DIAGNOSIS — M25561 Pain in right knee: Secondary | ICD-10-CM

## 2024-05-20 DIAGNOSIS — M25521 Pain in right elbow: Secondary | ICD-10-CM

## 2024-05-20 NOTE — Progress Notes (Signed)
 Office Visit Note   Patient: Shawn [name]           Date of Birth: 1934/12/25           MRN: [id number] Visit Date: 05/20/2024              Requested by: Kennyth Worth HERO, MD [address] [place],  [place] [address] PCP: Katrinka Garnette KIDD, MD   Assessment & Plan: Visit Diagnoses:  1. Acute pain of right knee   2. Pain in right elbow     Plan: History of Present Illness Shawn [name] is an 89 year old male who presents with right elbow and knee pain following a fall.  He developed right elbow and right knee pain after a fall 11 days ago. Elbow X-rays on December 9 showed no acute abnormality, only degenerative changes. He has a long-standing inability to fully extend the right elbow from a remote injury.  Regarding the right knee, pain was severe enough on Monday to prevent him from getting up, but has since improved and is now sore. He notes leg edema and fluid in the knee. He is on blood thinners and has a prior knee replacement, which he feels is related to his current knee symptoms.  Physical Exam EXTREMITIES: Right knee exam shows significant edema in legs.  Small effusion in the knee.  Healed abrasions across the front of the knee.  Fully healed surgical scar.  No signs of infection. Right elbow exam shows baseline range of motion and function.  Results Radiology Right elbow x-ray (05/12/2024): No acute abnormality; degenerative changes  Assessment and Plan Right knee pain and effusion after trauma Right knee pain and effusion post-trauma with edema and fluid likely from hematoma, no implant damage.  Right elbow osteoarthritis Chronic right elbow osteoarthritis with limited range of motion, no acute damage from recent fall.  Follow-Up Instructions: No follow-ups on file.   Orders:  Orders Placed This Encounter  Procedures   XR KNEE 3 VIEW RIGHT   No orders of the defined types were placed in this encounter.     Procedures: No procedures  performed   Clinical Data: No additional findings.   Subjective: Chief Complaint  Patient presents with   Right Elbow - Pain   Right Knee - Pain    HPI  Review of Systems  Constitutional: Negative.   HENT: Negative.    Eyes: Negative.   Respiratory: Negative.    Cardiovascular: Negative.   Gastrointestinal: Negative.   Endocrine: Negative.   Genitourinary: Negative.   Skin: Negative.   Allergic/Immunologic: Negative.   Neurological: Negative.   Hematological: Negative.   Psychiatric/Behavioral: Negative.    All other systems reviewed and are negative.    Objective: Vital Signs: There were no vitals taken for this visit.  Physical Exam Vitals and nursing note reviewed.  Constitutional:      Appearance: He is well-developed.  HENT:     Head: Normocephalic and atraumatic.  Eyes:     Pupils: Pupils are equal, round, and reactive to light.  Pulmonary:     Effort: Pulmonary effort is normal.  Abdominal:     Palpations: Abdomen is soft.  Musculoskeletal:        General: Normal range of motion.     Cervical back: Neck supple.  Skin:    General: Skin is warm.  Neurological:     Mental Status: He is alert and oriented to person, place, and time.  Psychiatric:  Behavior: Behavior normal.        Thought Content: Thought content normal.        Judgment: Judgment normal.     Ortho Exam  Specialty Comments:  No specialty comments available.  Imaging: XR KNEE 3 VIEW RIGHT Result Date: 05/20/2024 Stable total knee replacement without complication.    PMFS History: Patient Active Problem List   Diagnosis Date Noted   Diabetes mellitus without complication (HCC) 06/14/2023   Leg swelling 12/02/2022   Benign paroxysmal positional vertigo of right ear 09/24/2019   Status post total right knee replacement 08/11/2019   Complete heart block (HCC) 04/02/2019   Aortic atherosclerosis 10/28/2018   Benign microscopic hematuria 10/02/2018   Long term  (current) use of anticoagulants 02/18/2017   History of adenomatous polyp of colon 09/29/2014   BPH associated with nocturia 09/09/2014   Encounter for therapeutic drug monitoring 09/24/2013   History of thrombocytopenia 06/17/2011   CAD, NATIVE VESSEL 09/27/2008   Hx of skin cancer, basal cell 04/07/2008   Actinic keratosis 04/07/2008   Hyperlipidemia associated with type 2 diabetes mellitus (HCC) 03/10/2008   PACEMAKER, PERMANENT 03/11/2007   Atrial fibrillation (HCC) 02/11/2007   Essential hypertension 12/02/2006   Allergic rhinitis 12/02/2006   GERD 12/02/2006   Past Medical History:  Diagnosis Date   Actinic keratosis    Adenomatous colon polyp    ALLERGIC RHINITIS    Arthritis    CAD, NATIVE VESSEL    a. DES x2 to RCA 2004 b. repeat PTCA to RCA 2008 c. PCI to RCA 2012 non-DES    Cellulitis and abscess of trunk    Diabetes mellitus without complication (HCC)    Diverticulosis    Dysrhythmia    GERD (gastroesophageal reflux disease)    HYPERLIPIDEMIA    HYPERTENSION    OTH MALIG NEOPLASM SKIN OTH\T\UNSPEC PARTS FACE    Permanent atrial fibrillation (HCC)    PONV (postoperative nausea and vomiting)    Presence of permanent cardiac pacemaker    Skin cancer    Tachy-brady syndrome (HCC)    a. s/p MDT dual chamber pacemaker implant complicated by pericardial effusion and micro-perforatation s/p lead revisions    Family History  Problem Relation Age of Onset   Ovarian cancer Mother    Heart attack Father    Colon cancer Father 64       deceased 42   Diabetes Brother        x 2   Heart disease Brother        x 3    Past Surgical History:  Procedure Laterality Date   BACK SURGERY  2000   for bulging discs   CATARACT EXTRACTION W/ INTRAOCULAR LENS  IMPLANT, BILATERAL  2012   CORONARY ANGIOPLASTY WITH STENT PLACEMENT  2008   CORONARY ANGIOPLASTY WITH STENT PLACEMENT  05/14/11   CYSTECTOMY     from right side of face.   EP IMPLANTABLE DEVICE N/A 12/16/2014    Procedure: PPM Generator Changeout;  Surgeon: Danelle LELON Birmingham, MD;  [facility] INVASIVE CV LAB;  Service: Cardiovascular;  Laterality: N/A;   LEAD REVISION  2008   11 days post initial implant, RA and RV lead revisions for micro-perforation and pericardial effusion by Dr Birmingham   LEFT HEART CATHETERIZATION WITH CORONARY ANGIOGRAM N/A 05/14/2011   Procedure: LEFT HEART CATHETERIZATION WITH CORONARY ANGIOGRAM;  Surgeon: Debby JONETTA Como, MD;  Location: [facility] CATH LAB;  Service: Cardiovascular;  Laterality: N/A;   PACEMAKER INSERTION  2008  MDT dual chamber pacemaker implanted by Dr Lavona   TOTAL KNEE ARTHROPLASTY Right 08/11/2019   Procedure: TOTAL KNEE ARTHROPLASTY;  Surgeon: Ernie Cough, MD;  Location: [facility] ORS;  Service: Orthopedics;  Laterality: Right;  70 mins   Social History   Occupational History   Occupation: retired    Comment: [employer]   Tobacco Use   Smoking status: Never    Passive exposure: Past   Smokeless tobacco: Never  Vaping Use   Vaping status: Never Used  Substance and Sexual Activity   Alcohol use: No    Comment: 05/14/11 last occasional drink was in 1990's   Drug use: No   Sexual activity: Yes

## 2024-05-31 ENCOUNTER — Ambulatory Visit: Attending: Internal Medicine

## 2024-06-01 ENCOUNTER — Ambulatory Visit: Admitting: Family Medicine

## 2024-06-01 ENCOUNTER — Encounter

## 2024-06-01 LAB — CUP PACEART REMOTE DEVICE CHECK
Battery Impedance: 5808 Ohm
Battery Remaining Longevity: 5 mo
Battery Voltage: 2.67 V
Brady Statistic RV Percent Paced: 98 %
Date Time Interrogation Session: 20251229085216
Implantable Lead Connection Status: 753985
Implantable Lead Connection Status: 753985
Implantable Lead Implant Date: 20081007
Implantable Lead Implant Date: 20081007
Implantable Lead Location: 753859
Implantable Lead Location: 753860
Implantable Lead Model: 5076
Implantable Lead Model: 5076
Implantable Pulse Generator Implant Date: 20160714
Lead Channel Impedance Value: 0 Ohm
Lead Channel Impedance Value: 478 Ohm
Lead Channel Pacing Threshold Amplitude: 0.75 V
Lead Channel Pacing Threshold Pulse Width: 0.4 ms
Lead Channel Setting Pacing Amplitude: 2 V
Lead Channel Setting Pacing Pulse Width: 0.4 ms
Lead Channel Setting Sensing Sensitivity: 1.4 mV
Zone Setting Status: 755011

## 2024-06-02 ENCOUNTER — Ambulatory Visit: Payer: Self-pay | Admitting: Internal Medicine

## 2024-06-02 ENCOUNTER — Ambulatory Visit: Admitting: Family Medicine

## 2024-06-03 ENCOUNTER — Other Ambulatory Visit: Payer: Self-pay | Admitting: Family Medicine

## 2024-06-08 ENCOUNTER — Ambulatory Visit (INDEPENDENT_AMBULATORY_CARE_PROVIDER_SITE_OTHER): Admitting: Family Medicine

## 2024-06-08 ENCOUNTER — Encounter: Payer: Self-pay | Admitting: Family Medicine

## 2024-06-08 ENCOUNTER — Ambulatory Visit: Payer: Self-pay | Admitting: Family Medicine

## 2024-06-08 VITALS — BP 118/68 | HR 86 | Temp 97.0°F | Ht 67.0 in | Wt 202.0 lb

## 2024-06-08 DIAGNOSIS — E1169 Type 2 diabetes mellitus with other specified complication: Secondary | ICD-10-CM | POA: Diagnosis not present

## 2024-06-08 DIAGNOSIS — Z7901 Long term (current) use of anticoagulants: Secondary | ICD-10-CM

## 2024-06-08 DIAGNOSIS — I480 Paroxysmal atrial fibrillation: Secondary | ICD-10-CM | POA: Diagnosis not present

## 2024-06-08 DIAGNOSIS — I10 Essential (primary) hypertension: Secondary | ICD-10-CM | POA: Diagnosis not present

## 2024-06-08 DIAGNOSIS — Z7984 Long term (current) use of oral hypoglycemic drugs: Secondary | ICD-10-CM | POA: Diagnosis not present

## 2024-06-08 DIAGNOSIS — E785 Hyperlipidemia, unspecified: Secondary | ICD-10-CM

## 2024-06-08 LAB — CBC WITH DIFFERENTIAL/PLATELET
Basophils Absolute: 0.1 K/uL (ref 0.0–0.1)
Basophils Relative: 0.7 % (ref 0.0–3.0)
Eosinophils Absolute: 0.2 K/uL (ref 0.0–0.7)
Eosinophils Relative: 2.9 % (ref 0.0–5.0)
HCT: 38.3 % — ABNORMAL LOW (ref 39.0–52.0)
Hemoglobin: 12.8 g/dL — ABNORMAL LOW (ref 13.0–17.0)
Lymphocytes Relative: 22.3 % (ref 12.0–46.0)
Lymphs Abs: 1.7 K/uL (ref 0.7–4.0)
MCHC: 33.6 g/dL (ref 30.0–36.0)
MCV: 90.3 fl (ref 78.0–100.0)
Monocytes Absolute: 0.7 K/uL (ref 0.1–1.0)
Monocytes Relative: 9.8 % (ref 3.0–12.0)
Neutro Abs: 4.8 K/uL (ref 1.4–7.7)
Neutrophils Relative %: 64.3 % (ref 43.0–77.0)
Platelets: 157 K/uL (ref 150.0–400.0)
RBC: 4.24 Mil/uL (ref 4.22–5.81)
RDW: 14.1 % (ref 11.5–15.5)
WBC: 7.5 K/uL (ref 4.0–10.5)

## 2024-06-08 LAB — COMPREHENSIVE METABOLIC PANEL WITH GFR
ALT: 15 U/L (ref 3–53)
AST: 16 U/L (ref 5–37)
Albumin: 3.9 g/dL (ref 3.5–5.2)
Alkaline Phosphatase: 76 U/L (ref 39–117)
BUN: 16 mg/dL (ref 6–23)
CO2: 29 meq/L (ref 19–32)
Calcium: 9.6 mg/dL (ref 8.4–10.5)
Chloride: 104 meq/L (ref 96–112)
Creatinine, Ser: 1.04 mg/dL (ref 0.40–1.50)
GFR: 64.62 mL/min
Glucose, Bld: 89 mg/dL (ref 70–99)
Potassium: 4.1 meq/L (ref 3.5–5.1)
Sodium: 139 meq/L (ref 135–145)
Total Bilirubin: 0.8 mg/dL (ref 0.2–1.2)
Total Protein: 7.1 g/dL (ref 6.0–8.3)

## 2024-06-08 NOTE — Addendum Note (Signed)
 Addended by: Talia Hoheisel on: 06/08/2024 02:43 PM   Modules accepted: Orders

## 2024-06-08 NOTE — Telephone Encounter (Signed)
 Tried to contact pt again but no answer and VM is full.

## 2024-06-08 NOTE — Telephone Encounter (Signed)
 Pt had an apt with PCP today and requested to have a lab INR drawn with other labs during his apt. Other labs were drawn but the PT/INR was missed and not drawn.   Tried to contact pt to advised lab was not drawn and will need to go back to lab or make coumadin  clinic apt.  No answer and VM is full.

## 2024-06-08 NOTE — Progress Notes (Signed)
 " Phone [phone] In person visit   Subjective:   Shawn [name] is a 89 y.o. year old very pleasant male patient who presents for/with See problem oriented charting Chief Complaint  Patient presents with   Medical Management of Chronic Issues    6 month follow up; needs INR checked today;    Diabetes    Requesting diabetic eye exam from [facility];     Past Medical History-  Patient Active Problem List   Diagnosis Date Noted   Diabetes mellitus without complication (HCC) 06/14/2023    Priority: High   Complete heart block (HCC) 04/02/2019    Priority: High   CAD, NATIVE VESSEL 09/27/2008    Priority: High   PACEMAKER, PERMANENT 03/11/2007    Priority: High   Atrial fibrillation (HCC) 02/11/2007    Priority: High   Benign paroxysmal positional vertigo of right ear 09/24/2019    Priority: Medium    Status post total right knee replacement 08/11/2019    Priority: Medium    History of adenomatous polyp of colon 09/29/2014    Priority: Medium    BPH associated with nocturia 09/09/2014    Priority: Medium    Hyperlipidemia associated with type 2 diabetes mellitus (HCC) 03/10/2008    Priority: Medium    Essential hypertension 12/02/2006    Priority: Medium    Aortic atherosclerosis 10/28/2018    Priority: Low   Long term (current) use of anticoagulants 02/18/2017    Priority: Low   Encounter for therapeutic drug monitoring 09/24/2013    Priority: Low   History of thrombocytopenia 06/17/2011    Priority: Low   Hx of skin cancer, basal cell 04/07/2008    Priority: Low   Actinic keratosis 04/07/2008    Priority: Low   Allergic rhinitis 12/02/2006    Priority: Low   GERD 12/02/2006    Priority: Low   Leg swelling 12/02/2022   Benign microscopic hematuria 10/02/2018    Medications- reviewed and updated Current Outpatient Medications  Medication Sig Dispense Refill   acetaminophen  (TYLENOL ) 325 MG tablet Take 325-650 mg by mouth every 6 (six) hours as needed (pain).      albuterol  (VENTOLIN  HFA) 108 (90 Base) MCG/ACT inhaler Inhale 2 puffs into the lungs every 6 (six) hours as needed for wheezing or shortness of breath (With acute illness. no asthma or COPD history). 1 each 0   atorvastatin  (LIPITOR) 40 MG tablet TAKE 1 TABLET EVERY DAY AT 6PM 90 tablet 3   brimonidine (ALPHAGAN) 0.2 % ophthalmic solution Place 1 drop into both eyes in the morning and at bedtime.     diltiazem  (CARDIZEM  CD) 240 MG 24 hr capsule TAKE 1 CAPSULE EVERY DAY 90 capsule 3   finasteride  (PROSCAR ) 5 MG tablet TAKE 1 TABLET EVERY DAY 90 tablet 3   fluticasone  (FLONASE ) 50 MCG/ACT nasal spray Place 2 sprays into both nostrils daily. 48 g 3   furosemide  (LASIX ) 20 MG tablet TAKE 1 TABLET ON TUES, FRI. TAKE 1 TAB AS NEEDED FOR WEIGHT GAIN OF 3 POUNDS OR MORE ON SAT, SUN, MON, AND WED AS DIRECTED 30 tablet 3   metFORMIN  (GLUCOPHAGE ) 500 MG tablet Take 1 tablet (500 mg total) by mouth daily with breakfast. 90 tablet 3   metoprolol  succinate (TOPROL -XL) 50 MG 24 hr tablet TAKE 1 AND 1/2 TABLETS EVERY DAY. TAKE WITH OR IMMEDIATELY FOLLOWING A MEAL. 135 tablet 3   Multiple Vitamins-Minerals (MULTIVITAMIN & MINERAL PO) Take 1 tablet by mouth every other day.  mupirocin  ointment (BACTROBAN ) 2 % Apply 1 Application topically 2 (two) times daily. 60 g 0   nitroGLYCERIN  (NITROSTAT ) 0.4 MG SL tablet Place 1 tablet (0.4 mg total) under the tongue every 5 (five) minutes as needed for chest pain. 75 tablet 3   tamsulosin  (FLOMAX ) 0.4 MG CAPS capsule TAKE 2 CAPSULES EVERY DAY 180 capsule 3   warfarin (COUMADIN ) 2.5 MG tablet TAKE 1 TABLET BY MOUTH DAILY EXCEPT TAKE 1 1/2 TABLETS ON MONDAYS OR AS DIRECTED BY ANTICOAGULATION CLINIC 115 tablet 1   No current facility-administered medications for this visit.     Objective:  BP 118/68 (BP Location: Left Arm, Patient Position: Sitting, Cuff Size: Normal)   Pulse 86   Temp (!) 97 F (36.1 C) (Temporal)   Ht 5' 7 (1.702 m)   Wt 202 lb (91.6 kg)    SpO2 97%   BMI 31.64 kg/m  Gen: NAD, resting comfortably CV: RRR no murmurs rubs or gallops Lungs: CTAB no crackles, wheeze, rhonchi Ext: trace edema Skin: warm, dry Retained absorbable suture on face- offered to remove but he wants to monitor- was told would absorb    Assessment and Plan    # Fall with emergency department follow-up on 05/10/2024.  Largely reassuring CT head, CT facial bones, CT cervical spine after tripping and falling on a curb and sustaining laceration to the nose and knees with just abrasions.  Reports sutures absorbable and was told no removal. He was discharged home and no further falls.  We discussed importance of fall avoidance. Encouraged him to use his cane  # Diabetes-new diagnosis January 23, 2023 S: Medication: metformin  500 mg daily  Lab Results  Component Value Date   HGBA1C 6.8 (H) 11/26/2023   HGBA1C 7.2 (H) 06/14/2023   HGBA1C 7.0 (H) 01/23/2023  A/P: hopefully stable- update a1c today. Continue current meds for now   #CAD with history of stents #hyperlipidemia # Aortic atherosclerosis S: Medication: Atorvastatin  40Mg , Coumadin  (not on aspirin  as a result)  - no chest pain or shortness of breath  Lab Results  Component Value Date   CHOL 87 11/26/2023   HDL 34.10 (L) 11/26/2023   LDLCALC 32 11/26/2023   LDLDIRECT 35.0 06/20/2022   TRIG 105.0 11/26/2023   CHOLHDL 3 11/26/2023   A/P: cholesterol at goal- continue current medications  Coronary artery disease asymptomatic continue current medications   # Atrial fibrillation with pacemaker due to history of tachybradycardia and complete heart block syndrome-follows Dr. Waddell and Dr. Lavona S: Rate controlled with diltiazem  240 mg extended release, metoprolol  75 mg extended release Anticoagulated with Coumadin -follows with [facility] A/P: appropriately anticoagulated and rate controlled- continue current medicine    #hypertension S: medication: Metoprolol  75 mg extended release,  diltiazem  240 mg extended release, also on Lasix  twice a week to help with edemathrough Dr. Lavona BP Readings from Last 3 Encounters:  06/08/24 118/68  05/12/24 (!) 102/56  02/04/24 (!) 118/58  A/P: well controlled continue current medications   #BPH S: Medication: Finasteride  5 mg daily, tamsulosin  0.8 mg daily  A/P: reasonable control continue current medications    #Allergies/allergic rhinitis S: Medication: Flonase  regularly- no nosebleeds  A/P: doing well continue current medications    #Osteoarthritis-- right knee replacement march 2021. Did bother him some after fall but better now  Recommended follow up: Return in about 6 months (around 12/06/2024) for physical or sooner if needed.Schedule b4 you leave. Future Appointments  Date Time Provider Department Center  07/01/2024  7:20 AM  [facility] HVT DEVICE REMOTES [facility]  08/01/2024  7:20 AM [facility] HVT DEVICE REMOTES [facility]  09/01/2024  7:20 AM [facility] HVT DEVICE REMOTES [facility]  11/24/2024 11:20 AM [facility] ANNUAL WELLNESS VISIT 1 [facility] [place]    Lab/Order associations:   ICD-10-CM   1. Essential hypertension  I10 CBC with Differential/Platelet    Comprehensive metabolic panel with GFR    2. Hyperlipidemia associated with type 2 diabetes mellitus (HCC)  E11.69 Hemoglobin A1c   E78.5 CBC with Differential/Platelet    Comprehensive metabolic panel with GFR    3. Paroxysmal atrial fibrillation (HCC)  I48.0     4. Long term (current) use of anticoagulants  Z79.01 Protime-INR    5. Long term current use of oral hypoglycemic drug  Z79.84       No orders of the defined types were placed in this encounter.   Return precautions advised.  Garnette Lukes, MD  "

## 2024-06-08 NOTE — Patient Instructions (Addendum)
 Please stop by lab before you go If you have mychart- we will send your results within 3 business days of us  receiving them.  If you do not have mychart- we will call you about results within 5 business days of us  receiving them.  *please also note that you will see labs on mychart as soon as they post. I will later go in and write notes on them- will say notes from Dr. Katrinka   No changes today unless labs lead us  to make changes  Recommended follow up: Return in about 6 months (around 12/06/2024) for physical or sooner if needed.Schedule b4 you leave.

## 2024-06-09 DIAGNOSIS — D649 Anemia, unspecified: Secondary | ICD-10-CM

## 2024-06-09 NOTE — Telephone Encounter (Signed)
 Contacted pt and advised lab INR was not drawn. Apologized this was not drawn.  He requested to come to coumadin  clinic tomorrow at [facility]. Scheduled for tomorrow. Pt verbalized understanding.

## 2024-06-10 ENCOUNTER — Ambulatory Visit (INDEPENDENT_AMBULATORY_CARE_PROVIDER_SITE_OTHER)

## 2024-06-10 ENCOUNTER — Telehealth: Payer: Self-pay | Admitting: Family Medicine

## 2024-06-10 ENCOUNTER — Ambulatory Visit

## 2024-06-10 DIAGNOSIS — Z7901 Long term (current) use of anticoagulants: Secondary | ICD-10-CM

## 2024-06-10 LAB — POCT INR: INR: 2 (ref 2.0–3.0)

## 2024-06-10 NOTE — Patient Instructions (Addendum)
 Pre visit review using our clinic review tool, if applicable. No additional management support is needed unless otherwise documented below in the visit note.  Increase dose today to take 1 1/2 tablets and then change weekly dose to take 1 tablet daily except take 1 1/2 tablets on Monday, Thursday and Saturday. Recheck in 3 weeks.

## 2024-06-10 NOTE — Progress Notes (Signed)
 Indication: Afib Pt denies any changes but has been subtherapeutic for last several INR tests and is on the lowest end of his INR range today. Due to this there will be a small increase in weekly dosing to move INR closer to goal of 2.5. Will recheck in 3 weeks.  Increase dose today to take 1 1/2 tablets and then change weekly dose to take 1 tablet daily except take 1 1/2 tablets on Monday, Thursday and Saturday. Recheck in 3 weeks.

## 2024-06-10 NOTE — Telephone Encounter (Signed)
 Unable to LVM to schedule Lab only visit in 2 wks. VM is full.

## 2024-06-10 NOTE — Progress Notes (Signed)
 I have reviewed and agree with note, evaluation, plan.   Garnette Lukes, MD

## 2024-06-13 ENCOUNTER — Other Ambulatory Visit: Payer: Self-pay | Admitting: Family Medicine

## 2024-07-01 ENCOUNTER — Ambulatory Visit: Attending: Family Medicine

## 2024-07-01 ENCOUNTER — Ambulatory Visit

## 2024-07-01 ENCOUNTER — Other Ambulatory Visit

## 2024-07-01 DIAGNOSIS — E1169 Type 2 diabetes mellitus with other specified complication: Secondary | ICD-10-CM

## 2024-07-01 DIAGNOSIS — E785 Hyperlipidemia, unspecified: Secondary | ICD-10-CM

## 2024-07-01 DIAGNOSIS — Z7901 Long term (current) use of anticoagulants: Secondary | ICD-10-CM

## 2024-07-01 DIAGNOSIS — I10 Essential (primary) hypertension: Secondary | ICD-10-CM | POA: Diagnosis not present

## 2024-07-01 LAB — CBC WITH DIFFERENTIAL/PLATELET
Basophils Absolute: 0.1 10*3/uL (ref 0.0–0.1)
Basophils Relative: 0.7 % (ref 0.0–3.0)
Eosinophils Absolute: 0.3 10*3/uL (ref 0.0–0.7)
Eosinophils Relative: 3.1 % (ref 0.0–5.0)
HCT: 40.3 % (ref 39.0–52.0)
Hemoglobin: 13.5 g/dL (ref 13.0–17.0)
Lymphocytes Relative: 16.8 % (ref 12.0–46.0)
Lymphs Abs: 1.4 10*3/uL (ref 0.7–4.0)
MCHC: 33.4 g/dL (ref 30.0–36.0)
MCV: 91.1 fl (ref 78.0–100.0)
Monocytes Absolute: 0.6 10*3/uL (ref 0.1–1.0)
Monocytes Relative: 6.8 % (ref 3.0–12.0)
Neutro Abs: 5.9 10*3/uL (ref 1.4–7.7)
Neutrophils Relative %: 72.6 % (ref 43.0–77.0)
Platelets: 168 10*3/uL (ref 150.0–400.0)
RBC: 4.43 Mil/uL (ref 4.22–5.81)
RDW: 14.5 % (ref 11.5–15.5)
WBC: 8.2 10*3/uL (ref 4.0–10.5)

## 2024-07-01 LAB — PROTIME-INR
INR: 2.6 ratio — ABNORMAL HIGH (ref 0.8–1.0)
Prothrombin Time: 27.5 s — ABNORMAL HIGH (ref 9.6–13.1)

## 2024-07-01 LAB — COMPREHENSIVE METABOLIC PANEL WITH GFR
ALT: 15 U/L (ref 3–53)
AST: 14 U/L (ref 5–37)
Albumin: 4 g/dL (ref 3.5–5.2)
Alkaline Phosphatase: 81 U/L (ref 39–117)
BUN: 18 mg/dL (ref 6–23)
CO2: 31 meq/L (ref 19–32)
Calcium: 9.7 mg/dL (ref 8.4–10.5)
Chloride: 105 meq/L (ref 96–112)
Creatinine, Ser: 1.1 mg/dL (ref 0.40–1.50)
GFR: 60.38 mL/min
Glucose, Bld: 131 mg/dL — ABNORMAL HIGH (ref 70–99)
Potassium: 4.3 meq/L (ref 3.5–5.1)
Sodium: 141 meq/L (ref 135–145)
Total Bilirubin: 0.8 mg/dL (ref 0.2–1.2)
Total Protein: 7.5 g/dL (ref 6.0–8.3)

## 2024-07-01 LAB — POCT INR: INR: 2.7 (ref 2.0–3.0)

## 2024-07-01 LAB — HEMOGLOBIN A1C: Hgb A1c MFr Bld: 6.3 % (ref 4.6–6.5)

## 2024-07-01 MED ORDER — WARFARIN SODIUM 2.5 MG PO TABS: ORAL_TABLET | ORAL | 1 refills | Status: AC

## 2024-07-01 NOTE — Patient Instructions (Addendum)
 Pre visit review using our clinic review tool, if applicable. No additional management support is needed unless otherwise documented below in the visit note.  Continue 1 tablet daily except take 1 1/2 tablets on Monday, Thursday and Saturday. Recheck in 6 weeks.

## 2024-07-01 NOTE — Progress Notes (Signed)
 Indication: Afib Pt denies any changes but has been subtherapeutic for last several INR tests and was on the lowest end of his INR range at last check. Due to that there was a small increase in weekly dosing to move INR closer to goal of 2.5.  Continue 1 tablet daily except take 1 1/2 tablets on Monday, Thursday and Saturday. Recheck in 6 weeks. Review of chart revealed pt was to have f/u labs a few weeks after his labs on 1/9. Pt was not aware of this. Scheduled pt today for labs. Pt appreciative.  Pt is compliant with warfarin management and PCP apts.  Sent in refill of warfarin to requested pharmacy.

## 2024-07-01 NOTE — Telephone Encounter (Signed)
 Pt was in coumadin  clinic this morning. Review of chart revealed pt is to have f/u labs from 1/9. Advised pt the need for labs. Pt agreed. Pt was scheduled for labs today and had lab draw.

## 2024-07-01 NOTE — Progress Notes (Signed)
 I have reviewed the patient's encounter and agree with the documentation.  Worth HERO. Kennyth, MD 07/01/2024 9:56 AM

## 2024-07-02 ENCOUNTER — Encounter

## 2024-07-02 NOTE — Telephone Encounter (Signed)
 Katrinka Garnette KIDD, MD to [facility], [place], RN  (Selected Message)    07/02/24 12:11 PM Result Note Notes from Dr. Katrinka:  INR therapeutic at 2.6   Your CBC was completely normal (blood counts, infection fighting cells, platelets).  Your CMET was largely normal (kidney, liver, and electrolytes, blood sugar).   Your hemoglobin a1c is at goal of 7.0 or less at 6.3 today.     Tried to contact pt to go over lab results but no answer and VM is full. INR result was addressed yesterday when pt was in coumadin  clinic.   Will try to contact pt again at another time.

## 2024-07-03 ENCOUNTER — Telehealth: Payer: Self-pay | Admitting: Cardiovascular Disease

## 2024-07-03 NOTE — Telephone Encounter (Signed)
 Called re missed remote

## 2024-07-06 LAB — CUP PACEART REMOTE DEVICE CHECK
Battery Impedance: 5820 Ohm
Battery Remaining Longevity: 5 mo
Battery Voltage: 2.66 V
Brady Statistic RV Percent Paced: 98 %
Date Time Interrogation Session: 20260130122958
Implantable Lead Connection Status: 753985
Implantable Lead Connection Status: 753985
Implantable Lead Implant Date: 20081007
Implantable Lead Implant Date: 20081007
Implantable Lead Location: 753859
Implantable Lead Location: 753860
Implantable Lead Model: 5076
Implantable Lead Model: 5076
Implantable Pulse Generator Implant Date: 20160714
Lead Channel Impedance Value: 0 Ohm
Lead Channel Impedance Value: 478 Ohm
Lead Channel Pacing Threshold Amplitude: 0.625 V
Lead Channel Pacing Threshold Pulse Width: 0.4 ms
Lead Channel Setting Pacing Amplitude: 2 V
Lead Channel Setting Pacing Pulse Width: 0.4 ms
Lead Channel Setting Sensing Sensitivity: 1.4 mV
Zone Setting Status: 755011

## 2024-07-09 ENCOUNTER — Other Ambulatory Visit: Payer: Self-pay | Admitting: Family Medicine

## 2024-07-09 DIAGNOSIS — E785 Hyperlipidemia, unspecified: Secondary | ICD-10-CM

## 2024-07-09 NOTE — Progress Notes (Unsigned)
" °  Cardiology Office Note:   Date:  07/10/2024  ID:  Shawn [name], DOB 1935-06-04, MRN [id number] PCP: Katrinka Garnette KIDD, MD  [facility] Providers Cardiologist:  Lynwood Schilling, MD Electrophysiologist:  Danelle Birmingham, MD {  History of Present Illness:   Shawn [name] is a 89 y.o. male who presents for followup of lower extremity swelling.  He has chronic atrial fib.  He has CAD and pacemaker placement.  Since I last saw her she has done well.  The patient denies any new symptoms such as chest discomfort, neck or arm discomfort. There has been no new shortness of breath, PND or orthopnea. There have been no reported palpitations, presyncope or syncope.    He did fall back before Christmas and I reviewed these ER records.  He stepped off a curb he did not see.    ROS: As stated in the HPI and negative for all other systems.  Studies Reviewed:    EKG:   EKG Interpretation Date/Time:  Friday July 10 2024 15:14:20 EST Ventricular Rate:  84 PR Interval:    QRS Duration:  156 QT Interval:  404 QTC Calculation: 477 R Axis:   59  Text Interpretation: Atrial fibrillation VENTRICULAR PACING When compared with ECG of 07-May-2023 10:34, No significant change since last tracing Confirmed by Schilling Rattan ([id number]) on 07/10/2024 3:31:12 PM    Risk Assessment/Calculations:    CHA2DS2-VASc Score = 4   This indicates a 4.8% annual risk of stroke. The patient's score is based upon: CHF History: 0 HTN History: 1 Diabetes History: 0 Stroke History: 0 Vascular Disease History: 1 Age Score: 2 Gender Score: 0   Physical Exam:   VS:  BP (!) 106/50   Pulse 84   Ht 5' 7 (1.702 m)   Wt 195 lb 14.4 oz (88.9 kg)   SpO2 99%   BMI 30.68 kg/m    Wt Readings from Last 3 Encounters:  07/10/24 195 lb 14.4 oz (88.9 kg)  06/08/24 202 lb (91.6 kg)  05/12/24 202 lb 3.2 oz (91.7 kg)     GEN: Well nourished, well developed in no acute distress NECK: No JVD; No carotid bruits CARDIAC:  RRR, no murmurs, rubs, gallops RESPIRATORY:  Clear to auscultation without rales, wheezing or rhonchi  ABDOMEN: Soft, non-tender, non-distended EXTREMITIES: Moderate bilateral left greater than right lower extremity edema; No deformity   ASSESSMENT AND PLAN:   CHRONIC ATRIAL FIBRILLATION -  The patient tolerates anticoagulation.  I talked to him today about changing to Eliquis and he still does not want to do this.  He is quite comfortable with his warfarin and might consider it in the future if he knows the Eliquis has come down to $10 co-pay.   CAD, NATIVE VESSEL -  The patient has no new symptoms.  No change in therapy.   PACEMAKER, PERMANENT -  I reviewed this and he has about a 5 months longevity.  Follow-up with the pacemaker clinic.  HTN - His blood pressure is at target.  Continue the meds as listed.   EDEMA - He does have some moderate lower extremity swelling but he thinks this is at baseline.  No change in therapy.  Follow up with me in 12 months in [place]  Signed, Lynwood Schilling, MD   "

## 2024-07-10 ENCOUNTER — Ambulatory Visit: Admitting: Cardiology

## 2024-07-10 VITALS — BP 106/50 | HR 84 | Ht 67.0 in | Wt 195.9 lb

## 2024-07-10 DIAGNOSIS — I499 Cardiac arrhythmia, unspecified: Secondary | ICD-10-CM | POA: Insufficient documentation

## 2024-07-10 DIAGNOSIS — I495 Sick sinus syndrome: Secondary | ICD-10-CM | POA: Insufficient documentation

## 2024-07-10 DIAGNOSIS — I2510 Atherosclerotic heart disease of native coronary artery without angina pectoris: Secondary | ICD-10-CM

## 2024-07-10 DIAGNOSIS — I4891 Unspecified atrial fibrillation: Secondary | ICD-10-CM

## 2024-07-10 DIAGNOSIS — I10 Essential (primary) hypertension: Secondary | ICD-10-CM

## 2024-07-10 DIAGNOSIS — D126 Benign neoplasm of colon, unspecified: Secondary | ICD-10-CM | POA: Insufficient documentation

## 2024-07-10 DIAGNOSIS — C4490 Unspecified malignant neoplasm of skin, unspecified: Secondary | ICD-10-CM | POA: Insufficient documentation

## 2024-07-10 DIAGNOSIS — K5790 Diverticulosis of intestine, part unspecified, without perforation or abscess without bleeding: Secondary | ICD-10-CM | POA: Insufficient documentation

## 2024-07-10 DIAGNOSIS — M7989 Other specified soft tissue disorders: Secondary | ICD-10-CM

## 2024-07-10 DIAGNOSIS — R112 Nausea with vomiting, unspecified: Secondary | ICD-10-CM | POA: Insufficient documentation

## 2024-07-10 DIAGNOSIS — L02219 Cutaneous abscess of trunk, unspecified: Secondary | ICD-10-CM | POA: Insufficient documentation

## 2024-07-10 DIAGNOSIS — M1990 Unspecified osteoarthritis, unspecified site: Secondary | ICD-10-CM | POA: Insufficient documentation

## 2024-07-10 NOTE — Patient Instructions (Signed)

## 2024-08-01 ENCOUNTER — Ambulatory Visit

## 2024-08-03 ENCOUNTER — Encounter

## 2024-08-12 ENCOUNTER — Ambulatory Visit

## 2024-09-01 ENCOUNTER — Ambulatory Visit

## 2024-09-03 ENCOUNTER — Encounter

## 2024-11-24 ENCOUNTER — Ambulatory Visit

## 2024-12-07 ENCOUNTER — Encounter: Admitting: Family Medicine
